# Patient Record
Sex: Female | Born: 1945 | Race: White | Hispanic: No | Marital: Married | Smoking: Never smoker
Health system: Southern US, Community
[De-identification: ages and names within clinical notes are randomized; demographics above are authoritative.]

## PROBLEM LIST (undated history)

## (undated) ENCOUNTER — Emergency Department (HOSPITAL_COMMUNITY): Admission: EM | Payer: Medicare Other | Source: Home / Self Care

## (undated) DIAGNOSIS — E785 Hyperlipidemia, unspecified: Secondary | ICD-10-CM

## (undated) DIAGNOSIS — E119 Type 2 diabetes mellitus without complications: Secondary | ICD-10-CM

## (undated) DIAGNOSIS — K219 Gastro-esophageal reflux disease without esophagitis: Secondary | ICD-10-CM

## (undated) DIAGNOSIS — E039 Hypothyroidism, unspecified: Secondary | ICD-10-CM

## (undated) DIAGNOSIS — R42 Dizziness and giddiness: Secondary | ICD-10-CM

## (undated) DIAGNOSIS — J45909 Unspecified asthma, uncomplicated: Secondary | ICD-10-CM

## (undated) DIAGNOSIS — I340 Nonrheumatic mitral (valve) insufficiency: Secondary | ICD-10-CM

## (undated) DIAGNOSIS — I10 Essential (primary) hypertension: Secondary | ICD-10-CM

## (undated) DIAGNOSIS — R519 Headache, unspecified: Secondary | ICD-10-CM

## (undated) DIAGNOSIS — T8859XA Other complications of anesthesia, initial encounter: Secondary | ICD-10-CM

## (undated) DIAGNOSIS — M1990 Unspecified osteoarthritis, unspecified site: Secondary | ICD-10-CM

## (undated) DIAGNOSIS — F32A Depression, unspecified: Secondary | ICD-10-CM

## (undated) DIAGNOSIS — I341 Nonrheumatic mitral (valve) prolapse: Secondary | ICD-10-CM

## (undated) DIAGNOSIS — J189 Pneumonia, unspecified organism: Secondary | ICD-10-CM

## (undated) DIAGNOSIS — F419 Anxiety disorder, unspecified: Secondary | ICD-10-CM

## (undated) DIAGNOSIS — Z9989 Dependence on other enabling machines and devices: Secondary | ICD-10-CM

## (undated) DIAGNOSIS — K7200 Acute and subacute hepatic failure without coma: Secondary | ICD-10-CM

## (undated) DIAGNOSIS — T4145XA Adverse effect of unspecified anesthetic, initial encounter: Secondary | ICD-10-CM

## (undated) DIAGNOSIS — F329 Major depressive disorder, single episode, unspecified: Secondary | ICD-10-CM

## (undated) DIAGNOSIS — D500 Iron deficiency anemia secondary to blood loss (chronic): Secondary | ICD-10-CM

## (undated) DIAGNOSIS — I4819 Other persistent atrial fibrillation: Secondary | ICD-10-CM

## (undated) DIAGNOSIS — K759 Inflammatory liver disease, unspecified: Secondary | ICD-10-CM

## (undated) DIAGNOSIS — G4733 Obstructive sleep apnea (adult) (pediatric): Secondary | ICD-10-CM

## (undated) DIAGNOSIS — R51 Headache: Secondary | ICD-10-CM

## (undated) DIAGNOSIS — N179 Acute kidney failure, unspecified: Secondary | ICD-10-CM

## (undated) HISTORY — DX: Nonrheumatic mitral (valve) insufficiency: I34.0

## (undated) HISTORY — DX: Iron deficiency anemia secondary to blood loss (chronic): D50.0

## (undated) HISTORY — DX: Type 2 diabetes mellitus without complications: E11.9

## (undated) HISTORY — PX: FRACTURE SURGERY: SHX138

## (undated) HISTORY — PX: JOINT REPLACEMENT: SHX530

## (undated) HISTORY — DX: Acute kidney failure, unspecified: N17.9

## (undated) HISTORY — DX: Hyperlipidemia, unspecified: E78.5

## (undated) HISTORY — PX: ABDOMINAL HYSTERECTOMY: SHX81

## (undated) HISTORY — PX: CHOLECYSTECTOMY: SHX55

## (undated) HISTORY — DX: Other persistent atrial fibrillation: I48.19

---

## 1981-11-17 HISTORY — PX: PARTIAL HYSTERECTOMY: SHX80

## 1993-11-17 HISTORY — PX: GALLBLADDER SURGERY: SHX652

## 1998-03-02 ENCOUNTER — Ambulatory Visit (HOSPITAL_COMMUNITY): Admission: RE | Admit: 1998-03-02 | Discharge: 1998-03-02 | Payer: Self-pay | Admitting: Family Medicine

## 1998-03-07 ENCOUNTER — Other Ambulatory Visit: Admission: RE | Admit: 1998-03-07 | Discharge: 1998-03-07 | Payer: Self-pay | Admitting: Family Medicine

## 1998-05-31 ENCOUNTER — Ambulatory Visit (HOSPITAL_COMMUNITY): Admission: RE | Admit: 1998-05-31 | Discharge: 1998-05-31 | Payer: Self-pay | Admitting: Family Medicine

## 1998-06-12 ENCOUNTER — Encounter: Admission: RE | Admit: 1998-06-12 | Discharge: 1998-09-10 | Payer: Self-pay | Admitting: Family Medicine

## 1999-01-09 ENCOUNTER — Encounter: Payer: Self-pay | Admitting: Internal Medicine

## 1999-01-09 ENCOUNTER — Inpatient Hospital Stay (HOSPITAL_COMMUNITY): Admission: EM | Admit: 1999-01-09 | Discharge: 1999-01-11 | Payer: Self-pay | Admitting: Internal Medicine

## 1999-01-09 ENCOUNTER — Encounter: Payer: Self-pay | Admitting: Orthopedic Surgery

## 1999-01-09 ENCOUNTER — Encounter: Payer: Self-pay | Admitting: Otolaryngology

## 1999-02-28 ENCOUNTER — Other Ambulatory Visit: Admission: RE | Admit: 1999-02-28 | Discharge: 1999-02-28 | Payer: Self-pay | Admitting: Family Medicine

## 2000-02-10 ENCOUNTER — Encounter (INDEPENDENT_AMBULATORY_CARE_PROVIDER_SITE_OTHER): Payer: Self-pay

## 2000-02-10 ENCOUNTER — Ambulatory Visit (HOSPITAL_COMMUNITY): Admission: RE | Admit: 2000-02-10 | Discharge: 2000-02-10 | Payer: Self-pay | Admitting: Gastroenterology

## 2000-04-15 ENCOUNTER — Encounter: Admission: RE | Admit: 2000-04-15 | Discharge: 2000-04-15 | Payer: Self-pay | Admitting: Family Medicine

## 2000-04-15 ENCOUNTER — Encounter: Payer: Self-pay | Admitting: Family Medicine

## 2000-05-04 ENCOUNTER — Other Ambulatory Visit: Admission: RE | Admit: 2000-05-04 | Discharge: 2000-05-04 | Payer: Self-pay | Admitting: Family Medicine

## 2001-02-19 ENCOUNTER — Encounter: Admission: RE | Admit: 2001-02-19 | Discharge: 2001-02-19 | Payer: Self-pay | Admitting: Family Medicine

## 2001-02-19 ENCOUNTER — Encounter: Payer: Self-pay | Admitting: Family Medicine

## 2001-05-11 ENCOUNTER — Other Ambulatory Visit: Admission: RE | Admit: 2001-05-11 | Discharge: 2001-05-11 | Payer: Self-pay | Admitting: Family Medicine

## 2002-03-30 ENCOUNTER — Encounter: Payer: Self-pay | Admitting: Family Medicine

## 2002-03-30 ENCOUNTER — Encounter: Admission: RE | Admit: 2002-03-30 | Discharge: 2002-03-30 | Payer: Self-pay | Admitting: Family Medicine

## 2002-04-26 ENCOUNTER — Other Ambulatory Visit: Admission: RE | Admit: 2002-04-26 | Discharge: 2002-04-26 | Payer: Self-pay | Admitting: Family Medicine

## 2002-09-16 ENCOUNTER — Encounter: Admission: RE | Admit: 2002-09-16 | Discharge: 2002-09-16 | Payer: Self-pay | Admitting: Family Medicine

## 2002-09-16 ENCOUNTER — Encounter: Payer: Self-pay | Admitting: Family Medicine

## 2003-04-21 ENCOUNTER — Encounter: Admission: RE | Admit: 2003-04-21 | Discharge: 2003-04-21 | Payer: Self-pay | Admitting: Family Medicine

## 2003-04-21 ENCOUNTER — Encounter: Payer: Self-pay | Admitting: Family Medicine

## 2003-04-28 ENCOUNTER — Other Ambulatory Visit: Admission: RE | Admit: 2003-04-28 | Discharge: 2003-04-28 | Payer: Self-pay | Admitting: Family Medicine

## 2003-11-13 ENCOUNTER — Emergency Department (HOSPITAL_COMMUNITY): Admission: EM | Admit: 2003-11-13 | Discharge: 2003-11-13 | Payer: Self-pay | Admitting: Emergency Medicine

## 2003-11-14 ENCOUNTER — Ambulatory Visit (HOSPITAL_COMMUNITY): Admission: RE | Admit: 2003-11-14 | Discharge: 2003-11-14 | Payer: Self-pay | Admitting: Orthopedic Surgery

## 2003-11-18 HISTORY — PX: NASAL SINUS SURGERY: SHX719

## 2003-11-18 HISTORY — PX: THROAT SURGERY: SHX803

## 2004-02-12 ENCOUNTER — Ambulatory Visit (HOSPITAL_BASED_OUTPATIENT_CLINIC_OR_DEPARTMENT_OTHER): Admission: RE | Admit: 2004-02-12 | Discharge: 2004-02-12 | Payer: Self-pay | Admitting: Orthopedic Surgery

## 2004-02-12 ENCOUNTER — Ambulatory Visit (HOSPITAL_COMMUNITY): Admission: RE | Admit: 2004-02-12 | Discharge: 2004-02-12 | Payer: Self-pay | Admitting: Orthopedic Surgery

## 2004-04-26 ENCOUNTER — Encounter: Admission: RE | Admit: 2004-04-26 | Discharge: 2004-04-26 | Payer: Self-pay | Admitting: Family Medicine

## 2004-05-01 ENCOUNTER — Other Ambulatory Visit: Admission: RE | Admit: 2004-05-01 | Discharge: 2004-05-01 | Payer: Self-pay | Admitting: Family Medicine

## 2004-09-11 ENCOUNTER — Inpatient Hospital Stay (HOSPITAL_COMMUNITY): Admission: RE | Admit: 2004-09-11 | Discharge: 2004-09-16 | Payer: Self-pay | Admitting: Orthopedic Surgery

## 2004-09-11 ENCOUNTER — Ambulatory Visit: Payer: Self-pay | Admitting: Physical Medicine & Rehabilitation

## 2004-10-22 ENCOUNTER — Emergency Department (HOSPITAL_COMMUNITY): Admission: EM | Admit: 2004-10-22 | Discharge: 2004-10-22 | Payer: Self-pay | Admitting: Emergency Medicine

## 2005-04-30 ENCOUNTER — Encounter: Admission: RE | Admit: 2005-04-30 | Discharge: 2005-04-30 | Payer: Self-pay | Admitting: Family Medicine

## 2005-05-06 ENCOUNTER — Other Ambulatory Visit: Admission: RE | Admit: 2005-05-06 | Discharge: 2005-05-06 | Payer: Self-pay | Admitting: Family Medicine

## 2006-03-14 ENCOUNTER — Ambulatory Visit (HOSPITAL_BASED_OUTPATIENT_CLINIC_OR_DEPARTMENT_OTHER): Admission: RE | Admit: 2006-03-14 | Discharge: 2006-03-14 | Payer: Self-pay | Admitting: Otolaryngology

## 2006-03-22 ENCOUNTER — Ambulatory Visit: Payer: Self-pay | Admitting: Internal Medicine

## 2006-04-21 ENCOUNTER — Ambulatory Visit (HOSPITAL_COMMUNITY): Admission: RE | Admit: 2006-04-21 | Discharge: 2006-04-21 | Payer: Self-pay | Admitting: *Deleted

## 2006-05-06 ENCOUNTER — Ambulatory Visit (HOSPITAL_COMMUNITY): Admission: RE | Admit: 2006-05-06 | Discharge: 2006-05-07 | Payer: Self-pay | Admitting: Otolaryngology

## 2006-05-06 ENCOUNTER — Encounter (INDEPENDENT_AMBULATORY_CARE_PROVIDER_SITE_OTHER): Payer: Self-pay | Admitting: *Deleted

## 2006-06-05 ENCOUNTER — Encounter: Admission: RE | Admit: 2006-06-05 | Discharge: 2006-06-05 | Payer: Self-pay | Admitting: Family Medicine

## 2006-10-09 ENCOUNTER — Encounter: Admission: RE | Admit: 2006-10-09 | Discharge: 2006-10-09 | Payer: Self-pay | Admitting: Family Medicine

## 2006-12-01 ENCOUNTER — Encounter: Admission: RE | Admit: 2006-12-01 | Discharge: 2006-12-01 | Payer: Self-pay | Admitting: Family Medicine

## 2007-02-10 ENCOUNTER — Ambulatory Visit (HOSPITAL_COMMUNITY): Admission: RE | Admit: 2007-02-10 | Discharge: 2007-02-10 | Payer: Self-pay | Admitting: Orthopedic Surgery

## 2007-06-08 ENCOUNTER — Encounter: Admission: RE | Admit: 2007-06-08 | Discharge: 2007-06-08 | Payer: Self-pay | Admitting: Family Medicine

## 2007-11-03 ENCOUNTER — Encounter: Admission: RE | Admit: 2007-11-03 | Discharge: 2008-02-01 | Payer: Self-pay | Admitting: Family Medicine

## 2008-04-05 ENCOUNTER — Encounter: Admission: RE | Admit: 2008-04-05 | Discharge: 2008-04-05 | Payer: Self-pay | Admitting: Family Medicine

## 2008-06-08 ENCOUNTER — Ambulatory Visit (HOSPITAL_COMMUNITY): Admission: RE | Admit: 2008-06-08 | Discharge: 2008-06-08 | Payer: Self-pay | Admitting: Family Medicine

## 2008-08-23 ENCOUNTER — Encounter: Admission: RE | Admit: 2008-08-23 | Discharge: 2008-10-16 | Payer: Self-pay | Admitting: Family Medicine

## 2008-10-25 ENCOUNTER — Encounter: Admission: RE | Admit: 2008-10-25 | Discharge: 2008-10-25 | Payer: Self-pay | Admitting: Family Medicine

## 2009-06-11 ENCOUNTER — Encounter: Admission: RE | Admit: 2009-06-11 | Discharge: 2009-06-11 | Payer: Self-pay | Admitting: Family Medicine

## 2010-07-23 ENCOUNTER — Encounter: Admission: RE | Admit: 2010-07-23 | Discharge: 2010-07-23 | Payer: Self-pay | Admitting: Family Medicine

## 2011-01-27 ENCOUNTER — Other Ambulatory Visit: Payer: Self-pay | Admitting: Orthopedic Surgery

## 2011-01-27 DIAGNOSIS — M25572 Pain in left ankle and joints of left foot: Secondary | ICD-10-CM

## 2011-01-31 ENCOUNTER — Ambulatory Visit
Admission: RE | Admit: 2011-01-31 | Discharge: 2011-01-31 | Disposition: A | Discharge status: Home / Self Care | Payer: 59 | Source: Ambulatory Visit | Attending: Orthopedic Surgery | Admitting: Orthopedic Surgery

## 2011-01-31 DIAGNOSIS — M25572 Pain in left ankle and joints of left foot: Secondary | ICD-10-CM

## 2011-04-04 NOTE — Op Note (Signed)
NAME:  Amanda [name], Amanda [name]                         ACCOUNT NO.:  [id number]   MEDICAL RECORD NO.:  [id number]                   PATIENT TYPE:  AMB   LOCATION:  DSC                                  FACILITY:  [facility]   PHYSICIAN:  Feliberto Gottron. Turner Daniels, M.D.                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   PROCEDURE:  Left knee arthroscopic partial medial and lateral  meniscectomies, removal of numerous loose bodies and debridement of  chondromalacia.   SURGEON:  Feliberto Gottron. Turner Daniels, M.D.   FIRST ASSISTANT:  Skip Mayer, PA-C.   ANESTHESIA:  Local with IV sedation.   ESTIMATED BLOOD LOSS:  Minimal.   FLUIDS REPLACED:  One liter of crystalloid.   DRAINS:  Placed.   TOURNIQUET TIME:  None.   INDICATIONS FOR PROCEDURE:  The patient is a 65 year old woman with sharp  popping, catching and extreme pain in her left knee.  Back in January she  stood up and had extreme pain in her left knee with popping and pain.  She  had a Doppler done for DVT's and had an MRI scan which showed  tricompartmental degenerative changes mostly in the patellofemoral joint  space as well as a small collection of fluid above the knee.  Because of  persistent mechanical symptoms she is taken for arthroscopic evaluation and  treatment of her right knee.  Most of her pain is along the medial  compartment.   DESCRIPTION OF PROCEDURE:  The patient was identified by her arm band and  taken to the operating room at [facility], the  appropriate  anesthetic monitors were attached and local anesthesia with IV sedation was  induced into the left knee.  A lateral post was applied to the table and the  left lower extremity prepped and draped in the usual sterile fashion from  the ankle to the mid thigh.  Using a #11 blade, standard inferomedial and  inferolateral parapatellar portals were then made allowing introduction of  the arthroscope through the inferolateral portal and the outflow in through  the inferomedial portal.  Grade 2 chondromalacia of the patella was noted,  grade 3 focal of the trochlea and this was debrided back to a stable margin  with a 3.5 Gator sucker shaver.  Multiple cartilaginous loose bodies were  noted to be floating around in the synovial fluid and these were taken  through a large outflow intermittently during the case.  Moving into the  medial compartment, minimal tearing of the medial meniscus was noted  primarily degenerative and this was debrided back to a stable margin.  Grade  3 chondromalacia of the medial femoral condyle and medial tibial condyle was  also noted and debrided.  The ACL and the PCL were intact on the lateral  side.  The lateral tibial plateau near the spine was grade 4 chondromalacia  and grade 3 global to the lateral tibial condyle. A flap tear of the  posterior horn of the lateral meniscus was noted and debrided with a 3.5  Gator sucker shaver as well as a straight biters.  Again, more cartilaginous  loose bodies were noted and removed and photographic documentation was made  of this.  The gutters were cleared.  The scope was taken medial and lateral  to the PCL clearing the posterior compartment.  The knee was irrigated out  with normal saline solution.  The arthroscopic instruments were removed and  a dressing of Xeroform, 4 x 4 dressing sponges, Webril and an ace wrap were  applied.  The patient was then awakened and taken to the recovery room  without  difficulty.                                               Feliberto Gottron. Turner Daniels, M.D.    Ovid Curd  D:  02/12/2004  T:  02/12/2004  Job:  [id number]

## 2011-04-04 NOTE — Op Note (Signed)
NAMESELDA, JALBERT               ACCOUNT NO.:  [id number]   MEDICAL RECORD NO.:  [id number]          PATIENT TYPE:  INP   LOCATION:  NA                           FACILITY:  [facility]   PHYSICIAN:  Feliberto Gottron. Turner Daniels, M.D.   DATE OF BIRTH:  12-17-1945   DATE OF PROCEDURE:  DATE OF DISCHARGE:                                 OPERATIVE REPORT   Audio too short to transcribe (less than 5 seconds)       FJR/MEDQ  D:  09/11/2004  T:  09/11/2004  Job:  [id number]

## 2011-04-04 NOTE — Op Note (Signed)
Amanda [name], Amanda [name] NO.:  [id number]   MEDICAL RECORD NO.:  [id number]          PATIENT TYPE:  OIB   LOCATION:  2550                         FACILITY:  [facility]   PHYSICIAN:  Hermelinda Medicus, M.D.   DATE OF BIRTH:  02/01/1946   DATE OF PROCEDURE:  05/06/2006  DATE OF DISCHARGE:                                 OPERATIVE REPORT   PREOPERATIVE DIAGNOSES:  1.  Sleep apnea with a septal deviation.  2.  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   POSTOPERATIVE DIAGNOSIS:  1.  Sleep apnea with a septal deviation.  2.  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   OPERATION:  A septal reconstruction, turbinate reduction and a laser  assisted uvulopalatoplasty.   ANESTHESIA:  Local anesthesia MAC.   SURGEON:  Dr. Hermelinda Medicus,   DESCRIPTION OF PROCEDURE:  Patient placed in supine position and under local  MAC anesthesia, 1% Xylocaine with epinephrine 6 mL and topical cocaine 200  mg, after prepping and draping, the nose was first approached under local  anesthesia.  Once this was completed a hemitransfixion incision was made on  the right side, carried around the columella to the left and back along the  floor of the nose where the Glorious Peach was used to remove a strip of cartilage  that was essentially off the premaxillary crest to the left side.  Once this  was removed, the septum was brought back to the midline in this region.  It  extended back vomerine septum and a 4-mm chisel was used as well as the open  and close Laren Boom to correct this.  Attention was then carried to  the posterior quadrilateral cartilage superiorly where a strip of cartilage  was taken more superiorly than the ethmoid septum which was off to the right  side was then corrected by using the open and close Laren Boom.  Once  the ethmoid septum was corrected, then the septum was established  in the  midline and closure was begun using 5-0 plain catgut and through-and-through  septal suture using four plain x2.  The Elmed set at 12 was then used to  cauterize the inferolateral portion of the inferior turbinates in an effort  to shrink the membrane which was quite edematous.  The inferior turbinates  were also outfractured aggressively to make sure we could gain all the space  we possibly could in this narrow nose.  Once this was completed, then the  oxygen was discontinued and the oral cavity was visualized and the 1%  Xylocaine with epinephrine was used to anesthetize the palate as well as  topical lidocaine.  Once this was completed, then the glasses were applied.  The patient's face and eyes were totally protected with wet sponges and  towel and the lesion was set at 10 watts for 5 minutes continuous.  Lateral  superior laser cuts were made each side of the uvula and once these  incisions were made, then the redundant  uvula was also corrected by again  using the laser.  A laser was used for 5 minutes.  The patient tolerated the  procedure very well.  Airway was markedly improved and then on the right  side we placed a Merocel pack 8 cm and on the left side we placed a 7.5  anesthesia trumpet.  The patient tolerated procedure well and was doing well  postoperatively.   FOLLOW UP:  She will be kept overnight for observation for monitoring of her  sleep status and then follow-up will be in 5 days, 2 weeks, 4 weeks and 6  weeks and then 3 months and 6 months.           ______________________________  Hermelinda Medicus, M.D.     JC/MEDQ  D:  05/06/2006  T:  05/06/2006  Job:  [id number]   cc:   Quita Skye. Artis Flock, M.D.  Fax: [phone]   Elmore Guise., M.D.  Fax: [phone]

## 2011-04-04 NOTE — H&P (Signed)
NAMEGAY, MONCIVAIS NO.:  [id number]   MEDICAL RECORD NO.:  [id number]          PATIENT TYPE:  OIB   LOCATION:  2550                         FACILITY:  [facility]   PHYSICIAN:  Hermelinda Medicus, M.D.   DATE OF BIRTH:  1946-09-10   DATE OF ADMISSION:  05/06/2006  DATE OF DISCHARGE:                                HISTORY & PHYSICAL   TWENTY-THREE-HOUR OBSERVATION NOTE:   HISTORY OF PRESENT ILLNESS:  This patient is a 65 year old female who has  had persistent sleep apnea issues.  She has got a very narrow nose,  turbinate hypertrophy with very poor nasal breathing and has a very low  uvula and palate.  She has also been seen under my care for persistent  sinusitis problems, but after treatment with full rounds of antibiotics and  decongestants including Avelox and Levaquin, she had a negative CT scan, but  did show the septal deviation blocking her nose.  Her sleep study reveals a  RDI of 55 and the lowest O2 nadir was 85%.  She will not work with a CPAP,  says she cannot work with it and now enters for a septal reconstruction and  turbinate reduction with an LAUP planned under local anesthesia.  She has  had a complete evaluation of her cardiovascular status and it is found to be  in excellent condition.  She ended up with a abnormal stress test with  reversible anterior wall defect; therefore, she had a cardiac  catheterization which essentially the left main was normal.  Their  impression was normal-appearing coronary arteries, normal left ventricular  systolic function with ejection fraction of 55% to 60% and a left-dominant  system.  Once she was cleared for surgery, she now enters for a septal  reconstruction and turbinate reduction with a laser-assisted  uvulopalatoplasty.   MEDICATIONS:  Her medications are listed and are:  1.  Toprol-XL 50 mg half a tablet daily.  2.  Altace 2.5 mg daily.  3.  Avandia 8 mg daily.  4.  Hydrochlorothiazide 25 mg daily.  5.   Crestor 10 mg.  6.  Premarin 0.625 mg.  7.  Paxil CR 25 mg.  8.  Zyrtec 10 mg.  9.  Multivitamins.  10. Glucosamine chondroitin two pills.  11. Tylenol No. 3 p.r.n.  12. Tramadol 15 mg p.r.n. daily.   PAST HISTORY:   ALLERGIES:  Essentially GI intolerance -- MACRODANTIN, E-MYCIN and ASA and  NONSTEROIDAL ANTI-INFLAMMATORIES.   PAST SURGICAL HISTORY:  Her surgery has been one of hysterectomy, left TKA  in October 2005 and a broken leg treated at [facility] in 2000.  She also  had a laparoscopic cholecystectomy.   MEDICAL ILLNESSES:  She had diabetes diagnosed in 1999, hiatal hernia with  reflux, I am sure aggravated by sleep apnea and she works with elderly  patient and there is a concern of a staphylococcal infection.   PHYSICAL EXAMINATION:  VITAL SIGNS:  Blood pressure of 133/73, pulse 68.  She weighs 271, is 5 feet 4-1/2.  HEENT:  Her ears are clear.  Tympanic membranes are clear.  Her nose has  severe septal deviation off the premaxillary crest the left and then in the  ethmoid, back to the right.  Her oral cavity is very small.  Her tonsils are  small, but her uvula is large and her palate is extremely low and her  oropharynx is shallow and narrow.  Larynx is clear, however, with true  cords, false cords, epiglottis and base of tongue clear.  True cord  mobility, gag reflex intact, but EOMs and facial nerve are all symmetrical.  NECK:  Her neck is free of any thyromegaly, cervical adenopathy or mass.  CHEST:  Clear.  No rales, rhonchi or wheezes.  CARDIOVASCULAR:  No opening snaps, murmurs or gallops.  ABDOMEN:  Unremarkable.  EXTREMITIES:  Above-mentioned surgeries.   INITIAL DIAGNOSES:  1.  Sleep apnea with septal deviation, with intolerance of continuous      positive airway pressure.  2.  History of a clear cardiac catheterization with a right-sided dominance      of 55% to 60% ejection fraction.  3.  Left total knee arthroplasty in October 2005.  4.  Fractured  leg in 2000.  5.  Laparoscopic cholecystectomy and hysterectomy.  6.  Intolerance to Macrodantin, erythromycin and aspirin and nonsteroidal      antiinflammatories.  7.  Diabetes, on diabetic diet.           ______________________________  Hermelinda Medicus, M.D.     JC/MEDQ  D:  05/06/2006  T:  05/06/2006  Job:  [id number]   cc:   Elmore Guise., M.D.  Fax: [phone]   Quita Skye. Artis Flock, M.D.  Fax: [phone]

## 2011-04-04 NOTE — Cardiovascular Report (Signed)
[name], [name] NO.:  [id number]   MEDICAL RECORD NO.:  [id number]          PATIENT TYPE:  OIB   LOCATION:  2899                         FACILITY:  [facility]   PHYSICIAN:  Elmore Guise., M.D.DATE OF BIRTH:  1946/10/21   DATE OF PROCEDURE:  04/21/2006  DATE OF DISCHARGE:  04/21/2006                              CARDIAC CATHETERIZATION   INDICATIONS FOR PROCEDURE:  Preoperative evaluation and abnormal stress test  with a reversible anterior wall defect.   DESCRIPTION OF PROCEDURE:  The patient was brought to the cardiac  catheterization lab after appropriate informed consent.  She was prepped and  draped in sterile fashion.  Approximately 25 mL of 1% lidocaine was used for  local anesthesia.  Her right femoral artery was difficult to access.  The  right femoral vein was accessed, and a 5-French sheath was placed in the  vein as a landmark.  A Smart needle was used to access the right femoral  artery which showed good pulsatile flow.  A 6-French sheath was then placed  in the right femoral artery without difficulty.  Coronary angiography, LV  angiography, limited right femoral angiography were then performed.  The  patient tolerated the procedure well.  No apparent complications.  She was  transferred from the cardiac catheterization lab in stable condition.   FINDINGS:  1.  Left Main:  Normal.  2.  LAD:  Mild luminal irregularities.  3.  Diagonal-1:  Moderate size with mild luminal irregularities.  4.  Left circumflex:  Dominant with mild luminal irregularities.  5.  OM-1/OM-2/OM-3:  Moderate to large vessels with mild luminal      irregularities.  6.  Ramus intermedius:  Moderate to large vessel with mild luminal      irregularities.  7.  RCA:  A small nondominant with mild luminal irregularities.  8.  LV:  EF of 55-60%.  No wall motion abnormalities.  LV EDP was 21 mmHg.  9.  Limited right femoral angiogram showed no significant disease. Closure  device was not deployed because of multiple attempts at access.   IMPRESSION:  1.  Normal-appearing coronary arteries.  2.  Normal left ventricular systolic function with an ejection fraction of      55-60%.  3.  Left dominant system.   PLAN:  Aggressive medical therapy.  No further cardiac workup needed prior  to her upcoming surgery.  She is moderate risk by her comorbidities alone.  However, she has normal-appearing coronaries and should tolerate the  procedure well.      Elmore Guise., M.D.  Electronically Signed     TWK/MEDQ  D:  04/21/2006  T:  04/21/2006  Job:  [id number]   cc:   Hermelinda Medicus, M.D.  Fax: [phone]

## 2011-04-04 NOTE — Procedures (Signed)
NAME:  Amanda [name], Amanda [name] NO.:  [id number]   MEDICAL RECORD NO.:  [id number]          PATIENT TYPE:  OUT   LOCATION:  SLEEP CENTER                 FACILITY[facility]  [facility]   PHYSICIAN:  Clinton D. Maple Hudson, M.D. DATE OF BIRTH:  [birth year]   DATE OF STUDY:  03/14/2006                              NOCTURNAL POLYSOMNOGRAM   REFERRING PHYSICIAN:  Dr. Hermelinda Medicus.   INDICATIONS FOR STUDY:  Insomnia with sleep apnea.   EPWORTH SLEEPINESS SCORE:  11/24, BMI 44.  Weight 260 pounds.   MEDICATIONS:  Toprol XL, Altace, Avandia, hydrochlorothiazide, Crestor,  Premarin, Paxil, Zyrtec, Tylenol #3 or tramadol.   SLEEP ARCHITECTURE:  Total sleep time 303 minutes with sleep efficiency 76%.  Stage 1 was 19%, stage 2 66%, stages 3 and 4, 4%, REM 11% of total sleep  time.  Sleep latency 43 minutes, REM latency 318 minutes, awake after sleep  onset 51 minutes, arousal index 24.5.   RESPIRATORY DATA:  NPSG protocol ordered.  Apnea/hypopnea index (AHI, RDI)  55 obstructive events per hour indicating severe obstructive sleep  apnea/hypopnea syndrome.  This included 111 obstructive apneas and 167  hypopneas.  Events were not positional.  REM AHI 57.4 per hour.   OXYGEN DATA:  Mild to moderate snoring with oxygen desaturation to a nadir  of 85%.  Mean oxygen saturation through the study was 93% on room air.   CARDIAC DATA:  Normal sinus rhythm.   MOVEMENT/PARASOMNIA:  Frequent limb jerks but few were associated with  arousal or awakening.   IMPRESSION/RECOMMENDATIONS:  1.  Severe obstructive sleep apnea/hypopnea syndrome, AHI 55 per hour with      nonpositional events, mild to moderate snoring and oxygen desaturation      to a nadir of 85%.  2.  Periodic limb movement was noted but with relatively few associated      arousals, 1.6 per hour.  3.  Consider return for CPAP titration or evaluate for alternative therapies      as appropriate.      Clinton D. Maple Hudson, M.D.  Diplomate,  Biomedical engineer of Sleep Medicine  Electronically Signed     CDY/MEDQ  D:  03/22/2006 10:52:01  T:  03/23/2006 12:36:14  Job:  [id number]

## 2011-04-04 NOTE — Op Note (Signed)
NAMEMILLEY, VINING NO.:  [id number]   MEDICAL RECORD NO.:  [id number]          PATIENT TYPE:  INP   LOCATION:  2899                         FACILITY:  [facility]   PHYSICIAN:  Feliberto Gottron. Turner Daniels, M.D.   DATE OF BIRTH:  01/07/1946   DATE OF PROCEDURE:  09/11/2004  DATE OF DISCHARGE:                                 OPERATIVE REPORT   PREOPERATIVE DIAGNOSIS:  Degenerative arthritis of the left knee, proven by  arthroscopy.   POSTOPERATIVE DIAGNOSIS:  Degenerative arthritis of the left knee, proven by  arthroscopy.   PROCEDURE:  Left total knee arthroplasty using Osteonics Scorpio components:  7 femur, 5 tibia, 12 spacer, 5 modified medial patellar button; all  components cemented, Palacos cement, double batch with 1500 mg of Zinacef.   SURGEON:  Feliberto Gottron. Turner Daniels, M.D.   FIRST ASSISTANT:  __________ Almira Bar.   ANESTHETIC:  General endotracheal.   ESTIMATED BLOOD LOSS:  Minimal.   FLUID REPLACEMENT:  A liter of crystalloids.   DRAINS:  2 Hemovacs and a Foley catheter.   URINE OUTPUT:  300 cc.   TOURNIQUET TIME:  1 hour and 20 minutes.   INDICATIONS FOR PROCEDURE:  Patient is a 65 year old woman with end-stage  arthritis of the left knee.  She has undergone conservative treatment with  antiinflammatory medicines, physical therapy, cortisone injections and most  recently arthroscopy which documented grade 4 changes to the medial and  patellofemoral compartments of her left knee.  Because of unremitting pain  she desires total knee arthroplasty.  She is well aware of the risk and  benefits of surgery.   DESCRIPTION OF PROCEDURE:  The patient identified by arm band, taken to the  operating room at [facility].  Appropriate and __________ monitors  were attached and general endotracheal anesthesia induced with the patient  in the supine position.  Tourniquet applied high to the left thigh and the  left lower extremity prepped and draped in the usual  sterile fashion from  the ankle to the tourniquet after placement of a foot positioner under the  lateral toes.  Limb wrapped with an esmarch bandage, tourniquet inflated to  400 mmHg.  Anterior midline incision then made starting 10 cm above the  patella and going to 8 cm below the patella.  Bleeders in the skin and  subcutaneous tissue identified and cauterized.  Medial parapatellar  arthrotomy accomplished after reflecting the transverse retinaculum medially  and laterally.  Because of the patient's obesity the patella was everted and  tucked underneath the adipose tissue laterally and the knee hyperflexed  exposing the prepatellar fat pad which was resected as well as the anterior  half of the medial and lateral meniscus.  The superficial medial-collateral  ligament was elevated from anterior-to-posterior going around the proximal  tibia and leaving it intact distally allowing placement of a posterior  medial Z-retractor.   The ACL and the PCL removed from the notch with the electrocautery and a  McHale retractor placed translating the tibia anteriorly and finally a  lateral Hohmann retractor was placed exposing the proximal tibia which  was  then instrumented proximally with the Osteonic step drill in the center  followed by an intramedullary rod and a 0-degree posterior slip cutting  guide.  Standard tibial cut was then accomplished after pinning the guide in  place allowing removal of about 7 mm of bone medially and 10 mm of bone and  cartilage laterally.  Satisfied with the proximal tibia resection we then  directed our attention to the distal femur and I entered it 2 mm anterior to  the PCL origin with the step drill followed by the IM rod and the 5-degree  left distal femoral cutting guide.   Having accomplished the distal femoral cut, at 10 mm after pinning the guide  into place, we then sized for a #7 femoral component and pinned the cutting  guide in place in 0-degrees of  rotation.  The anterior, posterior, and  champher cuts were then accomplished without difficulty followed by the  Osteonics, Scorpio box cut.  The everted patella was then grasped with the  patellar cutting guide; the posterior 9 mm resected.  We sized for a #5,  modified medial patellar button with a 5 lollipop and drilled the patella.  We then performed a trial with a 7 left femoral component.  A 5 tibial  baseplate with 10 and 12-mm trial spacers.  The 12 had the best fit and  ligamentous tension.  We set the rotation of the tibial baseplate so that it  was over the second metatarsal and scored the proximal tibia.  This was done  with the electrocautery. The trial patellar button was placed; the knee  taken through a range of motion and no lateral tracking noted; and the knee  was quite stable.   At this point the knee was, once again, flexed up and the trial components  removed except for the tibial baseplate which was then pinned in the correct  rotation for the best coverage over the tibia, followed by the delto fit  keel punches up to a 35 cemented delto fit keel. At this point all bony  surfaces were waterpicked clean, dried with suction and sponges and a double  bath of Palacos polymethyl methacrylate cement with 1500 mg of Zinacef was  then mixed and applied to all bony and metallic surfaces except for the  posterior condyles of the femur itself.  In order we hammered into place a 5  tibial baseplate, a 7 left femoral component, and a 5 modified medial  patellar button and excess cement was removed after each component was  placed.   The knee was then reduced to 0 degrees and held at 5 degrees flexion with  compression.  A clamp was placed on the patella and the cement allowed to  secure.  Medium Hemovacs were placed laterally in the wound.  Patellar clamp  was removed.  We once again checked stability to look for any small bits of cement that could be removed from the  periphery of the implants and the  wound once again, irrigated out with normal saline solution.  Parapatellar  arthrotomy was then closed with running #1 Vicryl suture.  The subcutaneous  tissue with #0 and 2-0 undyed Vicryl suture and the skin with skin staples.  A dressing of Xeroform, 4 x 4 dressing, sponges, Webril, and an Ace wrap  were applied.  The tourniquet was let down.  The patient awakened and taken  to the recovery room without difficulty.      Drenda Freeze  FJR/MEDQ  D:  09/11/2004  T:  09/11/2004  Job:  [id number]

## 2011-04-04 NOTE — H&P (Signed)
NAMETENELLE, ANDREASON NO.:  [id number]   MEDICAL RECORD NO.:  [id number]           PATIENT TYPE:   LOCATION:                                 FACILITY:   PHYSICIAN:  Elmore Guise., M.D.DATE OF BIRTH:  1945/12/29   DATE OF ADMISSION:  04/21/2006  DATE OF DISCHARGE:                                HISTORY & PHYSICAL   INDICATION FOR ADMISSION:  Abnormal stress test for elective cardiac  catheterization with possible intervention.   HISTORY OF PRESENT ILLNESS:  The patient is a very pleasant 65 year old  white female with a past medical history of diabetes mellitus, hypertension,  dyslipidemia, obesity, and osteoarthritis (status post left total knee  replacement) who presented for preoperative evaluation initially on Apr 06, 2006.  At that time, the patient reports normal state of health, however,  she was having increasing difficulty with chronic fatigue, sleep problems,  and dyspnea.  She was being worked up for obstructive sleep apnea and was  found to have significant nasal obstruction and was planned to have nasal  and soft palate surgery.  She reports that she had just been too short of  breath to anything.  She denied any chest pain, presyncope or syncope.  No  palpitations.  She states her weight has been stable but heavy for some  time.  In her preoperative workup, she had an echocardiogram which showed a  normal LV systolic function with an EF of 55-60%.  She had trileaflet aortic  valve, with mild aortic valve sclerosis, and trivial aortic valve  insufficiency.  She had mild diastolic dysfunction and mild mitral and  trivial tricuspid valve regurgitation.  This was followed by an adenosine  Cardiolite which showed a reversible anterior wall defect with normal LV  systolic function and EF of 60%.   Her current medications include:  1.  Toprol XL 50 mg, a 1/2 p.o. daily.  2.  Altace 2.5 mg daily.  3.  Avandia 8 mg daily.  4.  Hydrochlorothiazide  25 mg daily.  5.  Crestor 10 mg daily.  6.  Premarin 0.625 mg daily.  7.  Paxil CR 25 mg daily.  8.  Zyrtec 10 mg daily.  9.  Multivitamin once daily.  10. Glucosamine chondroitin 2 pills daily.  11. Tylenol #3 p.r.n.  12. Tramadol 50 mg p.r.n.   ALLERGIES:  1.  ERYTHROMYCIN.  2.  MACRODANTIN.  3.  NONSTEROIDAL'S.   Questionable past allergy to ASPIRIN causing problems with ulcers.Marland Kitchen   FAMILY HISTORY:  Is positive for stroke, diabetes, hypertension, mitral  valve prolapse.   SOCIAL HISTORY:  She is married.  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  Exercises by treading water approximately twice a week.  No tobacco, no  alcohol does drink four caffeinated beverages daily.   PAST SURGICAL HISTORY:  1.  Total hysterectomy.  2.  Left total knee replacement.  3.  Right leg surgery secondary to a fracture.  4.  A cholecystectomy in the past.   PHYSICAL EXAMINATION:  VITAL SIGNS:  Her weight is 271 pounds.  Her  blood  pressure is 120/70.  Her heart rate is 65 and regular.  GENERAL:  She is a very pleasant, middle-aged, white female alert and  oriented  x4 in no acute distress.  NECK:  Supple.  No lymphadenopathy.  Carotids 2+.  No JVD and no bruits.  LUNGS:  Clear.  HEART:  Regular with a normal S1-S2.  ABDOMEN:  Soft, nontender, nondistended.  No rebound or guarding.  EXTREMITIES:  Warm with trace pretibial edema and 2+ pulses noted in her  femoral and pedal arteries.   Her blood work today showed a PT of 12.2, INR of 0.9, PTT of 33.6.  Her BUN  and creatinine are 21 and 0.8 with a potassium of 4.2.  Her blood count  shows a white blood cell count of 5.6, hemoglobin of 12.2, and platelet  count of 279.  Her stress test did show a reversible anterior wall defect  with normal LV function.   IMPRESSION:  History of multiple cardiac risk factors now with abnormal  stress test for preoperative evaluation with continued dyspnea.   PLAN:  1.  I discussed risks and benefits of cardiac  catheterization with possible      intervention with her at length.  2.  She is on an aspirin 81 mg daily and seems to be tolerating that very      well.  3.  I have asked her to stop her Avandia at this time.  4.  Should we have to do stenting to any of her arteries, her surgery would      be delayed at least 1 month, preferably up to 3 months.  She understands      this and is willing to proceed.   Her procedure is scheduled for June5, 2007.  She is to notify should she  have any problems prior to that time.  I did give her samples of Actos to  take instead of Avandia because of the recent data regarding difficulty with  increased risk of heart attack and death with Avandia.      Elmore Guise., M.D.  Electronically Signed     TWK/MEDQ  D:  04/17/2006  T:  04/17/2006  Job:  [id number]   cc:   Hermelinda Medicus, M.D.  Fax: [phone]

## 2011-04-04 NOTE — Discharge Summary (Signed)
[name], BAUMBACH NO.:  [id number]   MEDICAL RECORD NO.:  [id number]          PATIENT TYPE:  INP   LOCATION:  4705                         FACILITY:  [facility]   PHYSICIAN:  Feliberto Gottron. Turner Daniels, M.D.   DATE OF BIRTH:  July 02, 1966   DATE OF ADMISSION:  09/11/2004  DATE OF DISCHARGE:  09/16/2004                                 DISCHARGE SUMMARY   PRIMARY DIAGNOSIS FOR THIS ADMISSION:  End-stage degenerative joint disease  of the left knee.   PROCURE IN-HOSPITAL:  Left total knee arthroplasty.   SECONDARY DIAGNOSES:  1.  Hypertension.  2.  Diabetes.  3.  Depression.  4.  Anemia.  5.  Peptic ulcer disease.   HISTORY OF PRESENT ILLNESS:  Patient is a 65 year old woman with end-stage  osteoarthritis of the left knee. She had undergo conservative treatment with  anti-inflammatory medications, physical therapy, cortisone injections and  most recently arthroscopy which documented  grade 4 changes in the medial  patellofemoral compartment of the left knee. Because of unremitting pain,  she desires total knee arthroplasty. She is well aware of the risks and  benefits prior to surgery.   ALLERGIES:  ASPIRIN, ERYTHROMYCIN, MACRODANTIN.   MEDICATIONS ON ADMISSION:  1.  Toprol  XL 50 mg 1/2 pill daily.  2.  Altace 2.5 mg daily.  3.  Avandia 8 mg 1 p.o. daily.  4.  Hydrochlorothiazide 25 mg 1/2 tablet p.o.       daily.  5.  Lipitor 10 mg 1 p.o. daily.  6.  Premarin 0.625 mg p.o. daily.  7.  Paxil CR 25 mg 1 p.o. daily.  8.  Multivitamins.  9.  Occasional Tylenol No. 3 or Xanax.  10. Rarely takes doxazosin 500 mg.   PAST MEDICAL HISTORY:  Usual childhood diseases.  Adult:  Hypertension,  diabetes, depression, anemia, peptic ulcer disease.   PAST SURGICAL HISTORY:  Knee scoped 2005.  ORIF of the leg 2000. Gallbladder  in '95.  Hysterectomy in 1983. __________   SOCIAL HISTORY:  No tobacco, no ethanol, no IV drug abuse.  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   FAMILY HISTORY:  Father died at age 24 secondary to CVA and diabetes.  Mother alive at age 78 with history of hypertension, DJD.   REVIEW OF SYSTEMS:  Positive for glasses, GI irregularity.  Denies any  shortness of breath, chest pain or recent illness.   PHYSICAL EXAMINATION:  VITAL SIGNS:  Temperature 95.6, pulse 70,  respirations 16, blood pressure 130/82.  Patient is 5 feet, 4-1/2, 254  pounds.  HEENT:  Head is normocephalic, atraumatic. Ears: TMs clear.  Eyes:  Pupils  are equal, round and reactive to light.  Nose and throat:  Benign.  NECK:  Supple, full range of motion.  LUNGS:  Clear to auscultation and auscultation and percussion.  CARDIOVASCULAR:  Regular rate and rhythm.  ABDOMEN:  Soft, no masses, bowel sounds 2+.  EXTREMITIES:  Within normal limits with the exception of left knee which has  a 1+ crepitus, range of motion 0-100 degrees, neurovascularly intact.  SKIN:  Well-healed normal scars following arthroscopy.   X-rays show loss of articular cartilage and grade 4 by arthroscopy pictures.   LABORATORY DATA:  Preoperative labs including CBC, CMET, chest x-ray, EKG,  PT/PTT are all within normal limits with the exception of hemoglobin 11.8,  hematocrit 34.7, glucose 108.   HOSPITAL COURSE:  On the day of admission, the patient was taken to the  Operating Room at [facility] where she underwent a left total knee  arthroplasty using Osteonics __________  #7 femur, #5 tibia, 12 mm spacer,  #5 modified medial patellar button, Palacos cement double batch with 1500 mg  Ancef embedded. Medium Hemovac was placed, double-arm into the knee.  Foley  catheter was placed perioperatively, she was placed on perioperative  antibiotics, she was placed on postoperative Coumadin prophylaxis with a  target INR of 1.52.  Physical therapy was begun the day of surgery with CPM.   Postoperative day #1, patient was awake, somewhat sleepy from medication.  No nausea or vomiting. She was  using her CPM without difficulty. She was  afebrile, vital signs stable.  PT 12.8. Urine output 400 cc, drain output 50  cc, then discontinued. She was neurovascularly intact distally.  She began  ambulating with physical therapy.  Postoperative day #2, patient was  complaining of severe pain for progress for PT secondary to pain, positive  nausea and vomiting. She was afebrile, vital signs were stable, hemoglobin  9.9 and WBC 10.8, INR 1.5, urine output 100 cc q.shift. CBGs ranged from 155-  220. Dressing was dry, calf was moderately edematous, she was otherwise  stable.  She continued physical therapy. Rehab consult was obtained and  physical therapy was continued.   The patient was seen in consultation by rehab medicine and they felt that  she might be possible rehab admit. On postoperative day #3, the patient was  complaining of moderate pain but improving.  She was afebrile, hemoglobin  9.9, INR 2.5.  Calf with moderate edema, nontender, dressing was dry.  PCO  was discontinued.  Physical therapy continued, slow to make progress.  Postoperative day #4, the patient was without complaints, she was out of bed  to the room, not yet out the hall.  Hemoglobin 9.4, INR 2.1. Dressing was  dry, staples intact.  She continued physical therapy.  Postoperative day #5,  patient was progressing well, and was discharged home to the care of her  family, after passing physical therapy goals of independent ambulation.   She will be on Coumadin x2 weeks postoperatively per home health agency  through [facility].   ACTIVITY:  Weightbearing as tolerated.   DIET:  Regular.   WOUND CARE:  Dressing change daily.   FINAL DIAGNOSIS:  End-stage osteoarthritis of the left knee.   CONDITION ON DISCHARGE:  Medically stable and improved.   DISCHARGE MEDICATIONS:  1.  Tylox.  2.  Coumadin.  Resume home medications.      Amanda [name]  D:  11/27/2004  T:  11/27/2004  Job:  [id number]

## 2011-04-12 ENCOUNTER — Inpatient Hospital Stay (INDEPENDENT_AMBULATORY_CARE_PROVIDER_SITE_OTHER)
Admission: RE | Admit: 2011-04-12 | Discharge: 2011-04-12 | Disposition: A | Discharge status: Home / Self Care | Payer: Medicare PPO | Source: Ambulatory Visit | Attending: Emergency Medicine | Admitting: Emergency Medicine

## 2011-04-12 DIAGNOSIS — B029 Zoster without complications: Secondary | ICD-10-CM

## 2011-04-17 ENCOUNTER — Emergency Department (HOSPITAL_COMMUNITY)
Admission: EM | Admit: 2011-04-17 | Discharge: 2011-04-17 | Disposition: A | Discharge status: Home / Self Care | Payer: Medicare PPO | Attending: Emergency Medicine | Admitting: Emergency Medicine

## 2011-04-17 DIAGNOSIS — F341 Dysthymic disorder: Secondary | ICD-10-CM | POA: Insufficient documentation

## 2011-04-17 DIAGNOSIS — E785 Hyperlipidemia, unspecified: Secondary | ICD-10-CM | POA: Insufficient documentation

## 2011-04-17 DIAGNOSIS — E119 Type 2 diabetes mellitus without complications: Secondary | ICD-10-CM | POA: Insufficient documentation

## 2011-04-17 DIAGNOSIS — I10 Essential (primary) hypertension: Secondary | ICD-10-CM | POA: Insufficient documentation

## 2011-04-17 DIAGNOSIS — B029 Zoster without complications: Secondary | ICD-10-CM | POA: Insufficient documentation

## 2011-04-17 DIAGNOSIS — K219 Gastro-esophageal reflux disease without esophagitis: Secondary | ICD-10-CM | POA: Insufficient documentation

## 2011-04-17 DIAGNOSIS — Z79899 Other long term (current) drug therapy: Secondary | ICD-10-CM | POA: Insufficient documentation

## 2011-04-17 DIAGNOSIS — R51 Headache: Secondary | ICD-10-CM | POA: Insufficient documentation

## 2011-07-02 ENCOUNTER — Other Ambulatory Visit: Payer: Self-pay | Admitting: Family Medicine

## 2011-07-02 DIAGNOSIS — Z1231 Encounter for screening mammogram for malignant neoplasm of breast: Secondary | ICD-10-CM

## 2011-07-31 ENCOUNTER — Ambulatory Visit: Payer: 59

## 2011-08-14 ENCOUNTER — Ambulatory Visit
Admission: RE | Admit: 2011-08-14 | Discharge: 2011-08-14 | Disposition: A | Discharge status: Home / Self Care | Payer: Medicare Other | Source: Ambulatory Visit | Attending: Family Medicine | Admitting: Family Medicine

## 2011-08-14 DIAGNOSIS — Z1231 Encounter for screening mammogram for malignant neoplasm of breast: Secondary | ICD-10-CM

## 2011-09-23 ENCOUNTER — Other Ambulatory Visit: Payer: Self-pay

## 2011-09-23 ENCOUNTER — Encounter (HOSPITAL_COMMUNITY): Payer: Self-pay | Admitting: *Deleted

## 2011-09-23 ENCOUNTER — Emergency Department (HOSPITAL_COMMUNITY)
Admission: EM | Admit: 2011-09-23 | Discharge: 2011-09-24 | Disposition: A | Discharge status: Home / Self Care | Payer: Medicare PPO | Attending: Emergency Medicine | Admitting: Emergency Medicine

## 2011-09-23 DIAGNOSIS — R269 Unspecified abnormalities of gait and mobility: Secondary | ICD-10-CM | POA: Insufficient documentation

## 2011-09-23 DIAGNOSIS — H5500 Unspecified nystagmus: Secondary | ICD-10-CM | POA: Insufficient documentation

## 2011-09-23 DIAGNOSIS — R5381 Other malaise: Secondary | ICD-10-CM | POA: Insufficient documentation

## 2011-09-23 DIAGNOSIS — I10 Essential (primary) hypertension: Secondary | ICD-10-CM

## 2011-09-23 DIAGNOSIS — R42 Dizziness and giddiness: Secondary | ICD-10-CM | POA: Insufficient documentation

## 2011-09-23 DIAGNOSIS — R51 Headache: Secondary | ICD-10-CM

## 2011-09-23 DIAGNOSIS — Z79899 Other long term (current) drug therapy: Secondary | ICD-10-CM | POA: Insufficient documentation

## 2011-09-23 DIAGNOSIS — I498 Other specified cardiac arrhythmias: Secondary | ICD-10-CM | POA: Insufficient documentation

## 2011-09-23 HISTORY — DX: Essential (primary) hypertension: I10

## 2011-09-23 NOTE — ED Provider Notes (Signed)
History     CSN: [id number] Arrival date & time: 09/23/2011  6:55 PM   First MD Initiated Contact with Patient 09/23/11 2302      Chief Complaint  Patient presents with  . Hypertension    (Consider location/radiation/quality/duration/timing/severity/associated sxs/prior treatment) Patient is a 65 y.o. female presenting with hypertension. The history is provided by the patient.  Hypertension This is a recurrent problem. Episode onset: 2 weeks ago. Associated symptoms include headaches. Pertinent negatives include no chest pain, no abdominal pain and no shortness of breath.   patient states that the last 2 weeks she's been having high blood pressure and dizziness. She states that she's has trouble walking and has put her hand, also shows a fall. She states her blood pressure has been up to 180 systolic. She states she saw her primary care provider today who told her she needed tocome to the hospital by ambulance. She states that she is under a lot of stress because she is taking care of her own husband. She was not able to come right away. She states that where blood pressure is high she has a headache and feels a rushing in her head. She states she gets left-sided headache. The headache is not necessarily associated with the dizziness. She states that besides the dizziness and continued to work properly. She states that she was told to have had a TIA.  Past Medical History  Diagnosis Date  . Hypertension     History reviewed. No pertinent past surgical history.  History reviewed. No pertinent family history.  History  Substance Use Topics  . Smoking status: Never Smoker   . Smokeless tobacco: Not on file  . Alcohol Use: No    OB History    Grav Para Term Preterm Abortions TAB SAB Ect Mult Living                  Review of Systems  Constitutional: Positive for fatigue. Negative for activity change and appetite change.  HENT: Negative for neck stiffness.   Eyes: Negative for  pain.  Respiratory: Negative for chest tightness and shortness of breath.   Cardiovascular: Negative for chest pain and leg swelling.  Gastrointestinal: Negative for nausea, vomiting, abdominal pain and diarrhea.  Genitourinary: Negative for flank pain.  Musculoskeletal: Positive for gait problem. Negative for back pain.  Skin: Negative for rash.  Neurological: Positive for dizziness and headaches. Negative for tremors, syncope, weakness and numbness.  Psychiatric/Behavioral: Negative for behavioral problems.    Allergies  Aciphex; Erythromycin; Macrodantin; Omeprazole-sodium bicarbonate; and Sulfa antibiotics  Home Medications   Current Outpatient Rx  Name Route Sig Dispense Refill  . BUPROPION HCL ER (XL) 300 MG PO TB24 Oral Take 300 mg by mouth daily.      Marland Kitchen VITAMIN D3 1000 UNITS PO CAPS Oral Take 1 capsule by mouth daily.      Marland Kitchen CLONAZEPAM 0.5 MG PO TABS Oral Take 0.5 mg by mouth 3 (three) times daily as needed. For anxiety     . LEVOTHYROXINE SODIUM 25 MCG PO TABS Oral Take 25 mcg by mouth daily.      Marland Kitchen METFORMIN HCL 500 MG PO TABS Oral Take 500 mg by mouth daily.      Marland Kitchen METOPROLOL TARTRATE 50 MG PO TABS Oral Take 25 mg by mouth 2 (two) times daily.      Marland Kitchen OMEPRAZOLE 40 MG PO CPDR Oral Take 40 mg by mouth daily.      Marland Kitchen RAMIPRIL 2.5 MG  PO CAPS Oral Take 2.5 mg by mouth daily.      Marland Kitchen SIMVASTATIN 10 MG PO TABS Oral Take 10 mg by mouth at bedtime.      . TRAZODONE HCL 50 MG PO TABS Oral Take 50-100 mg by mouth at bedtime.      Marland Kitchen HYDROCHLOROTHIAZIDE 12.5 MG PO CAPS Oral Take 12.5 mg by mouth daily.      Marland Kitchen HYDROCODONE-ACETAMINOPHEN 5-500 MG PO TABS Oral Take 1 tablet by mouth 2 (two) times daily as needed. For pain       BP 131/71  Pulse 58  Temp(Src) 97.9 F (36.6 C) (Oral)  Resp 12  SpO2 100%  Physical Exam  Nursing note and vitals reviewed. Constitutional: She is oriented to person, place, and time. She appears well-developed and well-nourished.  HENT:  Head:  Normocephalic and atraumatic.  Eyes: EOM are normal. Pupils are equal, round, and reactive to light.  Neck: Normal range of motion. Neck supple.  Cardiovascular: Normal rate, regular rhythm and normal heart sounds.   No murmur heard. Pulmonary/Chest: Effort normal and breath sounds normal. No respiratory distress. She has no wheezes. She has no rales.  Abdominal: Soft. Bowel sounds are normal. She exhibits no distension. There is no tenderness. There is no rebound and no guarding.  Musculoskeletal: Normal range of motion.  Neurological: She is alert and oriented to person, place, and time. No cranial nerve deficit. Coordination normal.       Finger-nose intact bilateral and no Romberg.  Some mild nystagmus at end gaze laterally. Extraocular movements intact. Normal ambulation -6 slightly unsteady gait from that she says is from her left knee.  Skin: Skin is warm and dry.  Psychiatric: She has a normal mood and affect. Her speech is normal.    ED Course  Procedures (including critical care time)  Labs Reviewed  CBC - Abnormal; Notable for the following:    Hemoglobin 11.4 (*)    HCT 34.7 (*)    All other components within normal limits  COMPREHENSIVE METABOLIC PANEL - Abnormal; Notable for the following:    Glucose, Bld 118 (*)    Albumin 3.4 (*)    GFR calc non Af Amer 52 (*)    GFR calc Af Amer 60 (*)    All other components within normal limits  URINALYSIS, ROUTINE W REFLEX MICROSCOPIC - Abnormal; Notable for the following:    Leukocytes, UA SMALL (*)    All other components within normal limits  URINE MICROSCOPIC-ADD ON - Abnormal; Notable for the following:    Squamous Epithelial / LPF MANY (*)    All other components within normal limits  DIFFERENTIAL   Ct Head Wo Contrast  09/24/2011  *RADIOLOGY REPORT*  Clinical Data: Hypertension, headache, dizziness.  CT HEAD WITHOUT CONTRAST  Technique:  Contiguous axial images were obtained from the base of the skull through the vertex  without contrast.  Comparison: 10/09/2006  Findings: There is no evidence for acute hemorrhage, hydrocephalus, mass lesion, or abnormal extra-axial fluid collection.  No definite CT evidence for acute infarction.  The visualized paranasal sinuses and mastoid air cells are predominately clear.  IMPRESSION: No acute intracranial abnormality.  Original Report Authenticated By: Waneta Martins, M.D.     1. Headache   2. Hypertension      Date: 09/24/2011  Rate: 56  Rhythm: sinus bradycardia  QRS Axis: right  Intervals: normal  ST/T Wave abnormalities: normal  Conduction Disutrbances:none  Narrative Interpretation:   Old EKG Reviewed:  unchanged    MDM  Patient has had high blood pressure and headache for the last couple weeks. States that she feels a whooshing in her head with it. She says that she has also had episodes of unsteadiness. She's not unsteady now. Her head CT shows no infarct at this time. Her blood pressure is mildly elevated here. She will need a TIA workup, would rather have it done as an outpatient. She has a primary care doctor to followup with [place]. She'll be discharged home        Juliet Rude. Rubin Payor, MD 09/24/11 [phone]

## 2011-09-23 NOTE — ED Notes (Signed)
The pt has had high bp for the past week and she has been under a lot  Of stress caring for a chronically ill husband

## 2011-09-23 NOTE — ED Notes (Signed)
Pt amiable, pleasant, alert, interactive,calm,skin W&D, resps e/u, NAD, speech clear, updated, family at [facility], CBIR, bed in low position, reports "wanting something for HA".

## 2011-09-24 ENCOUNTER — Emergency Department (HOSPITAL_COMMUNITY): Payer: Medicare PPO

## 2011-09-24 LAB — COMPREHENSIVE METABOLIC PANEL
ALT: 10 U/L (ref 0–35)
Alkaline Phosphatase: 67 U/L (ref 39–117)
BUN: 12 mg/dL (ref 6–23)
Chloride: 100 mEq/L (ref 96–112)
GFR calc Af Amer: 60 mL/min — ABNORMAL LOW (ref >90)
GFR calc non Af Amer: 52 mL/min — ABNORMAL LOW (ref >90)
Glucose, Bld: 118 mg/dL — ABNORMAL HIGH (ref 70–99)
Total Bilirubin: 0.3 mg/dL (ref 0.3–1.2)

## 2011-09-24 LAB — URINALYSIS, ROUTINE W REFLEX MICROSCOPIC
Bilirubin Urine: NEGATIVE
Ketones, ur: NEGATIVE mg/dL
Nitrite: NEGATIVE
Protein, ur: NEGATIVE mg/dL
Urobilinogen, UA: 0.2 mg/dL (ref 0.0–1.0)

## 2011-09-24 LAB — CBC
HCT: 34.7 % — ABNORMAL LOW (ref 36.0–46.0)
Hemoglobin: 11.4 g/dL — ABNORMAL LOW (ref 12.0–15.0)
MCH: 28.8 pg (ref 26.0–34.0)
MCHC: 32.9 g/dL (ref 30.0–36.0)
MCV: 87.6 fL (ref 78.0–100.0)
Platelets: 298 10*3/uL (ref 150–400)
RBC: 3.96 MIL/uL (ref 3.87–5.11)
RDW: 12.8 % (ref 11.5–15.5)
WBC: 8.7 10*3/uL (ref 4.0–10.5)

## 2011-09-24 LAB — URINE MICROSCOPIC-ADD ON

## 2011-09-24 LAB — DIFFERENTIAL
Basophils Absolute: 0.1 10*3/uL (ref 0.0–0.1)
Basophils Relative: 1 % (ref 0–1)
Eosinophils Absolute: 0.3 10*3/uL (ref 0.0–0.7)
Eosinophils Relative: 3 % (ref 0–5)
Monocytes Absolute: 0.8 10*3/uL (ref 0.1–1.0)
Neutro Abs: 5.4 10*3/uL (ref 1.7–7.7)

## 2011-09-24 MED ORDER — PROMETHAZINE HCL 25 MG/ML IJ SOLN
12.5000 mg | Freq: Once | INTRAMUSCULAR | Status: AC
  Administered 2011-09-24: 12.5 mg via INTRAVENOUS
  Filled 2011-09-24: qty 1

## 2011-09-24 NOTE — ED Notes (Signed)
Attempted IV x2 (unsuccessful), EDP aware, EDP will go and see pt, pending new orders & re-eval. Pt states, HA unchanged, hungry and thirsty, calm, NAD, alert, friend at [facility].

## 2011-09-24 NOTE — ED Notes (Signed)
Alert, amiable, inteactive, NAD, calm, "ready to go", getting dressed. Friend at [facility].

## 2011-09-24 NOTE — ED Notes (Signed)
No changes, here tonight for HA, reports HA, dizziness &  "whooshing in head", intermittantly x1-2 weeks, comes and goes, (denies: nvd, fever, cough, congestion, cold sx), LS CTA, MAEx4, radial pulses equal and strong, PERRL 3mm brisk. Pending lab results, waiting for CT.

## 2011-09-24 NOTE — ED Notes (Signed)
No changes, out with family to d/c desk, out in w/c by EMT, alert, NAD, calm, "feels better", denies sx questions concerns or needs unmet.

## 2012-05-13 ENCOUNTER — Other Ambulatory Visit: Payer: Self-pay | Admitting: Orthopedic Surgery

## 2012-05-13 DIAGNOSIS — M25561 Pain in right knee: Secondary | ICD-10-CM

## 2012-05-13 DIAGNOSIS — R531 Weakness: Secondary | ICD-10-CM

## 2012-05-13 DIAGNOSIS — M2390 Unspecified internal derangement of unspecified knee: Secondary | ICD-10-CM

## 2012-05-17 ENCOUNTER — Ambulatory Visit
Admission: RE | Admit: 2012-05-17 | Discharge: 2012-05-17 | Disposition: A | Discharge status: Home / Self Care | Payer: Medicare PPO | Source: Ambulatory Visit | Attending: Orthopedic Surgery | Admitting: Orthopedic Surgery

## 2012-05-17 DIAGNOSIS — M25561 Pain in right knee: Secondary | ICD-10-CM

## 2012-05-17 DIAGNOSIS — M2390 Unspecified internal derangement of unspecified knee: Secondary | ICD-10-CM

## 2012-05-17 DIAGNOSIS — R531 Weakness: Secondary | ICD-10-CM

## 2012-08-17 ENCOUNTER — Other Ambulatory Visit: Payer: Self-pay | Admitting: Family Medicine

## 2012-08-17 DIAGNOSIS — Z1231 Encounter for screening mammogram for malignant neoplasm of breast: Secondary | ICD-10-CM

## 2012-08-18 ENCOUNTER — Ambulatory Visit
Admission: RE | Admit: 2012-08-18 | Discharge: 2012-08-18 | Disposition: A | Discharge status: Home / Self Care | Payer: Medicare PPO | Source: Ambulatory Visit | Attending: Family Medicine | Admitting: Family Medicine

## 2012-08-18 DIAGNOSIS — Z1231 Encounter for screening mammogram for malignant neoplasm of breast: Secondary | ICD-10-CM

## 2013-07-25 ENCOUNTER — Other Ambulatory Visit: Payer: Self-pay

## 2013-07-25 DIAGNOSIS — Z1231 Encounter for screening mammogram for malignant neoplasm of breast: Secondary | ICD-10-CM

## 2013-08-19 ENCOUNTER — Ambulatory Visit
Admission: RE | Admit: 2013-08-19 | Discharge: 2013-08-19 | Disposition: A | Discharge status: Home / Self Care | Payer: Medicare Other | Source: Ambulatory Visit

## 2013-08-19 DIAGNOSIS — Z1231 Encounter for screening mammogram for malignant neoplasm of breast: Secondary | ICD-10-CM

## 2013-08-30 ENCOUNTER — Ambulatory Visit (INDEPENDENT_AMBULATORY_CARE_PROVIDER_SITE_OTHER): Payer: Medicare Other | Admitting: Internal Medicine

## 2013-08-30 ENCOUNTER — Encounter: Payer: Self-pay | Admitting: Internal Medicine

## 2013-08-30 VITALS — BP 112/68 | HR 57 | Temp 97.6°F | Ht 64.5 in | Wt 216.4 lb

## 2013-08-30 DIAGNOSIS — R05 Cough: Secondary | ICD-10-CM

## 2013-08-30 DIAGNOSIS — R059 Cough, unspecified: Secondary | ICD-10-CM

## 2013-08-30 MED ORDER — PREDNISONE (PAK) 10 MG PO TABS: ORAL_TABLET | ORAL | Status: DC

## 2013-08-30 NOTE — Progress Notes (Signed)
  Subjective:    Patient ID: Amanda [name], female    DOB: [birth year]   MRN: [id number]  HPI  89 yowf never smoker with lots of bronchitis as child but not on any maint inhalers but coughed most days but had good ex tol = swimming and biking never outgrew tendency to cough eval by allergist and ENT doctors = [place] on shots did great x some sinus infections and no need for maint meds but stopped in the 1990's   08/30/2013 1st [facility] office visit/ Vincy Feliz cc cough worse x 4 years since husband dementia x 3 years daily cough typically after stirring in am but also some while sleeping > min white mucus.  As long as not coughing breathing ok. Cough meds help some.  Prednisone helped some while on it.  Albuterol makes it worse.  Neb helped a little.  Was on ACEi stopped ? when  Lost 50 lb since lost husband  No obvious day to day or daytime variabilty or   cp or chest tightness, subjective wheeze overt sinus or hb symptoms. No unusual exp hx or h/o childhood pna/ asthma or knowledge of premature birth.  Sleeping ok without nocturnal  or early am exacerbation  of respiratory  c/o's or need for noct saba. Also denies any obvious fluctuation of symptoms with weather or environmental changes or other aggravating or alleviating factors except as outlined above   Current Medications, Allergies, Complete Past Medical History, Past Surgical History, Family History, and Social History were reviewed in [employer] record.           Review of Systems  Constitutional: Negative for fever and unexpected weight change.  HENT: Negative for congestion, dental problem, ear pain, nosebleeds, postnasal drip, rhinorrhea, sinus pressure, sneezing, sore throat and trouble swallowing.   Eyes: Negative for redness and itching.  Respiratory: Positive for cough and shortness of breath. Negative for chest tightness and wheezing.   Cardiovascular: Negative for palpitations and leg swelling.   Gastrointestinal: Negative for nausea and vomiting.  Genitourinary: Negative for dysuria.  Musculoskeletal: Negative for joint swelling.  Skin: Negative for rash.  Neurological: Negative for headaches.  Hematological: Does not bruise/bleed easily.  Psychiatric/Behavioral: Positive for dysphoric mood. The patient is nervous/anxious.        Objective:   Physical Exam  Somber wf nad Wt Readings from Last 3 Encounters:  08/30/13 216 lb 6.4 oz (98.158 kg)      HEENT: nl dentition, turbinates, and orophanx. Nl external ear canals without cough reflex   NECK :  without JVD/Nodes/TM/ nl carotid upstrokes bilaterally   LUNGS: no acc muscle use, clear to A and P bilaterally without cough on insp or exp maneuvers   CV:  RRR  no s3 or murmur or increase in P2, no edema   ABD:  soft and nontender with nl excursion in the supine position. No bruits or organomegaly, bowel sounds nl  MS:  warm without deformities, calf tenderness, cyanosis or clubbing  SKIN: warm and dry without lesions    NEURO:  alert, approp, no deficits    cxr in [facility] first oct 2014 reported to pt as nl       Assessment & Plan:

## 2013-08-30 NOTE — Patient Instructions (Addendum)
Prilosec 40 mg Take 30-60 min before first meal of the day and pepcid 20 mg at bedtime along with chlortrimeton 4 mg at bedtime  Stop zyrtec  Prednisone 10 mg take  4 each am x 2 days,   2 each am x 2 days,  1 each am x 2 days and stop   Take delsym two tsp every 12 hours and supplement if needed with vicodin  up to 2 every 4 hours to suppress the urge to cough. Swallowing water or using ice chips/non mint and menthol containing candies (such as lifesavers or sugarless jolly ranchers) are also effective.  You should rest your voice and avoid activities that you know make you cough.  Once you have eliminated the cough for 3 straight days try reducing the vicodin first,  then the delsym as tolerated.    GERD (REFLUX)  is an extremely common cause of respiratory symptoms, many times with no significant heartburn at all.    It can be treated with medication, but also with lifestyle changes including avoidance of late meals, excessive alcohol, smoking cessation, and avoid fatty foods, chocolate, peppermint, colas, red wine, and acidic juices such as orange juice.  NO MINT OR MENTHOL PRODUCTS SO NO COUGH DROPS  USE SUGARLESS CANDY INSTEAD (jolley ranchers or Stover's)  NO OIL BASED VITAMINS - use powdered substitutes.  Please schedule a follow up office visit in 4 weeks, sooner if needed with all medications in hand

## 2013-08-31 ENCOUNTER — Encounter: Payer: Self-pay | Admitting: Internal Medicine

## 2013-08-31 DIAGNOSIS — R05 Cough: Secondary | ICD-10-CM | POA: Insufficient documentation

## 2013-08-31 NOTE — Assessment & Plan Note (Signed)
The most common causes of chronic cough in immunocompetent adults include the following: upper airway cough syndrome (UACS), previously referred to as postnasal drip syndrome (PNDS), which is caused by variety of rhinosinus conditions; (2) asthma; (3) GERD; (4) chronic bronchitis from cigarette smoking or other inhaled environmental irritants; (5) nonasthmatic eosinophilic bronchitis; and (6) bronchiectasis.   These conditions, singly or in combination, have accounted for up to 94% of the causes of chronic cough in prospective studies.   Other conditions have constituted no >6% of the causes in prospective studies These have included bronchogenic carcinoma, chronic interstitial pneumonia, sarcoidosis, left ventricular failure, ACEI-induced cough, and aspiration from a condition associated with pharyngeal dysfunction.    Chronic cough is often simultaneously caused by more than one condition. A single cause has been found from 38 to 82% of the time, multiple causes from 18 to 62%. Multiply caused cough has been the result of three diseases up to 42% of the time.      This is almost certainly  Classic Upper airway cough syndrome, so named because it's frequently impossible to sort out how much is  CR/sinusitis with freq throat clearing (which can be related to primary GERD)   vs  causing  secondary (" extra esophageal")  GERD from wide swings in gastric pressure that occur with throat clearing, often  promoting self use of mint and menthol lozenges that reduce the lower esophageal sphincter tone and exacerbate the problem further in a cyclical fashion.   These are the same pts (now being labeled as having "irritable larynx syndrome" by some cough centers) who not infrequently have a history of having failed to tolerate ace inhibitors,  dry powder inhalers or biphosphonates or report having atypical reflux symptoms that don't respond to standard doses of PPI , and are easily confused as having aecopd or  asthma flares by even experienced allergists/ pulmonologists.  For now rx with max gerd rx / cyclical cough regimen  then regroup

## 2013-09-27 ENCOUNTER — Encounter: Payer: Self-pay | Admitting: Internal Medicine

## 2013-09-27 ENCOUNTER — Ambulatory Visit (INDEPENDENT_AMBULATORY_CARE_PROVIDER_SITE_OTHER): Payer: Medicare Other | Admitting: Internal Medicine

## 2013-09-27 VITALS — BP 120/70 | HR 70 | Temp 97.7°F | Ht 64.0 in | Wt 231.0 lb

## 2013-09-27 DIAGNOSIS — R059 Cough, unspecified: Secondary | ICD-10-CM

## 2013-09-27 DIAGNOSIS — R05 Cough: Secondary | ICD-10-CM

## 2013-09-27 NOTE — Patient Instructions (Signed)
No change in regimen through the holidays but after the first of the year you may be able to modify the acid medications > defer to Dr Hal Hope   If you are satisfied with your treatment plan let your doctor know and he/she can either refill your medications or you can return here when your prescription runs out.     If in any way you are not 100% satisfied,  please tell us.  If 100% better, tell your friends!

## 2013-09-27 NOTE — Progress Notes (Signed)
Subjective:    Patient ID: Amanda [name], female    DOB: 1945/12/20   MRN: [id number]   Brief patient profile:  34 yowf never smoker with lots of bronchitis as child but not on any maint inhalers but coughed most days but had good ex tol = swimming and biking never outgrew tendency to cough eval by allergist and ENT doctors = [place] on shots did great x some sinus infections and no need for maint meds but stopped in the 1990's    History of Present Illness  08/30/2013 1st [facility] office visit/ Amanda [name] cc cough worse x 4 years since husband dementia x 3 years daily cough typically after stirring in am but also some while sleeping > min white mucus.  As long as not coughing breathing ok. Cough meds help some.  Prednisone helped some while on it.  Albuterol makes it worse.  Neb helped a little.  Was on ACEi stopped ? when Lost 50 lb since lost mother rec Prilosec 40 mg Take 30-60 min before first meal of the day and pepcid 20 mg at bedtime along with chlortrimeton 4 mg at bedtime Stop zyrtec Prednisone 10 mg take  4 each am x 2 days,   2 each am x 2 days,  1 each am x 2 days and stop  Take delsym two tsp every 12 hours and supplement if needed with vicodin  up to 2 every 4 hours    09/27/2013 f/u ov/Amanda [name] re: cough x 3 years Chief Complaint  Patient presents with  . Follow-up    Pt states that her cough is much improved, no new co's today.   no longer needing any cough meds,  No obvious day to day or daytime variabilty or sob or  cp or chest tightness, subjective wheeze overt sinus or hb symptoms. No unusual exp hx or h/o childhood pna/ asthma or knowledge of premature birth.   Sleeping ok without nocturnal  or early am exacerbation  of respiratory  c/o's or need for noct saba. Also denies any obvious fluctuation of symptoms with weather or environmental changes or other aggravating or alleviating factors except as outlined above   Current Medications, Allergies, Complete Past  Medical History, Past Surgical History, Family History, and Social History were reviewed in [employer] record.  ROS  The following are not active complaints unless bolded sore throat, dysphagia, dental problems, itching, sneezing,  nasal congestion or excess/ purulent secretions, ear ache,   fever, chills, sweats, unintended wt loss, pleuritic or exertional cp, hemoptysis,  orthopnea pnd or leg swelling, presyncope, palpitations, heartburn, abdominal pain, anorexia, nausea, vomiting, diarrhea  or change in bowel or urinary habits, change in stools or urine, dysuria,hematuria,  rash, arthralgias, visual complaints, headache, numbness weakness or ataxia or problems with walking or coordination,  change in mood/affect or memory.                     Objective:   Physical Exam  Somber wf nad, a bit more hopeful  Wt Readings from Last 3 Encounters:  09/27/13 231 lb (104.781 kg)  08/30/13 216 lb 6.4 oz (98.158 kg)         HEENT: nl dentition, turbinates, and orophanx. Nl external ear canals without cough reflex   NECK :  without JVD/Nodes/TM/ nl carotid upstrokes bilaterally   LUNGS: no acc muscle use, clear to A and P bilaterally without cough on insp or exp maneuvers   CV:  RRR  no s3 or murmur or increase in P2, no edema   ABD:  soft and nontender with nl excursion in the supine position. No bruits or organomegaly, bowel sounds nl  MS:  warm without deformities, calf tenderness, cyanosis or clubbing      cxr in [facility] first oct 2014 reported to pt as nl       Assessment & Plan:

## 2013-09-27 NOTE — Assessment & Plan Note (Signed)
-   acei (ramapril)  stopped around 08/01/13    Classic Upper airway cough syndrome, so named because it's frequently impossible to sort out how much is  CR/sinusitis with freq throat clearing (which can be related to primary GERD)   vs  causing  secondary (" extra esophageal")  GERD from wide swings in gastric pressure that occur with throat clearing, often  promoting self use of mint and menthol lozenges that reduce the lower esophageal sphincter tone and exacerbate the problem further in a cyclical fashion.   These are the same pts (now being labeled as having "irritable larynx syndrome" by some cough centers) who not infrequently have a history of having failed to tolerate ace inhibitors,  dry powder inhalers or biphosphonates or report having atypical reflux symptoms that don't respond to standard doses of PPI , and are easily confused as having aecopd or asthma flares by even experienced allergists/ pulmonologists.   Much better but not clear whether the acei caused the cough or the acid reflux caused the acei intolerance at this point so rec Leave off acei indefinitely and then taper off the acid rx per Dr Wendee Copp discretion p  the holiday season.  Pulmonary f/u is prn

## 2014-07-19 ENCOUNTER — Other Ambulatory Visit: Payer: Self-pay

## 2014-07-19 DIAGNOSIS — Z1231 Encounter for screening mammogram for malignant neoplasm of breast: Secondary | ICD-10-CM

## 2014-08-09 ENCOUNTER — Other Ambulatory Visit (HOSPITAL_COMMUNITY): Payer: Self-pay | Admitting: Orthopedic Surgery

## 2014-08-22 ENCOUNTER — Encounter (INDEPENDENT_AMBULATORY_CARE_PROVIDER_SITE_OTHER): Payer: Self-pay

## 2014-08-22 ENCOUNTER — Ambulatory Visit
Admission: RE | Admit: 2014-08-22 | Discharge: 2014-08-22 | Disposition: A | Discharge status: Home / Self Care | Payer: Medicare Other | Source: Ambulatory Visit

## 2014-08-22 DIAGNOSIS — Z1231 Encounter for screening mammogram for malignant neoplasm of breast: Secondary | ICD-10-CM

## 2014-08-30 ENCOUNTER — Encounter (HOSPITAL_COMMUNITY): Payer: Self-pay

## 2014-08-31 ENCOUNTER — Encounter (HOSPITAL_COMMUNITY): Payer: Self-pay

## 2014-08-31 ENCOUNTER — Encounter (HOSPITAL_COMMUNITY)
Admission: RE | Admit: 2014-08-31 | Discharge: 2014-08-31 | Disposition: A | Discharge status: Home / Self Care | Payer: Medicare Other | Source: Ambulatory Visit | Attending: Orthopedic Surgery | Admitting: Orthopedic Surgery

## 2014-08-31 DIAGNOSIS — K219 Gastro-esophageal reflux disease without esophagitis: Secondary | ICD-10-CM | POA: Diagnosis not present

## 2014-08-31 DIAGNOSIS — I341 Nonrheumatic mitral (valve) prolapse: Secondary | ICD-10-CM | POA: Diagnosis not present

## 2014-08-31 DIAGNOSIS — M129 Arthropathy, unspecified: Secondary | ICD-10-CM | POA: Diagnosis not present

## 2014-08-31 DIAGNOSIS — I10 Essential (primary) hypertension: Secondary | ICD-10-CM | POA: Insufficient documentation

## 2014-08-31 DIAGNOSIS — G4733 Obstructive sleep apnea (adult) (pediatric): Secondary | ICD-10-CM | POA: Insufficient documentation

## 2014-08-31 DIAGNOSIS — E119 Type 2 diabetes mellitus without complications: Secondary | ICD-10-CM | POA: Insufficient documentation

## 2014-08-31 DIAGNOSIS — Z01818 Encounter for other preprocedural examination: Secondary | ICD-10-CM | POA: Diagnosis present

## 2014-08-31 DIAGNOSIS — E039 Hypothyroidism, unspecified: Secondary | ICD-10-CM | POA: Diagnosis not present

## 2014-08-31 HISTORY — DX: Depression, unspecified: F32.A

## 2014-08-31 HISTORY — DX: Nonrheumatic mitral (valve) prolapse: I34.1

## 2014-08-31 HISTORY — DX: Major depressive disorder, single episode, unspecified: F32.9

## 2014-08-31 HISTORY — DX: Dizziness and giddiness: R42

## 2014-08-31 HISTORY — DX: Headache, unspecified: R51.9

## 2014-08-31 HISTORY — DX: Unspecified osteoarthritis, unspecified site: M19.90

## 2014-08-31 HISTORY — DX: Pneumonia, unspecified organism: J18.9

## 2014-08-31 HISTORY — DX: Hypothyroidism, unspecified: E03.9

## 2014-08-31 HISTORY — DX: Inflammatory liver disease, unspecified: K75.9

## 2014-08-31 HISTORY — DX: Adverse effect of unspecified anesthetic, initial encounter: T41.45XA

## 2014-08-31 HISTORY — DX: Anxiety disorder, unspecified: F41.9

## 2014-08-31 HISTORY — DX: Other complications of anesthesia, initial encounter: T88.59XA

## 2014-08-31 HISTORY — DX: Gastro-esophageal reflux disease without esophagitis: K21.9

## 2014-08-31 HISTORY — DX: Headache: R51

## 2014-08-31 HISTORY — DX: Unspecified asthma, uncomplicated: J45.909

## 2014-08-31 LAB — BASIC METABOLIC PANEL
ANION GAP: 12 (ref 5–15)
BUN: 13 mg/dL (ref 6–23)
CHLORIDE: 96 meq/L (ref 96–112)
CO2: 28 mEq/L (ref 19–32)
CREATININE: 1.09 mg/dL (ref 0.50–1.10)
Calcium: 9.8 mg/dL (ref 8.4–10.5)
GFR calc non Af Amer: 51 mL/min — ABNORMAL LOW (ref >90)
GFR, EST AFRICAN AMERICAN: 59 mL/min — AB (ref >90)
Glucose, Bld: 117 mg/dL — ABNORMAL HIGH (ref 70–99)
Potassium: 4.7 mEq/L (ref 3.7–5.3)
SODIUM: 136 meq/L — AB (ref 137–147)

## 2014-08-31 LAB — URINALYSIS, ROUTINE W REFLEX MICROSCOPIC
BILIRUBIN URINE: NEGATIVE
Glucose, UA: NEGATIVE mg/dL
HGB URINE DIPSTICK: NEGATIVE
Ketones, ur: NEGATIVE mg/dL
Nitrite: NEGATIVE
PROTEIN: NEGATIVE mg/dL
Specific Gravity, Urine: 1.02 (ref 1.005–1.030)
UROBILINOGEN UA: 0.2 mg/dL (ref 0.0–1.0)
pH: 6 (ref 5.0–8.0)

## 2014-08-31 LAB — CBC
HCT: 37.3 % (ref 36.0–46.0)
HEMOGLOBIN: 12.4 g/dL (ref 12.0–15.0)
MCH: 28.4 pg (ref 26.0–34.0)
MCHC: 33.2 g/dL (ref 30.0–36.0)
MCV: 85.4 fL (ref 78.0–100.0)
Platelets: 361 10*3/uL (ref 150–400)
RBC: 4.37 MIL/uL (ref 3.87–5.11)
RDW: 12.7 % (ref 11.5–15.5)
WBC: 8.2 10*3/uL (ref 4.0–10.5)

## 2014-08-31 LAB — URINE MICROSCOPIC-ADD ON

## 2014-08-31 LAB — APTT: APTT: 30 s (ref 24–37)

## 2014-08-31 LAB — PROTIME-INR
INR: 0.99 (ref 0.00–1.49)
Prothrombin Time: 13.2 seconds (ref 11.6–15.2)

## 2014-08-31 LAB — TYPE AND SCREEN
ABO/RH(D): A POS
ANTIBODY SCREEN: NEGATIVE

## 2014-08-31 LAB — ABO/RH: ABO/RH(D): A POS

## 2014-08-31 LAB — SURGICAL PCR SCREEN
MRSA, PCR: NEGATIVE
STAPHYLOCOCCUS AUREUS: NEGATIVE

## 2014-08-31 NOTE — Pre-Procedure Instructions (Signed)
Amanda [name]  08/31/2014   Your procedure is scheduled on:  Tuesday, October 27th  Report to [facility] Admitting at 530 AM.  Call this number if you have problems the morning of surgery: [phone]   Remember:   Do not eat food or drink liquids after midnight.   Take these medicines the morning of surgery with A SIP OF WATER: wellbutrin, klonopin, synthroid, lopressor, prilosec, effexor, hydrocodone if needed, albuterol if needed   Do not wear jewelry, make-up or nail polish.  Do not wear lotions, powders, or perfumes. You may wear deodorant.  Do not shave 48 hours prior to surgery. Men may shave face and neck.  Do not bring valuables to the hospital.  [facility] is not responsible  for any belongings or valuables.               Contacts, dentures or bridgework may not be worn into surgery.  Leave suitcase in the car. After surgery it may be brought to your room.  For patients admitted to the hospital, discharge time is determined by your  treatment team.            Please read over the following fact sheets that you were given: Pain Booklet, Coughing and Deep Breathing, Blood Transfusion Information, MRSA Information and Surgical Site Infection Prevention [place] - Preparing for Surgery  Before surgery, you can play an important role.  Because skin is not sterile, your skin needs to be as free of germs as possible.  You can reduce the number of germs on you skin by washing with CHG (chlorahexidine gluconate) soap before surgery.  CHG is an antiseptic cleaner which kills germs and bonds with the skin to continue killing germs even after washing.  Please DO NOT use if you have an allergy to CHG or antibacterial soaps.  If your skin becomes reddened/irritated stop using the CHG and inform your nurse when you arrive at Short Stay.  Do not shave (including legs and underarms) for at least 48 hours prior to the first CHG shower.  You may shave your face.  Please  follow these instructions carefully:   1.  Shower with CHG Soap the night before surgery and the morning of Surgery.  2.  If you choose to wash your hair, wash your hair first as usual with your normal shampoo.  3.  After you shampoo, rinse your hair and body thoroughly to remove the shampoo.  4.  Use CHG as you would any other liquid soap.  You can apply CHG directly to the skin and wash gently with scrungie or a clean washcloth.  5.  Apply the CHG Soap to your body ONLY FROM THE NECK DOWN.  Do not use on open wounds or open sores.  Avoid contact with your eyes, ears, mouth and genitals (private parts).  Wash genitals (private parts) with your normal soap.  6.  Wash thoroughly, paying special attention to the area where your surgery will be performed.  7.  Thoroughly rinse your body with warm water from the neck down.  8.  DO NOT shower/wash with your normal soap after using and rinsing off the CHG Soap.  9.  Pat yourself dry with a clean towel.            10.  Wear clean pajamas.            11.  Place clean sheets on your bed the night of your first shower and  do not sleep with pets.  Day of Surgery  Do not apply any lotions/deoderants the morning of surgery.  Please wear clean clothes to the hospital/surgery center.

## 2014-08-31 NOTE — Progress Notes (Signed)
Primary - [facility] in [place] No cardiologist - saw dr. Verlon Setting a long time ago Had stress >7 years ago

## 2014-09-01 LAB — URINE CULTURE: Colony Count: 30000

## 2014-09-01 NOTE — Progress Notes (Signed)
Anesthesia Chart Review:  Pt is 68 year old female scheduled for R total knee arthroplasty on 09/12/14 with Dr. Marlou Sa.   PMH: HTN, DM, OSA, mitral valve prolapse, hypothyroidism, GERD. BMI 38  Medications include: metoprolol, HCTZ, losartan, albuterol, metformin, simvastatin, levothyroxine, trazodone, effexor, klonopin  Preoperative labs reviewed.    Chest x-ray reviewed. No active cardiopulmonary disease.  EKG: Sinus bradycardia. Possible anterior infarct, age undetermined. There is a Q wave in lead III that appears insignificant in AVF; this was noted in prior EKGs 2000 and 2005.   Cardiac cath in 2007. Normal appearing coronary arteries. Normal LV systolic function with EF 55-60%. Left dominant system. Cardiologist was Verlon Setting but pt has not seen cardiologist in many years.   Called and spoke with pt. She does water aerobics twice weekly and does all of her own housework without any sx of chest pain or SOB.  Did have echo of heart about 20 years ago which is when MVP was discovered; nothing more recent.   If no changes, I anticipate pt can proceed with surgery as scheduled.   Willeen Cass, FNP-BC [facility]/Anesthesiology Phone: [phone] 09/01/2014 2:06 PM

## 2014-09-01 NOTE — Progress Notes (Signed)
error 

## 2014-09-11 MED ORDER — CEFAZOLIN SODIUM-DEXTROSE 2-3 GM-% IV SOLR
2.0000 g | INTRAVENOUS | Status: AC
  Administered 2014-09-12: 2 g via INTRAVENOUS
  Filled 2014-09-11: qty 50

## 2014-09-12 ENCOUNTER — Encounter (HOSPITAL_COMMUNITY)
Admission: RE | Disposition: A | Discharge status: Skilled Nursing Facility | Payer: Self-pay | Source: Ambulatory Visit | Attending: Orthopedic Surgery

## 2014-09-12 ENCOUNTER — Inpatient Hospital Stay (HOSPITAL_COMMUNITY)
Admission: RE | Admit: 2014-09-12 | Discharge: 2014-09-15 | DRG: 470 | Disposition: A | Discharge status: Skilled Nursing Facility | Payer: Medicare Other | Source: Ambulatory Visit | Attending: Orthopedic Surgery | Admitting: Orthopedic Surgery

## 2014-09-12 ENCOUNTER — Encounter (HOSPITAL_COMMUNITY): Payer: Self-pay | Admitting: *Deleted

## 2014-09-12 ENCOUNTER — Inpatient Hospital Stay (HOSPITAL_COMMUNITY): Payer: Medicare Other | Admitting: Anesthesiology

## 2014-09-12 ENCOUNTER — Encounter (HOSPITAL_COMMUNITY): Payer: Medicare Other | Admitting: Vascular Surgery

## 2014-09-12 DIAGNOSIS — M150 Primary generalized (osteo)arthritis: Principal | ICD-10-CM | POA: Diagnosis present

## 2014-09-12 DIAGNOSIS — Z881 Allergy status to other antibiotic agents status: Secondary | ICD-10-CM | POA: Diagnosis not present

## 2014-09-12 DIAGNOSIS — G4730 Sleep apnea, unspecified: Secondary | ICD-10-CM | POA: Diagnosis present

## 2014-09-12 DIAGNOSIS — F329 Major depressive disorder, single episode, unspecified: Secondary | ICD-10-CM | POA: Diagnosis present

## 2014-09-12 DIAGNOSIS — Z6838 Body mass index (BMI) 38.0-38.9, adult: Secondary | ICD-10-CM | POA: Diagnosis not present

## 2014-09-12 DIAGNOSIS — F419 Anxiety disorder, unspecified: Secondary | ICD-10-CM | POA: Diagnosis present

## 2014-09-12 DIAGNOSIS — E039 Hypothyroidism, unspecified: Secondary | ICD-10-CM | POA: Diagnosis present

## 2014-09-12 DIAGNOSIS — K219 Gastro-esophageal reflux disease without esophagitis: Secondary | ICD-10-CM | POA: Diagnosis present

## 2014-09-12 DIAGNOSIS — Z888 Allergy status to other drugs, medicaments and biological substances status: Secondary | ICD-10-CM | POA: Diagnosis not present

## 2014-09-12 DIAGNOSIS — Z7901 Long term (current) use of anticoagulants: Secondary | ICD-10-CM | POA: Diagnosis not present

## 2014-09-12 DIAGNOSIS — J45909 Unspecified asthma, uncomplicated: Secondary | ICD-10-CM | POA: Diagnosis present

## 2014-09-12 DIAGNOSIS — E119 Type 2 diabetes mellitus without complications: Secondary | ICD-10-CM | POA: Diagnosis present

## 2014-09-12 DIAGNOSIS — M1710 Unilateral primary osteoarthritis, unspecified knee: Secondary | ICD-10-CM | POA: Diagnosis present

## 2014-09-12 DIAGNOSIS — Z886 Allergy status to analgesic agent status: Secondary | ICD-10-CM | POA: Diagnosis not present

## 2014-09-12 DIAGNOSIS — I10 Essential (primary) hypertension: Secondary | ICD-10-CM | POA: Diagnosis present

## 2014-09-12 DIAGNOSIS — Z79899 Other long term (current) drug therapy: Secondary | ICD-10-CM

## 2014-09-12 DIAGNOSIS — Z882 Allergy status to sulfonamides status: Secondary | ICD-10-CM

## 2014-09-12 DIAGNOSIS — M25561 Pain in right knee: Secondary | ICD-10-CM | POA: Diagnosis present

## 2014-09-12 HISTORY — PX: TOTAL KNEE ARTHROPLASTY: SHX125

## 2014-09-12 LAB — GLUCOSE, CAPILLARY
GLUCOSE-CAPILLARY: 133 mg/dL — AB (ref 70–99)
Glucose-Capillary: 157 mg/dL — ABNORMAL HIGH (ref 70–99)
Glucose-Capillary: 167 mg/dL — ABNORMAL HIGH (ref 70–99)

## 2014-09-12 SURGERY — ARTHROPLASTY, KNEE, TOTAL
Anesthesia: General | Site: Knee | Laterality: Right

## 2014-09-12 MED ORDER — CHLORHEXIDINE GLUCONATE 4 % EX LIQD
60.0000 mL | Freq: Once | CUTANEOUS | Status: DC
  Filled 2014-09-12: qty 60

## 2014-09-12 MED ORDER — WARFARIN SODIUM 7.5 MG PO TABS
7.5000 mg | ORAL_TABLET | Freq: Once | ORAL | Status: AC
  Administered 2014-09-12: 7.5 mg via ORAL
  Filled 2014-09-12 (×2): qty 1

## 2014-09-12 MED ORDER — LIDOCAINE HCL (CARDIAC) 20 MG/ML IV SOLN
INTRAVENOUS | Status: DC | PRN
  Administered 2014-09-12: 20 mg via INTRAVENOUS

## 2014-09-12 MED ORDER — LOSARTAN POTASSIUM 25 MG PO TABS
12.5000 mg | ORAL_TABLET | Freq: Every day | ORAL | Status: DC
  Administered 2014-09-13: 12.5 mg via ORAL
  Administered 2014-09-14: 12:00:00 via ORAL
  Administered 2014-09-15: 12.5 mg via ORAL
  Filled 2014-09-12 (×4): qty 0.5

## 2014-09-12 MED ORDER — OXYCODONE HCL 5 MG PO TABS
5.0000 mg | ORAL_TABLET | Freq: Once | ORAL | Status: AC | PRN
  Administered 2014-09-12: 5 mg via ORAL

## 2014-09-12 MED ORDER — PROPOFOL 10 MG/ML IV BOLUS
INTRAVENOUS | Status: DC | PRN
  Administered 2014-09-12: 120 mg via INTRAVENOUS

## 2014-09-12 MED ORDER — VENLAFAXINE HCL 50 MG PO TABS
100.0000 mg | ORAL_TABLET | Freq: Every day | ORAL | Status: DC
  Administered 2014-09-13 – 2014-09-15 (×3): 100 mg via ORAL
  Filled 2014-09-12 (×4): qty 2

## 2014-09-12 MED ORDER — POTASSIUM CHLORIDE IN NACL 20-0.9 MEQ/L-% IV SOLN
INTRAVENOUS | Status: AC
  Administered 2014-09-13: 05:00:00 via INTRAVENOUS
  Filled 2014-09-12 (×2): qty 1000

## 2014-09-12 MED ORDER — MORPHINE SULFATE 4 MG/ML IJ SOLN
INTRAMUSCULAR | Status: DC | PRN
  Administered 2014-09-12: 8 mg via INTRAVENOUS

## 2014-09-12 MED ORDER — WARFARIN - PHARMACIST DOSING INPATIENT
Freq: Every day | Status: DC
  Administered 2014-09-13: 18:00:00

## 2014-09-12 MED ORDER — SODIUM CHLORIDE 0.9 % IJ SOLN
INTRAMUSCULAR | Status: DC | PRN
  Administered 2014-09-12: 60 mL via INTRAVENOUS

## 2014-09-12 MED ORDER — HYDROMORPHONE HCL 1 MG/ML IJ SOLN
INTRAMUSCULAR | Status: AC
  Filled 2014-09-12: qty 1

## 2014-09-12 MED ORDER — MORPHINE SULFATE 4 MG/ML IJ SOLN
INTRAMUSCULAR | Status: AC
  Filled 2014-09-12: qty 2

## 2014-09-12 MED ORDER — METHOCARBAMOL 500 MG PO TABS
ORAL_TABLET | ORAL | Status: AC
  Filled 2014-09-12: qty 1

## 2014-09-12 MED ORDER — 0.9 % SODIUM CHLORIDE (POUR BTL) OPTIME
TOPICAL | Status: DC | PRN
  Administered 2014-09-12 (×3): 1000 mL

## 2014-09-12 MED ORDER — ACETAMINOPHEN 650 MG RE SUPP: 650.0000 mg | Freq: Four times a day (QID) | RECTAL | Status: DC | PRN

## 2014-09-12 MED ORDER — TRANEXAMIC ACID 100 MG/ML IV SOLN
1000.0000 mg | INTRAVENOUS | Status: DC
  Filled 2014-09-12: qty 10

## 2014-09-12 MED ORDER — METOPROLOL TARTRATE 25 MG PO TABS
25.0000 mg | ORAL_TABLET | Freq: Two times a day (BID) | ORAL | Status: DC
  Administered 2014-09-12 – 2014-09-15 (×6): 25 mg via ORAL
  Filled 2014-09-12 (×7): qty 1

## 2014-09-12 MED ORDER — METHOCARBAMOL 500 MG PO TABS
500.0000 mg | ORAL_TABLET | Freq: Four times a day (QID) | ORAL | Status: DC | PRN
  Administered 2014-09-12 – 2014-09-13 (×4): 500 mg via ORAL
  Filled 2014-09-12 (×4): qty 1

## 2014-09-12 MED ORDER — DEXTROSE 5 % IV SOLN
INTRAVENOUS | Status: DC | PRN
  Administered 2014-09-12: 08:00:00 via INTRAVENOUS

## 2014-09-12 MED ORDER — LACTATED RINGERS IV SOLN
INTRAVENOUS | Status: DC | PRN
  Administered 2014-09-12 (×2): via INTRAVENOUS

## 2014-09-12 MED ORDER — BUPIVACAINE LIPOSOME 1.3 % IJ SUSP
20.0000 mL | INTRAMUSCULAR | Status: AC
  Administered 2014-09-12: 20 mL
  Filled 2014-09-12: qty 20

## 2014-09-12 MED ORDER — DIPHENHYDRAMINE HCL 25 MG PO TABS
25.0000 mg | ORAL_TABLET | Freq: Every evening | ORAL | Status: DC | PRN
Start: 2014-09-12 — End: 2014-09-15
  Filled 2014-09-12: qty 1

## 2014-09-12 MED ORDER — PROPOFOL 10 MG/ML IV BOLUS
INTRAVENOUS | Status: AC
Start: 2014-09-12 — End: 2014-09-12
  Filled 2014-09-12: qty 20

## 2014-09-12 MED ORDER — OXYCODONE HCL 5 MG PO TABS
5.0000 mg | ORAL_TABLET | ORAL | Status: DC | PRN
  Administered 2014-09-12 (×2): 10 mg via ORAL
  Administered 2014-09-13: 5 mg via ORAL
  Administered 2014-09-13 (×3): 10 mg via ORAL
  Administered 2014-09-14 – 2014-09-15 (×3): 5 mg via ORAL
  Filled 2014-09-12 (×2): qty 2
  Filled 2014-09-12: qty 1
  Filled 2014-09-12 (×3): qty 2
  Filled 2014-09-12 (×3): qty 1

## 2014-09-12 MED ORDER — HYDROMORPHONE HCL 1 MG/ML IJ SOLN
0.2500 mg | INTRAMUSCULAR | Status: DC | PRN
  Administered 2014-09-12: 0.5 mg via INTRAVENOUS
  Administered 2014-09-12 (×2): 0.25 mg via INTRAVENOUS
  Administered 2014-09-12: 0.5 mg via INTRAVENOUS

## 2014-09-12 MED ORDER — METOCLOPRAMIDE HCL 5 MG/ML IJ SOLN: 5.0000 mg | Freq: Three times a day (TID) | INTRAMUSCULAR | Status: DC | PRN

## 2014-09-12 MED ORDER — HYDROCHLOROTHIAZIDE 12.5 MG PO CAPS
12.5000 mg | ORAL_CAPSULE | Freq: Every day | ORAL | Status: DC
  Administered 2014-09-13 – 2014-09-15 (×3): 12.5 mg via ORAL
  Filled 2014-09-12 (×3): qty 1

## 2014-09-12 MED ORDER — OXYCODONE HCL 5 MG PO TABS
ORAL_TABLET | ORAL | Status: AC
  Filled 2014-09-12: qty 1

## 2014-09-12 MED ORDER — SIMVASTATIN 10 MG PO TABS
10.0000 mg | ORAL_TABLET | Freq: Every day | ORAL | Status: DC
  Administered 2014-09-12 – 2014-09-14 (×3): 10 mg via ORAL
  Filled 2014-09-12 (×4): qty 1

## 2014-09-12 MED ORDER — WARFARIN VIDEO: Freq: Once | Status: DC

## 2014-09-12 MED ORDER — VITAMIN D 1000 UNITS PO TABS
1000.0000 [IU] | ORAL_TABLET | Freq: Every day | ORAL | Status: DC
  Administered 2014-09-13 – 2014-09-15 (×3): 1000 [IU] via ORAL
  Filled 2014-09-12 (×3): qty 1

## 2014-09-12 MED ORDER — BUPIVACAINE-EPINEPHRINE (PF) 0.5% -1:200000 IJ SOLN
INTRAMUSCULAR | Status: DC | PRN
  Administered 2014-09-12: 25 mL via PERINEURAL

## 2014-09-12 MED ORDER — MENTHOL 3 MG MT LOZG: 1.0000 | LOZENGE | OROMUCOSAL | Status: DC | PRN

## 2014-09-12 MED ORDER — ARTIFICIAL TEARS OP OINT
TOPICAL_OINTMENT | Freq: Every day | OPHTHALMIC | Status: DC
  Administered 2014-09-12 – 2014-09-13 (×2): via OPHTHALMIC
  Filled 2014-09-12: qty 3.5

## 2014-09-12 MED ORDER — INSULIN ASPART 100 UNIT/ML ~~LOC~~ SOLN
0.0000 [IU] | Freq: Three times a day (TID) | SUBCUTANEOUS | Status: DC
  Administered 2014-09-13: 3 [IU] via SUBCUTANEOUS
  Administered 2014-09-13 – 2014-09-14 (×2): 2 [IU] via SUBCUTANEOUS
  Administered 2014-09-15 (×2): 3 [IU] via SUBCUTANEOUS

## 2014-09-12 MED ORDER — LEVOTHYROXINE SODIUM 25 MCG PO TABS
25.0000 ug | ORAL_TABLET | Freq: Every day | ORAL | Status: DC
  Administered 2014-09-13 – 2014-09-15 (×3): 25 ug via ORAL
  Filled 2014-09-12 (×4): qty 1

## 2014-09-12 MED ORDER — PHENOL 1.4 % MT LIQD: 1.0000 | OROMUCOSAL | Status: DC | PRN

## 2014-09-12 MED ORDER — DEXTROSE 5 % IV SOLN
500.0000 mg | Freq: Four times a day (QID) | INTRAVENOUS | Status: DC | PRN
  Filled 2014-09-12: qty 5

## 2014-09-12 MED ORDER — ONDANSETRON HCL 4 MG PO TABS: 4.0000 mg | ORAL_TABLET | Freq: Four times a day (QID) | ORAL | Status: DC | PRN

## 2014-09-12 MED ORDER — PATIENT'S GUIDE TO USING COUMADIN BOOK
Freq: Once | Status: DC
  Filled 2014-09-12: qty 1

## 2014-09-12 MED ORDER — METOCLOPRAMIDE HCL 10 MG PO TABS
5.0000 mg | ORAL_TABLET | Freq: Three times a day (TID) | ORAL | Status: DC | PRN
Start: 2014-09-12 — End: 2014-09-15

## 2014-09-12 MED ORDER — TRANEXAMIC ACID 100 MG/ML IV SOLN
1000.0000 mg | INTRAVENOUS | Status: DC | PRN
  Administered 2014-09-12: 1000 mg via INTRAVENOUS

## 2014-09-12 MED ORDER — FENTANYL CITRATE 0.05 MG/ML IJ SOLN
INTRAMUSCULAR | Status: DC | PRN
Start: 2014-09-12 — End: 2014-09-12
  Administered 2014-09-12 (×4): 50 ug via INTRAVENOUS

## 2014-09-12 MED ORDER — SODIUM CHLORIDE 0.9 % IR SOLN
Status: DC | PRN
  Administered 2014-09-12: 1000 mL

## 2014-09-12 MED ORDER — ALBUTEROL SULFATE (2.5 MG/3ML) 0.083% IN NEBU: 3.0000 mL | INHALATION_SOLUTION | Freq: Four times a day (QID) | RESPIRATORY_TRACT | Status: DC | PRN

## 2014-09-12 MED ORDER — SODIUM CHLORIDE 0.9 % IR SOLN
Status: DC | PRN
  Administered 2014-09-12: 3000 mL

## 2014-09-12 MED ORDER — TRAZODONE HCL 100 MG PO TABS: 100.0000 mg | ORAL_TABLET | Freq: Every day | ORAL | Status: DC

## 2014-09-12 MED ORDER — MIDAZOLAM HCL 5 MG/5ML IJ SOLN
INTRAMUSCULAR | Status: DC | PRN
  Administered 2014-09-12: 1 mg via INTRAVENOUS

## 2014-09-12 MED ORDER — ONDANSETRON HCL 4 MG/2ML IJ SOLN
INTRAMUSCULAR | Status: DC | PRN
  Administered 2014-09-12: 4 mg via INTRAVENOUS

## 2014-09-12 MED ORDER — MORPHINE SULFATE 4 MG/ML IJ SOLN
4.0000 mg | INTRAMUSCULAR | Status: DC | PRN
  Administered 2014-09-13 (×2): 4 mg via INTRAVENOUS
  Filled 2014-09-12 (×2): qty 1

## 2014-09-12 MED ORDER — FENTANYL CITRATE 0.05 MG/ML IJ SOLN
INTRAMUSCULAR | Status: AC
  Filled 2014-09-12: qty 5

## 2014-09-12 MED ORDER — ONDANSETRON HCL 4 MG/2ML IJ SOLN: 4.0000 mg | Freq: Once | INTRAMUSCULAR | Status: DC | PRN

## 2014-09-12 MED ORDER — BUPIVACAINE-EPINEPHRINE 0.5% -1:200000 IJ SOLN
INTRAMUSCULAR | Status: DC | PRN
  Administered 2014-09-12: 20 mL

## 2014-09-12 MED ORDER — SUCCINYLCHOLINE CHLORIDE 20 MG/ML IJ SOLN
INTRAMUSCULAR | Status: DC | PRN
  Administered 2014-09-12: 100 mg via INTRAVENOUS

## 2014-09-12 MED ORDER — CLONIDINE HCL (ANALGESIA) 100 MCG/ML EP SOLN
EPIDURAL | Status: DC | PRN
  Administered 2014-09-12: 1 mL via INTRA_ARTICULAR

## 2014-09-12 MED ORDER — BUPIVACAINE HCL (PF) 0.25 % IJ SOLN
INTRAMUSCULAR | Status: AC
  Filled 2014-09-12: qty 30

## 2014-09-12 MED ORDER — PURALUBE 85-15 % OP OINT: 1.0000 "application " | TOPICAL_OINTMENT | Freq: Every day | OPHTHALMIC | Status: DC

## 2014-09-12 MED ORDER — OXYCODONE HCL 5 MG/5ML PO SOLN
5.0000 mg | Freq: Once | ORAL | Status: AC | PRN
Start: 2014-09-12 — End: 2014-09-12

## 2014-09-12 MED ORDER — BUPIVACAINE-EPINEPHRINE (PF) 0.5% -1:200000 IJ SOLN
INTRAMUSCULAR | Status: AC
  Filled 2014-09-12: qty 30

## 2014-09-12 MED ORDER — BUPROPION HCL ER (XL) 300 MG PO TB24
300.0000 mg | ORAL_TABLET | Freq: Every day | ORAL | Status: DC
  Administered 2014-09-13 – 2014-09-15 (×3): 300 mg via ORAL
  Filled 2014-09-12 (×3): qty 1

## 2014-09-12 MED ORDER — METFORMIN HCL 500 MG PO TABS
500.0000 mg | ORAL_TABLET | Freq: Two times a day (BID) | ORAL | Status: DC
  Administered 2014-09-12 – 2014-09-15 (×6): 500 mg via ORAL
  Filled 2014-09-12 (×8): qty 1

## 2014-09-12 MED ORDER — ACETAMINOPHEN 325 MG PO TABS: 650.0000 mg | ORAL_TABLET | Freq: Four times a day (QID) | ORAL | Status: DC | PRN

## 2014-09-12 MED ORDER — CLONAZEPAM 0.5 MG PO TABS
0.5000 mg | ORAL_TABLET | Freq: Three times a day (TID) | ORAL | Status: DC
  Administered 2014-09-12 – 2014-09-14 (×6): 0.5 mg via ORAL
  Filled 2014-09-12 (×6): qty 1

## 2014-09-12 MED ORDER — MIDAZOLAM HCL 2 MG/2ML IJ SOLN
INTRAMUSCULAR | Status: AC
  Filled 2014-09-12: qty 2

## 2014-09-12 MED ORDER — TRAZODONE HCL 100 MG PO TABS
100.0000 mg | ORAL_TABLET | Freq: Every day | ORAL | Status: DC
  Administered 2014-09-12 – 2014-09-14 (×3): 100 mg via ORAL
  Filled 2014-09-12 (×4): qty 1

## 2014-09-12 MED ORDER — CEFAZOLIN SODIUM-DEXTROSE 2-3 GM-% IV SOLR
2.0000 g | Freq: Three times a day (TID) | INTRAVENOUS | Status: AC
  Administered 2014-09-12 (×2): 2 g via INTRAVENOUS
  Filled 2014-09-12 (×2): qty 50

## 2014-09-12 MED ORDER — DIPHENHYDRAMINE HCL 25 MG PO TABS: 25.0000 mg | ORAL_TABLET | Freq: Four times a day (QID) | ORAL | Status: DC

## 2014-09-12 MED ORDER — ONDANSETRON HCL 4 MG/2ML IJ SOLN: 4.0000 mg | Freq: Four times a day (QID) | INTRAMUSCULAR | Status: DC | PRN

## 2014-09-12 MED ORDER — CLONIDINE HCL (ANALGESIA) 100 MCG/ML EP SOLN
150.0000 ug | EPIDURAL | Status: DC
  Filled 2014-09-12: qty 1.5

## 2014-09-12 MED ORDER — CHLORHEXIDINE GLUCONATE 4 % EX LIQD
60.0000 mL | Freq: Once | CUTANEOUS | Status: DC
Start: 2014-09-12 — End: 2014-09-12
  Filled 2014-09-12: qty 60

## 2014-09-12 SURGICAL SUPPLY — 81 items
BANDAGE ELASTIC 4 VELCRO ST LF (GAUZE/BANDAGES/DRESSINGS) ×3 IMPLANT
BANDAGE ELASTIC 6 VELCRO ST LF (GAUZE/BANDAGES/DRESSINGS) ×3 IMPLANT
BANDAGE ESMARK 6X9 LF (GAUZE/BANDAGES/DRESSINGS) ×1 IMPLANT
BLADE SAG 18X100X1.27 (BLADE) ×3 IMPLANT
BLADE SAW SGTL 13.0X1.19X90.0M (BLADE) ×3 IMPLANT
BNDG COHESIVE 6X5 TAN STRL LF (GAUZE/BANDAGES/DRESSINGS) ×3 IMPLANT
BNDG ELASTIC 6X10 VLCR STRL LF (GAUZE/BANDAGES/DRESSINGS) ×9 IMPLANT
BNDG ESMARK 6X9 LF (GAUZE/BANDAGES/DRESSINGS) ×3
BOWL SMART MIX CTS (DISPOSABLE) ×3 IMPLANT
CEMENT BONE SIMPLEX SPEEDSET (Cement) ×6 IMPLANT
COVER SURGICAL LIGHT HANDLE (MISCELLANEOUS) ×3 IMPLANT
CUFF TOURNIQUET SINGLE 34IN LL (TOURNIQUET CUFF) IMPLANT
CUFF TOURNIQUET SINGLE 44IN (TOURNIQUET CUFF) IMPLANT
DRAPE INCISE IOBAN 66X45 STRL (DRAPES) IMPLANT
DRAPE ORTHO SPLIT 77X108 STRL (DRAPES) ×8
DRAPE SURG ORHT 6 SPLT 77X108 (DRAPES) ×4 IMPLANT
DRAPE U-SHAPE 47X51 STRL (DRAPES) ×3 IMPLANT
DRSG PAD ABDOMINAL 8X10 ST (GAUZE/BANDAGES/DRESSINGS) ×6 IMPLANT
DURAPREP 26ML APPLICATOR (WOUND CARE) ×3 IMPLANT
ELECT REM PT RETURN 9FT ADLT (ELECTROSURGICAL) ×3
ELECTRODE REM PT RTRN 9FT ADLT (ELECTROSURGICAL) ×1 IMPLANT
FACESHIELD WRAPAROUND (MASK) ×3 IMPLANT
GAUZE SPONGE 4X4 12PLY STRL (GAUZE/BANDAGES/DRESSINGS) ×3 IMPLANT
GAUZE XEROFORM 5X9 LF (GAUZE/BANDAGES/DRESSINGS) ×3 IMPLANT
GLOVE BIO SURGEON STRL SZ7.5 (GLOVE) ×6 IMPLANT
GLOVE BIOGEL PI IND STRL 7.5 (GLOVE) ×2 IMPLANT
GLOVE BIOGEL PI IND STRL 8 (GLOVE) ×1 IMPLANT
GLOVE BIOGEL PI INDICATOR 7.5 (GLOVE) ×4
GLOVE BIOGEL PI INDICATOR 8 (GLOVE) ×2
GLOVE ECLIPSE 7.0 STRL STRAW (GLOVE) ×6 IMPLANT
GLOVE SURG ORTHO 8.0 STRL STRW (GLOVE) ×3 IMPLANT
GOWN STRL REUS W/ TWL LRG LVL3 (GOWN DISPOSABLE) ×3 IMPLANT
GOWN STRL REUS W/ TWL XL LVL3 (GOWN DISPOSABLE) ×1 IMPLANT
GOWN STRL REUS W/TWL LRG LVL3 (GOWN DISPOSABLE) ×6
GOWN STRL REUS W/TWL XL LVL3 (GOWN DISPOSABLE) ×2
HANDPIECE INTERPULSE COAX TIP (DISPOSABLE) ×2
HOOD PEEL AWAY FACE SHEILD DIS (HOOD) ×12 IMPLANT
IMMOBILIZER KNEE 20 (SOFTGOODS) IMPLANT
IMMOBILIZER KNEE 22 UNIV (SOFTGOODS) IMPLANT
IMMOBILIZER KNEE 24 THIGH 36 (MISCELLANEOUS) ×1 IMPLANT
IMMOBILIZER KNEE 24 UNIV (MISCELLANEOUS) ×3
KIT BASIN OR (CUSTOM PROCEDURE TRAY) ×3 IMPLANT
KIT ROOM TURNOVER OR (KITS) ×3 IMPLANT
KNEE/VIT E POLY LINER LEVEL 1B ×3 IMPLANT
MANIFOLD NEPTUNE II (INSTRUMENTS) ×3 IMPLANT
NEEDLE 18GX1X1/2 (RX/OR ONLY) (NEEDLE) ×3 IMPLANT
NEEDLE 21X1 OR PACK (NEEDLE) ×3 IMPLANT
NEEDLE HYPO 22GX1.5 SAFETY (NEEDLE) ×3 IMPLANT
NEEDLE SPNL 18GX3.5 QUINCKE PK (NEEDLE) ×3 IMPLANT
NS IRRIG 1000ML POUR BTL (IV SOLUTION) ×6 IMPLANT
PACK TOTAL JOINT (CUSTOM PROCEDURE TRAY) ×3 IMPLANT
PAD ARMBOARD 7.5X6 YLW CONV (MISCELLANEOUS) ×6 IMPLANT
PAD CAST 4YDX4 CTTN HI CHSV (CAST SUPPLIES) ×1 IMPLANT
PADDING CAST ABS 6INX4YD NS (CAST SUPPLIES) ×2
PADDING CAST ABS COTTON 6X4 NS (CAST SUPPLIES) ×1 IMPLANT
PADDING CAST COTTON 4X4 STRL (CAST SUPPLIES) ×2
PADDING CAST COTTON 6X4 STRL (CAST SUPPLIES) ×9 IMPLANT
RUBBERBAND STERILE (MISCELLANEOUS) IMPLANT
SET HNDPC FAN SPRY TIP SCT (DISPOSABLE) ×1 IMPLANT
SPONGE GAUZE 4X4 12PLY STER LF (GAUZE/BANDAGES/DRESSINGS) ×3 IMPLANT
SPONGE LAP 18X18 X RAY DECT (DISPOSABLE) IMPLANT
STAPLER VISISTAT 35W (STAPLE) ×3 IMPLANT
SUCTION FRAZIER TIP 10 FR DISP (SUCTIONS) ×3 IMPLANT
SUT VIC AB 0 CTB1 27 (SUTURE) ×9 IMPLANT
SUT VIC AB 1 CT1 27 (SUTURE) ×10
SUT VIC AB 1 CT1 27XBRD ANBCTR (SUTURE) ×5 IMPLANT
SUT VIC AB 2-0 CT1 27 (SUTURE) ×10
SUT VIC AB 2-0 CT1 TAPERPNT 27 (SUTURE) ×5 IMPLANT
SUT VIC AB 3-0 SH 27 (SUTURE)
SUT VIC AB 3-0 SH 27X BRD (SUTURE) IMPLANT
SYR 30ML LL (SYRINGE) ×3 IMPLANT
SYR 30ML SLIP (SYRINGE) ×3 IMPLANT
SYR 50ML LL SCALE MARK (SYRINGE) ×3 IMPLANT
SYR TB 1ML LUER SLIP (SYRINGE) ×2 IMPLANT
SYRINGE 20CC LL (MISCELLANEOUS) ×6 IMPLANT
TOWEL OR 17X24 6PK STRL BLUE (TOWEL DISPOSABLE) ×6 IMPLANT
TOWEL OR 17X26 10 PK STRL BLUE (TOWEL DISPOSABLE) ×6 IMPLANT
TRAY FOLEY CATH 16FRSI W/METER (SET/KITS/TRAYS/PACK) ×3 IMPLANT
TUBE CONNECTING 12'X1/4 (SUCTIONS) ×1
TUBE CONNECTING 12X1/4 (SUCTIONS) ×2 IMPLANT
WATER STERILE IRR 1000ML POUR (IV SOLUTION) ×6 IMPLANT

## 2014-09-12 NOTE — Progress Notes (Signed)
Orthopedic Tech Progress Note Patient Details:  Amanda [name] 05/24/1946 [id number]  CPM Right Knee CPM Right Knee: On Right Knee Flexion (Degrees): 40 Right Knee Extension (Degrees): 0   Braulio Bosch 09/12/2014, 8:24 PM

## 2014-09-12 NOTE — Brief Op Note (Signed)
09/12/2014  10:31 AM  PATIENT:  Amanda [name]  68 y.o. female  PRE-OPERATIVE DIAGNOSIS:  RIGHT KNEE OSTEOARTHRITIS  POST-OPERATIVE DIAGNOSIS:  RIGHT KNEE OSTEOARTHRITIS  PROCEDURE:  Procedure(s): TOTAL KNEE ARTHROPLASTY  SURGEON:  Surgeon(s): Meredith Pel, MD  ASSISTANT: s vernon pa  ANESTHESIA:   general  EBL: 50 ml    Total I/O In: 1050 [I.V.:1050] Out: 840 [Urine:740; Blood:100]  BLOOD ADMINISTERED: none  DRAINS: none   LOCAL MEDICATIONS USED:  none  SPECIMEN:  No Specimen  COUNTS:  YES  TOURNIQUET:   Total Tourniquet Time Documented: Thigh (Right) - 99 minutes Total: Thigh (Right) - 99 minutes   DICTATION: .Other Dictation: Dictation Number [id number]  PLAN OF CARE: Admit to inpatient   PATIENT DISPOSITION:  PACU - hemodynamically stable

## 2014-09-12 NOTE — H&P (Signed)
TOTAL KNEE ADMISSION H&P  Patient is being admitted for right total knee arthroplasty.  Subjective:  Chief Complaint:right knee pain.  HPI: Amanda [name], 68 y.o. female, has a history of pain and functional disability in the right knee due to arthritis and has failed non-surgical conservative treatments for greater than 12 weeks to includeNSAID's and/or analgesics, corticosteriod injections, flexibility and strengthening excercises, use of assistive devices, weight reduction as appropriate and activity modification.  Onset of symptoms was gradual, starting >10 years ago with gradually worsening course since that time. The patient noted no past surgery on the right knee(s).  Patient currently rates pain in the right knee(s) at 9 out of 10 with activity. Patient has night pain, worsening of pain with activity and weight bearing, pain that interferes with activities of daily living, pain with passive range of motion, crepitus and joint swelling.  Patient has evidence of subchondral cysts, subchondral sclerosis, periarticular osteophytes and joint space narrowing by imaging studies. This patient has had a good result with right TKA. There is no active infection.  Patient Active Problem List   Diagnosis Date Noted  . Cough 08/31/2013   Past Medical History  Diagnosis Date  . Hypertension   . Sleep apnea     cpap  . Complication of anesthesia     "forgets to breathe" when she wakes up  . Vertigo   . Heart murmur   . Mitral valve prolapse   . Asthma     seasonal or with bronchitis  . Pneumonia     hx of  . Diabetes type 2, controlled     fasting 100-120  . Hypothyroidism   . Anxiety   . Depression   . GERD (gastroesophageal reflux disease)   . Headache   . Arthritis   . Hepatitis     A -- as teenager    Past Surgical History  Procedure Laterality Date  . Gallbladder surgery  1995  . Nasal sinus surgery  2005  . Throat surgery  2005  . Partial hysterectomy  1983  .  Cholecystectomy    . Abdominal hysterectomy    . Joint replacement Left     knee  . Fracture surgery Right     leg    Prescriptions prior to admission  Medication Sig Dispense Refill  . buPROPion (WELLBUTRIN XL) 300 MG 24 hr tablet Take 300 mg by mouth daily.        . Cholecalciferol (VITAMIN D3) 1000 UNITS CAPS Take 1,000 Units by mouth daily.       . clonazePAM (KLONOPIN) 0.5 MG tablet Take 0.5 mg by mouth 3 (three) times daily.       . hydrochlorothiazide (MICROZIDE) 12.5 MG capsule Take 12.5 mg by mouth daily.        Marland Kitchen HYDROcodone-acetaminophen (NORCO/VICODIN) 5-325 MG per tablet Take 0.5-1 tablets by mouth daily as needed for moderate pain.      Marland Kitchen levothyroxine (SYNTHROID, LEVOTHROID) 25 MCG tablet Take 25 mcg by mouth daily.        Marland Kitchen losartan (COZAAR) 25 MG tablet Take 12.5 mg by mouth daily.       . metFORMIN (GLUCOPHAGE) 500 MG tablet Take 500 mg by mouth 2 (two) times daily with a meal.       . metoprolol (LOPRESSOR) 50 MG tablet Take 25 mg by mouth 2 (two) times daily.        Marland Kitchen omeprazole (PRILOSEC) 40 MG capsule Take 40 mg by mouth daily.        Marland Kitchen  OVER THE COUNTER MEDICATION Place 1 drop into both eyes 4 (four) times daily. FreshKote Lubricant Eye Drop      . simvastatin (ZOCOR) 10 MG tablet Take 10 mg by mouth at bedtime.        . traZODone (DESYREL) 100 MG tablet Take 100-200 mg by mouth at bedtime.      Marland Kitchen venlafaxine (EFFEXOR) 50 MG tablet Take 100 mg by mouth daily.      Dema Severin Petrolatum-Mineral Oil (PURALUBE) 85-15 % OINT Apply 1 application to eye daily.      Marland Kitchen albuterol (PROVENTIL HFA;VENTOLIN HFA) 108 (90 BASE) MCG/ACT inhaler Inhale 1 puff into the lungs every 6 (six) hours as needed for wheezing or shortness of breath.      . chlorpheniramine (CHLOR-TRIMETON) 4 MG tablet Take 4 mg by mouth at bedtime.       Allergies  Allergen Reactions  . Aciphex [Rabeprazole Sodium] Hives  . Erythromycin Nausea And Vomiting  . Macrodantin Nausea And Vomiting  . Nsaids      Bleeding in bladder  . Omeprazole-Sodium Bicarbonate [Omeprazole-Sodium Bicarbonate] Hives  . Sulfa Antibiotics Nausea Only and Other (See Comments)    Feel terrible  . Tape Rash    History  Substance Use Topics  . Smoking status: Never Smoker   . Smokeless tobacco: Never Used  . Alcohol Use: No     Comment: socially    Family History  Problem Relation Age of Onset  . Cancer Mother     panceratic  . Cancer - Colon Mother      Review of Systems  Constitutional: Negative.   HENT: Negative.   Eyes: Negative.   Respiratory: Negative.   Cardiovascular: Negative.   Gastrointestinal: Negative.   Genitourinary: Negative.   Musculoskeletal: Positive for joint pain.  Skin: Negative.   Neurological: Negative.   Endo/Heme/Allergies: Negative.   Psychiatric/Behavioral: Negative.     Objective:  Physical Exam  Constitutional: She appears well-developed.  HENT:  Head: Normocephalic.  Eyes: Pupils are equal, round, and reactive to light.  Neck: Normal range of motion.  Cardiovascular: Normal rate.   Respiratory: Effort normal.  Neurological: She is alert.  Skin: Skin is warm.  Psychiatric: She has a normal mood and affect.  right knee has intact skin - dp 2/4 - df pf ok - collaterals stable - rom 5 - 120 - extensor mechanism ok  Vital signs in last 24 hours: Temp:  [98 F (36.7 C)] 98 F (36.7 C) (10/27 0558) Pulse Rate:  [57] 57 (10/27 0558) Resp:  [20] 20 (10/27 0558) BP: (120)/(44) 120/44 mmHg (10/27 0558) SpO2:  [99 %] 99 % (10/27 0558) Weight:  [102.513 kg (226 lb)] 102.513 kg (226 lb) (10/27 0558)  Labs:   Estimated body mass index is 38.21 kg/(m^2) as calculated from the following:   Height as of this encounter: 5' 4.5" (1.638 m).   Weight as of this encounter: 102.513 kg (226 lb).   Imaging Review Plain radiographs demonstrate moderate degenerative joint disease of the right knee(s). The overall alignment ismild varus. The bone quality appears to be good  for age and reported activity level.  Assessment/Plan:  End stage arthritis, right knee   The patient history, physical examination, clinical judgment of the provider and imaging studies are consistent with end stage degenerative joint disease of the right knee(s) and total knee arthroplasty is deemed medically necessary. The treatment options including medical management, injection therapy arthroscopy and arthroplasty were discussed at length. The risks  and benefits of total knee arthroplasty were presented and reviewed. The risks due to aseptic loosening, infection, stiffness, patella tracking problems, thromboembolic complications and other imponderables were discussed. The patient acknowledged the explanation, agreed to proceed with the plan and consent was signed. Patient is being admitted for inpatient treatment for surgery, pain control, PT, OT, prophylactic antibiotics, VTE prophylaxis, progressive ambulation and ADL's and discharge planning. The patient is planning to be discharged to skilled nursing facility

## 2014-09-12 NOTE — Anesthesia Preprocedure Evaluation (Addendum)
Anesthesia Evaluation  Patient identified by MRN, date of birth, ID band Patient awake    Reviewed: Allergy & Precautions, H&P , NPO status , Patient's Chart, lab work & pertinent test results, reviewed documented beta blocker date and time   Airway Mallampati: III  TM Distance: >3 FB Neck ROM: Full    Dental  (+) Teeth Intact, Dental Advisory Given   Pulmonary asthma , sleep apnea and Continuous Positive Airway Pressure Ventilation ,  breath sounds clear to auscultation        Cardiovascular hypertension, Rhythm:Regular Rate:Normal     Neuro/Psych Anxiety Depression    GI/Hepatic GERD-  ,(+) Hepatitis -  Endo/Other  diabetes, Well Controlled, Type 2, Oral Hypoglycemic AgentsHypothyroidism Morbid obesity  Renal/GU      Musculoskeletal  (+) Arthritis -,   Abdominal   Peds  Hematology negative hematology ROS (+)   Anesthesia Other Findings   Reproductive/Obstetrics                         Anesthesia Physical Anesthesia Plan  ASA: III  Anesthesia Plan: General   Post-op Pain Management:    Induction: Intravenous  Airway Management Planned: Oral ETT  Additional Equipment:   Intra-op Plan:   Post-operative Plan: Extubation in OR  Informed Consent: I have reviewed the patients History and Physical, chart, labs and discussed the procedure including the risks, benefits and alternatives for the proposed anesthesia with the patient or authorized representative who has indicated his/her understanding and acceptance.     Plan Discussed with: CRNA and Surgeon  Anesthesia Plan Comments:       Anesthesia Quick Evaluation

## 2014-09-12 NOTE — Anesthesia Postprocedure Evaluation (Signed)
  Anesthesia Post-op Note  Patient: Amanda [name]  Procedure(s) Performed: Procedure(s): TOTAL KNEE ARTHROPLASTY (Right)  Patient Location: PACU  Anesthesia Type:GA combined with regional for post-op pain  Level of Consciousness: awake, alert  and oriented  Airway and Oxygen Therapy: Patient Spontanous Breathing  Post-op Pain: mild  Post-op Assessment: Post-op Vital signs reviewed  Post-op Vital Signs: Reviewed  Last Vitals:  Filed Vitals:   09/12/14 1130  BP: 154/63  Pulse: 63  Temp:   Resp: 21    Complications: No apparent anesthesia complications

## 2014-09-12 NOTE — Progress Notes (Signed)
ANTICOAGULATION CONSULT NOTE - Initial Consult  Pharmacy Consult for Coumadin Indication: VTE prophylaxis  Allergies  Allergen Reactions  . Aciphex [Rabeprazole Sodium] Hives  . Erythromycin Nausea And Vomiting  . Macrodantin Nausea And Vomiting  . Nsaids     Bleeding in bladder  . Omeprazole-Sodium Bicarbonate [Omeprazole-Sodium Bicarbonate] Hives  . Sulfa Antibiotics Nausea Only and Other (See Comments)    Feel terrible  . Tape Rash    Patient Measurements: Height: 5' 4.5" (163.8 cm) Weight: 226 lb (102.513 kg) IBW/kg (Calculated) : 55.85  Vital Signs: Temp: 97.6 F (36.4 C) (10/27 1621) Temp Source: Oral (10/27 1621) BP: 143/55 mmHg (10/27 1621) Pulse Rate: 72 (10/27 1621)   Medical History: Past Medical History  Diagnosis Date  . Hypertension   . Sleep apnea     cpap  . Complication of anesthesia     "forgets to breathe" when she wakes up  . Vertigo   . Heart murmur   . Mitral valve prolapse   . Asthma     seasonal or with bronchitis  . Pneumonia     hx of  . Diabetes type 2, controlled     fasting 100-120  . Hypothyroidism   . Anxiety   . Depression   . GERD (gastroesophageal reflux disease)   . Headache   . Arthritis   . Hepatitis     A -- as teenager    Assessment: 68 year old female to begin Coumadin for VTE prophylaxis s/p TKA Baseline INR = 0.99 Weight = 102 kg  Goal of Therapy:  INR 2-3 Monitor platelets by anticoagulation protocol: Yes   Plan:  Coumadin 7.5 mg po x 1 dose tonight Daily INR Coumadin education  Thank you. Anette Guarneri, PharmD [phone]  09/12/2014,5:24 PM

## 2014-09-12 NOTE — Anesthesia Procedure Notes (Addendum)
Anesthesia Regional Block:  Adductor canal block  Pre-Anesthetic Checklist: ,, timeout performed, Correct Patient, Correct Site, Correct Laterality, Correct Procedure, Correct Position, site marked, Risks and benefits discussed,  Surgical consent,  Pre-op evaluation,  At surgeon's request and post-op pain management  Laterality: Right  Prep: chloraprep       Needles:  Injection technique: Single-shot  Needle Type: Echogenic Needle     Needle Length: 9cm 9 cm Needle Gauge: 21 and 21 G    Additional Needles:  Procedures: ultrasound guided (picture in chart) Adductor canal block Narrative:  Start time: 09/12/2014 7:10 AM End time: 09/12/2014 7:20 AM Injection made incrementally with aspirations every 5 mL.  Performed by: Personally  Anesthesiologist: Suzette Battiest, MD  Additional Notes: Risks and benefits explained. Procedure performed under sterile conditions. Pt tolerated the procedure well with no immediate complications.   Procedure Name: Intubation Date/Time: 09/12/2014 7:42 AM Performed by: Terrill Mohr Pre-anesthesia Checklist: Patient identified, Emergency Drugs available, Suction available and Patient being monitored Patient Re-evaluated:Patient Re-evaluated prior to inductionOxygen Delivery Method: Circle system utilized Preoxygenation: Pre-oxygenation with 100% oxygen Intubation Type: IV induction Ventilation: Mask ventilation without difficulty Laryngoscope Size: Mac and 3 Grade View: Grade II Tube type: Oral Tube size: 7.5 mm Number of attempts: 1 Airway Equipment and Method: Stylet Placement Confirmation: ETT inserted through vocal cords under direct vision,  breath sounds checked- equal and bilateral and positive ETCO2 Secured at: 21 (cm at teeth) cm Tube secured with: Tape Dental Injury: Teeth and Oropharynx as per pre-operative assessment

## 2014-09-12 NOTE — Op Note (Signed)
NAME:  Amanda [name], Amanda [name] NO.:  [id number]  MEDICAL RECORD NO.:  [id number]  LOCATION:  [facility]                         FACILITY:  [facility]  PHYSICIAN:  Anderson Malta, M.D.    DATE OF BIRTH:  1946/06/04  DATE OF PROCEDURE: DATE OF DISCHARGE:                              OPERATIVE REPORT   PREOPERATIVE DIAGNOSIS:  Right knee arthritis.  POSTOPERATIVE DIAGNOSIS:  Right knee arthritis.  PROCEDURE:  Right total knee replacement, Stryker Triathlon posterior cruciate retaining 2 femur, 2 tibia, 11 poly insert deepest with 29-mm offset, 3 pegged cemented patella.  SURGEON:  Anderson Malta, M.D.  ASSISTANT:  Epimenio Foot, PA-C  ANESTHESIA:  General.  INDICATIONS:  Amanda [name] is a 69 year old patient with end-stage right knee arthritis, presents for operative management after explanation of risks and benefits.  Knee arthritis was present in all 3 compartments.  PROCEDURE IN DETAIL:  The patient was brought to the operating room where general anesthesia was induced.  Preoperative antibiotics were administered.  Time-out was called.  Right leg was prescrubbed with alcohol and Betadine, and allowed to air dry, prepped with DuraPrep solution and draped in a sterile manner.  Amanda [name] was used to cover the operative field.  Leg was elevated and exsanguinated with an Esmarch wrap.  Tourniquet was inflated.  Anterior approach to knee was made. Skin and subcutaneous tissue were sharply divided.  Median parapatellar approach was made with the knee in flexion, marked with a #1 Vicryl suture.  Lateral patellofemoral ligament was released.  Fat pad partially excised.  Soft tissue removed the anterior distal femur.  ACL was released.  Intramedullary alignment was then used to cut a 4-mm resection off of at least affected tibial side that was where the tibial cut was made 4 mm off the most affected medial tibial plateau.  Collaterals and posterior neurovascular structures were  protected. Distal femur was then cut in 5 degrees valgus, 8 mm, 9 spacer fit within the extension gap at that time.  Chamfer cutting block was then performed after sizing the size 2.  Following chamfer cuts and the anterior posterior cuts with the collaterals protected, the tibia was keel punched.  Trial reduction was performed.  The patient achieved full extension and full flexion with excellent patellar tracking with the trial components in position and 9 spacer in position.  Patella was then cut from 20-12 and then a 9 mm off the patella was placed.  With trial components in position, the patient had good stability to varus and valgus stress at 0, 30 and 90 degrees.  Patella had excellent tracking using no thumbs technique.  At this time, thorough irrigation was then performed after trial components were removed.  Exparel placed into the knee capsule.  Irrigation with 3 liters of irrigating solution was performed.  Cement was placed and the components were cemented into position.  Excess cement was removed.  Same stability parameters were maintained with an 11-mm spacer, which gave full extension, full flexion, and improved stability after the implants were placed. Tourniquet was released.  Bleeding points were encountered and controlled with electrocautery.  Thorough irrigation was again performed.  The knee incision was closed  in flexion using #1 Vicryl suture, 0 Vicryl suture, 2-0 Vicryl suture and skin staples.  The patient tolerated the procedure well without immediate complications. Skin edges were anesthetized using plain Marcaine.  Solution of Marcaine, morphine, clonidine injected into the knee joint itself for additional postop pain relief, bulky dressing, knee immobilizer placed. Amanda [name]'s assistance was required at all times during the case for retraction, opening, closing and protection of neurovascular structures. Her assistance was a medical necessity.     Anderson Malta, M.D.     GSD/MEDQ  D:  09/12/2014  T:  09/12/2014  Job:  [id number]

## 2014-09-12 NOTE — Transfer of Care (Signed)
Immediate Anesthesia Transfer of Care Note  Patient: Amanda [name]  Procedure(s) Performed: Procedure(s): TOTAL KNEE ARTHROPLASTY (Right)  Patient Location: PACU  Anesthesia Type:General  Level of Consciousness: awake, alert , oriented and patient cooperative  Airway & Oxygen Therapy: Patient Spontanous Breathing and Patient connected to face mask oxygen  Post-op Assessment: Report given to PACU RN and Post -op Vital signs reviewed and stable  Post vital signs: Reviewed and stable  Complications: No apparent anesthesia complications

## 2014-09-13 LAB — BASIC METABOLIC PANEL
ANION GAP: 10 (ref 5–15)
BUN: 12 mg/dL (ref 6–23)
CO2: 28 mEq/L (ref 19–32)
CREATININE: 1 mg/dL (ref 0.50–1.10)
Calcium: 8.5 mg/dL (ref 8.4–10.5)
Chloride: 96 mEq/L (ref 96–112)
GFR calc non Af Amer: 56 mL/min — ABNORMAL LOW (ref >90)
GFR, EST AFRICAN AMERICAN: 65 mL/min — AB (ref >90)
Glucose, Bld: 155 mg/dL — ABNORMAL HIGH (ref 70–99)
POTASSIUM: 4.2 meq/L (ref 3.7–5.3)
Sodium: 134 mEq/L — ABNORMAL LOW (ref 137–147)

## 2014-09-13 LAB — CBC
HCT: 32.5 % — ABNORMAL LOW (ref 36.0–46.0)
Hemoglobin: 10.6 g/dL — ABNORMAL LOW (ref 12.0–15.0)
MCH: 28.5 pg (ref 26.0–34.0)
MCHC: 32.6 g/dL (ref 30.0–36.0)
MCV: 87.4 fL (ref 78.0–100.0)
PLATELETS: 273 10*3/uL (ref 150–400)
RBC: 3.72 MIL/uL — ABNORMAL LOW (ref 3.87–5.11)
RDW: 12.8 % (ref 11.5–15.5)
WBC: 11.5 10*3/uL — AB (ref 4.0–10.5)

## 2014-09-13 LAB — GLUCOSE, CAPILLARY
GLUCOSE-CAPILLARY: 154 mg/dL — AB (ref 70–99)
GLUCOSE-CAPILLARY: 149 mg/dL — AB (ref 70–99)
Glucose-Capillary: 105 mg/dL — ABNORMAL HIGH (ref 70–99)
Glucose-Capillary: 128 mg/dL — ABNORMAL HIGH (ref 70–99)

## 2014-09-13 LAB — PROTIME-INR
INR: 1.19 (ref 0.00–1.49)
Prothrombin Time: 15.2 seconds (ref 11.6–15.2)

## 2014-09-13 MED ORDER — WARFARIN SODIUM 7.5 MG PO TABS
7.5000 mg | ORAL_TABLET | Freq: Once | ORAL | Status: AC
  Administered 2014-09-13: 7.5 mg via ORAL
  Filled 2014-09-13: qty 1

## 2014-09-13 NOTE — Clinical Social Work Psychosocial (Signed)
Clinical Social Work Department BRIEF PSYCHOSOCIAL ASSESSMENT 09/13/2014  Patient:  Amanda [name], Amanda [name]     Account Number:  [id number]     Admit date:  09/12/2014  Clinical Social Worker:  Delrae Sawyers  Date/Time:  09/13/2014 12:17 PM  Referred by:  Physician  Date Referred:  09/13/2014 Referred for  SNF Placement   Other Referral:   none.   Interview type:  Patient Other interview type:   none.    PSYCHOSOCIAL DATA Living Status:  HUSBAND Admitted from facility:   Level of care:   Primary support name:  husband, no name provided. Primary support relationship to patient:  SPOUSE Degree of support available:   Adequate support system. Pt reports having children, but states [place].    CURRENT CONCERNS Current Concerns  Post-Acute Placement   Other Concerns:   none.    SOCIAL WORK ASSESSMENT / PLAN CSW received referral for possible SNF placement at time of discharge. CSW spoke with pt regarding discharge disposition. Pt stated she would like to be discharged to [facility] once medically stable. Pt informed CSW that pt's husband is currently a resident at [facility]. CSW to continue to follow and assist with discharge planning needs.   Assessment/plan status:  Psychosocial Support/Ongoing Assessment of Needs Other assessment/ plan:   none.   Information/referral to community resources:   [facility] bed offers.    PATIENT'S/FAMILY'S RESPONSE TO PLAN OF CARE: Pt understanding and agreeable to CSW plan of care. Pt expressed no further questions or concerns at this time.       Lubertha Sayres, [place] ([phone]) Licensed Clinical Social Worker Orthopedics [phone]) and Surgical [phone])

## 2014-09-13 NOTE — Plan of Care (Signed)
Problem: Consults Goal: Diagnosis- Total Joint Replacement Outcome: Completed/Met Date Met:  09/13/14 Primary Total Knee Right

## 2014-09-13 NOTE — Clinical Social Work Placement (Addendum)
Clinical Social Work Department CLINICAL SOCIAL WORK PLACEMENT NOTE 09/13/2014  Patient:  Amanda [name], Amanda [name]  Account Number:  [id number] Admit date:  09/12/2014  Clinical Social Worker:  Delrae Sawyers  Date/time:  09/13/2014 12:25 PM  Clinical Social Work is seeking post-discharge placement for this patient at the following level of care:   SKILLED NURSING   (*CSW will update this form in Epic as items are completed)   09/13/2014  Patient/family provided with [place] Department of Clinical Social Work's list of facilities offering this level of care within the geographic area requested by the patient (or if unable, by the patient's family).  09/13/2014  Patient/family informed of their freedom to choose among providers that offer the needed level of care, that participate in Medicare, Medicaid or managed care program needed by the patient, have an available bed and are willing to accept the patient.  09/13/2014  Patient/family informed of [facility]' ownership interest in [facility], as well as of the fact that they are under no obligation to receive care at this facility.  PASARR submitted to EDS on 09/13/2014 PASARR number received on 09/13/2014  FL2 transmitted to all facilities in geographic area requested by pt/family on  09/13/2014 FL2 transmitted to all facilities within larger geographic area on   Patient informed that his/her managed care company has contracts with or will negotiate with  certain facilities, including the following:     Patient/family informed of bed offers received:  09/13/2014 Patient chooses bed at  Physician recommends and patient chooses bed at  [facility], [place]  Patient to be transferred to [facility], [place] on  09/15/2014 Patient to be transferred to facility by [facility] Patient and family notified of transfer on 09/15/2014 Name of family member notified:  Pt notified.  The following physician  request were entered in Epic:   Additional Comments:  [place] [phone]) Licensed Clinical Social Worker Orthopedics [phone]) and Surgical [phone])

## 2014-09-13 NOTE — Progress Notes (Signed)
Physical Therapy Treatment Patient Details Name: Amanda [name] MRN: [id number] DOB: 1946/06/04 Today's Date: 09/13/2014    History of Present Illness 68 yo female with new TKA RLE after OA failed conservative management with pmhx of fracture RLE    PT Comments    Pt was seen for afternoon visit with continued lethargy, although is much more capable than this AM.  Her plan is to go to SNF and hopefully will be standing and stepping in the AM to increase competence for therapy.  Continue BID as able.  Follow Up Recommendations  SNF;Supervision/Assistance - 24 hour     Equipment Recommendations  Rolling walker with 5" wheels    Recommendations for Other Services       Precautions / Restrictions Precautions Precautions: Knee Precaution Booklet Issued: No Precaution Comments: reviewed use of brace OOB; pt very lethargic  Required Braces or Orthoses: Knee Immobilizer - Right Knee Immobilizer - Right: Discontinue once straight leg raise with < 10 degree lag Restrictions Weight Bearing Restrictions: Yes RLE Weight Bearing: Weight bearing as tolerated    Mobility  Bed Mobility Overal bed mobility: Needs Assistance;+2 for physical assistance;+ 2 for safety/equipment (getting INTO bed) Bed Mobility: Supine to Sit;Sit to Supine     Supine to sit: Max assist Sit to supine: Max assist;+2 for physical assistance;+2 for safety/equipment   General bed mobility comments: Pain complaints about her R knee, difficulty sequencing and managing her RLE.  Continual cues for keeping on task  Transfers Overall transfer level: Needs assistance Equipment used: 2 person hand held assist;Rolling walker (2 wheeled) Transfers: Sit to/from Stand Sit to Stand: Max assist;+2 physical assistance;+2 safety/equipment         General transfer comment: verbal and tactile cueing needed for positioning of hands with rw  Ambulation/Gait                 Stairs            Wheelchair  Mobility    Modified Rankin (Stroke Patients Only)       Balance Overall balance assessment: Needs assistance Sitting-balance support: Bilateral upper extremity supported;Single extremity supported Sitting balance-Leahy Scale: Poor Sitting balance - Comments: physical A to maintain balance EOB with pt lateral leaning due to lethergy, verbal and tactile cues needed to prompt correction of lean   Standing balance support: Bilateral upper extremity supported;During functional activity Standing balance-Leahy Scale: Poor Standing balance comment: pt unable to come to complete standing, max cues for technique, physical A to maintain balance with rw                    Cognition Arousal/Alertness: Lethargic Behavior During Therapy: Flat affect Overall Cognitive Status: Impaired/Different from baseline Area of Impairment: Attention;Following commands;Safety/judgement;Awareness;Problem solving   Current Attention Level: Divided Memory: Decreased recall of precautions Following Commands: Follows one step commands inconsistently;Follows one step commands with increased time Safety/Judgement: Decreased awareness of safety Awareness: Anticipatory Problem Solving: Slow processing;Decreased initiation;Difficulty sequencing;Requires verbal cues;Requires tactile cues General Comments: Still keeping eyes shut for majority of session and needed continual prompting to stay awake and keep sitting balance control    Exercises General Exercises - Lower Extremity Ankle Circles/Pumps: AROM;Both;10 reps Quad Sets: AROM;Left;10 reps Heel Slides: AROM;Both;10 reps Hip ABduction/ADduction: AROM;AAROM;Both;10 reps Straight Leg Raises: Right;AAROM;10 reps    General Comments General comments (skin integrity, edema, etc.): Pt was less medicated but continues to be sleepy and says she cannot understand why.  Pt has no recall of the morning session.  Pertinent Vitals/Pain Pain Assessment:  Faces Faces Pain Scale: Hurts whole lot Pain Location: R knee Pain Intervention(s): Limited activity within patient's tolerance;Monitored during session;Premedicated before session;Repositioned    Home Living Family/patient expects to be discharged to:: Skilled nursing facility Living Arrangements: Alone             Additional Comments: 2 level home; pt able to live on main level; 14 steps to 2nd level; 2 steps with no rails to enter home    Prior Function Level of Independence: Independent with assistive device(s)      Comments: used AD at times to ambulate; no additional assistance with ADLS   PT Goals (current goals can now be found in the care plan section) Acute Rehab PT Goals Patient Stated Goal: not able to state Progress towards PT goals: Progressing toward goals    Frequency  7X/week    PT Plan Current plan remains appropriate    Co-evaluation   Reason for Co-Treatment: For patient/therapist safety;Other (comment) (Necessary to address cognition/behavior during functional ac)   OT goals addressed during session: ADL's and self-care     End of Session Equipment Utilized During Treatment: Gait belt;Oxygen Activity Tolerance: Patient limited by lethargy;Patient limited by pain Patient left: in bed;with call bell/phone within reach;with nursing/sitter in room     Time: 1600-1631 PT Time Calculation (min): 31 min  Charges:  $Therapeutic Exercise: 8-22 mins $Therapeutic Activity: 8-22 mins                    G Codes:      Ramond Dial 2014-09-25, 4:44 PM  Mee Hives, PT MS Acute Rehab Dept. Number: [phone]

## 2014-09-13 NOTE — Progress Notes (Signed)
OT Cancellation Note  Patient Details Name: LASHELL MOFFITT MRN: [id number] DOB: 24-Oct-1946   Cancelled Treatment:    Reason Eval/Treat Not Completed: Pain limiting ability to participate. Nursing administering pain med. OT to reattempt.  Hortencia Pilar 09/13/2014, 10:04 AM

## 2014-09-13 NOTE — Progress Notes (Signed)
Orthopedic Tech Progress Note Patient Details:  GLENDINE SWETZ [birth year] [id number]  Patient ID: Judie Bonus, female   DOB: Dec 20, 1945, 68 y.o.   MRN: [id number] Placed pt's rle in cpm @ 0-45 degrees@1450   Hildred Priest 09/13/2014, 2:52 PM

## 2014-09-13 NOTE — Evaluation (Signed)
Occupational Therapy Evaluation Patient Details Name: Amanda [name] MRN: [id number] DOB: [birth year] Today's Date: 09/13/2014    History of Present Illness 68 yo female with new TKA RLE after OA failed conservative management with pmhx of fracture RLE   Clinical Impression   Pt admitted with the above diagnoses and presents with below problem list. Pt will benefit from continued acute  OT to address the below listed deficits and maximize independence with basic ADLs prior to d/c to next venue. PTA pt was mod I with ADLs. This session pt is at +2 max A for LB ADLs. Pt limited by lethargy and pain. Recommending SNF prior to returning home.    Follow Up Recommendations  SNF    Equipment Recommendations  Other (comment) (defer to next venue)    Recommendations for Other Services       Precautions / Restrictions Precautions Precautions: Knee Precaution Booklet Issued: No Precaution Comments: reviewed use of brace OOB; pt very lethargic  Required Braces or Orthoses: Knee Immobilizer - Right Knee Immobilizer - Right: Discontinue once straight leg raise with < 10 degree lag Restrictions Weight Bearing Restrictions: Yes RLE Weight Bearing: Weight bearing as tolerated      Mobility Bed Mobility Overal bed mobility: Needs Assistance;+2 for physical assistance;+ 2 for safety/equipment Bed Mobility: Supine to Sit;Sit to Supine     Supine to sit: +2 for physical assistance;+2 for safety/equipment Sit to supine: +2 for physical assistance;+2 for safety/equipment   General bed mobility comments: Pt limited by lethargy and pain during sit<>supine  Transfers Overall transfer level: Needs assistance Equipment used: 2 person hand held assist;Rolling walker (2 wheeled) Transfers: Sit to/from Stand Sit to Stand: Max assist;+2 physical assistance;+2 safety/equipment         General transfer comment: verbal and tactile cueing needed for positioning of hands with rw    Balance  Overall balance assessment: Needs assistance Sitting-balance support: Bilateral upper extremity supported;Feet supported Sitting balance-Leahy Scale: Poor Sitting balance - Comments: physical A to maintain balance EOB with pt lateral leaning due to lethergy, verbal and tactile cues needed to prompt correction of lean Postural control: Posterior lean Standing balance support: Bilateral upper extremity supported;During functional activity Standing balance-Leahy Scale: Poor Standing balance comment: pt unable to come to complete standing, max cues for technique, physical A to maintain balance with rw                            ADL Overall ADL's : Needs assistance/impaired Eating/Feeding: Set up;Bed level   Grooming: Set up;Bed level   Upper Body Bathing: Moderate assistance;Bed level   Lower Body Bathing: Maximal assistance;Bed level;+2 for physical assistance   Upper Body Dressing : Moderate assistance;Bed level   Lower Body Dressing: Maximal assistance;+2 for physical assistance;Bed level   Toilet Transfer:  (bed level)   Toileting- Clothing Manipulation and Hygiene:  (bed level)   Tub/ Shower Transfer:  (bed level)     General ADL Comments: Pt limitied by lethargy and pain this session. ADL education attempted with pt but limited by lethargy.       Vision                     Perception     Praxis      Pertinent Vitals/Pain Pain Assessment: Faces Faces Pain Scale: Hurts whole lot Pain Location: Rt knee Pain Intervention(s): Limited activity within patient's tolerance;Monitored during session;Premedicated before session;Repositioned;Ice applied;Other (comment) (Nursing in  to observe and check on meds))     Hand Dominance     Extremity/Trunk Assessment Upper Extremity Assessment Upper Extremity Assessment: Difficult to assess due to impaired cognition   Lower Extremity Assessment Lower Extremity Assessment: Defer to PT evaluation   Cervical /  Trunk Assessment Cervical / Trunk Assessment: Normal   Communication Communication Communication: No difficulties   Cognition Arousal/Alertness: Lethargic Behavior During Therapy: Flat affect Overall Cognitive Status: Impaired/Different from baseline Area of Impairment: Attention;Following commands;Safety/judgement;Awareness;Problem solving;Orientation   Current Attention Level: Divided Memory: Decreased recall of precautions Following Commands: Follows one step commands inconsistently;Follows one step commands with increased time Safety/Judgement: Decreased awareness of safety Awareness: Anticipatory Problem Solving: Slow processing;Decreased initiation;Difficulty sequencing;Requires verbal cues;Requires tactile cues General Comments: Pt unable to keep eyes open for functional length of time. Pt stating " I can not keep my eyes open." Pt at times giving tangential response due to lethargy. Unable to maintain standing at bedside for more than a brief interval with tactile and verbal alerting input.    General Comments       Exercises       Shoulder Instructions      Home Living Family/patient expects to be discharged to:: Skilled nursing facility Living Arrangements: Alone                               Additional Comments: 2 level home; pt able to live on main level; 14 steps to 2nd level; 2 steps with no rails to enter home      Prior Functioning/Environment Level of Independence: Independent with assistive device(s)        Comments: used AD at times to ambulate; no additional assistance with ADLS    OT Diagnosis: Generalized weakness;Acute pain   OT Problem List: Decreased activity tolerance;Impaired balance (sitting and/or standing);Decreased knowledge of use of DME or AE;Decreased knowledge of precautions;Obesity;Pain   OT Treatment/Interventions: Self-care/ADL training;Energy conservation;DME and/or AE instruction;Therapeutic activities;Patient/family  education;Balance training    OT Goals(Current goals can be found in the care plan section) Acute Rehab OT Goals Patient Stated Goal: not able to state OT Goal Formulation: With patient Time For Goal Achievement: 09/20/14 Potential to Achieve Goals: Fair ADL Goals Pt Will Perform Lower Body Dressing: with adaptive equipment;with min assist;sit to/from stand Pt Will Transfer to Toilet: with min assist;ambulating;stand pivot transfer;bedside commode Additional ADL Goal #1: Pt will complete supine<>EOB with min A to prepare for OOB ADLs.  OT Frequency: Min 2X/week   Barriers to D/C:            Co-evaluation PT/OT/SLP Co-Evaluation/Treatment: Yes Reason for Co-Treatment: For patient/therapist safety;Other (comment) (Necessary to address cognition/behavior during functional ac) PT goals addressed during session: Mobility/safety with mobility;Proper use of DME;Other (comment) (gait sequencing) OT goals addressed during session: ADL's and self-care      End of Session Equipment Utilized During Treatment: Gait belt;Rolling walker;Oxygen;Right knee immobilizer Nurse Communication: Other (comment);Mobility status (nrsg observed lethargy and pt c/o pain)  Activity Tolerance: Patient limited by lethargy;Patient limited by pain Patient left: in bed;with call bell/phone within reach   Time: 1200-1245 OT Time Calculation (min): 45 min Charges:  OT General Charges $OT Visit: 1 Procedure OT Evaluation $Initial OT Evaluation Tier I: 1 Procedure OT Treatments $Self Care/Home Management : 8-22 mins G-Codes:    Hortencia Pilar 2014/10/07, 2:01 PM

## 2014-09-13 NOTE — Progress Notes (Signed)
Orthopedic Tech Progress Note Patient Details:  Amanda [name] June 18, 1946 [id number]  Patient ID: Amanda [name], female   DOB: 1946/09/27, 68 y.o.   MRN: [id number] Placed pt's rle in cpm @ 0- 45 degrees@1740   Hildred Priest 09/13/2014, 7:36 PM

## 2014-09-13 NOTE — Evaluation (Signed)
Physical Therapy Evaluation Patient Details Name: Amanda [name] MRN: [id number] DOB: 05-08-1946 Today's Date: 09/13/2014   History of Present Illness  68 yo female with new TKA RLE after OA failed conservative management with pmhx of fracture RLE  Clinical Impression  Pt was assessed with OT as she was extremely lethargic and could not safely manage for PT/OT or pt. Without the help.  Pt is getting up to bedside with max assist and 2 to help but 3 required to just stand.  Will expect SNF placement due to her slow progression.    Follow Up Recommendations SNF;Supervision/Assistance - 24 hour    Equipment Recommendations  Rolling walker with 5" wheels    Recommendations for Other Services       Precautions / Restrictions Precautions Precautions: Knee Precaution Booklet Issued: No Precaution Comments: Pt is extremely lethargic Required Braces or Orthoses: Knee Immobilizer - Right (when up to stand or walk ) Knee Immobilizer - Right: Discontinue once straight leg raise with < 10 degree lag Restrictions Weight Bearing Restrictions: Yes RLE Weight Bearing: Weight bearing as tolerated      Mobility  Bed Mobility Overal bed mobility: Needs Assistance;+2 for physical assistance;+ 2 for safety/equipment Bed Mobility: Supine to Sit;Sit to Supine     Supine to sit: +2 for physical assistance;+2 for safety/equipment Sit to supine: +2 for physical assistance;+2 for safety/equipment   General bed mobility comments: Pt cannot help with any mobility due to lethargy and still has dramatic pain complaints with mobility  Transfers Overall transfer level: Needs assistance Equipment used: 2 person hand held assist;Rolling walker (2 wheeled) (one person to assist with RLE) Transfers: Sit to/from Stand Sit to Stand: Max assist;+2 physical assistance;+2 safety/equipment         General transfer comment: cannot take a step on either leg to stabilize and cannot steady herself on walker,  but slowly begins to sit  Ambulation/Gait                Stairs            Wheelchair Mobility    Modified Rankin (Stroke Patients Only)       Balance Overall balance assessment: Needs assistance Sitting-balance support: Bilateral upper extremity supported;Single extremity supported Sitting balance-Leahy Scale: Poor Sitting balance - Comments: lists to L then to R after a time of correcting L side lean Postural control: Posterior lean Standing balance support: Bilateral upper extremity supported Standing balance-Leahy Scale: Poor Standing balance comment: Needs complete cueing for hip control, UE placement to stand and positioning of LE's to control the standing                             Pertinent Vitals/Pain Pain Assessment: Faces Faces Pain Scale: Hurts whole lot Pain Location: R knee Pain Intervention(s): Limited activity within patient's tolerance;Monitored during session;Premedicated before session;Repositioned;Ice applied;Other (comment) (Nursing in to observe and check on meds)    [facility] expects to be discharged to:: Skilled nursing facility Living Arrangements: Alone               Additional Comments: home is 2 levels but lives on one, has 2 steps no rails to enter    Prior Function Level of Independence: Independent with assistive device(s)         Comments: used rollator and rarely SPC, has elevated commode seat but no shower bench     Hand Dominance  Extremity/Trunk Assessment   Upper Extremity Assessment: Difficult to assess due to impaired cognition           Lower Extremity Assessment: Difficult to assess due to impaired cognition      Cervical / Trunk Assessment: Normal  Communication   Communication: No difficulties  Cognition Arousal/Alertness: Lethargic Behavior During Therapy: Flat affect Overall Cognitive Status: Impaired/Different from baseline Area of Impairment:  Attention;Following commands;Safety/judgement;Awareness;Problem solving;Orientation   Current Attention Level: Divided Memory: Decreased recall of precautions Following Commands: Follows one step commands inconsistently;Follows one step commands with increased time Safety/Judgement: Decreased awareness of safety Awareness: Anticipatory Problem Solving: Slow processing;Decreased initiation;Difficulty sequencing;Requires verbal cues;Requires tactile cues General Comments: So sleepy she cannot sequence and needs total cueing to sustain any activity.  Even so, she is unable to stand at bedside for more than a brief time    General Comments General comments (skin integrity, edema, etc.): Pt was observed by nursing that she cannot keep her eyes open for more than brief times and results in inconsistent control of any mobility     Exercises        Assessment/Plan    PT Assessment Patient needs continued PT services  PT Diagnosis Difficulty walking   PT Problem List Decreased strength;Decreased range of motion;Decreased activity tolerance;Decreased balance;Decreased mobility;Decreased coordination;Decreased cognition;Decreased knowledge of use of DME;Decreased safety awareness;Decreased knowledge of precautions;Decreased skin integrity;Pain  PT Treatment Interventions DME instruction;Gait training;Stair training;Functional mobility training;Therapeutic activities;Therapeutic exercise;Balance training;Neuromuscular re-education;Cognitive remediation;Patient/family education   PT Goals (Current goals can be found in the Care Plan section) Acute Rehab PT Goals Patient Stated Goal: not able to state PT Goal Formulation: With patient Time For Goal Achievement: 09/23/14 Potential to Achieve Goals: Good    Frequency 7X/week   Barriers to discharge Inaccessible home environment stairs with no rails and pt cannot fully stand yet    Co-evaluation PT/OT/SLP Co-Evaluation/Treatment: Yes Reason for  Co-Treatment: For patient/therapist safety;Necessary to address cognition/behavior during functional activity PT goals addressed during session: Mobility/safety with mobility;Proper use of DME;Other (comment) (gait sequencing)         End of Session Equipment Utilized During Treatment: Gait belt;Oxygen Activity Tolerance: Patient limited by lethargy;Patient limited by pain Patient left: in bed;with call bell/phone within reach;with nursing/sitter in room Nurse Communication: Patient requests pain meds;Other (comment) (source of lethargy discussed )         Time: 1210-1245 PT Time Calculation (min): 35 min   Charges:   PT Evaluation $Initial PT Evaluation Tier I: 1 Procedure PT Treatments $Therapeutic Activity: 8-22 mins   PT G Codes:          Ramond Dial 06-Oct-2014, 1:11 PM Mee Hives, PT MS Acute Rehab Dept. Number: [phone]

## 2014-09-13 NOTE — Progress Notes (Signed)
Subjective: Pt stable - pain ok   Objective: Vital signs in last 24 hours: Temp:  [96.8 F (36 C)-100.3 F (37.9 C)] 100.3 F (37.9 C) (10/28 0537) Pulse Rate:  [61-77] 70 (10/28 0537) Resp:  [12-28] 18 (10/28 0537) BP: (118-163)/(47-83) 143/47 mmHg (10/28 0537) SpO2:  [91 %-100 %] 95 % (10/28 0537)  Intake/Output from previous day: 10/27 0701 - 10/28 0700 In: 1606.3 [I.V.:1506.3; IV Piggyback:100] Out: 2440 [Urine:2340; Blood:100] Intake/Output this shift: Total I/O In: 156.3 [I.V.:56.3; IV Piggyback:100] Out: 1100 [Urine:1100]  Exam:  Intact pulses distally Dorsiflexion/Plantar flexion intact  Labs:  Recent Labs  09/13/14 0555  HGB 10.6*    Recent Labs  09/13/14 0555  WBC 11.5*  RBC 3.72*  HCT 32.5*  PLT 273   No results found for this basename: NA, K, CL, CO2, BUN, CREATININE, GLUCOSE, CALCIUM,  in the last 72 hours  Recent Labs  09/13/14 0555  INR 1.19    Assessment/Plan: Plan cpm and PT today   Amanda [name] SCOTT 09/13/2014, 6:51 AM

## 2014-09-13 NOTE — Progress Notes (Signed)
ANTICOAGULATION CONSULT NOTE - Follow Up Consult  Pharmacy Consult for Coumadin Indication: VTE prophylaxis  Allergies  Allergen Reactions  . Aciphex [Rabeprazole Sodium] Hives  . Erythromycin Nausea And Vomiting  . Macrodantin Nausea And Vomiting  . Nsaids     Bleeding in bladder  . Omeprazole-Sodium Bicarbonate [Omeprazole-Sodium Bicarbonate] Hives  . Sulfa Antibiotics Nausea Only and Other (See Comments)    Feel terrible  . Tape Rash   Labs:  Recent Labs  09/13/14 0555  HGB 10.6*  HCT 32.5*  PLT 273  LABPROT 15.2  INR 1.19  CREATININE 1.00    Estimated Creatinine Clearance: 62.4 ml/min (by C-G formula based on Cr of 1).  Assessment: 68 year old female continues on Coumadin for VTE prophylaxis INR trending up, CBC stable  Goal of Therapy:  INR 2-3 Monitor platelets by anticoagulation protocol: Yes   Plan:  Repeat Coumadin 7.5 mg po x 1 today Continue daily INR  Thank you. Anette Guarneri, PharmD [phone]  09/13/2014,2:04 PM

## 2014-09-14 ENCOUNTER — Encounter (HOSPITAL_COMMUNITY): Payer: Self-pay | Admitting: Orthopedic Surgery

## 2014-09-14 LAB — CBC
HCT: 30.4 % — ABNORMAL LOW (ref 36.0–46.0)
Hemoglobin: 9.9 g/dL — ABNORMAL LOW (ref 12.0–15.0)
MCH: 28.4 pg (ref 26.0–34.0)
MCHC: 32.6 g/dL (ref 30.0–36.0)
MCV: 87.4 fL (ref 78.0–100.0)
Platelets: 287 10*3/uL (ref 150–400)
RBC: 3.48 MIL/uL — ABNORMAL LOW (ref 3.87–5.11)
RDW: 13.3 % (ref 11.5–15.5)
WBC: 11.7 10*3/uL — ABNORMAL HIGH (ref 4.0–10.5)

## 2014-09-14 LAB — PROTIME-INR
INR: 2.46 — ABNORMAL HIGH (ref 0.00–1.49)
PROTHROMBIN TIME: 26.9 s — AB (ref 11.6–15.2)

## 2014-09-14 LAB — GLUCOSE, CAPILLARY
GLUCOSE-CAPILLARY: 136 mg/dL — AB (ref 70–99)
GLUCOSE-CAPILLARY: 131 mg/dL — AB (ref 70–99)
Glucose-Capillary: 143 mg/dL — ABNORMAL HIGH (ref 70–99)
Glucose-Capillary: 163 mg/dL — ABNORMAL HIGH (ref 70–99)

## 2014-09-14 MED ORDER — METHOCARBAMOL 500 MG PO TABS: 500.0000 mg | ORAL_TABLET | Freq: Four times a day (QID) | ORAL | Status: DC | PRN

## 2014-09-14 MED ORDER — ARTIFICIAL TEARS OP OINT
TOPICAL_OINTMENT | Freq: Every day | OPHTHALMIC | Status: DC
  Administered 2014-09-14: 21:00:00 via OPHTHALMIC
  Filled 2014-09-14: qty 3.5

## 2014-09-14 MED ORDER — OXYCODONE HCL 5 MG PO TABS: 5.0000 mg | ORAL_TABLET | ORAL | Status: DC | PRN

## 2014-09-14 MED ORDER — CLONAZEPAM 0.5 MG PO TABS: 0.5000 mg | ORAL_TABLET | Freq: Every day | ORAL | Status: DC

## 2014-09-14 MED ORDER — WARFARIN SODIUM 5 MG PO TABS: 5.0000 mg | ORAL_TABLET | Freq: Every day | ORAL | Status: DC

## 2014-09-14 NOTE — Progress Notes (Signed)
ANTICOAGULATION CONSULT NOTE - Follow Up Consult  Pharmacy Consult for Coumadin Indication: VTE prophylaxis  Allergies  Allergen Reactions  . Aciphex [Rabeprazole Sodium] Hives  . Erythromycin Nausea And Vomiting  . Macrodantin Nausea And Vomiting  . Nsaids     Bleeding in bladder  . Omeprazole-Sodium Bicarbonate [Omeprazole-Sodium Bicarbonate] Hives  . Sulfa Antibiotics Nausea Only and Other (See Comments)    Feel terrible  . Tape Rash   Labs:  Recent Labs  09/13/14 0555 09/14/14 0501  HGB 10.6* 9.9*  HCT 32.5* 30.4*  PLT 273 287  LABPROT 15.2 26.9*  INR 1.19 2.46*  CREATININE 1.00  --     Estimated Creatinine Clearance: 62.4 ml/min (by C-G formula based on Cr of 1).  Assessment: 68 year old female continues on Coumadin for VTE prophylaxis INR with large increase  CBC stable  Goal of Therapy:  INR 2-3 Monitor platelets by anticoagulation protocol: Yes   Plan:  Hold coumadin today Continue daily INR  Thanks for allowing pharmacy to be a part of this patient's care.  Excell Seltzer, PharmD Clinical Pharmacist, [phone] 09/14/2014,2:01 PM

## 2014-09-14 NOTE — Discharge Summary (Signed)
Physician Discharge Summary  Patient ID: Amanda [name] MRN: [id number] DOB/AGE: 21-Nov-1945 68 y.o.  Admit date: 09/12/2014 Discharge date: 09/15/2014 Admission Diagnoses:  Active Problems:   Arthritis of knee   Discharge Diagnoses:  Same  Surgeries: Procedure(s): TOTAL KNEE ARTHROPLASTY on 09/12/2014   Consultants:    Discharged Condition: Stable  Hospital Course: Amanda [name] is an 68 y.o. female who was admitted 09/12/2014 with a chief complaint of knee pain, and found to have a diagnosis of knee arthritis.  They were brought to the operating room on 09/12/2014 and underwent the above named procedures.  She was slow to mobilize and was transferred to snf post op for intensive rehab. Incision ok at time of dc  Antibiotics given:  Anti-infectives   Start     Dose/Rate Route Frequency Ordered Stop   09/12/14 1630  ceFAZolin (ANCEF) IVPB 2 g/50 mL premix     2 g 100 mL/hr over 30 Minutes Intravenous 3 times per day 09/12/14 1208 09/12/14 2224   09/12/14 0600  ceFAZolin (ANCEF) IVPB 2 g/50 mL premix     2 g 100 mL/hr over 30 Minutes Intravenous On call to O.R. 09/11/14 1413 09/12/14 0747    .  Recent vital signs:  Filed Vitals:   09/14/14 1600  BP:   Pulse:   Temp:   Resp: 16    Recent laboratory studies:  Results for orders placed during the hospital encounter of 09/12/14  GLUCOSE, CAPILLARY      Result Value Ref Range   Glucose-Capillary 133 (*) 70 - 99 mg/dL   Comment 1 Notify RN    GLUCOSE, CAPILLARY      Result Value Ref Range   Glucose-Capillary 157 (*) 70 - 99 mg/dL   Comment 1 Notify RN    PROTIME-INR      Result Value Ref Range   Prothrombin Time 15.2  11.6 - 15.2 seconds   INR 1.19  0.00 - 1.49  CBC      Result Value Ref Range   WBC 11.5 (*) 4.0 - 10.5 K/uL   RBC 3.72 (*) 3.87 - 5.11 MIL/uL   Hemoglobin 10.6 (*) 12.0 - 15.0 g/dL   HCT 32.5 (*) 36.0 - 46.0 %   MCV 87.4  78.0 - 100.0 fL   MCH 28.5  26.0 - 34.0 pg   MCHC 32.6  30.0 - 36.0  g/dL   RDW 12.8  11.5 - 15.5 %   Platelets 273  150 - 400 K/uL  BASIC METABOLIC PANEL      Result Value Ref Range   Sodium 134 (*) 137 - 147 mEq/L   Potassium 4.2  3.7 - 5.3 mEq/L   Chloride 96  96 - 112 mEq/L   CO2 28  19 - 32 mEq/L   Glucose, Bld 155 (*) 70 - 99 mg/dL   BUN 12  6 - 23 mg/dL   Creatinine, Ser 1.00  0.50 - 1.10 mg/dL   Calcium 8.5  8.4 - 10.5 mg/dL   GFR calc non Af Amer 56 (*) >90 mL/min   GFR calc Af Amer 65 (*) >90 mL/min   Anion gap 10  5 - 15  GLUCOSE, CAPILLARY      Result Value Ref Range   Glucose-Capillary 167 (*) 70 - 99 mg/dL  GLUCOSE, CAPILLARY      Result Value Ref Range   Glucose-Capillary 154 (*) 70 - 99 mg/dL  GLUCOSE, CAPILLARY      Result Value Ref Range  Glucose-Capillary 105 (*) 70 - 99 mg/dL   Comment 1 Documented in Chart     Comment 2 Notify RN    GLUCOSE, CAPILLARY      Result Value Ref Range   Glucose-Capillary 149 (*) 70 - 99 mg/dL   Comment 1 Documented in Chart     Comment 2 Notify RN    PROTIME-INR      Result Value Ref Range   Prothrombin Time 26.9 (*) 11.6 - 15.2 seconds   INR 2.46 (*) 0.00 - 1.49  CBC      Result Value Ref Range   WBC 11.7 (*) 4.0 - 10.5 K/uL   RBC 3.48 (*) 3.87 - 5.11 MIL/uL   Hemoglobin 9.9 (*) 12.0 - 15.0 g/dL   HCT 30.4 (*) 36.0 - 46.0 %   MCV 87.4  78.0 - 100.0 fL   MCH 28.4  26.0 - 34.0 pg   MCHC 32.6  30.0 - 36.0 g/dL   RDW 13.3  11.5 - 15.5 %   Platelets 287  150 - 400 K/uL  GLUCOSE, CAPILLARY      Result Value Ref Range   Glucose-Capillary 128 (*) 70 - 99 mg/dL  GLUCOSE, CAPILLARY      Result Value Ref Range   Glucose-Capillary 143 (*) 70 - 99 mg/dL   Comment 1 Notify RN    GLUCOSE, CAPILLARY      Result Value Ref Range   Glucose-Capillary 136 (*) 70 - 99 mg/dL  GLUCOSE, CAPILLARY      Result Value Ref Range   Glucose-Capillary 163 (*) 70 - 99 mg/dL    Discharge Medications:     Medication List    STOP taking these medications       HYDROcodone-acetaminophen 5-325 MG per  tablet  Commonly known as:  NORCO/VICODIN      TAKE these medications       albuterol 108 (90 BASE) MCG/ACT inhaler  Commonly known as:  PROVENTIL HFA;VENTOLIN HFA  Inhale 1 puff into the lungs every 6 (six) hours as needed for wheezing or shortness of breath.     buPROPion 300 MG 24 hr tablet  Commonly known as:  WELLBUTRIN XL  Take 300 mg by mouth daily.     chlorpheniramine 4 MG tablet  Commonly known as:  CHLOR-TRIMETON  Take 4 mg by mouth at bedtime as needed for allergies.     clonazePAM 0.5 MG tablet  Commonly known as:  KLONOPIN  Take 1 tablet (0.5 mg total) by mouth daily.     hydrochlorothiazide 12.5 MG capsule  Commonly known as:  MICROZIDE  Take 12.5 mg by mouth daily.     levothyroxine 25 MCG tablet  Commonly known as:  SYNTHROID, LEVOTHROID  Take 25 mcg by mouth daily.     losartan 25 MG tablet  Commonly known as:  COZAAR  Take 12.5 mg by mouth daily.     metFORMIN 500 MG tablet  Commonly known as:  GLUCOPHAGE  Take 500 mg by mouth 2 (two) times daily with a meal.     methocarbamol 500 MG tablet  Commonly known as:  ROBAXIN  Take 1 tablet (500 mg total) by mouth every 6 (six) hours as needed for muscle spasms.     metoprolol 50 MG tablet  Commonly known as:  LOPRESSOR  Take 25 mg by mouth 2 (two) times daily.     omeprazole 40 MG capsule  Commonly known as:  PRILOSEC  Take 40 mg by mouth daily.  OVER THE COUNTER MEDICATION  Place 1 drop into both eyes 4 (four) times daily. FreshKote Lubricant Eye Drop     oxyCODONE 5 MG immediate release tablet  Commonly known as:  Oxy IR/ROXICODONE  Take 1 tablet (5 mg total) by mouth every 4 (four) hours as needed for breakthrough pain.     PURALUBE 85-15 % Oint  Apply 1 application to eye daily.     simvastatin 10 MG tablet  Commonly known as:  ZOCOR  Take 10 mg by mouth at bedtime.     traZODone 100 MG tablet  Commonly known as:  DESYREL  Take 100-200 mg by mouth at bedtime. Most of the time takes  100 mg     venlafaxine 50 MG tablet  Commonly known as:  EFFEXOR  Take 100 mg by mouth daily.     Vitamin D3 1000 UNITS Caps  Take 1,000 Units by mouth daily.     warfarin 5 MG tablet  Commonly known as:  COUMADIN  Take 1 tablet (5 mg total) by mouth daily.        Diagnostic Studies: Dg Chest 2 View  08/31/2014   CLINICAL DATA:  Preop right knee replacement 09/12/2014.  EXAM: CHEST  2 VIEW  COMPARISON:  09/06/2004  FINDINGS: Lungs are adequately inflated without consolidation or effusion. There is borderline cardiomegaly unchanged. Degenerative changes of the spine.  IMPRESSION: No active cardiopulmonary disease.   Electronically Signed   By: Marin Olp M.D.   On: 08/31/2014 16:38   Mm Digital Screening Bilateral  08/23/2014   CLINICAL DATA:  Screening.  EXAM: DIGITAL SCREENING BILATERAL MAMMOGRAM WITH CAD  COMPARISON:  Previous exam(s).  ACR Breast Density Category b: There are scattered areas of fibroglandular density.  FINDINGS: There are no findings suspicious for malignancy. Images were processed with CAD.  IMPRESSION: No mammographic evidence of malignancy. A result letter of this screening mammogram will be mailed directly to the patient.  RECOMMENDATION: Screening mammogram in one year. (Code:[id number]-B-01Y)  BI-RADS CATEGORY  1: Negative.   Electronically Signed   By: Luberta Robertson M.D.   On: 08/23/2014 09:35    Disposition: 01-Home or Self Care      Discharge Instructions   Call MD / Call 911    Complete by:  As directed   If you experience chest pain or shortness of breath, CALL 911 and be transported to the hospital emergency room.  If you develope a fever above 101 F, pus (white drainage) or increased drainage or redness at the wound, or calf pain, call your surgeon's office.     Constipation Prevention    Complete by:  As directed   Drink plenty of fluids.  Prune juice may be helpful.  You may use a stool softener, such as Colace (over the counter) 100 mg twice a day.   Use MiraLax (over the counter) for constipation as needed.     Diet - low sodium heart healthy    Complete by:  As directed      Discharge instructions    Complete by:  As directed   Weight bearing as tolerated with walker CPM 6 hours per day Keep incision dry     Full weight bearing    Complete by:  As directed      Increase activity slowly as tolerated    Complete by:  As directed               Signed: DEAN,GREGORY SCOTT 09/14/2014, 8:37 PM

## 2014-09-14 NOTE — Progress Notes (Signed)
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  She was able to stand with the support of a RW and two people and stand to get to the recliner chair.  She is limited in her ability to walk due to bil leg weakness and bil upper extremity weakness and likely her body weight.  She continues to be appropriate for SNF level rehab at discharge.  PT will continue to follow acutely.    Follow Up Recommendations  SNF     Equipment Recommendations  Rolling walker with 5" wheels    Recommendations for Other Services   NA     Precautions / Restrictions Precautions Precautions: Knee Precaution Booklet Issued: No Precaution Comments: reviewed brace [place], no pillow under knee and WBAT status Required Braces or Orthoses: Knee Immobilizer - Right Knee Immobilizer - Right: Discontinue once straight leg raise with < 10 degree lag Restrictions RLE Weight Bearing: Weight bearing as tolerated    Mobility  Bed Mobility Overal bed mobility: Needs Assistance Bed Mobility: Supine to Sit     Supine to sit: Mod assist     General bed mobility comments: Mod assist to help pt move her right leg and support her trunk during transitions as her tendancy is to lose her balance posteriorly.   Transfers Overall transfer level: Needs assistance Equipment used: Rolling walker (2 wheeled) Transfers: Sit to/from [facility] Sit to Stand: +2 physical assistance;Mod assist Stand pivot transfers: +2 physical assistance;Mod assist       General transfer comment: Two person mod assist to stand with RW and pivot to the pt's left (stronger side).  Pt has a difficult time weight shifting to be able to move or step on her right leg.     Ambulation/Gait             General Gait Details: unable to due to weakness          Balance Overall balance assessment: Needs assistance Sitting-balance support: Feet supported Sitting balance-Leahy Scale: Poor Sitting balance - Comments: Pt requires min to mod assist to maintain sitting balance EOB.  She will lose her balance most frequently posteriorly and even if she has one or both upper extremities supproted she will lose her balance.    Standing balance support: Bilateral upper extremity supported Standing balance-Leahy Scale: Poor Standing balance comment: Pt needs two person external support to stand and pivot.                     Cognition Arousal/Alertness: Lethargic Behavior During Therapy: WFL for tasks assessed/performed Overall Cognitive Status: No family/caregiver present to determine baseline cognitive functioning                 General Comments: Compared to notes from yesterday, pt is significantly improved.  Limited by lethargy and RN has been holding pain meds to see if this helps with her arousal.     Exercises Total Joint Exercises Ankle Circles/Pumps: AAROM;AROM;Right;Left;10 reps;Supine Quad Sets: AROM;Right;10 reps;Supine Towel Squeeze: AROM;Both;10 reps;Supine Short Arc Quad: AAROM;Right;10 reps;Supine Hip ABduction/ADduction: AROM;Right;10 reps;Supine Straight Leg Raises: AAROM;Right;10 reps;Supine Knee Flexion: AAROM;Right;10 reps;Supine        Pertinent Vitals/Pain Pain Assessment: Faces Faces Pain Scale: Hurts  whole lot (with exercises) Pain Location: right knee Pain Descriptors / Indicators: Aching;Burning Pain Intervention(s): Limited activity within patient's tolerance;Monitored during session;Repositioned           PT Goals (current goals can now be found in the care plan section) Acute Rehab PT Goals Patient Stated Goal: to get better, need less help Progress towards PT goals: Progressing toward goals     Frequency  7X/week    PT Plan Current plan remains appropriate       End of Session Equipment Utilized During Treatment: Gait belt Activity Tolerance: Patient limited by fatigue;Patient limited by pain Patient left: in chair;with call bell/phone within reach     Time: 1414-1436 PT Time Calculation (min): 22 min  Charges:  $Therapeutic Activity: 8-22 mins                      Keirstan Iannello B. Issac Moure, [place], DPT [phone]   09/14/2014, 6:30 PM

## 2014-09-14 NOTE — Progress Notes (Signed)
Subjective: Pt stable - more alert today   Objective: Vital signs in last 24 hours: Temp:  [97.7 F (36.5 C)-100.2 F (37.9 C)] 97.7 F (36.5 C) (10/29 1421) Pulse Rate:  [75-94] 84 (10/29 1323) Resp:  [14-22] 16 (10/29 1600) BP: (110-128)/(52-64) 128/57 mmHg (10/29 1421) SpO2:  [89 %-98 %] 98 % (10/29 1421)  Intake/Output from previous day: 10/28 0701 - 10/29 0700 In: 1220 [P.O.:320; I.V.:900] Out: -  Intake/Output this shift: Total I/O In: 240 [P.O.:240] Out: -   Exam:  Neurovascular intact Sensation intact distally Intact pulses distally  Labs:  Recent Labs  09/13/14 0555 09/14/14 0501  HGB 10.6* 9.9*    Recent Labs  09/13/14 0555 09/14/14 0501  WBC 11.5* 11.7*  RBC 3.72* 3.48*  HCT 32.5* 30.4*  PLT 273 287    Recent Labs  09/13/14 0555  NA 134*  K 4.2  CL 96  CO2 28  BUN 12  CREATININE 1.00  GLUCOSE 155*  CALCIUM 8.5    Recent Labs  09/13/14 0555 09/14/14 0501  INR 1.19 2.46*    Assessment/Plan: Plan dc to snf tomorrow - inr ok   Nahjae Hoeg SCOTT 09/14/2014, 5:13 PM

## 2014-09-14 NOTE — Progress Notes (Signed)
Physical Therapy Treatment Patient Details Name: Amanda [name] MRN: [id number] DOB: 02/26/1946 Today's Date: 09/14/2014    History of Present Illness 68 yo female with new TKA RLE after OA failed conservative management with pmhx of fracture RLE    PT Comments    Pt is more fatigued during this afternoon session.  She required two person max assist to get from the recliner to the bed with the RW.  She continues to be appropriate for SNF level rehab at discharge.  PT will continue to follow acutely.    Follow Up Recommendations  SNF     Equipment Recommendations  Rolling walker with 5" wheels    Recommendations for Other Services   NA     Precautions / Restrictions Precautions Precautions: Knee Precaution Booklet Issued: No Precaution Comments: reviewed brace [place], no pillow under knee and WBAT status Required Braces or Orthoses: Knee Immobilizer - Right Knee Immobilizer - Right: Discontinue once straight leg raise with < 10 degree lag Restrictions RLE Weight Bearing: Weight bearing as tolerated    Mobility  Bed Mobility Overal bed mobility: Needs Assistance       Sit to supine: +2 for physical assistance;Max assist   General bed mobility comments: Two person max assist to support trunk and lift both legs into bed from sitting.   Transfers Overall transfer level: Needs assistance Equipment used: Rolling walker (2 wheeled) Transfers: Sit to/from [facility] Sit to Stand: +2 physical assistance;Max assist Stand pivot transfers: +2 safety/equipment;Max assist       General transfer comment: Two person max assist to get pt back to bed from recliner as pt has fatigued from the afternoon's activity and from being up.  Pt unable to use her arms to unweight her legs and her left knee is consistantly flexed when trying to move her right leg to take a step.  Pt needs significantly more help to support her trunk, shift her weight with max verbal cues to stand  tall and straighten her left knee.    Ambulation/Gait             General Gait Details: unable to due to weakness and pain                   Balance Overall balance assessment: Needs assistance    Standing balance support: Bilateral upper extremity supported Standing balance-Leahy Scale: Poor Standing balance comment: pt needs two person max assist during second session to stand and pivot with RW.                     Cognition Arousal/Alertness: Lethargic Behavior During Therapy: WFL for tasks assessed/performed Overall Cognitive Status: No family/caregiver present to determine baseline cognitive functioning                 General Comments: Compared to notes from yesterday, pt is significantly improved.  Limited by lethargy and RN has been holding pain meds to see if this helps with her arousal.     Exercises Total Joint Exercises Ankle Circles/Pumps: AAROM;AROM;Right;Left;10 reps;Supine Quad Sets: AROM;Right;10 reps;Supine Towel Squeeze: AROM;Both;10 reps;Supine Short Arc Quad: AAROM;Right;10 reps;Supine Hip ABduction/ADduction: AROM;Right;10 reps;Supine Straight Leg Raises: AAROM;Right;10 reps;Supine Knee Flexion: AAROM;Right;10 reps;Supine        Pertinent Vitals/Pain Pain Assessment: Faces Faces Pain Scale: Hurts whole lot (while preforming leg exercises) Pain Location: right knee Pain Descriptors / Indicators: Aching;Burning Pain Intervention(s): Limited activity within patient's tolerance;Monitored during session;Repositioned  PT Goals (current goals can now be found in the care plan section) Acute Rehab PT Goals Patient Stated Goal: to get better, need less help Progress towards PT goals: Progressing toward goals    Frequency  7X/week    PT Plan Current plan remains appropriate       End of Session Equipment Utilized During Treatment: Gait belt;Right knee immobilizer Activity Tolerance: Patient limited by  fatigue;Patient limited by pain Patient left: in bed;with call bell/phone within reach     Time: 6770-3403 PT Time Calculation (min): 26 min  Charges:  $Therapeutic Exercise: 8-22 mins $Therapeutic Activity: 8-22 mins                      Honey Zakarian B. Leola, [place], DPT [phone]   09/14/2014, 6:37 PM

## 2014-09-15 LAB — CBC
HEMATOCRIT: 28.4 % — AB (ref 36.0–46.0)
HEMOGLOBIN: 9.5 g/dL — AB (ref 12.0–15.0)
MCH: 28.9 pg (ref 26.0–34.0)
MCHC: 33.5 g/dL (ref 30.0–36.0)
MCV: 86.3 fL (ref 78.0–100.0)
Platelets: 257 10*3/uL (ref 150–400)
RBC: 3.29 MIL/uL — AB (ref 3.87–5.11)
RDW: 13.1 % (ref 11.5–15.5)
WBC: 10.9 10*3/uL — ABNORMAL HIGH (ref 4.0–10.5)

## 2014-09-15 LAB — PROTIME-INR
INR: 2.39 — ABNORMAL HIGH (ref 0.00–1.49)
PROTHROMBIN TIME: 26.3 s — AB (ref 11.6–15.2)

## 2014-09-15 LAB — GLUCOSE, CAPILLARY
GLUCOSE-CAPILLARY: 165 mg/dL — AB (ref 70–99)
Glucose-Capillary: 157 mg/dL — ABNORMAL HIGH (ref 70–99)

## 2014-09-15 MED ORDER — WARFARIN SODIUM 2.5 MG PO TABS
2.5000 mg | ORAL_TABLET | Freq: Once | ORAL | Status: DC
  Filled 2014-09-15: qty 1

## 2014-09-15 NOTE — Progress Notes (Signed)
RT Note: Pt wears CPAP of 9 cmH2O @ [facility].

## 2014-09-15 NOTE — Clinical Social Work Note (Signed)
Pt to be discharged to [facility] SNF. Pt notified regarding discharge.  [facility] Medicare SNF authorization: [id number] [place] SNF: [phone] Transportation: EMS ([address])  Lubertha Sayres, [place] ([phone]) Licensed Clinical Social Worker Orthopedics [phone]) and Surgical [phone])

## 2014-09-15 NOTE — Discharge Summary (Signed)
Pt IV removed and belongings gathered.Patient DC'd to SNF via ambulance. Report called to [facility] at [facility]. Patient left floor at 1330.

## 2014-09-15 NOTE — Progress Notes (Signed)
Physical Therapy Treatment Patient Details Name: Amanda [name] MRN: [id number] DOB: [birth year] Today's Date: 09/15/2014    History of Present Illness 68 yo female with new TKA RLE after OA failed conservative management with pmhx of fracture RLE    PT Comments    Pt. With improved mentation today during session.  Balance better and tolerated short distance ambulation.  Agree with need for SNF rehab prior to Dc home.    Follow Up Recommendations  SNF     Equipment Recommendations  Rolling walker with 5" wheels    Recommendations for Other Services       Precautions / Restrictions Precautions Precautions: Knee Precaution Comments: reinforced KI use and no pillow under knee  Required Braces or Orthoses: Knee Immobilizer - Right Knee Immobilizer - Right: Discontinue once straight leg raise with < 10 degree lag Restrictions Weight Bearing Restrictions: Yes RLE Weight Bearing: Weight bearing as tolerated    Mobility  Bed Mobility Overal bed mobility: Needs Assistance Bed Mobility: Supine to Sit     Supine to sit: Min guard     General bed mobility comments: min guaard for R LE to EOB, pt. managing upper body well  Transfers Overall transfer level: Needs assistance Equipment used: Rolling walker (2 wheeled) Transfers: Sit to/from Stand Sit to Stand: +2 safety/equipment;Min assist         General transfer comment: vc's for technique and safety; min assist to rise to stand and to control descent  Ambulation/Gait Ambulation/Gait assistance: Min assist;+2 safety/equipment Ambulation Distance (Feet): 40 Feet Assistive device: Rolling walker (2 wheeled) Gait Pattern/deviations: Step-to pattern;Decreased step length - right;Decreased stance time - right Gait velocity: decreased       Stairs            Wheelchair Mobility    Modified Rankin (Stroke Patients Only)       Balance Overall balance assessment: Needs assistance Sitting-balance support: No  upper extremity supported;Feet supported Sitting balance-Leahy Scale: Good Sitting balance - Comments: no difficulties with sitting balance at EOB this am   Standing balance support: Bilateral upper extremity supported;During functional activity Standing balance-Leahy Scale: Poor Standing balance comment: needed support from RW for stable standing                    Cognition Arousal/Alertness: Awake/alert Behavior During Therapy: WFL for tasks assessed/performed Overall Cognitive Status: Within Functional Limits for tasks assessed                      Exercises Total Joint Exercises Ankle Circles/Pumps: AROM;Both;10 reps Quad Sets: AROM;Right;10 reps;Seated Short Arc Quad: AAROM;Right;10 reps;Seated Hip ABduction/ADduction: AROM;Right;10 reps;Seated Straight Leg Raises: AAROM;Right;10 reps;Seated Knee Flexion: AAROM;Right;5 reps;Seated Goniometric ROM: near 0 to 85    General Comments        Pertinent Vitals/Pain Pain Assessment: 0-10 Pain Score: 2  Pain Location: right knee Pain Descriptors / Indicators:  (an "inkling" of pain) Pain Intervention(s): Premedicated before session;Patient requesting pain meds-RN notified;Repositioned;Monitored during session    Home Living                      Prior Function            PT Goals (current goals can now be found in the care plan section) Progress towards PT goals: Progressing toward goals    Frequency  7X/week    PT Plan Current plan remains appropriate    Co-evaluation  End of Session Equipment Utilized During Treatment: Gait belt;Right knee immobilizer Activity Tolerance: Patient tolerated treatment well Patient left: in chair;with call bell/phone within reach     Time: 1028-1056 PT Time Calculation (min): 28 min  Charges:  $Gait Training: 8-22 mins $Therapeutic Exercise: 8-22 mins                    G Codes:      Ladona Ridgel 09/15/2014, 11:21 AM   Gerlean Ren PT Acute Rehab Services [phone] [place] [phone]

## 2014-09-15 NOTE — Progress Notes (Signed)
Pt doing well Dressing changed Dc to snf today

## 2014-09-19 ENCOUNTER — Encounter: Payer: Self-pay | Admitting: Internal Medicine

## 2014-09-19 ENCOUNTER — Non-Acute Institutional Stay (SKILLED_NURSING_FACILITY): Payer: Medicare Other | Admitting: Internal Medicine

## 2014-09-19 DIAGNOSIS — F32A Depression, unspecified: Secondary | ICD-10-CM

## 2014-09-19 DIAGNOSIS — F329 Major depressive disorder, single episode, unspecified: Secondary | ICD-10-CM

## 2014-09-19 DIAGNOSIS — E785 Hyperlipidemia, unspecified: Secondary | ICD-10-CM

## 2014-09-19 DIAGNOSIS — I10 Essential (primary) hypertension: Secondary | ICD-10-CM

## 2014-09-19 DIAGNOSIS — Z96651 Presence of right artificial knee joint: Secondary | ICD-10-CM

## 2014-09-19 DIAGNOSIS — K219 Gastro-esophageal reflux disease without esophagitis: Secondary | ICD-10-CM

## 2014-09-19 DIAGNOSIS — E119 Type 2 diabetes mellitus without complications: Secondary | ICD-10-CM

## 2014-09-19 NOTE — Progress Notes (Addendum)
MRN: [id number] Name: Amanda [name]  Sex: female Age: 67 y.o. DOB: September 19, 1946  [place] #: Karren Burly Facility/Room: 218 Level Of Care: SNF Provider: Inocencio Homes D Emergency Contacts: Extended Emergency Contact Information Primary Emergency Contact: [place], [place] [address] [name] of [place] Phone: [phone] Relation: Friend Secondary Emergency Contact: [name]  [place] of [place] Phone: [phone] Relation: Friend  Code Status: FULL  Allergies: Aciphex; Erythromycin; Macrodantin; Nsaids; Omeprazole-sodium bicarbonate; Sulfa antibiotics; and Tape  Chief Complaint  Patient presents with  . New Admit To SNF    HPI: Patient is 69 y.o. female who is admitted to SNF s/p R knee arthroplasty.  Past Medical History  Diagnosis Date  . Sleep apnea     cpap  . Complication of anesthesia     "forgets to breathe" when she wakes up  . Vertigo   . Heart murmur   . Mitral valve prolapse   . Asthma     seasonal or with bronchitis  . Pneumonia     hx of  . Hypothyroidism   . Anxiety   . Headache   . Arthritis   . Hepatitis     A -- as teenager  . Hypertension   . GERD (gastroesophageal reflux disease)   . Diabetes type 2, controlled     fasting 100-120  . Depression   . Hyperlipidemia     Past Surgical History  Procedure Laterality Date  . Gallbladder surgery  1995  . Nasal sinus surgery  2005  . Throat surgery  2005  . Partial hysterectomy  1983  . Cholecystectomy    . Abdominal hysterectomy    . Joint replacement Left     knee  . Fracture surgery Right     leg  . Total knee arthroplasty Right 09/12/2014    Procedure: TOTAL KNEE ARTHROPLASTY;  Surgeon: Meredith Pel, MD;  Location: [facility];  Service: Orthopedics;  Laterality: Right;      Medication List       This list is accurate as of: 09/19/14 11:59 PM.  Always use your most recent med list.               albuterol 108 (90 BASE) MCG/ACT inhaler   Commonly known as:  PROVENTIL HFA;VENTOLIN HFA  Inhale 1 puff into the lungs every 6 (six) hours as needed for wheezing or shortness of breath.     buPROPion 300 MG 24 hr tablet  Commonly known as:  WELLBUTRIN XL  Take 300 mg by mouth daily.     chlorpheniramine 4 MG tablet  Commonly known as:  CHLOR-TRIMETON  Take 4 mg by mouth at bedtime as needed for allergies.     clonazePAM 0.5 MG tablet  Commonly known as:  KLONOPIN  Take 1 tablet (0.5 mg total) by mouth daily.     hydrochlorothiazide 12.5 MG capsule  Commonly known as:  MICROZIDE  Take 12.5 mg by mouth daily.     levothyroxine 25 MCG tablet  Commonly known as:  SYNTHROID, LEVOTHROID  Take 25 mcg by mouth daily.     losartan 25 MG tablet  Commonly known as:  COZAAR  Take 12.5 mg by mouth daily.     metFORMIN 500 MG tablet  Commonly known as:  GLUCOPHAGE  Take 500 mg by mouth 2 (two) times daily with a meal.     methocarbamol 500 MG tablet  Commonly known as:  ROBAXIN  Take 1 tablet (500 mg total) by mouth every 6 (six) hours as needed for muscle spasms.     metoprolol 50 MG tablet  Commonly known as:  LOPRESSOR  Take 25 mg by mouth 2 (two) times daily.     omeprazole 40 MG capsule  Commonly known as:  PRILOSEC  Take 40 mg by mouth daily.     OVER THE COUNTER MEDICATION  Place 1 drop into both eyes 4 (four) times daily. FreshKote Lubricant Eye Drop     oxyCODONE 5 MG immediate release tablet  Commonly known as:  Oxy IR/ROXICODONE  Take 1 tablet (5 mg total) by mouth every 4 (four) hours as needed for breakthrough pain.     PURALUBE 85-15 % Oint  Apply 1 application to eye daily.     simvastatin 10 MG tablet  Commonly known as:  ZOCOR  Take 10 mg by mouth at bedtime.     traZODone 100 MG tablet  Commonly known as:  DESYREL  Take 100-200 mg by mouth at bedtime. Most of the time takes 100 mg     venlafaxine 50 MG tablet  Commonly known as:  EFFEXOR  Take 100 mg by mouth daily.     Vitamin D3 1000  UNITS Caps  Take 1,000 Units by mouth daily.     warfarin 5 MG tablet  Commonly known as:  COUMADIN  Take 1 tablet (5 mg total) by mouth daily.        No orders of the defined types were placed in this encounter.     There is no immunization history on file for this patient.  History  Substance Use Topics  . Smoking status: Never Smoker   . Smokeless tobacco: Never Used  . Alcohol Use: No     Comment: socially    Family history is noncontributory    Review of Systems  DATA OBTAINED: from patient GENERAL:  no fevers, fatigue, appetite changes SKIN: No itching, rash or wounds EYES: No eye pain, redness, discharge EARS: No earache, tinnitus, change in hearing NOSE: No congestion, drainage or bleeding  MOUTH/THROAT: No mouth or tooth pain, No sore throat RESPIRATORY: No cough, wheezing, SOB CARDIAC: No chest pain, palpitations, lower extremity edema  GI: No abdominal pain, No N/V/D or constipation, No heartburn or reflux  GU: No dysuria, frequency or urgency, or incontinence  MUSCULOSKELETAL: No unrelieved bone/joint pain NEUROLOGIC: No headache, dizziness or focal weakness PSYCHIATRIC: No overt anxiety or sadness, No behavior issue.   Filed Vitals:   09/24/14 2039  BP: 149/76  Pulse: 65  Temp: 98 F (36.7 C)  Resp: 18    Physical Exam  GENERAL APPEARANCE: Alert, conversant,  No acute distress.  SKIN: No diaphoresis rash HEAD: Normocephalic, atraumatic  EYES: Conjunctiva/lids clear. Pupils round, reactive. EOMs intact.  EARS: External exam WNL, canals clear. Hearing grossly normal.  NOSE: No deformity or discharge.  MOUTH/THROAT: Lips w/o lesions  RESPIRATORY: Breathing is even, unlabored. Lung sounds are clear   CARDIOVASCULAR: Heart RRR no murmurs, rubs or gallops. No peripheral edema.   GASTROINTESTINAL: Abdomen is soft, non-tender, not distended w/ normal bowel sounds. GENITOURINARY: Bladder non tender, not distended  MUSCULOSKELETAL: No abnormal  joints or musculature NEUROLOGIC:  Cranial nerves 2-12 grossly intact. Moves all extremities  PSYCHIATRIC: Mood and affect appropriate to situation, no behavioral issues  Patient Active Problem List   Diagnosis Date Noted  . S/P total knee arthroplasty 09/24/2014  . Hypertension   . GERD (gastroesophageal reflux disease)   .  Diabetes type 2, controlled   . Depression   . Hyperlipidemia   . 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 Cough 08/31/2013    CBC    Component Value Date/Time   WBC 10.9* 09/15/2014 0545   RBC 3.29* 09/15/2014 0545   HGB 9.5* 09/15/2014 0545   HCT 28.4* 09/15/2014 0545   PLT 257 09/15/2014 0545   MCV 86.3 09/15/2014 0545   LYMPHSABS 2.1 09/23/2011 2345   MONOABS 0.8 09/23/2011 2345   EOSABS 0.3 09/23/2011 2345   BASOSABS 0.1 09/23/2011 2345    CMP     Component Value Date/Time   NA 134* 09/13/2014 0555   K 4.2 09/13/2014 0555   CL 96 09/13/2014 0555   CO2 28 09/13/2014 0555   GLUCOSE 155* 09/13/2014 0555   BUN 12 09/13/2014 0555   CREATININE 1.00 09/13/2014 0555   CALCIUM 8.5 09/13/2014 0555   PROT 6.4 09/23/2011 2345   ALBUMIN 3.4* 09/23/2011 2345   AST 13 09/23/2011 2345   ALT 10 09/23/2011 2345   ALKPHOS 67 09/23/2011 2345   BILITOT 0.3 09/23/2011 2345   GFRNONAA 56* 09/13/2014 0555   GFRAA 65* 09/13/2014 0555    Assessment and Plan  S/P total knee arthroplasty Admitted for OT/Pt; oxycodone, robaxin and warfarin as prophylaxis  Hypertension A little elevated today, will monitor and continue HCTZ, cozaar and lopressor  GERD (gastroesophageal reflux disease) Continue prilosec 40 mg  Diabetes type 2, controlled Controlled;continue metformin;pt is on ARB and statin  Depression Pt is on wellbutrin and effexor per psych and trazodone for sleep.  Hyperlipidemia Continie zocor 10 mg    Hennie Duos, MD

## 2014-09-24 ENCOUNTER — Encounter: Payer: Self-pay | Admitting: Internal Medicine

## 2014-09-24 DIAGNOSIS — F329 Major depressive disorder, single episode, unspecified: Secondary | ICD-10-CM | POA: Insufficient documentation

## 2014-09-24 DIAGNOSIS — E119 Type 2 diabetes mellitus without complications: Secondary | ICD-10-CM | POA: Insufficient documentation

## 2014-09-24 DIAGNOSIS — E785 Hyperlipidemia, unspecified: Secondary | ICD-10-CM | POA: Insufficient documentation

## 2014-09-24 DIAGNOSIS — Z96659 Presence of unspecified artificial knee joint: Secondary | ICD-10-CM | POA: Insufficient documentation

## 2014-09-24 DIAGNOSIS — I10 Essential (primary) hypertension: Secondary | ICD-10-CM | POA: Insufficient documentation

## 2014-09-24 DIAGNOSIS — F32A Depression, unspecified: Secondary | ICD-10-CM | POA: Insufficient documentation

## 2014-09-24 DIAGNOSIS — K219 Gastro-esophageal reflux disease without esophagitis: Secondary | ICD-10-CM | POA: Insufficient documentation

## 2014-09-24 NOTE — Assessment & Plan Note (Signed)
Controlled;continue metformin;pt is on ARB and statin

## 2014-09-24 NOTE — Assessment & Plan Note (Signed)
Continue prilosec 40 mg

## 2014-09-24 NOTE — Assessment & Plan Note (Signed)
Admitted for OT/Pt; oxycodone, robaxin and warfarin as prophylaxis

## 2014-09-24 NOTE — Assessment & Plan Note (Signed)
Continie zocor 10 mg

## 2014-09-24 NOTE — Assessment & Plan Note (Signed)
A little elevated today, will monitor and continue HCTZ, cozaar and lopressor

## 2014-09-24 NOTE — Assessment & Plan Note (Signed)
Pt is on wellbutrin and effexor per psych and trazodone for sleep.

## 2014-09-26 ENCOUNTER — Encounter: Payer: Self-pay | Admitting: Internal Medicine

## 2014-09-26 ENCOUNTER — Non-Acute Institutional Stay (SKILLED_NURSING_FACILITY): Payer: Medicare Other | Admitting: Internal Medicine

## 2014-09-26 DIAGNOSIS — E785 Hyperlipidemia, unspecified: Secondary | ICD-10-CM

## 2014-09-26 DIAGNOSIS — Z96651 Presence of right artificial knee joint: Secondary | ICD-10-CM

## 2014-09-26 DIAGNOSIS — K219 Gastro-esophageal reflux disease without esophagitis: Secondary | ICD-10-CM

## 2014-09-26 DIAGNOSIS — F32A Depression, unspecified: Secondary | ICD-10-CM

## 2014-09-26 DIAGNOSIS — E119 Type 2 diabetes mellitus without complications: Secondary | ICD-10-CM

## 2014-09-26 DIAGNOSIS — F329 Major depressive disorder, single episode, unspecified: Secondary | ICD-10-CM

## 2014-09-26 DIAGNOSIS — I10 Essential (primary) hypertension: Secondary | ICD-10-CM

## 2014-09-26 NOTE — Progress Notes (Signed)
MRN: [id number] Name: Amanda [name]  Sex: female Age: 68 y.o. DOB: 08/19/1946  [place] #: Karren Burly Facility/Room:  Level Of Care: SNF Provider: Inocencio Homes D Emergency Contacts: Extended Emergency Contact Information Primary Emergency Contact: [place]          [place], [place] [address] [name] of [place] Phone: [phone] Relation: Friend Secondary Emergency Contact: [name]  [place] of [place] Phone: [phone] Relation: Friend  Code Status:FULL   Allergies: Aciphex; Erythromycin; Macrodantin; Nsaids; Omeprazole-sodium bicarbonate; Sulfa antibiotics; and Tape  Chief Complaint  Patient presents with  . Discharge Note    HPI: Patient is 68 y.o. female who was admitted to SNF s/p R hip arthroplasty who is now ready to be discharged to home.  Past Medical History  Diagnosis Date  . Sleep apnea     cpap  . Complication of anesthesia     "forgets to breathe" when she wakes up  . Vertigo   . Heart murmur   . Mitral valve prolapse   . Asthma     seasonal or with bronchitis  . Pneumonia     hx of  . Hypothyroidism   . Anxiety   . Headache   . Arthritis   . Hepatitis     A -- as teenager  . Hypertension   . GERD (gastroesophageal reflux disease)   . Diabetes type 2, controlled     fasting 100-120  . Depression   . Hyperlipidemia     Past Surgical History  Procedure Laterality Date  . Gallbladder surgery  1995  . Nasal sinus surgery  2005  . Throat surgery  2005  . Partial hysterectomy  1983  . Cholecystectomy    . Abdominal hysterectomy    . Joint replacement Left     knee  . Fracture surgery Right     leg  . Total knee arthroplasty Right 09/12/2014    Procedure: TOTAL KNEE ARTHROPLASTY;  Surgeon: Meredith Pel, MD;  Location: [facility];  Service: Orthopedics;  Laterality: Right;      Medication List       This list is accurate as of: 09/26/14  7:49 PM.  Always use your most recent med list.               albuterol 108 (90 BASE) MCG/ACT inhaler  Commonly known as:  PROVENTIL HFA;VENTOLIN HFA  Inhale 1 puff into the lungs every 6 (six) hours as needed for wheezing or shortness of breath.     buPROPion 300 MG 24 hr tablet  Commonly known as:  WELLBUTRIN XL  Take 300 mg by mouth daily.     chlorpheniramine 4 MG tablet  Commonly known as:  CHLOR-TRIMETON  Take 4 mg by mouth at bedtime as needed for allergies.     clonazePAM 0.5 MG tablet  Commonly known as:  KLONOPIN  Take 1 tablet (0.5 mg total) by mouth daily.     hydrochlorothiazide 12.5 MG capsule  Commonly known as:  MICROZIDE  Take 12.5 mg by mouth daily.     levothyroxine 25 MCG tablet  Commonly known as:  SYNTHROID, LEVOTHROID  Take 25 mcg by mouth daily.     losartan 25 MG tablet  Commonly known as:  COZAAR  Take 12.5 mg by mouth daily.     metFORMIN 500 MG tablet  Commonly known as:  GLUCOPHAGE  Take 500 mg by mouth 2 (two) times daily with a meal.     methocarbamol 500 MG tablet  Commonly known as:  ROBAXIN  Take 1 tablet (500 mg total) by mouth every 6 (six) hours as needed for muscle spasms.     metoprolol 50 MG tablet  Commonly known as:  LOPRESSOR  Take 25 mg by mouth 2 (two) times daily.     omeprazole 40 MG capsule  Commonly known as:  PRILOSEC  Take 40 mg by mouth daily.     OVER THE COUNTER MEDICATION  Place 1 drop into both eyes 4 (four) times daily. FreshKote Lubricant Eye Drop     oxyCODONE 5 MG immediate release tablet  Commonly known as:  Oxy IR/ROXICODONE  Take 1 tablet (5 mg total) by mouth every 4 (four) hours as needed for breakthrough pain.     PURALUBE 85-15 % Oint  Apply 1 application to eye daily.     simvastatin 10 MG tablet  Commonly known as:  ZOCOR  Take 10 mg by mouth at bedtime.     SYSTANE BALANCE 0.6 % Soln  Generic drug:  Propylene Glycol  Apply 1 drop to eye every 6 (six) hours.     traZODone 100 MG tablet  Commonly known as:  DESYREL  Take 100-200 mg by mouth at  bedtime. Most of the time takes 100 mg     venlafaxine 50 MG tablet  Commonly known as:  EFFEXOR  Take 100 mg by mouth daily.     Vitamin D3 1000 UNITS Caps  Take 1,000 Units by mouth daily.     warfarin 5 MG tablet  Commonly known as:  COUMADIN  Take 1 tablet (5 mg total) by mouth daily.        Meds ordered this encounter  Medications  . Propylene Glycol (SYSTANE BALANCE) 0.6 % SOLN    Sig: Apply 1 drop to eye every 6 (six) hours.     There is no immunization history on file for this patient.  History  Substance Use Topics  . Smoking status: Never Smoker   . Smokeless tobacco: Never Used  . Alcohol Use: No     Comment: socially    Filed Vitals:   09/26/14 1451  BP: 130/68  Pulse: 74  Temp: 97.7 F (36.5 C)  Resp: 16    Physical Exam  GENERAL APPEARANCE: Alert, conversant. No acute distress.  HEENT: Unremarkable. RESPIRATORY: Breathing is even, unlabored. Lung sounds are clear   CARDIOVASCULAR: Heart RRR no murmurs, rubs or gallops. No peripheral edema.  GASTROINTESTINAL: Abdomen is soft, non-tender, not distended w/ normal bowel sounds.  NEUROLOGIC: Cranial nerves 2-12 grossly intact. Moves all extremities  Patient Active Problem List   Diagnosis Date Noted  . S/P total knee arthroplasty 09/24/2014  . Hypertension   . GERD (gastroesophageal reflux disease)   . Diabetes type 2, controlled   . Depression   . Hyperlipidemia   . 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 Cough 08/31/2013    CBC    Component Value Date/Time   WBC 10.9* 09/15/2014 0545   RBC 3.29* 09/15/2014 0545   HGB 9.5* 09/15/2014 0545   HCT 28.4* 09/15/2014 0545   PLT 257 09/15/2014 0545   MCV 86.3 09/15/2014 0545   LYMPHSABS 2.1 09/23/2011 2345   MONOABS 0.8 09/23/2011 2345   EOSABS 0.3 09/23/2011 2345   BASOSABS 0.1 09/23/2011 2345    CMP     Component Value Date/Time   NA 134* 09/13/2014 0555   K 4.2 09/13/2014 0555   CL 96 09/13/2014 0555   CO2 28 09/13/2014 0555  GLUCOSE  155* 09/13/2014 0555   BUN 12 09/13/2014 0555   CREATININE 1.00 09/13/2014 0555   CALCIUM 8.5 09/13/2014 0555   PROT 6.4 09/23/2011 2345   ALBUMIN 3.4* 09/23/2011 2345   AST 13 09/23/2011 2345   ALT 10 09/23/2011 2345   ALKPHOS 67 09/23/2011 2345   BILITOT 0.3 09/23/2011 2345   GFRNONAA 56* 09/13/2014 0555   GFRAA 65* 09/13/2014 0555    Assessment and Plan  Pt is improved and stable for d/c to home with OT/PT/HH.  Hennie Duos, MD

## 2014-10-11 ENCOUNTER — Inpatient Hospital Stay (HOSPITAL_COMMUNITY)
Admission: EM | Admit: 2014-10-11 | Discharge: 2014-10-13 | DRG: 555 | Disposition: A | Discharge status: Home / Self Care | Payer: Medicare Other | Attending: Internal Medicine | Admitting: Internal Medicine

## 2014-10-11 ENCOUNTER — Emergency Department (HOSPITAL_COMMUNITY): Payer: Medicare Other

## 2014-10-11 ENCOUNTER — Encounter (HOSPITAL_COMMUNITY): Payer: Self-pay | Admitting: *Deleted

## 2014-10-11 DIAGNOSIS — K759 Inflammatory liver disease, unspecified: Secondary | ICD-10-CM | POA: Diagnosis present

## 2014-10-11 DIAGNOSIS — D689 Coagulation defect, unspecified: Secondary | ICD-10-CM | POA: Diagnosis present

## 2014-10-11 DIAGNOSIS — Z882 Allergy status to sulfonamides status: Secondary | ICD-10-CM | POA: Diagnosis not present

## 2014-10-11 DIAGNOSIS — Z96651 Presence of right artificial knee joint: Secondary | ICD-10-CM | POA: Diagnosis present

## 2014-10-11 DIAGNOSIS — I341 Nonrheumatic mitral (valve) prolapse: Secondary | ICD-10-CM | POA: Diagnosis present

## 2014-10-11 DIAGNOSIS — I10 Essential (primary) hypertension: Secondary | ICD-10-CM | POA: Diagnosis present

## 2014-10-11 DIAGNOSIS — E039 Hypothyroidism, unspecified: Secondary | ICD-10-CM | POA: Diagnosis present

## 2014-10-11 DIAGNOSIS — M1990 Unspecified osteoarthritis, unspecified site: Secondary | ICD-10-CM | POA: Diagnosis present

## 2014-10-11 DIAGNOSIS — G4733 Obstructive sleep apnea (adult) (pediatric): Secondary | ICD-10-CM | POA: Diagnosis present

## 2014-10-11 DIAGNOSIS — R9389 Abnormal findings on diagnostic imaging of other specified body structures: Secondary | ICD-10-CM | POA: Insufficient documentation

## 2014-10-11 DIAGNOSIS — M7981 Nontraumatic hematoma of soft tissue: Secondary | ICD-10-CM | POA: Diagnosis present

## 2014-10-11 DIAGNOSIS — J45909 Unspecified asthma, uncomplicated: Secondary | ICD-10-CM | POA: Diagnosis present

## 2014-10-11 DIAGNOSIS — Z881 Allergy status to other antibiotic agents status: Secondary | ICD-10-CM | POA: Diagnosis not present

## 2014-10-11 DIAGNOSIS — E785 Hyperlipidemia, unspecified: Secondary | ICD-10-CM | POA: Diagnosis present

## 2014-10-11 DIAGNOSIS — Z90710 Acquired absence of both cervix and uterus: Secondary | ICD-10-CM

## 2014-10-11 DIAGNOSIS — M25552 Pain in left hip: Secondary | ICD-10-CM | POA: Diagnosis present

## 2014-10-11 DIAGNOSIS — F419 Anxiety disorder, unspecified: Secondary | ICD-10-CM | POA: Diagnosis present

## 2014-10-11 DIAGNOSIS — J189 Pneumonia, unspecified organism: Secondary | ICD-10-CM | POA: Diagnosis present

## 2014-10-11 DIAGNOSIS — D649 Anemia, unspecified: Secondary | ICD-10-CM | POA: Diagnosis present

## 2014-10-11 DIAGNOSIS — S301XXA Contusion of abdominal wall, initial encounter: Secondary | ICD-10-CM | POA: Diagnosis present

## 2014-10-11 DIAGNOSIS — T45515A Adverse effect of anticoagulants, initial encounter: Secondary | ICD-10-CM | POA: Diagnosis present

## 2014-10-11 DIAGNOSIS — K219 Gastro-esophageal reflux disease without esophagitis: Secondary | ICD-10-CM | POA: Diagnosis present

## 2014-10-11 DIAGNOSIS — F329 Major depressive disorder, single episode, unspecified: Secondary | ICD-10-CM | POA: Diagnosis present

## 2014-10-11 DIAGNOSIS — Z96659 Presence of unspecified artificial knee joint: Secondary | ICD-10-CM

## 2014-10-11 DIAGNOSIS — Z66 Do not resuscitate: Secondary | ICD-10-CM | POA: Diagnosis present

## 2014-10-11 DIAGNOSIS — E119 Type 2 diabetes mellitus without complications: Secondary | ICD-10-CM | POA: Diagnosis present

## 2014-10-11 DIAGNOSIS — E871 Hypo-osmolality and hyponatremia: Secondary | ICD-10-CM | POA: Diagnosis present

## 2014-10-11 DIAGNOSIS — Y9289 Other specified places as the place of occurrence of the external cause: Secondary | ICD-10-CM | POA: Diagnosis not present

## 2014-10-11 HISTORY — DX: Obstructive sleep apnea (adult) (pediatric): G47.33

## 2014-10-11 HISTORY — DX: Dependence on other enabling machines and devices: Z99.89

## 2014-10-11 LAB — PROTIME-INR
INR: 8.26 — AB (ref 0.00–1.49)
INR: 1.79 — ABNORMAL HIGH (ref 0.00–1.49)
PROTHROMBIN TIME: 69.2 s — AB (ref 11.6–15.2)
PROTHROMBIN TIME: 21 s — AB (ref 11.6–15.2)

## 2014-10-11 LAB — CBC WITH DIFFERENTIAL/PLATELET
Basophils Absolute: 0 10*3/uL (ref 0.0–0.1)
Basophils Relative: 0 % (ref 0–1)
EOS ABS: 0.1 10*3/uL (ref 0.0–0.7)
Eosinophils Relative: 1 % (ref 0–5)
HEMATOCRIT: 29.8 % — AB (ref 36.0–46.0)
Hemoglobin: 9.8 g/dL — ABNORMAL LOW (ref 12.0–15.0)
Lymphocytes Relative: 17 % (ref 12–46)
Lymphs Abs: 1.9 10*3/uL (ref 0.7–4.0)
MCH: 28.1 pg (ref 26.0–34.0)
MCHC: 32.9 g/dL (ref 30.0–36.0)
MCV: 85.4 fL (ref 78.0–100.0)
MONOS PCT: 9 % (ref 3–12)
Monocytes Absolute: 1.1 10*3/uL — ABNORMAL HIGH (ref 0.1–1.0)
Neutro Abs: 8.4 10*3/uL — ABNORMAL HIGH (ref 1.7–7.7)
Neutrophils Relative %: 73 % (ref 43–77)
Platelets: 354 10*3/uL (ref 150–400)
RBC: 3.49 MIL/uL — ABNORMAL LOW (ref 3.87–5.11)
RDW: 13.5 % (ref 11.5–15.5)
WBC: 11.5 10*3/uL — ABNORMAL HIGH (ref 4.0–10.5)

## 2014-10-11 LAB — TYPE AND SCREEN
ABO/RH(D): A POS
Antibody Screen: NEGATIVE

## 2014-10-11 LAB — COMPREHENSIVE METABOLIC PANEL
ALT: 10 U/L (ref 0–35)
ANION GAP: 13 (ref 5–15)
AST: 28 U/L (ref 0–37)
Albumin: 3.1 g/dL — ABNORMAL LOW (ref 3.5–5.2)
Alkaline Phosphatase: 86 U/L (ref 39–117)
BILIRUBIN TOTAL: 0.6 mg/dL (ref 0.3–1.2)
BUN: 14 mg/dL (ref 6–23)
CALCIUM: 9.4 mg/dL (ref 8.4–10.5)
CO2: 25 mEq/L (ref 19–32)
Chloride: 92 mEq/L — ABNORMAL LOW (ref 96–112)
Creatinine, Ser: 0.75 mg/dL (ref 0.50–1.10)
GFR calc Af Amer: >90 mL/min (ref >90)
GFR calc non Af Amer: 84 mL/min — ABNORMAL LOW (ref >90)
Glucose, Bld: 106 mg/dL — ABNORMAL HIGH (ref 70–99)
Potassium: 3.8 mEq/L (ref 3.7–5.3)
Sodium: 130 mEq/L — ABNORMAL LOW (ref 137–147)
Total Protein: 6.4 g/dL (ref 6.0–8.3)

## 2014-10-11 LAB — APTT: aPTT: 170 seconds — ABNORMAL HIGH (ref 24–37)

## 2014-10-11 MED ORDER — TRAZODONE HCL 100 MG PO TABS
100.0000 mg | ORAL_TABLET | Freq: Every day | ORAL | Status: DC
  Administered 2014-10-11 – 2014-10-12 (×2): 200 mg via ORAL
  Filled 2014-10-11 (×3): qty 2

## 2014-10-11 MED ORDER — LEVOTHYROXINE SODIUM 25 MCG PO TABS
25.0000 ug | ORAL_TABLET | Freq: Every day | ORAL | Status: DC
Start: 2014-10-12 — End: 2014-10-13
  Administered 2014-10-12 – 2014-10-13 (×2): 25 ug via ORAL
  Filled 2014-10-11 (×3): qty 1

## 2014-10-11 MED ORDER — ALUM & MAG HYDROXIDE-SIMETH 200-200-20 MG/5ML PO SUSP: 30.0000 mL | Freq: Four times a day (QID) | ORAL | Status: DC | PRN

## 2014-10-11 MED ORDER — DOCUSATE SODIUM 100 MG PO CAPS
100.0000 mg | ORAL_CAPSULE | Freq: Two times a day (BID) | ORAL | Status: DC
  Administered 2014-10-11 – 2014-10-13 (×4): 100 mg via ORAL
  Filled 2014-10-11 (×6): qty 1

## 2014-10-11 MED ORDER — ALBUTEROL SULFATE (2.5 MG/3ML) 0.083% IN NEBU: 3.0000 mL | INHALATION_SOLUTION | Freq: Four times a day (QID) | RESPIRATORY_TRACT | Status: DC | PRN

## 2014-10-11 MED ORDER — ONDANSETRON HCL 4 MG PO TABS: 4.0000 mg | ORAL_TABLET | Freq: Three times a day (TID) | ORAL | Status: DC | PRN

## 2014-10-11 MED ORDER — SODIUM CHLORIDE 0.9 % IV SOLN
Freq: Once | INTRAVENOUS | Status: DC
Start: 2014-10-11 — End: 2014-10-12

## 2014-10-11 MED ORDER — DIAZEPAM 5 MG PO TABS
5.0000 mg | ORAL_TABLET | Freq: Once | ORAL | Status: AC
  Administered 2014-10-11: 5 mg via ORAL
  Filled 2014-10-11: qty 1

## 2014-10-11 MED ORDER — SODIUM CHLORIDE 0.9 % IJ SOLN
3.0000 mL | Freq: Two times a day (BID) | INTRAMUSCULAR | Status: DC
  Administered 2014-10-11: 3 mL via INTRAVENOUS

## 2014-10-11 MED ORDER — PANTOPRAZOLE SODIUM 40 MG PO TBEC
40.0000 mg | DELAYED_RELEASE_TABLET | Freq: Every day | ORAL | Status: DC
Start: 2014-10-12 — End: 2014-10-13
  Administered 2014-10-12 – 2014-10-13 (×2): 40 mg via ORAL
  Filled 2014-10-11 (×2): qty 1

## 2014-10-11 MED ORDER — ACETAMINOPHEN 325 MG PO TABS: 650.0000 mg | ORAL_TABLET | Freq: Four times a day (QID) | ORAL | Status: DC | PRN

## 2014-10-11 MED ORDER — POLYETHYLENE GLYCOL 3350 17 G PO PACK
17.0000 g | PACK | Freq: Every day | ORAL | Status: DC | PRN
  Filled 2014-10-11: qty 1

## 2014-10-11 MED ORDER — PROPYLENE GLYCOL 0.6 % OP SOLN: 1.0000 [drp] | Freq: Four times a day (QID) | OPHTHALMIC | Status: DC | PRN

## 2014-10-11 MED ORDER — VENLAFAXINE HCL 50 MG PO TABS
100.0000 mg | ORAL_TABLET | Freq: Every day | ORAL | Status: DC
Start: 2014-10-12 — End: 2014-10-13
  Administered 2014-10-12 – 2014-10-13 (×2): 100 mg via ORAL
  Filled 2014-10-11 (×2): qty 2

## 2014-10-11 MED ORDER — HYDROCODONE-ACETAMINOPHEN 5-325 MG PO TABS: 1.0000 | ORAL_TABLET | Freq: Four times a day (QID) | ORAL | Status: DC | PRN

## 2014-10-11 MED ORDER — BUPROPION HCL ER (XL) 300 MG PO TB24
300.0000 mg | ORAL_TABLET | Freq: Every day | ORAL | Status: DC
  Administered 2014-10-12 – 2014-10-13 (×2): 300 mg via ORAL
  Filled 2014-10-11 (×2): qty 1

## 2014-10-11 MED ORDER — ONDANSETRON HCL 4 MG PO TABS: 4.0000 mg | ORAL_TABLET | Freq: Four times a day (QID) | ORAL | Status: DC | PRN

## 2014-10-11 MED ORDER — HYDROMORPHONE HCL 1 MG/ML IJ SOLN
1.0000 mg | Freq: Once | INTRAMUSCULAR | Status: AC
  Administered 2014-10-11: 1 mg via INTRAMUSCULAR
  Filled 2014-10-11: qty 1

## 2014-10-11 MED ORDER — CLONAZEPAM 0.5 MG PO TABS
0.5000 mg | ORAL_TABLET | Freq: Every day | ORAL | Status: DC
  Administered 2014-10-12 – 2014-10-13 (×2): 0.5 mg via ORAL
  Filled 2014-10-11 (×2): qty 1

## 2014-10-11 MED ORDER — ACETAMINOPHEN 650 MG RE SUPP: 650.0000 mg | Freq: Four times a day (QID) | RECTAL | Status: DC | PRN

## 2014-10-11 MED ORDER — SODIUM CHLORIDE 0.9 % IJ SOLN: 3.0000 mL | INTRAMUSCULAR | Status: DC | PRN

## 2014-10-11 MED ORDER — SIMVASTATIN 10 MG PO TABS
10.0000 mg | ORAL_TABLET | Freq: Every day | ORAL | Status: DC
  Administered 2014-10-11 – 2014-10-12 (×2): 10 mg via ORAL
  Filled 2014-10-11 (×3): qty 1

## 2014-10-11 MED ORDER — HYDROMORPHONE HCL 1 MG/ML IJ SOLN
0.5000 mg | Freq: Once | INTRAMUSCULAR | Status: AC
  Administered 2014-10-11: 0.5 mg via INTRAVENOUS
  Filled 2014-10-11: qty 1

## 2014-10-11 MED ORDER — HYDROCODONE-ACETAMINOPHEN 5-325 MG PO TABS
1.0000 | ORAL_TABLET | Freq: Four times a day (QID) | ORAL | Status: DC | PRN
  Administered 2014-10-11 – 2014-10-13 (×5): 2 via ORAL
  Filled 2014-10-11 (×5): qty 2

## 2014-10-11 MED ORDER — METOPROLOL TARTRATE 25 MG PO TABS
25.0000 mg | ORAL_TABLET | Freq: Two times a day (BID) | ORAL | Status: DC
  Administered 2014-10-11 – 2014-10-13 (×4): 25 mg via ORAL
  Filled 2014-10-11 (×5): qty 1

## 2014-10-11 MED ORDER — METHOCARBAMOL 500 MG PO TABS
500.0000 mg | ORAL_TABLET | Freq: Four times a day (QID) | ORAL | Status: DC | PRN
  Administered 2014-10-12: 500 mg via ORAL
  Filled 2014-10-11 (×3): qty 1

## 2014-10-11 MED ORDER — LOSARTAN POTASSIUM 25 MG PO TABS
12.5000 mg | ORAL_TABLET | Freq: Every day | ORAL | Status: DC
  Administered 2014-10-12 – 2014-10-13 (×2): 12.5 mg via ORAL
  Filled 2014-10-11 (×2): qty 0.5

## 2014-10-11 MED ORDER — POLYVINYL ALCOHOL 1.4 % OP SOLN
1.0000 [drp] | OPHTHALMIC | Status: DC | PRN
  Filled 2014-10-11: qty 15

## 2014-10-11 MED ORDER — IOHEXOL 300 MG/ML  SOLN
80.0000 mL | Freq: Once | INTRAMUSCULAR | Status: AC | PRN
  Administered 2014-10-11: 80 mL via INTRAVENOUS

## 2014-10-11 MED ORDER — SODIUM CHLORIDE 0.9 % IV SOLN: 250.0000 mL | INTRAVENOUS | Status: DC | PRN

## 2014-10-11 MED ORDER — VITAMIN K1 10 MG/ML IJ SOLN
10.0000 mg | Freq: Once | INTRAVENOUS | Status: AC
  Administered 2014-10-11: 10 mg via INTRAVENOUS
  Filled 2014-10-11: qty 1

## 2014-10-11 MED ORDER — HYDROMORPHONE HCL 1 MG/ML IJ SOLN: 1.0000 mg | INTRAMUSCULAR | Status: AC | PRN

## 2014-10-11 MED ORDER — ONDANSETRON HCL 4 MG/2ML IJ SOLN: 4.0000 mg | Freq: Four times a day (QID) | INTRAMUSCULAR | Status: DC | PRN

## 2014-10-11 MED ORDER — MORPHINE SULFATE 2 MG/ML IJ SOLN: 2.0000 mg | INTRAMUSCULAR | Status: DC | PRN

## 2014-10-11 MED ORDER — SODIUM CHLORIDE 0.9 % IV BOLUS (SEPSIS)
1000.0000 mL | Freq: Once | INTRAVENOUS | Status: AC
  Administered 2014-10-11: 1000 mL via INTRAVENOUS

## 2014-10-11 MED ORDER — FENTANYL CITRATE 0.05 MG/ML IJ SOLN
12.5000 ug | Freq: Once | INTRAMUSCULAR | Status: AC
  Administered 2014-10-11: 12.5 ug via INTRAVENOUS
  Filled 2014-10-11: qty 2

## 2014-10-11 NOTE — H&P (Addendum)
[facility] History and Physical  Amanda [name] ZHY:[id number] DOB: 1946-03-24 DOA: 10/11/2014   PCP: Hayden Rasmussen., MD    Chief Complaint: Pain in left groin  HPI: Amanda [name] is a 68 y.o. female with past medical history sleep apnea on C Pap, hypertension, depression, anxiety and diet-controlled diabetes who is status post right total knee replacement on 10/27. She was discharged home on heparin for DVT prophylaxis. She began to have left-sided groin pain about 3 days ago. She was due to see her orthopedic surgeon this past Monday and was evaluated by him in the office. According to the patient, the knee appears to be healing well. The left groin pain was discussed and at that point was determined that no further workup was needed. The pain has progressed over the past couple of days to a point where she is in excruciating pain when she sits up. She therefore came into the ER where a CT scan reveals a left psoas hematoma. INR is noted to be elevated at 8.26. He last had her INR checked when she was in the hospital.    General: The patient denies anorexia, fever, has had a 60 pound weight loss over the past year  Cardiac: Denies chest pain, syncope, palpitations, pedal edema  Respiratory: Denies cough, shortness of breath, wheezing GI: Has been having some heartburn and nausea. She was having constipation but states that she had a normal BM yesterday.  GU: Denies hematuria, incontinence, dysuria  Musculoskeletal: Has arthritis Skin: Denies suspicious skin lesions Neurologic: Denies focal weakness or numbness, change in vision Psychiatry:  depression controlled on current medications-no significant anxiety  Past Medical History  Diagnosis Date  . Sleep apnea     cpap  . Complication of anesthesia     "forgets to breathe" when she wakes up  . Vertigo   . Heart murmur   . Mitral valve prolapse   . Asthma     seasonal or with bronchitis  . Pneumonia     hx of  .  Hypothyroidism   . Anxiety   . Headache   . Arthritis   . Hepatitis     A -- as teenager  . Hypertension   . GERD (gastroesophageal reflux disease)   . Diabetes type 2, controlled     fasting 100-120  . Depression   . Hyperlipidemia     Past Surgical History  Procedure Laterality Date  . Gallbladder surgery  1995  . Nasal sinus surgery  2005  . Throat surgery  2005  . Partial hysterectomy  1983  . Cholecystectomy    . Abdominal hysterectomy    . Joint replacement Left     knee  . Fracture surgery Right     leg  . Total knee arthroplasty Right 09/12/2014    Procedure: TOTAL KNEE ARTHROPLASTY;  Surgeon: Meredith Pel, MD;  Location: [place];  Service: Orthopedics;  Laterality: Right;    Social History: does not smoke or drink alcohol Lives at home alone   Allergies  Allergen Reactions  . Aciphex [Rabeprazole Sodium] Hives  . Erythromycin Nausea And Vomiting  . Macrodantin Nausea And Vomiting  . Nsaids     Bleeding in bladder  . Omeprazole-Sodium Bicarbonate [Omeprazole-Sodium Bicarbonate] Hives  . Sulfa Antibiotics Nausea Only and Other (See Comments)    Feel terrible  . Tape Rash    Family History  Problem Relation Age of Onset  . Cancer Mother     panceratic  .  Cancer - Colon Mother       Prior to Admission medications   Medication Sig Start Date End Date Taking? Authorizing Provider  albuterol (PROVENTIL HFA;VENTOLIN HFA) 108 (90 BASE) MCG/ACT inhaler Inhale 1 puff into the lungs every 6 (six) hours as needed for wheezing or shortness of breath.   Yes Historical Provider, MD  buPROPion (WELLBUTRIN XL) 300 MG 24 hr tablet Take 300 mg by mouth daily.     Yes Historical Provider, MD  chlorpheniramine (CHLOR-TRIMETON) 4 MG tablet Take 4 mg by mouth at bedtime as needed for allergies.    Yes Historical Provider, MD  Cholecalciferol (VITAMIN D3) 1000 UNITS CAPS Take 1,000 Units by mouth daily.    Yes Historical Provider, MD  clonazePAM (KLONOPIN) 0.5 MG  tablet Take 1 tablet (0.5 mg total) by mouth daily. 09/14/14  Yes Meredith Pel, MD  hydrochlorothiazide (MICROZIDE) 12.5 MG capsule Take 12.5 mg by mouth daily.     Yes Historical Provider, MD  HYDROcodone-acetaminophen (NORCO/VICODIN) 5-325 MG per tablet Take 1 tablet by mouth every 6 (six) hours as needed for moderate pain.   Yes Historical Provider, MD  levothyroxine (SYNTHROID, LEVOTHROID) 25 MCG tablet Take 25 mcg by mouth daily.     Yes Historical Provider, MD  losartan (COZAAR) 25 MG tablet Take 12.5 mg by mouth daily.    Yes Historical Provider, MD  metFORMIN (GLUCOPHAGE) 500 MG tablet Take 500 mg by mouth 2 (two) times daily with a meal.    Yes Historical Provider, MD  methocarbamol (ROBAXIN) 500 MG tablet Take 1 tablet (500 mg total) by mouth every 6 (six) hours as needed for muscle spasms. 09/14/14  Yes Meredith Pel, MD  metoprolol (LOPRESSOR) 50 MG tablet Take 25 mg by mouth 2 (two) times daily.     Yes Historical Provider, MD  omeprazole (PRILOSEC) 40 MG capsule Take 40 mg by mouth daily.     Yes Historical Provider, MD  ondansetron (ZOFRAN) 4 MG tablet Take 4 mg by mouth every 8 (eight) hours as needed for nausea or vomiting.   Yes Historical Provider, MD  OVER THE COUNTER MEDICATION Place 1 drop into both eyes 4 (four) times daily as needed (for dry eyes). [place] Drop   Yes Historical Provider, MD  oxyCODONE (OXY IR/ROXICODONE) 5 MG immediate release tablet Take 1 tablet (5 mg total) by mouth every 4 (four) hours as needed for breakthrough pain. 09/14/14  Yes Meredith Pel, MD  Propylene Glycol (SYSTANE BALANCE) 0.6 % SOLN Apply 1 drop to eye every 6 (six) hours as needed.    Yes Historical Provider, MD  simvastatin (ZOCOR) 10 MG tablet Take 10 mg by mouth at bedtime.     Yes Historical Provider, MD  traZODone (DESYREL) 100 MG tablet Take 100-200 mg by mouth at bedtime. Most of the time takes 100 mg   Yes Historical Provider, MD  venlafaxine (EFFEXOR)  50 MG tablet Take 100 mg by mouth daily.   Yes Historical Provider, MD  White Petrolatum-Mineral Oil (PURALUBE) 85-15 % OINT Apply 1 application to eye daily as needed.    Yes Historical Provider, MD  warfarin (COUMADIN) 5 MG tablet Take 1 tablet (5 mg total) by mouth daily. 09/14/14   Meredith Pel, MD     Physical Exam: Filed Vitals:   10/11/14 1430 10/11/14 1515 10/11/14 1746 10/11/14 1756  BP: 142/62 138/74 160/57 160/57  Pulse: 94 94 91 91  Temp:      TempSrc:  Resp: 19 15 16 16   Height:      Weight:      SpO2: 98% 97% 100% 99%     General: Awake alert oriented 3. HEENT: Normocephalic and Atraumatic, Mucous membranes pink                PERRLA; EOM intact; No scleral icterus,                 Nares: Patent, Oropharynx: Clear, Fair Dentition                 Neck: FROM, no cervical lymphadenopathy, thyromegaly, carotid bruit or JVD;  Breasts: deferred CHEST WALL: No tenderness  CHEST: Normal respiration, clear to auscultation bilaterally  HEART: Regular rate and rhythm; no murmurs rubs or gallops  BACK: No kyphosis or scoliosis; no CVA tenderness  ABDOMEN: Positive Bowel Sounds, soft, non-tender; no masses, no organomegaly- tender and left groin Rectal Exam: deferred EXTREMITIES: No cyanosis, clubbing, or edema-right knee incision is healing well Genitalia: not examined  SKIN:  no rash or ulceration  CNS: Alert and Oriented x 4, Nonfocal exam, CN 2-12 intact  Labs on Admission:  Basic Metabolic Panel:  Recent Labs Lab 10/11/14 1313  NA 130*  K 3.8  CL 92*  CO2 25  GLUCOSE 106*  BUN 14  CREATININE 0.75  CALCIUM 9.4   Liver Function Tests:  Recent Labs Lab 10/11/14 1313  AST 28  ALT 10  ALKPHOS 86  BILITOT 0.6  PROT 6.4  ALBUMIN 3.1*   No results for input(s): LIPASE, AMYLASE in the last 168 hours. No results for input(s): AMMONIA in the last 168 hours. CBC:  Recent Labs Lab 10/11/14 1313  WBC 11.5*  NEUTROABS 8.4*  HGB 9.8*  HCT  29.8*  MCV 85.4  PLT 354   Cardiac Enzymes: No results for input(s): CKTOTAL, CKMB, CKMBINDEX, TROPONINI in the last 168 hours.  BNP (last 3 results) No results for input(s): PROBNP in the last 8760 hours. CBG: No results for input(s): GLUCAP in the last 168 hours.  Radiological Exams on Admission: Ct Abdomen Pelvis W Contrast  10/11/2014   CLINICAL DATA:  Hemorrhagic fluid in the left side of the pelvis seen on recent CT left hip  EXAM: CT ABDOMEN AND PELVIS WITH CONTRAST  TECHNIQUE: Multidetector CT imaging of the abdomen and pelvis was performed using the standard protocol following bolus administration of intravenous contrast.  CONTRAST:  59mL OMNIPAQUE IOHEXOL 300 MG/ML  SOLN  COMPARISON:  None.  FINDINGS: The lung bases are clear.  The liver demonstrates no focal abnormality. There is no intrahepatic or extrahepatic biliary ductal dilatation. The gallbladder is surgically absent. The spleen demonstrates no focal abnormality.There is a 1.7 x 1.7 cm hypodense fluid attenuating right interpolar renal mass most consistent with a cyst. There is a 3 x 3.9 cm hypodense, fluid attenuating right inferior pole renal mass most consistent with a cyst. There is a 2 x 2 cm hypodense, fluid attenuating left inferior pole renal mass most consistent with a cyst. The adrenal glands and pancreas are normal. The bladder is unremarkable.  The stomach, duodenum, small intestine, and large intestine demonstrate no wall thickening or dilatation. There is no pneumoperitoneum, pneumatosis, or portal venous gas. There is no lymphadenopathy.  The abdominal aorta is normal in caliber.  There is expansion of the left iliopsoas muscle compare to the right, likely reflecting an intramuscular hematoma with a small amount of hemorrhagic fluid may in the retroperitoneum. There is a  small intramuscular hematoma involving the upper left quadratus lumborum muscle.  There are no lytic or sclerotic osseous lesions. No acute osseous  injury.  IMPRESSION: 1. Expansion of the left iliopsoas muscle likely reflecting an intramuscular hematoma with a small amount hemorrhage in the retroperitoneum. Small intramuscular hematoma involving the upper left quadratus lumborum muscle.   Electronically Signed   By: Kathreen Devoid   On: 10/11/2014 16:04   Ct Hip Left Wo Contrast  10/11/2014   CLINICAL DATA:  Left hip pain since the weekend.  EXAM: CT OF THE LEFT HIP WITHOUT CONTRAST  TECHNIQUE: Multidetector CT imaging of the left hip was performed according to the standard protocol. Multiplanar CT image reconstructions were also generated.  COMPARISON:  None.  FINDINGS: No acute left hip fracture, dislocation or avascular necrosis. No lytic or sclerotic osseous lesion.  No focal fluid collection or hematoma. The muscles are normal. No subcutaneous emphysema.  There is a small amount of high attenuation fluid and stranding in the visualized portion of the left side of the pelvis concerning for hemorrhage.  IMPRESSION: 1. No acute osseous injury of the left hip. 2. Small amount of hemorrhage is identified in the left side of the pelvis from on certain etiology. A dedicated CT of the abdomen and pelvis is recommended. These results were called by telephone at the time of interpretation on 10/11/2014 at 12:39 pm to [facility] , who verbally acknowledged these results.   Electronically Signed   By: Kathreen Devoid   On: 10/11/2014 12:42     Assessment/Plan Principal Problem:   Hematoma of left psoas -secondary to to anticoagulant therapy -Reverse Coumadin with 2 units of FFP-has also received 10 units of vitamin K in the ER -She was told on Monday that she no longer needs to take Coumadin for DVT prophylaxis in relation to recent surgery-her last dose was on Monday -Pain control with Vicodin and morphine -Follow hemoglobin closely-it does not appear to have dropped yet -PT eval ordered-patient is hoping that she will not need to return to skilled  nursing this time however she does live alone and may have difficulty returning home if pain remains uncontrolled -Have ordered stool softener/laxative to prevent constipation  Active Problems:  Coagulopathy secondary to warfarin use -See above -Check INR 30 minutes after FFP transfused and again tomorrow morning-    Acute hyponatremia -Hold HCTZ and recheck in the morning    Hypertension -Continue metoprolol and losartan    Diabetes type 2, controlled -Continue diet control-patient states that she would order appropriate food from the menu    S/P total knee arthroplasty -Evaluated by ortho (Dr Marlou Sa) a couple of days ago-appears to be stable  Depression and anxiety -Continue home medications  Hypothyroidism -Continue levothyroxine  Gastroesophageal reflux disease -Continue PPI    Consulted: None  Code Status: DO NOT RESUSCITATE Family Communication:  DVT Prophylaxis: Supratherapeutic INR-can start SCDs once INR improves  Time spent: 47 min  [place], MD [facility]  If 7PM-7AM, please contact night-coverage [email or web address] 10/11/2014, 6:27 PM

## 2014-10-11 NOTE — ED Notes (Signed)
Spoke with pharmacy, they said to give the vitamin k before the plasma.

## 2014-10-11 NOTE — ED Provider Notes (Signed)
Medical screening examination/treatment/procedure(s) were conducted as a shared visit with non-physician practitioner(s) and myself.  I personally evaluated the patient during the encounter.   EKG Interpretation None     Patient complains of pain to her left lateral hip times several days which started after she was doing physical therapy. Saw her orthopedist to days ago for similar symptoms diagnosed with musculoskeletal condition. No prescriptions given. On exam today she is tender with leg raise as well as at the left lateral hip. Will obtain CT of hip to rule out fracture and get pain medication  Leota Jacobsen, MD 10/11/14 1039

## 2014-10-11 NOTE — ED Notes (Addendum)
Per [facility] pt reports left groin pain/hip pain radiating down the left leg. Sensory intact NAD at this tike. Alert X 4. Pt stated oxycodone 5mg  taken at 7am with no relief.

## 2014-10-11 NOTE — ED Provider Notes (Signed)
CSN: [id number]     Arrival date & time 10/11/14  6160 History   First MD Initiated Contact with Patient 10/11/14 [phone]     Chief Complaint  Patient presents with  . Leg Pain     (Consider location/radiation/quality/duration/timing/severity/associated sxs/prior Treatment) HPI Comments: The patient is a 68 year old female with past medical history of hypertension, diabetes, depression MVP presenting with a chief complaint of left hip and left groin pain for 4 days. Patient reports increase in discomfort with movement. Denies recent fall or injury. She reports new exercises and PT which consisted of walking up stairs with cane, 2 days prior to onset of pain. Denies lower extremity edema, fever, history of orthopedic procedures to Left hip, abdominal pain. Patient was recently evaluated by Dr. Marlou Sa for similar complaints 2 days ago, diagnosed with pulled muscle. Reports taking hydrocodone and oxycodone without resolution of symptoms. Reports compliance with Coumadin up until yesterday she was scheduled to stop.  Patient is a 68 y.o. female presenting with leg pain. The history is provided by the patient. No language interpreter was used.  Leg Pain Associated symptoms: no fever     Past Medical History  Diagnosis Date  . Sleep apnea     cpap  . Complication of anesthesia     "forgets to breathe" when she wakes up  . Vertigo   . Heart murmur   . Mitral valve prolapse   . Asthma     seasonal or with bronchitis  . Pneumonia     hx of  . Hypothyroidism   . Anxiety   . Headache   . Arthritis   . Hepatitis     A -- as teenager  . Hypertension   . GERD (gastroesophageal reflux disease)   . Diabetes type 2, controlled     fasting 100-120  . Depression   . Hyperlipidemia    Past Surgical History  Procedure Laterality Date  . Gallbladder surgery  1995  . Nasal sinus surgery  2005  . Throat surgery  2005  . Partial hysterectomy  1983  . Cholecystectomy    . Abdominal hysterectomy     . Joint replacement Left     knee  . Fracture surgery Right     leg  . Total knee arthroplasty Right 09/12/2014    Procedure: TOTAL KNEE ARTHROPLASTY;  Surgeon: Meredith Pel, MD;  Location: [place];  Service: Orthopedics;  Laterality: Right;   Family History  Problem Relation Age of Onset  . Cancer Mother     panceratic  . Cancer - Colon Mother    History  Substance Use Topics  . Smoking status: Never Smoker   . Smokeless tobacco: Never Used  . Alcohol Use: No     Comment: socially   OB History    No data available     Review of Systems  Constitutional: Negative for fever and chills.  Gastrointestinal: Negative for vomiting, abdominal pain and diarrhea.  Musculoskeletal: Positive for arthralgias.  Skin: Negative for wound.      Allergies  Aciphex; Erythromycin; Macrodantin; Nsaids; Omeprazole-sodium bicarbonate; Sulfa antibiotics; and Tape  Home Medications   Prior to Admission medications   Medication Sig Start Date End Date Taking? Authorizing Provider  albuterol (PROVENTIL HFA;VENTOLIN HFA) 108 (90 BASE) MCG/ACT inhaler Inhale 1 puff into the lungs every 6 (six) hours as needed for wheezing or shortness of breath.    Historical Provider, MD  buPROPion (WELLBUTRIN XL) 300 MG 24 hr tablet Take 300 mg  by mouth daily.      Historical Provider, MD  chlorpheniramine (CHLOR-TRIMETON) 4 MG tablet Take 4 mg by mouth at bedtime as needed for allergies.     Historical Provider, MD  Cholecalciferol (VITAMIN D3) 1000 UNITS CAPS Take 1,000 Units by mouth daily.     Historical Provider, MD  clonazePAM (KLONOPIN) 0.5 MG tablet Take 1 tablet (0.5 mg total) by mouth daily. 09/14/14   Meredith Pel, MD  hydrochlorothiazide (MICROZIDE) 12.5 MG capsule Take 12.5 mg by mouth daily.      Historical Provider, MD  levothyroxine (SYNTHROID, LEVOTHROID) 25 MCG tablet Take 25 mcg by mouth daily.      Historical Provider, MD  losartan (COZAAR) 25 MG tablet Take 12.5 mg by mouth daily.      Historical Provider, MD  metFORMIN (GLUCOPHAGE) 500 MG tablet Take 500 mg by mouth 2 (two) times daily with a meal.     Historical Provider, MD  methocarbamol (ROBAXIN) 500 MG tablet Take 1 tablet (500 mg total) by mouth every 6 (six) hours as needed for muscle spasms. 09/14/14   Meredith Pel, MD  metoprolol (LOPRESSOR) 50 MG tablet Take 25 mg by mouth 2 (two) times daily.      Historical Provider, MD  omeprazole (PRILOSEC) 40 MG capsule Take 40 mg by mouth daily.      Historical Provider, MD  OVER THE COUNTER MEDICATION Place 1 drop into both eyes 4 (four) times daily. [place] Drop    Historical Provider, MD  oxyCODONE (OXY IR/ROXICODONE) 5 MG immediate release tablet Take 1 tablet (5 mg total) by mouth every 4 (four) hours as needed for breakthrough pain. 09/14/14   Meredith Pel, MD  Propylene Glycol (SYSTANE BALANCE) 0.6 % SOLN Apply 1 drop to eye every 6 (six) hours.    Historical Provider, MD  simvastatin (ZOCOR) 10 MG tablet Take 10 mg by mouth at bedtime.      Historical Provider, MD  traZODone (DESYREL) 100 MG tablet Take 100-200 mg by mouth at bedtime. Most of the time takes 100 mg    Historical Provider, MD  venlafaxine (EFFEXOR) 50 MG tablet Take 100 mg by mouth daily.    Historical Provider, MD  warfarin (COUMADIN) 5 MG tablet Take 1 tablet (5 mg total) by mouth daily. 09/14/14   Meredith Pel, MD  White Petrolatum-Mineral Oil (PURALUBE) 85-15 % OINT Apply 1 application to eye daily.    Historical Provider, MD   There were no vitals taken for this visit. Physical Exam  Constitutional: She is oriented to person, place, and time. She appears well-developed and well-nourished. No distress.  HENT:  Head: Normocephalic and atraumatic.  Neck: Neck supple.  Cardiovascular: Normal rate and regular rhythm.   Pulses:      Dorsalis pedis pulses are 1+ on the right side, and 1+ on the left side.  Pulmonary/Chest: Effort normal. She has no wheezes.   Abdominal: Soft. There is no tenderness. There is no rebound and no guarding.  Musculoskeletal: She exhibits tenderness.  Left hip tender to palpation over greater trochanter care. No overlying erythema or swelling. No obvious deformity. Normal internal and neck sternal rotation without discomfort. Discomfort with passive and active flexion of the left hip. No swelling increase in warmth.  Neurological: She is alert and oriented to person, place, and time.  Skin: Skin is dry. She is not diaphoretic.  Psychiatric: She has a normal mood and affect. Her behavior is normal.  Nursing note  and vitals reviewed.   ED Course  Procedures (including critical care time) Labs Review Labs Reviewed - No data to display  Imaging Review Ct Abdomen Pelvis W Contrast  10/11/2014   CLINICAL DATA:  Hemorrhagic fluid in the left side of the pelvis seen on recent CT left hip  EXAM: CT ABDOMEN AND PELVIS WITH CONTRAST  TECHNIQUE: Multidetector CT imaging of the abdomen and pelvis was performed using the standard protocol following bolus administration of intravenous contrast.  CONTRAST:  81mL OMNIPAQUE IOHEXOL 300 MG/ML  SOLN  COMPARISON:  None.  FINDINGS: The lung bases are clear.  The liver demonstrates no focal abnormality. There is no intrahepatic or extrahepatic biliary ductal dilatation. The gallbladder is surgically absent. The spleen demonstrates no focal abnormality.There is a 1.7 x 1.7 cm hypodense fluid attenuating right interpolar renal mass most consistent with a cyst. There is a 3 x 3.9 cm hypodense, fluid attenuating right inferior pole renal mass most consistent with a cyst. There is a 2 x 2 cm hypodense, fluid attenuating left inferior pole renal mass most consistent with a cyst. The adrenal glands and pancreas are normal. The bladder is unremarkable.  The stomach, duodenum, small intestine, and large intestine demonstrate no wall thickening or dilatation. There is no pneumoperitoneum, pneumatosis, or  portal venous gas. There is no lymphadenopathy.  The abdominal aorta is normal in caliber.  There is expansion of the left iliopsoas muscle compare to the right, likely reflecting an intramuscular hematoma with a small amount of hemorrhagic fluid may in the retroperitoneum. There is a small intramuscular hematoma involving the upper left quadratus lumborum muscle.  There are no lytic or sclerotic osseous lesions. No acute osseous injury.  IMPRESSION: 1. Expansion of the left iliopsoas muscle likely reflecting an intramuscular hematoma with a small amount hemorrhage in the retroperitoneum. Small intramuscular hematoma involving the upper left quadratus lumborum muscle.   Electronically Signed   By: Kathreen Devoid   On: 10/11/2014 16:04     EKG Interpretation None      MDM   Final diagnoses:  Left hip pain  Abnormal CT scan  Psoas hematoma, left, secondary to anticoagulant therapy, initial encounter   Pt presents with left hip pain after PT therapy, followed by Dr. Marlou Sa, Dx with muscular strain this week. Likely strain and bursitis. Plan to imaging.  CT shows small amount of blood in abdomen of unclear origin. INR 8.26 supratheraputic, last dose yesterday. CT shows left iliopsoas much hematoma. FFP and Vitamin K given. Dr. Zenia Resides also evaluated the patient. Consult to Hospitalist, [facility] agrees to admit the patient       Harvie Heck, PA-C 10/13/14 1407

## 2014-10-11 NOTE — ED Notes (Signed)
2 units plasma ready

## 2014-10-11 NOTE — Progress Notes (Signed)
RT placed patient on CPAP. PT home setting is 5 cmH2O. Sterile water added to chamber for humidification. Patient tolerating well. RT to continue to monitor.

## 2014-10-12 ENCOUNTER — Encounter (HOSPITAL_COMMUNITY): Payer: Self-pay | Admitting: General Practice

## 2014-10-12 DIAGNOSIS — E119 Type 2 diabetes mellitus without complications: Secondary | ICD-10-CM

## 2014-10-12 LAB — GLUCOSE, CAPILLARY
GLUCOSE-CAPILLARY: 139 mg/dL — AB (ref 70–99)
GLUCOSE-CAPILLARY: 137 mg/dL — AB (ref 70–99)
Glucose-Capillary: 123 mg/dL — ABNORMAL HIGH (ref 70–99)

## 2014-10-12 LAB — PREPARE FRESH FROZEN PLASMA
Unit division: 0
Unit division: 0

## 2014-10-12 LAB — BASIC METABOLIC PANEL
ANION GAP: 11 (ref 5–15)
BUN: 9 mg/dL (ref 6–23)
CO2: 27 mEq/L (ref 19–32)
CREATININE: 0.74 mg/dL (ref 0.50–1.10)
Calcium: 9.2 mg/dL (ref 8.4–10.5)
Chloride: 96 mEq/L (ref 96–112)
GFR calc non Af Amer: 85 mL/min — ABNORMAL LOW (ref >90)
Glucose, Bld: 94 mg/dL (ref 70–99)
Potassium: 3.8 mEq/L (ref 3.7–5.3)
Sodium: 134 mEq/L — ABNORMAL LOW (ref 137–147)

## 2014-10-12 LAB — CBC
HCT: 25.6 % — ABNORMAL LOW (ref 36.0–46.0)
HEMOGLOBIN: 8.3 g/dL — AB (ref 12.0–15.0)
MCH: 27.1 pg (ref 26.0–34.0)
MCHC: 32.4 g/dL (ref 30.0–36.0)
MCV: 83.7 fL (ref 78.0–100.0)
Platelets: 286 10*3/uL (ref 150–400)
RBC: 3.06 MIL/uL — ABNORMAL LOW (ref 3.87–5.11)
RDW: 13.5 % (ref 11.5–15.5)
WBC: 8.3 10*3/uL (ref 4.0–10.5)

## 2014-10-12 LAB — HEMOGLOBIN A1C
Hgb A1c MFr Bld: 5.9 % — ABNORMAL HIGH (ref <5.7)
Mean Plasma Glucose: 123 mg/dL — ABNORMAL HIGH (ref <117)

## 2014-10-12 LAB — HEMOGLOBIN AND HEMATOCRIT, BLOOD
HCT: 27.2 % — ABNORMAL LOW (ref 36.0–46.0)
Hemoglobin: 8.9 g/dL — ABNORMAL LOW (ref 12.0–15.0)

## 2014-10-12 LAB — PROTIME-INR
INR: 1.31 (ref 0.00–1.49)
PROTHROMBIN TIME: 16.5 s — AB (ref 11.6–15.2)

## 2014-10-12 MED ORDER — FUROSEMIDE 10 MG/ML IJ SOLN
20.0000 mg | Freq: Once | INTRAMUSCULAR | Status: AC
  Administered 2014-10-12: 20 mg via INTRAVENOUS
  Filled 2014-10-12: qty 2

## 2014-10-12 MED ORDER — INSULIN ASPART 100 UNIT/ML ~~LOC~~ SOLN
0.0000 [IU] | Freq: Three times a day (TID) | SUBCUTANEOUS | Status: DC
Start: 2014-10-12 — End: 2014-10-13
  Administered 2014-10-12 – 2014-10-13 (×3): 1 [IU] via SUBCUTANEOUS

## 2014-10-12 MED ORDER — INSULIN ASPART 100 UNIT/ML ~~LOC~~ SOLN
0.0000 [IU] | Freq: Every day | SUBCUTANEOUS | Status: DC
Start: 2014-10-12 — End: 2014-10-13

## 2014-10-12 NOTE — Progress Notes (Signed)
Patient Demographics  Amanda [name], is a 68 y.o. female, DOB - 04/19/1946, YYQ:[id number]  Admit date - 10/11/2014   Admitting Physician Debbe Odea, MD  Outpatient Primary MD for the patient is Hayden Rasmussen., MD  LOS - 1   Chief Complaint  Patient presents with  . Leg Pain        Subjective:   Amanda [name] today has, No headache, No chest pain, No abdominal pain - No Nausea, No new weakness tingling or numbness, No Cough - SOB. Mild L groin pain.   Assessment & Plan    1.Hematoma of left psoas region to anticoagulant therapy - with supratherapeutic INR of 8.2 upon admission. INR was adequately reversed with FFP's and vitamin K, currently INR is 1.3. H&H has dropped somewhat due to bleeding related anemia, at this time we'll continue to monitor H&H, goal will be to keep hemoglobin above 7.5. Transfuse as needed.    2. Recent right total knee arthroplasty. Was on Coumadin for DVT prophylaxis and per patient her last dose of Coumadin was past Monday, currently SCDs for DVT prophylaxis.    3. DM type II. Was on Glucophage at home, will place on sliding scale insulin and monitor.   No results found for: HGBA1C  CBG (last 3)  No results for input(s): GLUCAP in the last 72 hours.    4. Essential hypertension. On Lopressor and ARB, Continue    5. GERD. Continue PPI.    6. Hypothyroidism. Continue home dose Synthroid.        Code Status: DNR  Family Communication: none present  Disposition Plan: Home   Procedures     Consults      Medications  Scheduled Meds: . buPROPion  300 mg Oral Daily  . clonazePAM  0.5 mg Oral Daily  . docusate sodium  100 mg Oral BID  . furosemide  20 mg Intravenous Once  . levothyroxine  25 mcg Oral QAC breakfast  . losartan   12.5 mg Oral Daily  . metoprolol  25 mg Oral BID  . pantoprazole  40 mg Oral Daily  . simvastatin  10 mg Oral QHS  . traZODone  100-200 mg Oral QHS  . venlafaxine  100 mg Oral Daily   Continuous Infusions:  PRN Meds:.acetaminophen **OR** [DISCONTINUED] acetaminophen, albuterol, alum & mag hydroxide-simeth, HYDROcodone-acetaminophen, methocarbamol, morphine injection, [DISCONTINUED] ondansetron **OR** ondansetron (ZOFRAN) IV, polyethylene glycol, polyvinyl alcohol  DVT Prophylaxis  SCDs    Lab Results  Component Value Date   PLT 286 10/12/2014    Antibiotics     Anti-infectives    None          Objective:   Filed Vitals:   10/11/14 2135 10/12/14 0035 10/12/14 0039 10/12/14 0526  BP: 155/61 129/44 125/49 129/51  Pulse:   82 84  Temp: 99 F (37.2 C) 99.1 F (37.3 C) 99.5 F (37.5 C) 99 F (37.2 C)  TempSrc: Oral Oral Oral Oral  Resp: 18   12  Height:      Weight:      SpO2:   95% 96%    Wt Readings from Last 3 Encounters:  10/11/14 99.338 kg (219 lb)  09/12/14 102.513 kg (226 lb)  08/31/14 102.604 kg (226 lb 3.2  oz)     Intake/Output Summary (Last 24 hours) at 10/12/14 0859 Last data filed at 10/12/14 0630  Gross per 24 hour  Intake   1456 ml  Output    600 ml  Net    856 ml     Physical Exam  Awake Alert, Oriented X 3, No new F.N deficits, Normal affect [place].AT,PERRAL Supple Neck,No JVD, No cervical lymphadenopathy appriciated.  Symmetrical Chest wall movement, Good air movement bilaterally, CTAB RRR,No Gallops,Rubs or new Murmurs, No Parasternal Heave +ve B.Sounds, Abd Soft, No tenderness, No organomegaly appriciated, No rebound - guarding or rigidity. No Cyanosis, Clubbing or edema, No new Rash or bruise , R Knee post op site clean   Data Review   Micro Results No results found for this or any previous visit (from the past 240 hour(s)).  Radiology Reports Ct Abdomen Pelvis W Contrast  10/11/2014   CLINICAL DATA:  Hemorrhagic fluid in the  left side of the pelvis seen on recent CT left hip  EXAM: CT ABDOMEN AND PELVIS WITH CONTRAST  TECHNIQUE: Multidetector CT imaging of the abdomen and pelvis was performed using the standard protocol following bolus administration of intravenous contrast.  CONTRAST:  61mL OMNIPAQUE IOHEXOL 300 MG/ML  SOLN  COMPARISON:  None.  FINDINGS: The lung bases are clear.  The liver demonstrates no focal abnormality. There is no intrahepatic or extrahepatic biliary ductal dilatation. The gallbladder is surgically absent. The spleen demonstrates no focal abnormality.There is a 1.7 x 1.7 cm hypodense fluid attenuating right interpolar renal mass most consistent with a cyst. There is a 3 x 3.9 cm hypodense, fluid attenuating right inferior pole renal mass most consistent with a cyst. There is a 2 x 2 cm hypodense, fluid attenuating left inferior pole renal mass most consistent with a cyst. The adrenal glands and pancreas are normal. The bladder is unremarkable.  The stomach, duodenum, small intestine, and large intestine demonstrate no wall thickening or dilatation. There is no pneumoperitoneum, pneumatosis, or portal venous gas. There is no lymphadenopathy.  The abdominal aorta is normal in caliber.  There is expansion of the left iliopsoas muscle compare to the right, likely reflecting an intramuscular hematoma with a small amount of hemorrhagic fluid may in the retroperitoneum. There is a small intramuscular hematoma involving the upper left quadratus lumborum muscle.  There are no lytic or sclerotic osseous lesions. No acute osseous injury.  IMPRESSION: 1. Expansion of the left iliopsoas muscle likely reflecting an intramuscular hematoma with a small amount hemorrhage in the retroperitoneum. Small intramuscular hematoma involving the upper left quadratus lumborum muscle.   Electronically Signed   By: Kathreen Devoid   On: 10/11/2014 16:04   Ct Hip Left Wo Contrast  10/11/2014   CLINICAL DATA:  Left hip pain since the weekend.   EXAM: CT OF THE LEFT HIP WITHOUT CONTRAST  TECHNIQUE: Multidetector CT imaging of the left hip was performed according to the standard protocol. Multiplanar CT image reconstructions were also generated.  COMPARISON:  None.  FINDINGS: No acute left hip fracture, dislocation or avascular necrosis. No lytic or sclerotic osseous lesion.  No focal fluid collection or hematoma. The muscles are normal. No subcutaneous emphysema.  There is a small amount of high attenuation fluid and stranding in the visualized portion of the left side of the pelvis concerning for hemorrhage.  IMPRESSION: 1. No acute osseous injury of the left hip. 2. Small amount of hemorrhage is identified in the left side of the pelvis from on certain  etiology. A dedicated CT of the abdomen and pelvis is recommended. These results were called by telephone at the time of interpretation on 10/11/2014 at 12:39 pm to [facility] , who verbally acknowledged these results.   Electronically Signed   By: Kathreen Devoid   On: 10/11/2014 12:42     CBC  Recent Labs Lab 10/11/14 1313 10/12/14 0415  WBC 11.5* 8.3  HGB 9.8* 8.3*  HCT 29.8* 25.6*  PLT 354 286  MCV 85.4 83.7  MCH 28.1 27.1  MCHC 32.9 32.4  RDW 13.5 13.5  LYMPHSABS 1.9  --   MONOABS 1.1*  --   EOSABS 0.1  --   BASOSABS 0.0  --     Chemistries   Recent Labs Lab 10/11/14 1313 10/12/14 0415  NA 130* 134*  K 3.8 3.8  CL 92* 96  CO2 25 27  GLUCOSE 106* 94  BUN 14 9  CREATININE 0.75 0.74  CALCIUM 9.4 9.2  AST 28  --   ALT 10  --   ALKPHOS 86  --   BILITOT 0.6  --    ------------------------------------------------------------------------------------------------------------------ estimated creatinine clearance is 76.8 mL/min (by C-G formula based on Cr of 0.74). ------------------------------------------------------------------------------------------------------------------ No results for input(s): HGBA1C in the last 72  hours. ------------------------------------------------------------------------------------------------------------------ No results for input(s): CHOL, HDL, LDLCALC, TRIG, CHOLHDL, LDLDIRECT in the last 72 hours. ------------------------------------------------------------------------------------------------------------------ No results for input(s): TSH, T4TOTAL, T3FREE, THYROIDAB in the last 72 hours.  Invalid input(s): FREET3 ------------------------------------------------------------------------------------------------------------------ No results for input(s): VITAMINB12, FOLATE, FERRITIN, TIBC, IRON, RETICCTPCT in the last 72 hours.  Coagulation profile  Recent Labs Lab 10/11/14 1313 10/11/14 2040 10/12/14 0415  INR 8.26* 1.79* 1.31    No results for input(s): DDIMER in the last 72 hours.  Cardiac Enzymes No results for input(s): CKMB, TROPONINI, MYOGLOBIN in the last 168 hours.  Invalid input(s): CK ------------------------------------------------------------------------------------------------------------------ Invalid input(s): POCBNP     Time Spent in minutes   35   Artia Singley K M.D on 10/12/2014 at 8:59 AM  Between 7am to 7pm - Pager - [phone]  After 7pm go to [email or web address] - password [email or web address]  And look for the night coverage person covering for me after hours  [facility] Office  [phone]

## 2014-10-13 DIAGNOSIS — R938 Abnormal findings on diagnostic imaging of other specified body structures: Secondary | ICD-10-CM

## 2014-10-13 DIAGNOSIS — R9389 Abnormal findings on diagnostic imaging of other specified body structures: Secondary | ICD-10-CM | POA: Insufficient documentation

## 2014-10-13 LAB — GLUCOSE, CAPILLARY
Glucose-Capillary: 122 mg/dL — ABNORMAL HIGH (ref 70–99)
Glucose-Capillary: 131 mg/dL — ABNORMAL HIGH (ref 70–99)

## 2014-10-13 LAB — HEMOGLOBIN AND HEMATOCRIT, BLOOD
HEMATOCRIT: 25.8 % — AB (ref 36.0–46.0)
HEMOGLOBIN: 8.3 g/dL — AB (ref 12.0–15.0)

## 2014-10-13 MED ORDER — METFORMIN HCL 500 MG PO TABS: 500.0000 mg | ORAL_TABLET | Freq: Two times a day (BID) | ORAL | Status: DC

## 2014-10-13 NOTE — Discharge Summary (Signed)
LINDELL TUSSEY, is a 68 y.o. female  DOB [birth year]  MRN [id number].  Admission date:  10/11/2014  Admitting Physician  Debbe Odea, MD  Discharge Date:  10/13/2014   Primary MD  Hayden Rasmussen., MD  Recommendations for primary care physician for things to follow:   CBC, BMP INR in 2 days.   Admission Diagnosis  Abnormal CT scan [R93.8] Left hip pain [M25.552] Psoas hematoma, left, secondary to anticoagulant therapy, initial encounter [S30.1XXA]   Discharge Diagnosis  Abnormal CT scan [R93.8] Left hip pain [M25.552] Psoas hematoma, left, secondary to anticoagulant therapy, initial encounter [S30.1XXA]    Principal Problem:   Hematoma of left psoas region to anticoagulant therapy Active Problems:   Hypertension   Diabetes type 2, controlled   S/P total knee arthroplasty   Coagulopathy   Psoas hematoma, left, secondary to anticoagulant therapy   Acute hyponatremia   Abnormal CT scan      Past Medical History  Diagnosis Date  . Complication of anesthesia     "forgets to breathe" when she wakes up  . Vertigo   . Heart murmur   . Mitral valve prolapse   . Asthma     seasonal or with bronchitis  . Pneumonia     hx of  . Hypothyroidism   . Anxiety   . Headache   . Arthritis   . Hepatitis     A -- as teenager  . Hypertension   . GERD (gastroesophageal reflux disease)   . Diabetes type 2, controlled     fasting 100-120  . Depression   . Hyperlipidemia   . OSA on CPAP     Past Surgical History  Procedure Laterality Date  . Gallbladder surgery  1995  . Nasal sinus surgery  2005  . Throat surgery  2005  . Partial hysterectomy  1983  . Cholecystectomy    . Abdominal hysterectomy    . Joint replacement Left     knee  . Fracture surgery Right     leg  . Total knee arthroplasty Right 09/12/2014   Procedure: TOTAL KNEE ARTHROPLASTY;  Surgeon: Meredith Pel, MD;  Location: [facility];  Service: Orthopedics;  Laterality: Right;       History of present illness and  Hospital Course:     Kindly see H&P for history of present illness and admission details, please review complete Labs, Consult reports and Test reports for all details in brief  HPI  from the history and physical done on the day of admission   ASSUNTA PUPO is a 68 y.o. female with past medical history sleep apnea on C Pap, hypertension, depression, anxiety and diet-controlled diabetes who is status post right total knee replacement on 10/27. She was discharged home on heparin for DVT prophylaxis. She began to have left-sided groin pain about 3 days ago. She was due to see her orthopedic surgeon this past Monday and was evaluated by him in the office. According to the patient, the knee appears to be healing  well. The left groin pain was discussed and at that point was determined that no further workup was needed. The pain has progressed over the past couple of days to a point where she is in excruciating pain when she sits up. She therefore came into the ER where a CT scan reveals a left psoas hematoma. INR is noted to be elevated at 8.26. He last had her INR checked when she was in the hospital.   Hospital Course    1.Hematoma of left psoas region to anticoagulant therapy - with supratherapeutic INR of 8.2 upon admission. INR was adequately reversed with FFP's and vitamin K, currently INR is 1.3. H&H has dropped somewhat due to bleeding related anemia, overall stable in the last 24 hours, her left-sided groin pain has resolved, no need for transfusion. She feels much better. No abdominal or groin pain now. Will be discharged with follow-up with PCP in 2 days for repeat CBC BMP and INR.    2. Recent right total knee arthroplasty. Was on Coumadin for DVT prophylaxis and per patient her last dose of Coumadin was past  Monday.    3. DM type II. Low-carb diet and Glucophage from tomorrow.  Lab Results  Component Value Date   HGBA1C 5.9* 10/12/2014     4. Essential hypertension. On Lopressor and ARB, Continue    5. GERD. Continue PPI.    6. Hypothyroidism. Continue home dose Synthroid.     Discharge Condition: Stable   Follow UP  Follow-up Information    Follow up with [facility] MD. Schedule an appointment as soon as possible for a visit in 2 days.   Specialty:  Family Medicine   Contact information:   [address] [place] [address] [phone]         Discharge Instructions  and  Discharge Medications          Discharge Instructions    Discharge instructions    Complete by:  As directed   Follow with Primary MD Hayden Rasmussen., MD in 2 days   Get CBC, BMP, INR checked  by Primary MD next visit.    Activity: As tolerated with Full fall precautions use walker/cane & assistance as needed   Disposition Home     Diet: Heart Healthy  - Low Carb  For Heart failure patients - Check your Weight same time everyday, if you gain over 2 pounds, or you develop in leg swelling, experience more shortness of breath or chest pain, call your Primary MD immediately. Follow Cardiac Low Salt Diet and 1.8 lit/day fluid restriction.   On your next visit with your primary care physician please Get Medicines reviewed and adjusted.   Please request your Prim.MD to go over all Hospital Tests and Procedure/Radiological results at the follow up, please get all Hospital records sent to your Prim MD by signing hospital release before you go home.   If you experience worsening of your admission symptoms, develop shortness of breath, life threatening emergency, suicidal or homicidal thoughts you must seek medical attention immediately by calling 911 or calling your MD immediately  if symptoms less severe.  You Must read complete instructions/literature  along with all the possible adverse reactions/side effects for all the Medicines you take and that have been prescribed to you. Take any new Medicines after you have completely understood and accpet all the possible adverse reactions/side effects.   Do not drive, operating heavy machinery, perform activities at heights, swimming or participation in  water activities or provide baby sitting services if your were admitted for syncope or siezures until you have seen by Primary MD or a Neurologist and advised to do so again.  Do not drive when taking Pain medications.    Do not take more than prescribed Pain, Sleep and Anxiety Medications  Special Instructions: If you have smoked or chewed Tobacco  in the last 2 yrs please stop smoking, stop any regular Alcohol  and or any Recreational drug use.  Wear Seat belts while driving.   Please note  You were cared for by a hospitalist during your hospital stay. If you have any questions about your discharge medications or the care you received while you were in the hospital after you are discharged, you can call the unit and asked to speak with the hospitalist on call if the hospitalist that took care of you is not available. Once you are discharged, your primary care physician will handle any further medical issues. Please note that NO REFILLS for any discharge medications will be authorized once you are discharged, as it is imperative that you return to your primary care physician (or establish a relationship with a primary care physician if you do not have one) for your aftercare needs so that they can reassess your need for medications and monitor your lab values.     Increase activity slowly    Complete by:  As directed             Medication List    STOP taking these medications        warfarin 5 MG tablet  Commonly known as:  COUMADIN      TAKE these medications        albuterol 108 (90 BASE) MCG/ACT inhaler  Commonly known as:   PROVENTIL HFA;VENTOLIN HFA  Inhale 1 puff into the lungs every 6 (six) hours as needed for wheezing or shortness of breath.     buPROPion 300 MG 24 hr tablet  Commonly known as:  WELLBUTRIN XL  Take 300 mg by mouth daily.     chlorpheniramine 4 MG tablet  Commonly known as:  CHLOR-TRIMETON  Take 4 mg by mouth at bedtime as needed for allergies.     clonazePAM 0.5 MG tablet  Commonly known as:  KLONOPIN  Take 1 tablet (0.5 mg total) by mouth daily.     hydrochlorothiazide 12.5 MG capsule  Commonly known as:  MICROZIDE  Take 12.5 mg by mouth daily.     HYDROcodone-acetaminophen 5-325 MG per tablet  Commonly known as:  NORCO/VICODIN  Take 1 tablet by mouth every 6 (six) hours as needed for moderate pain.     levothyroxine 25 MCG tablet  Commonly known as:  SYNTHROID, LEVOTHROID  Take 25 mcg by mouth daily.     losartan 25 MG tablet  Commonly known as:  COZAAR  Take 12.5 mg by mouth daily.     metFORMIN 500 MG tablet  Commonly known as:  GLUCOPHAGE  Take 500 mg by mouth 2 (two) times daily with a meal.     methocarbamol 500 MG tablet  Commonly known as:  ROBAXIN  Take 1 tablet (500 mg total) by mouth every 6 (six) hours as needed for muscle spasms.     metoprolol 50 MG tablet  Commonly known as:  LOPRESSOR  Take 25 mg by mouth 2 (two) times daily.     omeprazole 40 MG capsule  Commonly known as:  PRILOSEC  Take 40 mg  by mouth daily.     ondansetron 4 MG tablet  Commonly known as:  ZOFRAN  Take 4 mg by mouth every 8 (eight) hours as needed for nausea or vomiting.     OVER THE COUNTER MEDICATION  Place 1 drop into both eyes 4 (four) times daily as needed (for dry eyes). FreshKote Lubricant Eye Drop     oxyCODONE 5 MG immediate release tablet  Commonly known as:  Oxy IR/ROXICODONE  Take 1 tablet (5 mg total) by mouth every 4 (four) hours as needed for breakthrough pain.     PURALUBE 85-15 % Oint  Apply 1 application to eye daily as needed.     simvastatin 10 MG  tablet  Commonly known as:  ZOCOR  Take 10 mg by mouth at bedtime.     SYSTANE BALANCE 0.6 % Soln  Generic drug:  Propylene Glycol  Apply 1 drop to eye every 6 (six) hours as needed.     traZODone 100 MG tablet  Commonly known as:  DESYREL  Take 100-200 mg by mouth at bedtime. Most of the time takes 100 mg     venlafaxine 50 MG tablet  Commonly known as:  EFFEXOR  Take 100 mg by mouth daily.     Vitamin D3 1000 UNITS Caps  Take 1,000 Units by mouth daily.          Diet and Activity recommendation: See Discharge Instructions above   Consults obtained - None   Major procedures and Radiology Reports - PLEASE review detailed and final reports for all details, in brief -       Ct Abdomen Pelvis W Contrast  10/11/2014   CLINICAL DATA:  Hemorrhagic fluid in the left side of the pelvis seen on recent CT left hip  EXAM: CT ABDOMEN AND PELVIS WITH CONTRAST  TECHNIQUE: Multidetector CT imaging of the abdomen and pelvis was performed using the standard protocol following bolus administration of intravenous contrast.  CONTRAST:  64mL OMNIPAQUE IOHEXOL 300 MG/ML  SOLN  COMPARISON:  None.  FINDINGS: The lung bases are clear.  The liver demonstrates no focal abnormality. There is no intrahepatic or extrahepatic biliary ductal dilatation. The gallbladder is surgically absent. The spleen demonstrates no focal abnormality.There is a 1.7 x 1.7 cm hypodense fluid attenuating right interpolar renal mass most consistent with a cyst. There is a 3 x 3.9 cm hypodense, fluid attenuating right inferior pole renal mass most consistent with a cyst. There is a 2 x 2 cm hypodense, fluid attenuating left inferior pole renal mass most consistent with a cyst. The adrenal glands and pancreas are normal. The bladder is unremarkable.  The stomach, duodenum, small intestine, and large intestine demonstrate no wall thickening or dilatation. There is no pneumoperitoneum, pneumatosis, or portal venous gas. There is no  lymphadenopathy.  The abdominal aorta is normal in caliber.  There is expansion of the left iliopsoas muscle compare to the right, likely reflecting an intramuscular hematoma with a small amount of hemorrhagic fluid may in the retroperitoneum. There is a small intramuscular hematoma involving the upper left quadratus lumborum muscle.  There are no lytic or sclerotic osseous lesions. No acute osseous injury.  IMPRESSION: 1. Expansion of the left iliopsoas muscle likely reflecting an intramuscular hematoma with a small amount hemorrhage in the retroperitoneum. Small intramuscular hematoma involving the upper left quadratus lumborum muscle.   Electronically Signed   By: Kathreen Devoid   On: 10/11/2014 16:04   Ct Hip Left Wo Contrast  10/11/2014  CLINICAL DATA:  Left hip pain since the weekend.  EXAM: CT OF THE LEFT HIP WITHOUT CONTRAST  TECHNIQUE: Multidetector CT imaging of the left hip was performed according to the standard protocol. Multiplanar CT image reconstructions were also generated.  COMPARISON:  None.  FINDINGS: No acute left hip fracture, dislocation or avascular necrosis. No lytic or sclerotic osseous lesion.  No focal fluid collection or hematoma. The muscles are normal. No subcutaneous emphysema.  There is a small amount of high attenuation fluid and stranding in the visualized portion of the left side of the pelvis concerning for hemorrhage.  IMPRESSION: 1. No acute osseous injury of the left hip. 2. Small amount of hemorrhage is identified in the left side of the pelvis from on certain etiology. A dedicated CT of the abdomen and pelvis is recommended. These results were called by telephone at the time of interpretation on 10/11/2014 at 12:39 pm to [facility] , who verbally acknowledged these results.   Electronically Signed   By: Kathreen Devoid   On: 10/11/2014 12:42    Micro Results      No results found for this or any previous visit (from the past 240 hour(s)).     Today    Subjective:   Shakevia Sarris today has no headache,no chest abdominal pain,no new weakness tingling or numbness, feels much better wants to go home today.    Objective:   Blood pressure 121/48, pulse 74, temperature 98.3 F (36.8 C), temperature source Oral, resp. rate 12, height 5' 4.5" (1.638 m), weight 99.338 kg (219 lb), SpO2 96 %.   Intake/Output Summary (Last 24 hours) at 10/13/14 0950 Last data filed at 10/13/14 0852  Gross per 24 hour  Intake    240 ml  Output      0 ml  Net    240 ml    Exam Awake Alert, Oriented x 3, No new F.N deficits, Normal affect [place].AT,PERRAL Supple Neck,No JVD, No cervical lymphadenopathy appriciated.  Symmetrical Chest wall movement, Good air movement bilaterally, CTAB RRR,No Gallops,Rubs or new Murmurs, No Parasternal Heave +ve B.Sounds, Abd Soft, Non tender, No organomegaly appriciated, No rebound -guarding or rigidity. No Cyanosis, Clubbing or edema, No new Rash or bruise, L knee looks stable  Data Review   CBC w Diff:  Lab Results  Component Value Date   WBC 8.3 10/12/2014   HGB 8.3* 10/13/2014   HCT 25.8* 10/13/2014   PLT 286 10/12/2014   LYMPHOPCT 17 10/11/2014   MONOPCT 9 10/11/2014   EOSPCT 1 10/11/2014   BASOPCT 0 10/11/2014    CMP:  Lab Results  Component Value Date   NA 134* 10/12/2014   K 3.8 10/12/2014   CL 96 10/12/2014   CO2 27 10/12/2014   BUN 9 10/12/2014   CREATININE 0.74 10/12/2014   PROT 6.4 10/11/2014   ALBUMIN 3.1* 10/11/2014   BILITOT 0.6 10/11/2014   ALKPHOS 86 10/11/2014   AST 28 10/11/2014   ALT 10 10/11/2014  .  Lab Results  Component Value Date   INR 1.31 10/12/2014   INR 1.79* 10/11/2014   INR 8.26* 10/11/2014    Total Time in preparing paper work, data evaluation and todays exam - 35 minutes  Thurnell Lose M.D on 10/13/2014 at 9:50 AM  [facility] Office  [phone]

## 2014-10-13 NOTE — Progress Notes (Signed)
Pt given discharge instructions and PIV removed.  Pt taken to discharge location via wheelchair.

## 2014-10-13 NOTE — Plan of Care (Signed)
Problem: Phase I Progression Outcomes Goal: Voiding-avoid urinary catheter unless indicated Outcome: Completed/Met Date Met:  10/13/14     

## 2014-10-13 NOTE — Plan of Care (Signed)
Problem: Phase I Progression Outcomes Goal: OOB as tolerated unless otherwise ordered Outcome: Completed/Met Date Met:  10/13/14

## 2014-10-13 NOTE — Discharge Instructions (Signed)
Follow with Primary MD Hayden Rasmussen., MD in 2 days   Get CBC, BMP, INR checked  by Primary MD next visit.    Activity: As tolerated with Full fall precautions use walker/cane & assistance as needed   Disposition Home     Diet: Heart Healthy  Low carb  For Heart failure patients - Check your Weight same time everyday, if you gain over 2 pounds, or you develop in leg swelling, experience more shortness of breath or chest pain, call your Primary MD immediately. Follow Cardiac Low Salt Diet and 1.8 lit/day fluid restriction.   On your next visit with your primary care physician please Get Medicines reviewed and adjusted.   Please request your Prim.MD to go over all Hospital Tests and Procedure/Radiological results at the follow up, please get all Hospital records sent to your Prim MD by signing hospital release before you go home.   If you experience worsening of your admission symptoms, develop shortness of breath, life threatening emergency, suicidal or homicidal thoughts you must seek medical attention immediately by calling 911 or calling your MD immediately  if symptoms less severe.  You Must read complete instructions/literature along with all the possible adverse reactions/side effects for all the Medicines you take and that have been prescribed to you. Take any new Medicines after you have completely understood and accpet all the possible adverse reactions/side effects.   Do not drive, operating heavy machinery, perform activities at heights, swimming or participation in water activities or provide baby sitting services if your were admitted for syncope or siezures until you have seen by Primary MD or a Neurologist and advised to do so again.  Do not drive when taking Pain medications.    Do not take more than prescribed Pain, Sleep and Anxiety Medications  Special Instructions: If you have smoked or chewed Tobacco  in the last 2 yrs please stop smoking, stop any regular  Alcohol  and or any Recreational drug use.  Wear Seat belts while driving.   Please note  You were cared for by a hospitalist during your hospital stay. If you have any questions about your discharge medications or the care you received while you were in the hospital after you are discharged, you can call the unit and asked to speak with the hospitalist on call if the hospitalist that took care of you is not available. Once you are discharged, your primary care physician will handle any further medical issues. Please note that NO REFILLS for any discharge medications will be authorized once you are discharged, as it is imperative that you return to your primary care physician (or establish a relationship with a primary care physician if you do not have one) for your aftercare needs so that they can reassess your need for medications and monitor your lab values.

## 2014-10-15 ENCOUNTER — Emergency Department (HOSPITAL_COMMUNITY)
Admission: EM | Admit: 2014-10-15 | Discharge: 2014-10-15 | Disposition: A | Discharge status: Home / Self Care | Payer: Medicare Other | Attending: Emergency Medicine | Admitting: Emergency Medicine

## 2014-10-15 ENCOUNTER — Encounter (HOSPITAL_COMMUNITY): Payer: Self-pay | Admitting: Vascular Surgery

## 2014-10-15 DIAGNOSIS — E119 Type 2 diabetes mellitus without complications: Secondary | ICD-10-CM | POA: Insufficient documentation

## 2014-10-15 DIAGNOSIS — E785 Hyperlipidemia, unspecified: Secondary | ICD-10-CM | POA: Insufficient documentation

## 2014-10-15 DIAGNOSIS — Z79899 Other long term (current) drug therapy: Secondary | ICD-10-CM | POA: Insufficient documentation

## 2014-10-15 DIAGNOSIS — M1990 Unspecified osteoarthritis, unspecified site: Secondary | ICD-10-CM | POA: Insufficient documentation

## 2014-10-15 DIAGNOSIS — I10 Essential (primary) hypertension: Secondary | ICD-10-CM | POA: Diagnosis not present

## 2014-10-15 DIAGNOSIS — R531 Weakness: Secondary | ICD-10-CM | POA: Insufficient documentation

## 2014-10-15 DIAGNOSIS — J45909 Unspecified asthma, uncomplicated: Secondary | ICD-10-CM | POA: Diagnosis not present

## 2014-10-15 DIAGNOSIS — R011 Cardiac murmur, unspecified: Secondary | ICD-10-CM | POA: Insufficient documentation

## 2014-10-15 DIAGNOSIS — G4733 Obstructive sleep apnea (adult) (pediatric): Secondary | ICD-10-CM | POA: Insufficient documentation

## 2014-10-15 DIAGNOSIS — F419 Anxiety disorder, unspecified: Secondary | ICD-10-CM | POA: Insufficient documentation

## 2014-10-15 DIAGNOSIS — K219 Gastro-esophageal reflux disease without esophagitis: Secondary | ICD-10-CM | POA: Insufficient documentation

## 2014-10-15 DIAGNOSIS — Z8619 Personal history of other infectious and parasitic diseases: Secondary | ICD-10-CM | POA: Diagnosis not present

## 2014-10-15 DIAGNOSIS — E039 Hypothyroidism, unspecified: Secondary | ICD-10-CM | POA: Diagnosis not present

## 2014-10-15 DIAGNOSIS — F329 Major depressive disorder, single episode, unspecified: Secondary | ICD-10-CM | POA: Diagnosis not present

## 2014-10-15 DIAGNOSIS — Z9889 Other specified postprocedural states: Secondary | ICD-10-CM | POA: Diagnosis not present

## 2014-10-15 DIAGNOSIS — R1030 Lower abdominal pain, unspecified: Secondary | ICD-10-CM | POA: Insufficient documentation

## 2014-10-15 DIAGNOSIS — R1032 Left lower quadrant pain: Secondary | ICD-10-CM

## 2014-10-15 DIAGNOSIS — Z8701 Personal history of pneumonia (recurrent): Secondary | ICD-10-CM | POA: Insufficient documentation

## 2014-10-15 DIAGNOSIS — Z9981 Dependence on supplemental oxygen: Secondary | ICD-10-CM | POA: Insufficient documentation

## 2014-10-15 LAB — URINE MICROSCOPIC-ADD ON

## 2014-10-15 LAB — CBC WITH DIFFERENTIAL/PLATELET
BASOS PCT: 0 % (ref 0–1)
Basophils Absolute: 0 10*3/uL (ref 0.0–0.1)
EOS ABS: 0.3 10*3/uL (ref 0.0–0.7)
EOS PCT: 3 % (ref 0–5)
HCT: 26.3 % — ABNORMAL LOW (ref 36.0–46.0)
Hemoglobin: 8.3 g/dL — ABNORMAL LOW (ref 12.0–15.0)
LYMPHS ABS: 2.1 10*3/uL (ref 0.7–4.0)
Lymphocytes Relative: 22 % (ref 12–46)
MCH: 26.7 pg (ref 26.0–34.0)
MCHC: 31.6 g/dL (ref 30.0–36.0)
MCV: 84.6 fL (ref 78.0–100.0)
Monocytes Absolute: 1 10*3/uL (ref 0.1–1.0)
Monocytes Relative: 11 % (ref 3–12)
NEUTROS PCT: 64 % (ref 43–77)
Neutro Abs: 6.1 10*3/uL (ref 1.7–7.7)
Platelets: 392 10*3/uL (ref 150–400)
RBC: 3.11 MIL/uL — AB (ref 3.87–5.11)
RDW: 13.5 % (ref 11.5–15.5)
WBC: 9.6 10*3/uL (ref 4.0–10.5)

## 2014-10-15 LAB — URINALYSIS, ROUTINE W REFLEX MICROSCOPIC
Bilirubin Urine: NEGATIVE
GLUCOSE, UA: NEGATIVE mg/dL
Hgb urine dipstick: NEGATIVE
Ketones, ur: NEGATIVE mg/dL
Nitrite: NEGATIVE
PH: 7 (ref 5.0–8.0)
Protein, ur: NEGATIVE mg/dL
Specific Gravity, Urine: 1.021 (ref 1.005–1.030)
Urobilinogen, UA: 0.2 mg/dL (ref 0.0–1.0)

## 2014-10-15 LAB — BASIC METABOLIC PANEL
Anion gap: 12 (ref 5–15)
BUN: 10 mg/dL (ref 6–23)
CALCIUM: 9.3 mg/dL (ref 8.4–10.5)
CO2: 27 mEq/L (ref 19–32)
Chloride: 94 mEq/L — ABNORMAL LOW (ref 96–112)
Creatinine, Ser: 0.81 mg/dL (ref 0.50–1.10)
GFR, EST AFRICAN AMERICAN: 84 mL/min — AB (ref >90)
GFR, EST NON AFRICAN AMERICAN: 72 mL/min — AB (ref >90)
GLUCOSE: 81 mg/dL (ref 70–99)
Potassium: 3.5 mEq/L — ABNORMAL LOW (ref 3.7–5.3)
Sodium: 133 mEq/L — ABNORMAL LOW (ref 137–147)

## 2014-10-15 LAB — I-STAT TROPONIN, ED: Troponin i, poc: 0 ng/mL (ref 0.00–0.08)

## 2014-10-15 LAB — PROTIME-INR
INR: 1.04 (ref 0.00–1.49)
Prothrombin Time: 13.7 seconds (ref 11.6–15.2)

## 2014-10-15 NOTE — Discharge Instructions (Signed)
We saw you in the ER for the groin pain. All the results in the ER are normal, labs and imaging. We are not sure what is causing your symptoms. The workup in the ER is not complete, and is limited to screening for life threatening and emergent conditions only, so please see a primary care doctor for further evaluation.  Hematoma A hematoma is a collection of blood. The collection of blood can turn into a hard, painful lump under the skin. Your skin may turn blue or yellow if the hematoma is close to the surface of the skin. Most hematomas get better in a few days to weeks. Some hematomas are serious and need medical care. Hematomas can be very small or very big. HOME CARE  Apply ice to the injured area:  Put ice in a plastic bag.  Place a towel between your skin and the bag.  Leave the ice on for 20 minutes, 2-3 times a day for the first 1 to 2 days.  After the first 2 days, switch to using warm packs on the injured area.  Raise (elevate) the injured area to lessen pain and puffiness (swelling). You may also wrap the area with an elastic bandage. Make sure the bandage is not wrapped too tight.  If you have a painful hematoma on your leg or foot, you may use crutches for a couple days.  Only take medicines as told by your doctor. GET HELP RIGHT AWAY IF:   Your pain gets worse.  Your pain is not controlled with medicine.  You have a fever.  Your puffiness gets worse.  Your skin turns more blue or yellow.  Your skin over the hematoma breaks or starts bleeding.  Your hematoma is in your chest or belly (abdomen) and you are short of breath, feel weak, or have a change in consciousness.  Your hematoma is on your scalp and you have a headache that gets worse or a change in alertness or consciousness. MAKE SURE YOU:   Understand these instructions.  Will watch your condition.  Will get help right away if you are not doing well or get worse. Document Released: 12/11/2004  Document Revised: 07/06/2013 Document Reviewed: 04/13/2013 [facility] Patient Information 2015 [place], [place]. This information is not intended to replace advice given to you by your health care provider. Make sure you discuss any questions you have with your health care provider.

## 2014-10-15 NOTE — ED Notes (Signed)
Pt reports to the ED for eval of right groin pain. Pt reports she was recently admitted for similar. She states she had right knee surgery and she was placed on Coumadin and she was seen and her levels were very high. They referred her here and they found blood in her left lower abdomen and groin. She also reports her Hb was low at that time. At discharge her levels had normalized however, today she developed generalized weakness, groin pain, and symptoms similar to those she had last week. Pt A&Ox4, resp e/u, and skin warm, pale, and dry. She also reports diarrhea. She has a hx of IBS and she reports this is a little worse. Denies any blood in her stool.

## 2014-10-15 NOTE — ED Provider Notes (Signed)
CSN: [id number]     Arrival date & time 10/15/14  1745 History   First MD Initiated Contact with Patient 10/15/14 1824     Chief Complaint  Patient presents with  . Groin Pain     (Consider location/radiation/quality/duration/timing/severity/associated sxs/prior Treatment) HPI Comments: Pt comes in with cc of Groin pain, and generalized weakness. Hx of DM, HL, and recent knee replacement surgery, for which she was on coumadin. Pt developed a hemorrhage of the L iliopsoas muscle and supratherapeutic INR, and discharged home once that normalized. Pt continues to have groin pain and it's constant. There is no specific aggravating or relieving factor, no bruising and no redness, warmth to touch. Pt also has generalized weakness and feels less motivated. Pt started crying at this tine, stating that she has been under tremendous stress taking care of her demented husband alone until recently, and with her knee replacement and such. Pt denies nausea, emesis, fevers, chills, chest pains, shortness of breath, headaches, abdominal pain, uti like symptoms.   Patient is a 68 y.o. female presenting with groin pain. The history is provided by the patient.  Groin Pain Pertinent negatives include no chest pain, no abdominal pain, no headaches and no shortness of breath.    Past Medical History  Diagnosis Date  . Complication of anesthesia     "forgets to breathe" when she wakes up  . Vertigo   . Heart murmur   . Mitral valve prolapse   . Asthma     seasonal or with bronchitis  . Pneumonia     hx of  . Hypothyroidism   . Anxiety   . Headache   . Arthritis   . Hepatitis     A -- as teenager  . Hypertension   . GERD (gastroesophageal reflux disease)   . Diabetes type 2, controlled     fasting 100-120  . Depression   . Hyperlipidemia   . OSA on CPAP    Past Surgical History  Procedure Laterality Date  . Gallbladder surgery  1995  . Nasal sinus surgery  2005  . Throat surgery  2005  .  Partial hysterectomy  1983  . Cholecystectomy    . Abdominal hysterectomy    . Joint replacement Left     knee  . Fracture surgery Right     leg  . 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 Cancer Mother     panceratic  . Cancer - Colon Mother    History  Substance Use Topics  . Smoking status: Never Smoker   . Smokeless tobacco: Never Used  . Alcohol Use: No     Comment: socially   OB History    No data available     Review of Systems  Constitutional: Positive for activity change and fatigue.  Respiratory: Negative for shortness of breath.   Cardiovascular: Negative for chest pain.  Gastrointestinal: Negative for nausea, vomiting and abdominal pain.  Genitourinary: Negative for dysuria.  Musculoskeletal: Positive for myalgias. Negative for neck pain.  Skin: Negative for rash and wound.  Allergic/Immunologic: Negative for immunocompromised state.  Neurological: Positive for weakness. Negative for headaches.  All other systems reviewed and are negative.     Allergies  Aciphex; Erythromycin; Macrodantin; Nsaids; Omeprazole-sodium bicarbonate; Sulfa antibiotics; and Tape  Home Medications  Prior to Admission medications   Medication Sig Start Date End Date Taking? Authorizing Provider  buPROPion (WELLBUTRIN XL) 300 MG 24 hr tablet Take 300 mg by mouth daily.     Yes Historical Provider, MD  chlorpheniramine (CHLOR-TRIMETON) 4 MG tablet Take 4 mg by mouth at bedtime as needed for allergies.    Yes Historical Provider, MD  Cholecalciferol (VITAMIN D3) 1000 UNITS CAPS Take 1,000 Units by mouth daily.    Yes Historical Provider, MD  clonazePAM (KLONOPIN) 0.5 MG tablet Take 1 tablet (0.5 mg total) by mouth daily. Patient taking differently: Take 0.5 mg by mouth 2 (two) times daily as needed  for anxiety.  09/14/14  Yes Meredith Pel, MD  hydrochlorothiazide (MICROZIDE) 12.5 MG capsule Take 12.5 mg by mouth daily.     Yes Historical Provider, MD  HYDROcodone-acetaminophen (NORCO/VICODIN) 5-325 MG per tablet Take 1 tablet by mouth every 6 (six) hours as needed for moderate pain.   Yes Historical Provider, MD  levothyroxine (SYNTHROID, LEVOTHROID) 25 MCG tablet Take 25 mcg by mouth daily.     Yes Historical Provider, MD  losartan (COZAAR) 25 MG tablet Take 12.5 mg by mouth daily.    Yes Historical Provider, MD  metFORMIN (GLUCOPHAGE) 500 MG tablet Take 1 tablet (500 mg total) by mouth 2 (two) times daily with a meal. 10/14/14  Yes Thurnell Lose, MD  methocarbamol (ROBAXIN) 500 MG tablet Take 1 tablet (500 mg total) by mouth every 6 (six) hours as needed for muscle spasms. 09/14/14  Yes Meredith Pel, MD  metoprolol (LOPRESSOR) 50 MG tablet Take 25 mg by mouth 2 (two) times daily.     Yes Historical Provider, MD  omeprazole (PRILOSEC) 40 MG capsule Take 40 mg by mouth daily.     Yes Historical Provider, MD  ondansetron (ZOFRAN) 4 MG tablet Take 4 mg by mouth every 8 (eight) hours as needed for nausea or vomiting.   Yes Historical Provider, MD  OVER THE COUNTER MEDICATION Place 1 drop into both eyes 4 (four) times daily as needed (for dry eyes). [place] Drop   Yes Historical Provider, MD  oxyCODONE (OXY IR/ROXICODONE) 5 MG immediate release tablet Take 1 tablet (5 mg total) by mouth every 4 (four) hours as needed for breakthrough pain. 09/14/14  Yes Meredith Pel, MD  Propylene Glycol (SYSTANE BALANCE) 0.6 % SOLN Apply 1 drop to eye every 6 (six) hours as needed.    Yes Historical Provider, MD  simvastatin (ZOCOR) 10 MG tablet Take 10 mg by mouth at bedtime.     Yes Historical Provider, MD  traZODone (DESYREL) 100 MG tablet Take 100-200 mg by mouth at bedtime. Most of the time takes 100 mg   Yes Historical Provider, MD  venlafaxine (EFFEXOR) 50 MG tablet Take  100 mg by mouth daily.   Yes Historical Provider, MD  White Petrolatum-Mineral Oil (PURALUBE) 85-15 % OINT Apply 1 application to eye daily as needed.    Yes Historical Provider, MD  albuterol (PROVENTIL HFA;VENTOLIN HFA) 108 (90 BASE) MCG/ACT inhaler Inhale 1 puff into the lungs every 6 (six) hours as needed for wheezing or shortness of breath.    Historical Provider, MD   BP 115/46 mmHg  Pulse 65  Resp 19  SpO2 99% Physical Exam  Constitutional: She is oriented to person, place, and time. She appears well-developed and well-nourished.  HENT:  Head: Normocephalic and atraumatic.  Eyes: EOM are normal. Pupils are equal, round, and reactive to light.  Neck: Neck supple.  Cardiovascular: Normal rate, regular rhythm and normal heart sounds.   No murmur heard. Pulmonary/Chest: Effort normal. No respiratory distress.  Abdominal: Soft. She exhibits no distension. There is no tenderness. There is no rebound and no guarding.  Musculoskeletal:  L groin - there is no noticeable ecchymoses, edema, callor and pain is not reproducible with palpation or with hip ROM. LLE strength is 4+/5.  Neurological: She is alert and oriented to person, place, and time.  Skin: Skin is warm and dry.  Nursing note and vitals reviewed.   ED Course  Procedures (including critical care time) Labs Review Labs Reviewed  CBC WITH DIFFERENTIAL - Abnormal; Notable for the following:    RBC 3.11 (*)    Hemoglobin 8.3 (*)    HCT 26.3 (*)    All other components within normal limits  BASIC METABOLIC PANEL - Abnormal; Notable for the following:    Sodium 133 (*)    Potassium 3.5 (*)    Chloride 94 (*)    GFR calc non Af Amer 72 (*)    GFR calc Af Amer 84 (*)    All other components within normal limits  URINALYSIS, ROUTINE W REFLEX MICROSCOPIC - Abnormal; Notable for the following:    APPearance CLOUDY (*)    Leukocytes, UA SMALL (*)    All other components within normal limits  URINE MICROSCOPIC-ADD ON -  Abnormal; Notable for the following:    Squamous Epithelial / LPF MANY (*)    Bacteria, UA FEW (*)    Casts HYALINE CASTS (*)    All other components within normal limits  PROTIME-INR  I-STAT TROPOININ, ED    Imaging Review No results found.   EKG Interpretation   Date/Time:  Sunday October 15 2014 19:38:59 EST Ventricular Rate:  63 PR Interval:  170 QRS Duration: 91 QT Interval:  440 QTC Calculation: 450 R Axis:   77 Text Interpretation:  Sinus rhythm Low voltage, precordial leads unchanged  q wave Confirmed by Dearis Danis, MD, Joscelin Fray [phone]) on 10/15/2014 9:38:52 PM      MDM   Final diagnoses:  Groin pain, left  Generalized weakness   Pt comes in with cc of weakness. ROS is neg for any source of infection. The exam is neg as well. Labs reassuring. PT's groin exam is neg. Suspect that the pain is from the residual hematoma. Will d.c.   Varney Biles, MD 10/15/14 2355

## 2014-12-22 ENCOUNTER — Other Ambulatory Visit: Payer: Self-pay | Admitting: Gastroenterology

## 2014-12-22 DIAGNOSIS — R1011 Right upper quadrant pain: Secondary | ICD-10-CM

## 2014-12-27 ENCOUNTER — Ambulatory Visit
Admission: RE | Admit: 2014-12-27 | Discharge: 2014-12-27 | Disposition: A | Discharge status: Home / Self Care | Payer: Medicare Other | Source: Ambulatory Visit | Attending: Gastroenterology | Admitting: Gastroenterology

## 2014-12-27 DIAGNOSIS — R1011 Right upper quadrant pain: Secondary | ICD-10-CM

## 2015-06-13 ENCOUNTER — Ambulatory Visit: Payer: Medicare Other | Attending: Family Medicine | Admitting: Physical Therapy

## 2015-06-13 ENCOUNTER — Encounter: Payer: Self-pay | Admitting: Physical Therapy

## 2015-06-13 DIAGNOSIS — R531 Weakness: Secondary | ICD-10-CM

## 2015-06-13 DIAGNOSIS — R2681 Unsteadiness on feet: Secondary | ICD-10-CM

## 2015-06-13 DIAGNOSIS — R269 Unspecified abnormalities of gait and mobility: Secondary | ICD-10-CM | POA: Insufficient documentation

## 2015-06-13 NOTE — Therapy (Signed)
[facility] [address] [place], [place], [address] Phone: [phone]   Fax:  [phone]  Physical Therapy Evaluation  Patient Details  Name: Amanda [name] MRN: [id number] Date of Birth: [birth year] Referring Provider:  Katherina Mires, MD  Encounter Date: 06/13/2015      PT End of Session - 06/13/15 2118    Visit Number 1   Number of Visits 9  eval + 8 visits   Date for PT Re-Evaluation 08/12/15   Authorization Type Medicare - G Codes   PT Start Time 1401   PT Stop Time 6962   PT Time Calculation (min) 46 min   Equipment Utilized During Treatment Gait belt   Activity Tolerance Patient tolerated treatment well   Behavior During Therapy [facility] for tasks assessed/performed      Past Medical History  Diagnosis Date  . Complication of anesthesia     "forgets to breathe" when she wakes up  . Vertigo   . Heart murmur   . Mitral valve prolapse   . Asthma     seasonal or with bronchitis  . Pneumonia     hx of  . Hypothyroidism   . Anxiety   . Headache   . Arthritis   . Hepatitis     A -- as teenager  . Hypertension   . GERD (gastroesophageal reflux disease)   . Diabetes type 2, controlled     fasting 100-120  . Depression   . Hyperlipidemia   . OSA on CPAP     Past Surgical History  Procedure Laterality Date  . Gallbladder surgery  1995  . Nasal sinus surgery  2005  . Throat surgery  2005  . Partial hysterectomy  1983  . Cholecystectomy    . Abdominal hysterectomy    . Joint replacement Left     knee  . Fracture surgery Right     leg  . Total knee arthroplasty Right 09/12/2014    Procedure: TOTAL KNEE ARTHROPLASTY;  Surgeon: Meredith Pel, MD;  Location: [place];  Service: Orthopedics;  Laterality: Right;    There were no vitals filed for this visit.  Visit Diagnosis:  Abnormality of gait - Plan: PT plan of care cert/re-cert  Weakness generalized - Plan: PT plan of care  cert/re-cert  Unsteadiness - Plan: PT plan of care cert/re-cert      Subjective Assessment - 06/13/15 1407    Subjective "I have a very unsteady gait and I fall a lot. It's very scary because I live alone. 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 My feet always rolled inward when I was a child. I fell 3 times in one week, but I haven't fallen since." Pt has fallen down the stairs and has tripped over rugs. Pt reports feeling dizzy "at times". Pt attributes falls to "my legs getting all tangled up in my cane". Pt states, "I can't go for a walk because I'm afraid to go too far by myself, I need 2 railings in order to go up and down the stairs; I have trouble going up and down a curb or sidewalk. When I'm sweeping or vacuuming, I have to hold onto the countertop with one hand." Pt also reports difficulty standing fromlow chairs.   Pertinent History goes by "[name]"; anxiety, depression, DM, thyroid disease, GERD, hyperlipidemia, vertigo, HTN, B TKE (L in 2005;  in 2015); B plantar fasciitis   Limitations Walking;House hold activities   Patient Stated Goals "To  try to get back to a normal gait and to conquer the fear of going out."   Currently in Pain? Yes   Pain Score 3    Pain Location Generalized  "Everyting hurts"   Pain Descriptors / Indicators Aching   Pain Type Chronic pain   Pain Onset More than a month ago  "years and years"   Pain Frequency Constant   Aggravating Factors  "being tired"   Pain Relieving Factors "nothing"   Effect of Pain on Daily Activities "I've got the dirtiest house in [place] because I have a hard time cleaning and bring laundry up and down the stairs"            [facility] PT Assessment - 06/13/15 0001    Assessment   Medical Diagnosis gait instability; falls   Onset Date/Surgical Date 09/17/14   Prior Therapy None in 2016   Precautions   Precautions Fall   Restrictions   Weight Bearing Restrictions No   Balance Screen   Has the patient fallen in the past  6 months Yes   How many times? 7   Has the patient had a decrease in activity level because of a fear of falling?  Yes   Is the patient reluctant to leave their home because of a fear of falling?  Yes   Independence Private residence   [place] to enter   Entrance Stairs-Number of Steps 2  small steps   Entrance Stairs-Rails None  pt holds onto post   [place] - single point   Prior Function   Level of Independence Independent;Independent with gait;Independent with transfers   Vocation Retired   [employer]   Overall Cognitive Status Within Functional Limits for tasks assessed   Sensation   Light Touch Appears Intact   Coordination   Gross Motor Movements are Fluid and Coordinated Yes   ROM / Strength   AROM / PROM / Strength Strength   Strength   Overall Strength Deficits   Overall Strength Comments R knee flexion 4-/5;  4-/5 L hip ABD, 4/5 L hip extension. 4-/5 R hip extension/ABD   Transfers   Transfers Sit to Stand;Stand to Sit   Sit to Stand 6: Modified independent (Device/Increase time)   Five time sit to stand comments  17.84 sec without UE use   Stand to Sit 6: Modified independent (Device/Increase time)   Ambulation/Gait   Ambulation/Gait Yes   Ambulation/Gait Assistance 5: Supervision   Ambulation/Gait Assistance Details R toe catch x1   Assistive device None   Gait Pattern Step-through pattern;Decreased stride length;Decreased hip/knee flexion - right;Decreased hip/knee flexion - left;Decreased dorsiflexion - right;Decreased dorsiflexion - left;Decreased trunk rotation;Poor foot clearance - right  minimal heel lift during terminal stance bilat   Ambulation Surface Level;Indoor   Gait velocity 2.57 ft/sec   Balance   Balance Assessed Yes   Static Standing Balance   Static Standing - Balance Support No upper extremity supported   Static Standing - Level of Assistance  4: Min assist;5: Stand by assistance   Static Standing Balance -  Activities  Single Leg Stance - Right Leg;Single Leg Stance - Left Leg   Static Standing - Comment/# of Minutes <5 seconds bilaterally                         PT Education - 06/13/15 1417  Education provided Yes   Education Details PT evaluation findings, goals, and POC.   Person(s) Educated Patient   Methods Explanation   Comprehension Verbalized understanding          PT Short Term Goals - 06/13/15 2132    PT SHORT TERM GOAL #1   Title Pt will independently perform home exercises to indicate safe daily compliance with HEP. Target date: 07/11/15   Status New   PT SHORT TERM GOAL #2   Title Pt will ambulate at self-selected gait speed of 2.63 ft/sec to indicate status of community ambulator. Target date: 07/11/15   Status New   PT SHORT TERM GOAL #3   Title Pt will perform 5 Times Sit-to-Stand Test in <15 seconds without UE support to indicate decreased risk of recurrent falls. Target date: 07/11/15   Status New   PT SHORT TERM GOAL #4   Title Perform DGI to assess gait stability in presence of external demands. Target date: 07/11/15   Status New           PT Long Term Goals - 06/13/15 2134    PT LONG TERM GOAL #1   Title Pt will verbalize understanding of fall prevention strategies to decrease risk of falls within home. Target date: 07/08/15   Status New   PT LONG TERM GOAL #2   Title Pt will score 20/24 on Dynamic Gait Index to indicate decreased fall risk, increased gait stability in presence of external demands. Target date: 07/08/15   Status New   PT LONG TERM GOAL #3   Title Pt will independently negotiate standard curb step with no overt LOB to indicate safety with community obstacle negotiation. Target date: 07/08/15   Status New   PT LONG TERM GOAL #4   Title Pt will negotiate 12 stairs with 1 rail and mod I to indicate ability to safely access second floor of home. Target date:  07/08/15   Status New   PT LONG TERM GOAL #5   Title Pt will participate in entire physical therapy session with </= 2-point increase in pain rating to indicate decreased effect of pain on pt participation in functional mobility. Target date: 07/08/15   Status New   Additional Long Term Goals   Additional Long Term Goals Yes   PT LONG TERM GOAL #6   Title Pt will ambulate 300' over unlevel asphalt surfaces with mod I using LRAD to indicate safety with limited community ambulation. Target date: 07/08/15   Status New               Plan - 06/13/15 2119    Clinical Impression Statement Pt is a 69 y/o F presenting to outpatient PT due to gait instability and frequent falls. PT evaluation reveals the following impairments: gait impairments; decreased functional LE strength, as exhibited by 5x Sit-to-Stand Test; gait speed indicative of status as limited community ambulator; dizziness; generalized pain; and frequent falls. Recommending skilled outpatient PT 2x/week for 8 weeks to address said impairments; however, pt requesting 1x/week for 8 weeks at this time secondary to financial limitatons, high insurance co-pay.   Pt will benefit from skilled therapeutic intervention in order to improve on the following deficits Pain;Abnormal gait;Postural dysfunction;Decreased activity tolerance;Decreased balance;Dizziness;Decreased mobility;Decreased strength   Rehab Potential Good   Clinical Impairments Affecting Rehab Potential financial limitations (high insurance co-pay)   PT Frequency 1x / week  Recommending 2x/week; however, pt requesting 1x/week due to high insurance co-pay   PT Duration 8 weeks  PT Treatment/Interventions Stair training;Functional mobility training;Therapeutic activities;Therapeutic exercise;Balance training;Neuromuscular re-education;Gait training;DME Instruction;Vestibular;Patient/family education;Orthotic Fit/Training;Cryotherapy;ADLs/Self Care Home Management   PT Next Visit  Plan Perform DGI; establish HEP. Confirm number of stairs/rails to access 2nd floor of home. Fall prevention strategies.   PT Home Exercise Plan Strengthening of B hips and R knee; functional strengthening (sit <> stand without UE support); corner balance exercises, if appropriate   Consulted and Agree with Plan of Care Patient          G-Codes - 06-30-2015 [id number]    Functional Assessment Tool Used 5 Times Sit-to-Stand Test 17.84 sec; gait speed 2.57 ft/sec   Functional Limitation Mobility: Walking and moving around   Mobility: Walking and Moving Around Current Status [phone]) At least 20 percent but less than 40 percent impaired, limited or restricted   Mobility: Walking and Moving Around Goal Status [phone]) At least 1 percent but less than 20 percent impaired, limited or restricted       Problem List Patient Active Problem List   Diagnosis Date Noted  . Abnormal CT scan   . Hematoma of left psoas region to anticoagulant therapy 10/11/2014  . Coagulopathy 10/11/2014  . Psoas hematoma, left, secondary to anticoagulant therapy 10/11/2014  . Acute hyponatremia 10/11/2014  . S/P total knee arthroplasty 09/24/2014  . Hypertension   . GERD (gastroesophageal reflux disease)   . Diabetes type 2, controlled   . Depression   . Hyperlipidemia   . 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 Cough 08/31/2013    Billie Ruddy, PT, DPT [facility] [address] [place], [place], [address] Phone: [phone]   Fax:  [phone] 06-30-2015, 9:59 PM

## 2015-06-20 ENCOUNTER — Ambulatory Visit: Payer: Medicare Other | Attending: Family Medicine | Admitting: Physical Therapy

## 2015-06-20 DIAGNOSIS — R269 Unspecified abnormalities of gait and mobility: Secondary | ICD-10-CM | POA: Diagnosis not present

## 2015-06-20 DIAGNOSIS — R42 Dizziness and giddiness: Secondary | ICD-10-CM | POA: Diagnosis present

## 2015-06-20 DIAGNOSIS — R531 Weakness: Secondary | ICD-10-CM | POA: Insufficient documentation

## 2015-06-20 DIAGNOSIS — R2681 Unsteadiness on feet: Secondary | ICD-10-CM | POA: Diagnosis present

## 2015-06-20 NOTE — Patient Instructions (Signed)
Functional Quadriceps: Sit to Stand   Sit on edge of chair, feet flat on floor. Stand upright, extending knees fully. Repeat __10__ times per set. Do _1___ sets per session. Do _1-2___ sessions per day.  http://orth.exer.us/735   Copyright  VHI. All rights reserved.  "I love a Art therapist or stable surface for balance: High knee marching with 3 second pauses with each lift while walking forward and then backwards.  Repeat 3 laps each way. Do __1-2__ sessions per day.  http://gt2.exer.us/345   Copyright  VHI. All rights reserved.  Walking on Heels   At counter or stable surface: Walk on heels forward while continuing on a straight path and then backward to start position. 3 laps each way. Do __1-2 sessions per day.  Copyright  VHI. All rights reserved.  Walking on Toes   At counter or stable surface: Walk on toes forward while continuing on a straight path and then backwards to start position. 3 laps each way.  Do _1-2___sessions per day.  Copyright  VHI. All rights reserved.  Feet Heel-Toe "Tandem"   At counter or stable surface: Arms at sides/on counter or surface, walk a straight line bringing one foot directly in front of the other forward, and then backwards.  3 laps each way. Do _1-2___ sessions per day.  Copyright  VHI. All rights reserved.

## 2015-06-21 NOTE — Therapy (Signed)
[facility] [address] [place], [place], [address] Phone: [phone]   Fax:  [phone]  Physical Therapy Treatment  Patient Details  Name: Amanda [name] MRN: [id number] Date of Birth: [birth year] Referring Provider:  Saralyn Pilar, MD  Encounter Date: 06/20/2015      PT End of Session - 06/20/15 1544    Visit Number 2   Number of Visits 9  eval + 8 visits   Date for PT Re-Evaluation 08/12/15   Authorization Type Medicare - G Codes   PT Start Time 6962   PT Stop Time 1615   PT Time Calculation (min) 41 min   Equipment Utilized During Treatment Gait belt   Activity Tolerance Patient tolerated treatment well   Behavior During Therapy [facility] for tasks assessed/performed      Past Medical History  Diagnosis Date  . Complication of anesthesia     "forgets to breathe" when she wakes up  . Vertigo   . Heart murmur   . Mitral valve prolapse   . Asthma     seasonal or with bronchitis  . Pneumonia     hx of  . Hypothyroidism   . Anxiety   . Headache   . Arthritis   . Hepatitis     A -- as teenager  . Hypertension   . GERD (gastroesophageal reflux disease)   . Diabetes type 2, controlled     fasting 100-120  . Depression   . Hyperlipidemia   . OSA on CPAP     Past Surgical History  Procedure Laterality Date  . Gallbladder surgery  1995  . Nasal sinus surgery  2005  . Throat surgery  2005  . Partial hysterectomy  1983  . Cholecystectomy    . Abdominal hysterectomy    . Joint replacement Left     knee  . Fracture surgery Right     leg  . Total knee arthroplasty Right 09/12/2014    Procedure: TOTAL KNEE ARTHROPLASTY;  Surgeon: Meredith Pel, MD;  Location: [facility];  Service: Orthopedics;  Laterality: Right;    There were no vitals filed for this visit.  Visit Diagnosis:  Abnormality of gait  Weakness generalized  Unsteadiness      Subjective Assessment - 06/20/15 1537     Subjective Pt has been wearing her sneakers all week and feels more off balance, "like i have vertigo". Sneakers have about a 1.5 to 2 inch sole on them. Pt reports no falls, several near falls with wearing them.    Currently in Pain? Yes   Pain Score 2    Pain Location Knee   Pain Orientation Right   Pain Descriptors / Indicators Sore   Pain Type Chronic pain   Pain Onset More than a month ago   Pain Frequency Intermittent   Aggravating Factors  increased activity   Pain Relieving Factors rest, tylenol,            [facility] Adult PT Treatment/Exercise - 06/20/15 1546    Dynamic Gait Index   Level Surface Normal   Change in Gait Speed Normal   Gait with Horizontal Head Turns Mild Impairment   Gait with Vertical Head Turns Mild Impairment   Gait and Pivot Turn Mild Impairment   Step Over Obstacle Moderate Impairment   Step Around Obstacles Severe Impairment   Steps Mild Impairment   Total Score 15     Exercises: issued HEP for strengthening and balance. Refer to  pt ed section for full details.  Self care: discussed proper shoe wear for fall prevention and stability with mobility. Gave pt information on [employer] to have her gait/feet assessed and then be advised on the best shoe for her.         PT Education - 06/20/15 1612    Education provided Yes   Education Details Shoe wear/support; HEP: sit<>stands; at counter: high knee marches, toe/heel walk, tandem walk fwd/bwd.   Person(s) Educated Patient   Methods Explanation;Demonstration;Handout   Comprehension Verbalized understanding          PT Short Term Goals - 06/20/15 1556    PT SHORT TERM GOAL #1   Title Pt will independently perform home exercises to indicate safe daily compliance with HEP. Target date: 07/11/15   Status New   PT SHORT TERM GOAL #2   Title Pt will ambulate at self-selected gait speed of 2.63 ft/sec to indicate status of community ambulator. Target date: 07/11/15   Status New   PT SHORT TERM  GOAL #3   Title Pt will perform 5 Times Sit-to-Stand Test in <15 seconds without UE support to indicate decreased risk of recurrent falls. Target date: 07/11/15   Status New   PT SHORT TERM GOAL #4   Title Perform DGI to assess gait stability in presence of external demands. Target date: 07/11/15   Baseline 06/20/15: baseline established with score of 15/24   Status Achieved           PT Long Term Goals - 06/13/15 2134    PT LONG TERM GOAL #1   Title Pt will verbalize understanding of fall prevention strategies to decrease risk of falls within home. Target date: 07/08/15   Status New   PT LONG TERM GOAL #2   Title Pt will score 20/24 on Dynamic Gait Index to indicate decreased fall risk, increased gait stability in presence of external demands. Target date: 07/08/15   Status New   PT LONG TERM GOAL #3   Title Pt will independently negotiate standard curb step with no overt LOB to indicate safety with community obstacle negotiation. Target date: 07/08/15   Status New   PT LONG TERM GOAL #4   Title Pt will negotiate 12 stairs with 1 rail and mod I to indicate ability to safely access second floor of home. Target date: 07/08/15   Status New   PT LONG TERM GOAL #5   Title Pt will participate in entire physical therapy session with </= 2-point increase in pain rating to indicate decreased effect of pain on pt participation in functional mobility. Target date: 07/08/15   Status New   Additional Long Term Goals   Additional Long Term Goals Yes   PT LONG TERM GOAL #6   Title Pt will ambulate 300' over unlevel asphalt surfaces with mod I using LRAD to indicate safety with limited community ambulation. Target date: 07/08/15   Status New            Plan - 06/20/15 1544    Clinical Impression Statement Dynamic gait Index completed today to establish baseline. Issued HEP as well without any issues reported. Pt making progress toward goals. Confirmed with pt she has 14 stairs with 2 rails and 2  triangular landing between them, all of the stairs are different height's.      Pt will benefit from skilled therapeutic intervention in order to improve on the following deficits Pain;Abnormal gait;Postural dysfunction;Decreased activity tolerance;Decreased balance;Dizziness;Decreased mobility;Decreased strength   Rehab  Potential Good   Clinical Impairments Affecting Rehab Potential financial limitations (high insurance co-pay)   PT Frequency 1x / week  Recommending 2x/week; however, pt requesting 1x/week due to high insurance co-pay   PT Duration 8 weeks   PT Treatment/Interventions Stair training;Functional mobility training;Therapeutic activities;Therapeutic exercise;Balance training;Neuromuscular re-education;Gait training;DME Instruction;Vestibular;Patient/family education;Orthotic Fit/Training;Cryotherapy;ADLs/Self Care Home Management   PT Next Visit Plan Educate pt on fall prevention strategies. Continue working on strengthening and balance activities.   PT Home Exercise Plan HEP issued on 06/20/15.   Consulted and Agree with Plan of Care Patient        Problem List Patient Active Problem List   Diagnosis Date Noted  . Abnormal CT scan   . Hematoma of left psoas region to anticoagulant therapy 10/11/2014  . Coagulopathy 10/11/2014  . Psoas hematoma, left, secondary to anticoagulant therapy 10/11/2014  . Acute hyponatremia 10/11/2014  . S/P total knee arthroplasty 09/24/2014  . Hypertension   . GERD (gastroesophageal reflux disease)   . Diabetes type 2, controlled   . Depression   . Hyperlipidemia   . 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 Cough 08/31/2013    Willow Ora 06/21/2015, 8:34 PM  Willow Ora, PTA, [facility] [address] [place], [place] [address] [phone] 06/21/2015, 8:34 PM

## 2015-06-27 ENCOUNTER — Ambulatory Visit: Payer: Medicare Other | Admitting: Physical Therapy

## 2015-06-27 DIAGNOSIS — R269 Unspecified abnormalities of gait and mobility: Secondary | ICD-10-CM | POA: Diagnosis not present

## 2015-06-27 DIAGNOSIS — R531 Weakness: Secondary | ICD-10-CM

## 2015-06-27 DIAGNOSIS — R42 Dizziness and giddiness: Secondary | ICD-10-CM

## 2015-06-27 DIAGNOSIS — R2681 Unsteadiness on feet: Secondary | ICD-10-CM

## 2015-06-27 NOTE — Therapy (Signed)
[facility] [address] [place], [place], [address] Phone: [phone]   Fax:  [phone]  Physical Therapy Treatment  Patient Details  Name: Amanda [name] MRN: [id number] Date of Birth: 02-09-1946 Referring Provider:  Saralyn Pilar, MD  Encounter Date: 06/27/2015      PT End of Session - 06/27/15 1647    Visit Number 3   Number of Visits 9   Date for PT Re-Evaluation 08/12/15   Authorization Type Medicare - G Codes   PT Start Time 6761   PT Stop Time 1620   PT Time Calculation (min) 42 min   Equipment Utilized During Treatment Gait belt   Behavior During Therapy [facility] for tasks assessed/performed      Past Medical History  Diagnosis Date  . Complication of anesthesia     "forgets to breathe" when she wakes up  . Vertigo   . Heart murmur   . Mitral valve prolapse   . Asthma     seasonal or with bronchitis  . Pneumonia     hx of  . Hypothyroidism   . Anxiety   . Headache   . Arthritis   . Hepatitis     A -- as teenager  . Hypertension   . GERD (gastroesophageal reflux disease)   . Diabetes type 2, controlled     fasting 100-120  . Depression   . Hyperlipidemia   . OSA on CPAP     Past Surgical History  Procedure Laterality Date  . Gallbladder surgery  1995  . Nasal sinus surgery  2005  . Throat surgery  2005  . Partial hysterectomy  1983  . Cholecystectomy    . Abdominal hysterectomy    . Joint replacement Left     knee  . Fracture surgery Right     leg  . Total knee arthroplasty Right 09/12/2014    Procedure: TOTAL KNEE ARTHROPLASTY;  Surgeon: Meredith Pel, MD;  Location: [place];  Service: Orthopedics;  Laterality: Right;    There were no vitals filed for this visit.  Visit Diagnosis:  Abnormality of gait - Plan: PT plan of care cert/re-cert  Unsteadiness - Plan: PT plan of care cert/re-cert  Weakness generalized - Plan: PT plan of care cert/re-cert  Dizziness and  giddiness - Plan: PT plan of care cert/re-cert      Subjective Assessment - 06/27/15 1540    Subjective Pt reports having fallen Saturday evening which occurred seconday to pt having "caught left heel on the carpet". Pt was able to get up by herself. Pt reporting room-spinning "vertigo" when getting OOB on L side.   Pertinent History goes by "[name]"; anxiety, depression, DM, thyroid disease, GERD, hyperlipidemia, vertigo, HTN, B TKE (L in 2005; R in 2015); B plantar fasciitis   Limitations Walking;House hold activities   Currently in Pain? Yes   Pain Score 6    Pain Location Calf   Pain Orientation Left   Pain Descriptors / Indicators Aching;Other (Comment)   Pain Type Acute pain   Pain Onset In the past 7 days   Pain Frequency Intermittent   Aggravating Factors  standing up   Pain Relieving Factors putting Bio-Freeze on it                Vestibular Assessment - 06/27/15 0001    Vestibular Assessment   General Observation Noted gait instability with head turns   Symptom Behavior   Type of Dizziness Spinning  and  blurred vision with head movement   Frequency of Dizziness spinning when getting OOB in morning; blurred vision when turning head during walking   Duration of Dizziness "most mornings"   Aggravating Factors Looking up to the ceiling;Mornings;Turning head quickly;Rolling to left   Relieving Factors Head stationary   Occulomotor Exam   Occulomotor Alignment Normal   Spontaneous Absent   Gaze-induced Absent   Vestibulo-Occular Reflex   Comment (+) R Head Thrust accompanied by pt-reported blurring of vision   Positional Testing   Dix-Hallpike Dix-Hallpike Right;Dix-Hallpike Left   Dix-Hallpike Right   Dix-Hallpike Right Symptoms No nystagmus   Dix-Hallpike Left   Dix-Hallpike Left Duration 10-12 seconds   Dix-Hallpike Left Symptoms Upbeat, left rotatory nystagmus   Cognition   Cognition Orientation Level Oriented x 4                 [facility] Adult PT  Treatment/Exercise - 06/27/15 0001    Transfers   Transfers Sit to Stand;Stand to Sit   Sit to Stand 6: Modified independent (Device/Increase time)   Five time sit to stand comments  --   Stand to Sit 6: Modified independent (Device/Increase time)   Ambulation/Gait   Ambulation/Gait Yes   Ambulation/Gait Assistance 5: Supervision   Ambulation/Gait Assistance Details verbal/tactile cueing for sequencing of SPC with gait   Ambulation Distance (Feet) 345 Feet   Assistive device Straight cane   Gait Pattern Step-through pattern;Decreased stride length;Decreased hip/knee flexion - right;Decreased hip/knee flexion - left;Decreased dorsiflexion - right;Decreased dorsiflexion - left;Decreased trunk rotation;Poor foot clearance - right  minimal heel lift during terminal stance bilat   Ambulation Surface Level;Indoor   Gait velocity --         Vestibular Treatment/Exercise - 06/27/15 0001    Vestibular Treatment/Exercise   Vestibular Treatment Provided Canalith Repositioning;Gaze   Canalith Repositioning Epley Manuever Left   Gaze Exercises X1 Viewing Horizontal;X1 Viewing Vertical    EPLEY MANUEVER LEFT   Number of Reps  1   Overall Response  Improved Symptoms    RESPONSE DETAILS LEFT (-) L Hallpike when restested   X1 Viewing Horizontal   Foot Position shoulder width   Reps --  30 seconds x2   Comments subjective dizziness increased from 0/10 to 4/10   X1 Viewing Vertical   Foot Position shoulder width   Reps --  60 seconds   Comments subjective dizziness rating increased from 0/10 to 1/10               PT Education - 06/27/15 1645    Education provided Yes   Education Details BPPV. Epley maneuver. VOR x1 viewing exercise. Recommendation for cane.   Person(s) Educated Patient   Methods Explanation;Demonstration;Verbal cues;Handout   Comprehension Verbalized understanding;Returned demonstration          PT Short Term Goals - 06/27/15 1654    PT SHORT TERM GOAL #1    Title Pt will independently perform home exercises to indicate safe daily compliance with HEP. Target date: 07/11/15   Baseline HEP issued 8/3; gaze stabilization exercises initiated 8/10.   Status On-going   PT SHORT TERM GOAL #2   Title Pt will ambulate at self-selected gait speed of 2.63 ft/sec to indicate status of community ambulator. Target date: 07/11/15   Status On-going   PT SHORT TERM GOAL #3   Title Pt will perform 5 Times Sit-to-Stand Test in <15 seconds without UE support to indicate decreased risk of recurrent falls. Target date: 07/11/15   Status On-going  PT SHORT TERM GOAL #4   Title Perform DGI to assess gait stability in presence of external demands. Target date: 07/11/15   Baseline 06/20/15: baseline established with score of 15/24   Status Achieved           PT Long Term Goals - 06/27/15 1654    PT LONG TERM GOAL #1   Title Pt will verbalize understanding of fall prevention strategies to decrease risk of falls within home. Target date: 07/08/15   Status On-going   PT LONG TERM GOAL #2   Title Pt will score 20/24 on Dynamic Gait Index to indicate decreased fall risk, increased gait stability in presence of external demands. Target date: 07/08/15   Status On-going   PT LONG TERM GOAL #3   Title Pt will independently negotiate standard curb step with no overt LOB to indicate safety with community obstacle negotiation. Target date: 07/08/15   Status On-going   PT LONG TERM GOAL #4   Title Pt will negotiate 12 stairs with 1 rail and mod I to indicate ability to safely access second floor of home. Target date: 07/08/15   Status On-going   PT LONG TERM GOAL #5   Title Pt will participate in entire physical therapy session with </= 2-point increase in pain rating to indicate decreased effect of pain on pt participation in functional mobility. Target date: 07/08/15   Status On-going   PT LONG TERM GOAL #6   Title Pt will ambulate 300' over unlevel asphalt surfaces with mod I  using LRAD to indicate safety with limited community ambulation. Target date: 07/08/15   Status On-going               Plan - 06/27/15 1648    Clinical Impression Statement Per pt report of recent fall and symptoms of "vertigo", further assessed origin of dizziness during this session. Noted impaired VOR and L BPPV (L posterior canalithiasis). Treated BPPV with Epley Maneuver, after which symptoms resolved. Instructed pt in x1 viewing for HEP. Continue per POC.   Pt will benefit from skilled therapeutic intervention in order to improve on the following deficits Pain;Abnormal gait;Postural dysfunction;Decreased activity tolerance;Decreased balance;Dizziness;Decreased mobility;Decreased strength;Decreased knowledge of use of DME   Rehab Potential Good   Clinical Impairments Affecting Rehab Potential financial limitations (high insurance co-pay)   PT Frequency 1x / week  Recommended 2x/week for 8 weeks; however, pt requesting 1x/week due to high insurance co-pay   PT Duration 8 weeks   PT Treatment/Interventions Stair training;Functional mobility training;Therapeutic activities;Therapeutic exercise;Balance training;Neuromuscular re-education;Gait training;DME Instruction;Vestibular;Patient/family education;Orthotic Fit/Training;Cryotherapy;ADLs/Self Care Home Management;Canalith Repostioning   PT Next Visit Plan Check pt performance of VOR x1 viewing and progress horizontal head turns to pt tolerance. If MD order received for [facility], provide pt with cane from facility supply room.   PT Home Exercise Plan HEP issued on 06/20/15.   Consulted and Agree with Plan of Care Patient        Problem List Patient Active Problem List   Diagnosis Date Noted  . Abnormal CT scan   . Hematoma of left psoas region to anticoagulant therapy 10/11/2014  . Coagulopathy 10/11/2014  . Psoas hematoma, left, secondary to anticoagulant therapy 10/11/2014  . Acute hyponatremia 10/11/2014  . S/P total knee  arthroplasty 09/24/2014  . Hypertension   . GERD (gastroesophageal reflux disease)   . Diabetes type 2, controlled   . Depression   . Hyperlipidemia   . 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 Cough 08/31/2013  Billie Ruddy, PT, DPT [facility] [address] [place], [place], [address] Phone: [phone]   Fax:  [phone] 06/27/2015, 5:01 PM

## 2015-06-27 NOTE — Patient Instructions (Signed)
Gaze Stabilization: Tip Card 1.Target must remain in focus, not blurry, and appear stationary while head is in motion. 2.Perform exercises with small head movements (45 to either side of midline). 3.Increase speed of head motion so long as target is in focus. 4.Take your eyeglasses off for this exercise. 5.These exercises may provoke dizziness or nausea. Work through these symptoms. If too dizzy, slow head movement slightly. Rest between each exercise. 6.Exercises demand concentration; avoid distractions. 7.For safety, perform standing exercises close to a counter, wall, corner, or next to someone.    Gaze Stabilization: Standing Feet Apart    Stand with a stable chair in front of you. Keeping eyes on target on wall 3 feet away, tilt head down slightly and move head side to side for 30-60 times. Do __2-3__ sessions per day.

## 2015-07-04 ENCOUNTER — Encounter: Payer: Self-pay | Admitting: Physical Therapy

## 2015-07-04 ENCOUNTER — Ambulatory Visit: Payer: Medicare Other | Admitting: Physical Therapy

## 2015-07-04 DIAGNOSIS — R269 Unspecified abnormalities of gait and mobility: Secondary | ICD-10-CM | POA: Diagnosis not present

## 2015-07-04 DIAGNOSIS — R2681 Unsteadiness on feet: Secondary | ICD-10-CM

## 2015-07-04 DIAGNOSIS — R42 Dizziness and giddiness: Secondary | ICD-10-CM

## 2015-07-04 DIAGNOSIS — R531 Weakness: Secondary | ICD-10-CM

## 2015-07-04 NOTE — Patient Instructions (Signed)
Sit to Side-Lying   Sit on edge of bed.  1. Turn head 45 to right.  2. Maintain head position and lie down slowly on left side. Hold until symptoms subside, plus 30- seconds. 3. Sit up slowly. Hold until symptoms subside, plus 30 seconds  4. Turn head 45 to left.   Maintain head position and lie down slowly on right side. Hold until symptoms subside, plus 30 seconds. 6. Sit up slowly. Repeat sequence __3__ times each way. Do _2___ sessions per day.  Copyright  VHI. All rights reserved.

## 2015-07-04 NOTE — Therapy (Signed)
[facility] [address] [place], [place], [address] Phone: [phone]   Fax:  [phone]  Physical Therapy Treatment  Patient Details  Name: Amanda [name] MRN: [id number] Date of Birth: September 17, 1946 Referring Provider:  Saralyn Pilar, MD  Encounter Date: 07/04/2015      PT End of Session - 07/04/15 1545    Visit Number 4   Number of Visits 9   Date for PT Re-Evaluation 08/12/15   Authorization Type Medicare - G Codes   PT Start Time 03-04-1539   PT Stop Time 1619   PT Time Calculation (min) 39 min   Equipment Utilized During Treatment Gait belt   Behavior During Therapy [facility] for tasks assessed/performed      Past Medical History  Diagnosis Date  . Complication of anesthesia     "forgets to breathe" when she wakes up  . Vertigo   . Heart murmur   . Mitral valve prolapse   . Asthma     seasonal or with bronchitis  . Pneumonia     hx of  . Hypothyroidism   . Anxiety   . Headache   . Arthritis   . Hepatitis     A -- as teenager  . Hypertension   . GERD (gastroesophageal reflux disease)   . Diabetes type 2, controlled     fasting 100-120  . Depression   . Hyperlipidemia   . OSA on CPAP     Past Surgical History  Procedure Laterality Date  . Gallbladder surgery  1995  . Nasal sinus surgery  2004-03-03  . Throat surgery  03-03-04  . Partial hysterectomy  1983  . Cholecystectomy    . Abdominal hysterectomy    . Joint replacement Left     knee  . Fracture surgery Right     leg  . Total knee arthroplasty Right 09/12/2014    Procedure: TOTAL KNEE ARTHROPLASTY;  Surgeon: Meredith Pel, MD;  Location: [facility];  Service: Orthopedics;  Laterality: Right;    There were no vitals filed for this visit.  Visit Diagnosis:  Abnormality of gait  Dizziness and giddiness  Weakness generalized  Unsteadiness      Subjective Assessment - 07/04/15 1543    Subjective Reports having the worst migraine ever  after last session. Took one of her friends oxycodone pills and then called her MD and got a prescription. Has tried the new VOR HEP, stopped due to headaches starting.   Currently in Pain? No/denies   Pain Score 0-No pain           Vestibular Treatment/Exercise - 07/04/15 [place]   Number of Reps  3   Symptom Description  postive with lying toward the left, negative with lying toward the right.Decreased symptoms over multiple reps. dizziness lasting 1-2 minutes each time.                            X1 Viewing Horizontal   Foot Position shoulder width in seated   Reps 10  to 15 x 2 sets   Comments reported increased dizziness from 0/10 to 4/10   X1 Viewing Vertical   Foot Position shoulder width in seated   Reps 10  to 15 reps x 2 sets   Comments reported increased dizziness from 0/10 ro 4/10           PT Education - 07/04/15 1604  Education provided Yes   Education Details HEP: Building control surveyor issued for home for habituation   Person(s) Educated Patient   Methods Explanation;Demonstration;Handout   Comprehension Verbalized understanding;Returned demonstration          PT Short Term Goals - 06/27/15 1654    PT SHORT TERM GOAL #1   Title Pt will independently perform home exercises to indicate safe daily compliance with HEP. Target date: 07/11/15   Baseline HEP issued 8/3; gaze stabilization exercises initiated 8/10.   Status On-going   PT SHORT TERM GOAL #2   Title Pt will ambulate at self-selected gait speed of 2.63 ft/sec to indicate status of community ambulator. Target date: 07/11/15   Status On-going   PT SHORT TERM GOAL #3   Title Pt will perform 5 Times Sit-to-Stand Test in <15 seconds without UE support to indicate decreased risk of recurrent falls. Target date: 07/11/15   Status On-going   PT SHORT TERM GOAL #4   Title Perform DGI to assess gait stability in presence of external demands. Target date: 07/11/15   Baseline 06/20/15: baseline  established with score of 15/24   Status Achieved           PT Long Term Goals - 06/27/15 1654    PT LONG TERM GOAL #1   Title Pt will verbalize understanding of fall prevention strategies to decrease risk of falls within home. Target date: 07/08/15   Status On-going   PT LONG TERM GOAL #2   Title Pt will score 20/24 on Dynamic Gait Index to indicate decreased fall risk, increased gait stability in presence of external demands. Target date: 07/08/15   Status On-going   PT LONG TERM GOAL #3   Title Pt will independently negotiate standard curb step with no overt LOB to indicate safety with community obstacle negotiation. Target date: 07/08/15   Status On-going   PT LONG TERM GOAL #4   Title Pt will negotiate 12 stairs with 1 rail and mod I to indicate ability to safely access second floor of home. Target date: 07/08/15   Status On-going   PT LONG TERM GOAL #5   Title Pt will participate in entire physical therapy session with </= 2-point increase in pain rating to indicate decreased effect of pain on pt participation in functional mobility. Target date: 07/08/15   Status On-going   PT LONG TERM GOAL #6   Title Pt will ambulate 300' over unlevel asphalt surfaces with mod I using LRAD to indicate safety with limited community ambulation. Target date: 07/08/15   Status On-going           Plan - 07/04/15 1545    Clinical Impression Statement Had pt perform HEP (X1 exercises) from last session and provided cues to correct performance. No headache reported during or after. Also educated pt on Nestor Lewandowsky for home after she reported getting dizzy at home with rolling over and sitting up. No headache reported with this as well. Pt making steady progress toward goals.   Pt will benefit from skilled therapeutic intervention in order to improve on the following deficits Pain;Abnormal gait;Postural dysfunction;Decreased activity tolerance;Decreased balance;Dizziness;Decreased mobility;Decreased  strength;Decreased knowledge of use of DME   Rehab Potential Good   Clinical Impairments Affecting Rehab Potential financial limitations (high insurance co-pay)   PT Frequency 1x / week  Recommended 2x/week for 8 weeks; however, pt requesting 1x/week due to high insurance co-pay   PT Duration 8 weeks   PT Treatment/Interventions Stair training;Functional mobility training;Therapeutic activities;Therapeutic exercise;Balance  training;Neuromuscular re-education;Gait training;DME Instruction;Vestibular;Patient/family education;Orthotic Fit/Training;Cryotherapy;ADLs/Self Care Home Management;Canalith Repostioning   PT Next Visit Plan Assess how pt responded to todays session and HEP at home. Continue with gaze and vestibular activities to pt tolerance. If MD order for [facility] recieved, provide pt with cane from facility supply room.   PT Home Exercise Plan HEP issued on 06/20/15.   Consulted and Agree with Plan of Care Patient        Problem List Patient Active Problem List   Diagnosis Date Noted  . Abnormal CT scan   . Hematoma of left psoas region to anticoagulant therapy 10/11/2014  . Coagulopathy 10/11/2014  . Psoas hematoma, left, secondary to anticoagulant therapy 10/11/2014  . Acute hyponatremia 10/11/2014  . S/P total knee arthroplasty 09/24/2014  . Hypertension   . GERD (gastroesophageal reflux disease)   . Diabetes type 2, controlled   . Depression   . Hyperlipidemia   . 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 Cough 08/31/2013    Willow Ora 07/05/2015, 6:41 PM  Willow Ora, PTA, [facility] [address] [place], [place] [address] [phone] 07/05/2015, 6:41 PM

## 2015-07-09 ENCOUNTER — Telehealth: Payer: Self-pay | Admitting: Neurology

## 2015-07-09 NOTE — Telephone Encounter (Signed)
Spoke to [employer] Zigmund Daniel reports that pt said that [name] did her sleep study many years ago and now needs her cpap serviced. I advised Zigmund Daniel that since it has been at least 3 years since [name] has seen pt (she is not even in [facility] with [name]) she would need a referral back to [place] for her cpap. Zigmund Daniel said she would speak to pt and find out if this is something she is interested in.

## 2015-07-09 NOTE — Telephone Encounter (Signed)
Zigmund Daniel @ [facility] is calling with questions about who should service this patient's CPAP machine.  Please call.

## 2015-07-11 ENCOUNTER — Ambulatory Visit: Payer: Medicare Other | Admitting: Physical Therapy

## 2015-07-18 ENCOUNTER — Ambulatory Visit: Payer: Medicare Other | Admitting: Physical Therapy

## 2015-07-24 ENCOUNTER — Other Ambulatory Visit: Payer: Self-pay

## 2015-07-24 DIAGNOSIS — Z1231 Encounter for screening mammogram for malignant neoplasm of breast: Secondary | ICD-10-CM

## 2015-07-25 ENCOUNTER — Ambulatory Visit: Payer: Medicare Other | Admitting: Physical Therapy

## 2015-08-01 ENCOUNTER — Ambulatory Visit: Payer: Medicare Other | Admitting: Physical Therapy

## 2015-08-07 ENCOUNTER — Ambulatory Visit (INDEPENDENT_AMBULATORY_CARE_PROVIDER_SITE_OTHER): Payer: Medicare Other | Admitting: Neurology

## 2015-08-07 ENCOUNTER — Encounter: Payer: Self-pay | Admitting: Neurology

## 2015-08-07 VITALS — BP 100/72 | HR 78 | Resp 20 | Ht 64.5 in | Wt 213.0 lb

## 2015-08-07 DIAGNOSIS — Z9889 Other specified postprocedural states: Secondary | ICD-10-CM | POA: Diagnosis not present

## 2015-08-07 DIAGNOSIS — G4733 Obstructive sleep apnea (adult) (pediatric): Secondary | ICD-10-CM

## 2015-08-07 NOTE — Patient Instructions (Signed)

## 2015-08-07 NOTE — Progress Notes (Signed)
SLEEP MEDICINE CLINIC   Provider:  Larey Seat, M D  Referring Provider: Saralyn Pilar, MD Primary Care Physician:  Suzanna Obey, MD  Chief Complaint  Patient presents with  . sleep consult    on cpap, hasn't been using cpap for a while, cpap older than 9 years, needs  a new one, rm 10, alone    HPI:  Amanda [name] is a 69 y.o. female , seen here as a referral from Dr. Prince Rome for a sleep consultation.  I have met Amanda [name] many years ago when I took care of her husband who presented with progressive sexual disinhibition and frontal lobe dementia.   The patient is here today to be reevaluated for possible obstructive sleep apnea and treatment options to be discussed. She had some medical complications over the last 24 months. In November 2015 she had a knee replacement on the right and reports that her Coumadin levels were not checked after hospitalization and surgery. She was declining in her overall health became very weak and had finally been diagnosed with retroperitoneal bleed. She had very low red blood cell counts and hematocrits and had to be transfused. Her current medications include metformin, bupropion, trazodone, Effexor, L-thyroxine, atorvastatin, metoprolol, omeprazole, Klonopin, losartan. She lost 78 pounds over the last 2 years,  partially due to her medical complications , I think partially due to the stress with her husband's progressive illness. He no longer lives at home but is now institutionalized. He usually mistakes his wife for his sister.  There have been a lot of financial stressors.  The patient was diagnosed with sleep apnea over 10 years ago. She was placed on CPAP and had used it for many years until it broke and she is now not sure if she will be qualifies or needs a new test to requalify. She wakes up with a dry mouth. I have a sleep study from 2013 available here that was ordered as a split-night polysomnography. The patient was using CPAP at 14  cm water pressure at the time at home her AHI was 20 and her RDI 28.7. Her lowest oxygen nadir. His 84% in nonsupine non-REM sleep. The patient spent 87 minutes at or below 90% saturation. She was titrated to only 5 cm water pressure the AHI became 3.1 which led me to believe that she probably needs a slightly higher pressure than this. I will reevaluate the patient  Sleep habits are as follows: Patient goes to bed around 11 PM tries to rise in the morning at 8 AM not always feeling refreshed or restored enough to do so. She sleeps alone is alone in the house. There has been nobody witnessing her sleep. She does have a dog. She has noted that she falls sleep easier in daytime seems to be easy to doze off. She has seen her family medical physician to evaluate 3 recent falls. The patient has no history of shift work on nighttime work. 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  Usually asleep within 5 minutes , sleeping through the night, rarely have one interruption due to urinary urge. She wakes herself up from sleep snoring she reports has a dry mouth in the morning and sometimes a morning headache. In spite of the significant weight loss over the last 24 months she still considered obese.  I reviewed her recent metabolic panels and she does  have an elevated fasting glucose, some microalbuminuria. TSH has been reduced over the last 12 months but is in normal range, hemoglobin A1c 2 months ago was 6.1. The control of her HbA1c also correlated with the loss of weight.   Sleep medical history ; see above doagnosed 2004 for the first time, and 2013 re titrated after a SPLIT night study.   Social history: Due to her husband's dementing illness the couple live separated. He had to be institutionalized. Amanda [name] is retired and lives no alone in the families home.  She has 2 adult children that are not living  close by. She is not a smoker, not a drinker, has no history of illicit drug use.  Review of Systems: Out of a complete 14 system review, the patient complains of only the following symptoms, and all other reviewed systems are negative. Snoring, fatigue, EDS, dry mouth, obesety.   Her Epworth score was endorsed at 11 points the fatigue severity score was not endorsed the geriatric depression score was endorsed at at 13 out of 15 points. 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     Social History   Social History  . Marital Status: Married    Spouse Name: N/A  . Number of Children: N/A  . Years of Education: N/A   Occupational History  . retired     Secondary school teacher   Social History Main Topics  . Smoking status: Never Smoker   . Smokeless tobacco: Never Used  . Alcohol Use: No     Comment: socially  . Drug Use: No  . Sexual Activity: Not on file   Other Topics Concern  . Not on file   Social History Narrative    Family History  Problem Relation Age of Onset  . Cancer Mother     panceratic  . Cancer - Colon Mother     Past Medical History  Diagnosis Date  . Complication of anesthesia     "forgets to breathe" when she wakes up  . Vertigo   . Heart murmur   . Mitral valve prolapse   . Asthma     seasonal or with bronchitis  . Pneumonia     hx of  . Hypothyroidism   . Anxiety   . Headache   . Arthritis   . Hepatitis     A -- as teenager  . Hypertension   . GERD (gastroesophageal reflux disease)   . Diabetes type 2, controlled     fasting 100-120  . Depression   . Hyperlipidemia   . OSA on CPAP   . Anemia due to blood loss     Past Surgical History  Procedure Laterality Date  . Gallbladder surgery  1995  . Nasal sinus surgery  2005  . Throat surgery  2005  . Partial hysterectomy  1983  . Cholecystectomy    . Abdominal hysterectomy    . Joint replacement Left     knee  .  Fracture surgery Right     leg  . Total knee arthroplasty Right 09/12/2014    Procedure: TOTAL KNEE ARTHROPLASTY;  Surgeon: Meredith Pel, MD;  Location: [facility];  Service: Orthopedics;  Laterality: Right;    Current Outpatient Prescriptions  Medication Sig Dispense Refill  . albuterol (PROVENTIL HFA;VENTOLIN HFA) 108 (90 BASE) MCG/ACT inhaler Inhale 1 puff into the lungs every 6 (six) hours as needed for wheezing or shortness of breath.    Marland Kitchen  atorvastatin (LIPITOR) 20 MG tablet Take 20 mg by mouth.    Marland Kitchen buPROPion (WELLBUTRIN XL) 300 MG 24 hr tablet Take 300 mg by mouth daily.      . chlorpheniramine (CHLOR-TRIMETON) 4 MG tablet Take 4 mg by mouth at bedtime as needed for allergies.     . Cholecalciferol (VITAMIN D3) 1000 UNITS CAPS Take 1,000 Units by mouth daily.     . clonazePAM (KLONOPIN) 0.5 MG tablet Take 1 tablet (0.5 mg total) by mouth daily. (Patient taking differently: Take 0.5 mg by mouth 3 (three) times daily as needed for anxiety. ) 30 tablet 0  . HYDROcodone-acetaminophen (NORCO/VICODIN) 5-325 MG per tablet Take 1 tablet by mouth every 6 (six) hours as needed for moderate pain.    Marland Kitchen levothyroxine (SYNTHROID, LEVOTHROID) 25 MCG tablet Take 25 mcg by mouth daily.      Marland Kitchen losartan (COZAAR) 25 MG tablet Take 12.5 mg by mouth daily.     . metFORMIN (GLUCOPHAGE) 500 MG tablet Take 1 tablet (500 mg total) by mouth 2 (two) times daily with a meal.    . metoprolol (LOPRESSOR) 50 MG tablet Take 25 mg by mouth 2 (two) times daily.      Marland Kitchen omeprazole (PRILOSEC) 40 MG capsule Take 40 mg by mouth daily.      Marland Kitchen OVER THE COUNTER MEDICATION Place 1 drop into both eyes 4 (four) times daily as needed (for dry eyes). [employer] Lubricant Eye Drop    . Propylene Glycol (SYSTANE BALANCE) 0.6 % SOLN Apply 1 drop to eye every 6 (six) hours as needed.     . traZODone (DESYREL) 100 MG tablet Take 100-200 mg by mouth at bedtime. Most of the time takes 100 mg    . venlafaxine (EFFEXOR) 50 MG tablet Take 100  mg by mouth daily.    Dema Severin Petrolatum-Mineral Oil (PURALUBE) 85-15 % OINT Apply 1 application to eye daily as needed.      No current facility-administered medications for this visit.    Allergies as of 08/07/2015 - Review Complete 08/07/2015  Allergen Reaction Noted  . Aciphex [rabeprazole sodium] Hives 09/23/2011  . Erythromycin Nausea And Vomiting 09/23/2011  . Macrodantin Nausea And Vomiting 09/23/2011  . Nsaids  08/30/2014  . Omeprazole-sodium bicarbonate [omeprazole-sodium bicarbonate] Hives 09/23/2011  . Sulfa antibiotics Nausea Only and Other (See Comments) 09/23/2011  . Tape Rash 08/31/2014    Vitals: BP 100/72 mmHg  Pulse 78  Resp 20  Ht 5' 4.5" (1.638 m)  Wt 213 lb (96.616 kg)  BMI 36.01 kg/m2 Last Weight:  Wt Readings from Last 1 Encounters:  08/07/15 213 lb (96.616 kg)   RAQ:TMAU mass index is 36.01 kg/(m^2).     Last Height:   Ht Readings from Last 1 Encounters:  08/07/15 5' 4.5" (1.638 m)    Physical exam:  General: The patient is awake, alert and appears not in acute distress. The patient is well groomed. Head: Normocephalic, atraumatic. Neck is supple. Mallampati 3,  neck circumference: 15 . Nasal airflow unrestricted , TMJ click is evident . Retrognathia high grade.   Cardiovascular:  Regular rate and rhythm , without  murmurs or carotid bruit, and without distended neck veins. Respiratory: Lungs are clear to auscultation. Skin:  Without evidence of edema, or rash Trunk: BMI is elevated  The patient's posture is erect   Neurologic exam : The patient is awake and alert, oriented to place and time.   Memory subjective described as intact.  Attention span & concentration  ability appears normal.  Speech is fluent,  without  dysarthria, dysphonia or aphasia.  Mood and affect are appropriate.  Cranial nerves: Pupils are equal and briskly reactive to light. Funduscopic exam without evidence of pallor or edema. Extraocular movements  in vertical and  horizontal planes intact and without nystagmus. Visual fields by finger perimetry are intact. Hearing to finger rub intact.   Facial sensation intact to fine touch.  Facial motor strength is symmetric and tongue and uvula move midline. Shoulder shrug was symmetrical. The patient has droopy shoulders.   Motor exam:   Normal tone, muscle bulk and symmetric strength in all extremities.  Sensory:  Fine touch, pinprick and vibration were tested in all extremities. Proprioception tested in the upper extremities was normal.  Coordination: Rapid alternating movements in the fingers/hands was normal. Finger-to-nose maneuver  normal without evidence of ataxia, dysmetria or tremor.  Gait and station: Patient walks without assistive device and is able unassisted to climb up to the exam table.  She walks arthralgic . Strength within normal limits.  Stance is stable and normal. Toe and heel stand were tested. Tandem gait is unfragmented. Turns with  3  Steps. Romberg testing is negative.  Deep tendon reflexes: in the  upper and lower extremities are symmetric and intact. Babinski maneuver response is  downgoing.  The patient was advised of the nature of the diagnosed sleep disorder , the treatment options and risks for general a health and wellness arising from not treating the condition.  I spent more than 45 minutes of face to face time with the patient.  Greater than 50% of time was spent in counseling and coordination of care. We have discussed the diagnosis and differential and I answered the patient's questions.     Assessment:  After physical and neurologic examination, review of laboratory studies,  Personal review of imaging studies, reports of other /same  Imaging studies ,  Results of polysomnography/ neurophysiology testing and pre-existing records as far as provided in visit., my assessment is   1) Amanda [name] CPAP machine broke and I have no doubt that she is still in need of CPAP. Given the  results of her last sleep study 3 years ago I would like for her to undergo an attended sleep study to requalify by now under Medicare guidelines for the use of CPAP. Amanda [name]'s risk factors for obstructive sleep apnea are obesity, retrognathia and a low-grade Mallampati. She underwent UPPP.  2) Amanda [name] is clinically depressed and this is understandable in her social and financial situation. 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 There is very little help from the 2 adult children. Especially no financial or emotional help. Given the high score of 13 out of 15 possible points on her geriatric depression score I think she is at this time under treated for depression also she has seen a counselor and psychiatrist  3)obesity .  4)gait disorder.         Asencion Partridge Dohmeier MD  08/07/2015   CC: Saralyn Pilar, Md [address] [place], [place] [address]

## 2015-08-27 ENCOUNTER — Ambulatory Visit
Admission: RE | Admit: 2015-08-27 | Discharge: 2015-08-27 | Disposition: A | Discharge status: Home / Self Care | Payer: Medicare Other | Source: Ambulatory Visit

## 2015-08-27 DIAGNOSIS — Z1231 Encounter for screening mammogram for malignant neoplasm of breast: Secondary | ICD-10-CM

## 2015-08-28 ENCOUNTER — Ambulatory Visit (INDEPENDENT_AMBULATORY_CARE_PROVIDER_SITE_OTHER): Payer: Medicare Other | Admitting: Neurology

## 2015-08-28 DIAGNOSIS — Z9889 Other specified postprocedural states: Secondary | ICD-10-CM

## 2015-08-28 DIAGNOSIS — G4733 Obstructive sleep apnea (adult) (pediatric): Secondary | ICD-10-CM

## 2015-08-28 NOTE — Sleep Study (Signed)
Please see the scanned sleep study interpretation located in the procedure tab within the chart review section.   

## 2015-09-03 ENCOUNTER — Telehealth: Payer: Self-pay

## 2015-09-03 DIAGNOSIS — G4733 Obstructive sleep apnea (adult) (pediatric): Secondary | ICD-10-CM

## 2015-09-03 NOTE — Telephone Encounter (Signed)
Called pt to give her sleep study results. Left message asking her to call me back.

## 2015-09-04 NOTE — Telephone Encounter (Signed)
Called to give pt sleep study results. No answer, left a message asking her to call me back.

## 2015-09-05 NOTE — Telephone Encounter (Signed)
Spoke to pt regarding her sleep study results. I advised her that her study showed osa and cpap therapy is recommended. Pt has already been on cpap but the machine is broken and 63-70 years old. "[employer]" gave her the first cpap but they are out of business now. I advised her that I would send the order to [facility]. Pt knows to use the machine for at least four or more hours. F/u appt was made for 12/21 at 1:30 to follow up with her new cpap.

## 2015-09-17 ENCOUNTER — Telehealth: Payer: Self-pay

## 2015-09-17 ENCOUNTER — Telehealth: Payer: Self-pay | Admitting: Neurology

## 2015-09-17 NOTE — Telephone Encounter (Signed)
Our sleep lab received a call that pt has not heard from [facility] regarding set up. I called [facility], and they have been trying to call her and have left three messages at the correct mobile number listed for the pt.  I also called the pt at the mobile number listed and it went straight to voicemail. I left her a message asking her to call back.  She needs to call [facility] at [phone] and ask for [facility]. Her appt for her f/u cpap needs to be pushed out farther than 12/21.

## 2015-09-18 ENCOUNTER — Telehealth: Payer: Self-pay | Admitting: Neurology

## 2015-09-18 NOTE — Telephone Encounter (Signed)
Patient called back wanting to know if we ever got in touch with DME regarding CPAP equipment. I advised [facility] has tried to reach her, has left three messages. Patient states, "they have not tried to call me" (patient's correct phone number is [phone]). Patient wanted to know the name of the company "I'll just call them myself". Gave patient name of company along with phone number and advised patient to ask for [facility].

## 2015-09-18 NOTE — Telephone Encounter (Signed)
Patient called to let us know she hasn't heard anything from the DME.  I took the message to nurse earlier and now I am documenting.

## 2015-09-18 NOTE — Telephone Encounter (Signed)
Spoke to pt. The only number we have on file for her is [phone]. Pt states that this is her cell number and it rarely works. I advised her that I left a message at this number, and [facility] did as well, several times. Pt wants me to put [phone] down as her home number and use that number. I advised pt that I would call [facility] and advise them of the new number and have them call her.  Pt's appt was changed to 11/21/14 to accommodate for the delay in getting her cpap. Pt verbalized understanding to bring cpap and arrive 15 minutes early.  I called [employer] and spoke to Artois, who would pass along this message to [facility], and she would call the pt ASAP.

## 2015-10-08 NOTE — Telephone Encounter (Signed)
Error

## 2015-11-07 ENCOUNTER — Ambulatory Visit: Payer: Self-pay | Admitting: Neurology

## 2015-11-14 ENCOUNTER — Emergency Department (HOSPITAL_COMMUNITY): Payer: Medicare Other

## 2015-11-14 ENCOUNTER — Encounter (HOSPITAL_COMMUNITY): Payer: Self-pay | Admitting: Emergency Medicine

## 2015-11-14 ENCOUNTER — Emergency Department (HOSPITAL_COMMUNITY)
Admission: EM | Admit: 2015-11-14 | Discharge: 2015-11-14 | Disposition: A | Discharge status: Home / Self Care | Payer: Medicare Other | Attending: Emergency Medicine | Admitting: Emergency Medicine

## 2015-11-14 DIAGNOSIS — M1990 Unspecified osteoarthritis, unspecified site: Secondary | ICD-10-CM | POA: Diagnosis not present

## 2015-11-14 DIAGNOSIS — Z7984 Long term (current) use of oral hypoglycemic drugs: Secondary | ICD-10-CM | POA: Insufficient documentation

## 2015-11-14 DIAGNOSIS — E039 Hypothyroidism, unspecified: Secondary | ICD-10-CM | POA: Diagnosis not present

## 2015-11-14 DIAGNOSIS — E785 Hyperlipidemia, unspecified: Secondary | ICD-10-CM | POA: Diagnosis not present

## 2015-11-14 DIAGNOSIS — K219 Gastro-esophageal reflux disease without esophagitis: Secondary | ICD-10-CM | POA: Diagnosis not present

## 2015-11-14 DIAGNOSIS — G4733 Obstructive sleep apnea (adult) (pediatric): Secondary | ICD-10-CM | POA: Insufficient documentation

## 2015-11-14 DIAGNOSIS — Z9981 Dependence on supplemental oxygen: Secondary | ICD-10-CM | POA: Insufficient documentation

## 2015-11-14 DIAGNOSIS — F329 Major depressive disorder, single episode, unspecified: Secondary | ICD-10-CM | POA: Diagnosis not present

## 2015-11-14 DIAGNOSIS — R011 Cardiac murmur, unspecified: Secondary | ICD-10-CM | POA: Insufficient documentation

## 2015-11-14 DIAGNOSIS — F419 Anxiety disorder, unspecified: Secondary | ICD-10-CM | POA: Diagnosis not present

## 2015-11-14 DIAGNOSIS — Z862 Personal history of diseases of the blood and blood-forming organs and certain disorders involving the immune mechanism: Secondary | ICD-10-CM | POA: Diagnosis not present

## 2015-11-14 DIAGNOSIS — E119 Type 2 diabetes mellitus without complications: Secondary | ICD-10-CM | POA: Insufficient documentation

## 2015-11-14 DIAGNOSIS — J45909 Unspecified asthma, uncomplicated: Secondary | ICD-10-CM | POA: Insufficient documentation

## 2015-11-14 DIAGNOSIS — Z8701 Personal history of pneumonia (recurrent): Secondary | ICD-10-CM | POA: Diagnosis not present

## 2015-11-14 DIAGNOSIS — I10 Essential (primary) hypertension: Secondary | ICD-10-CM | POA: Insufficient documentation

## 2015-11-14 DIAGNOSIS — Z79899 Other long term (current) drug therapy: Secondary | ICD-10-CM | POA: Insufficient documentation

## 2015-11-14 DIAGNOSIS — R05 Cough: Secondary | ICD-10-CM | POA: Diagnosis present

## 2015-11-14 DIAGNOSIS — J40 Bronchitis, not specified as acute or chronic: Secondary | ICD-10-CM

## 2015-11-14 MED ORDER — LEVOFLOXACIN 500 MG PO TABS: 500.0000 mg | ORAL_TABLET | Freq: Every day | ORAL | Status: DC

## 2015-11-14 NOTE — ED Notes (Signed)
Patient transported to X-ray 

## 2015-11-14 NOTE — Discharge Instructions (Signed)
Follow up with your md if not improving. °

## 2015-11-14 NOTE — ED Provider Notes (Signed)
CSN: [id number]     Arrival date & time 11/14/15  1230 History   First MD Initiated Contact with Patient 11/14/15 1241     Chief Complaint  Patient presents with  . Headache  . Cough     (Consider location/radiation/quality/duration/timing/severity/associated sxs/prior Treatment) Patient is a 69 y.o. female presenting with cough. The history is provided by the patient (The patient states she's had a cough and sinus congestion for weeks she was put on Augmentin without help).  Cough Cough characteristics:  Non-productive Severity:  Moderate Onset quality:  Gradual Timing:  Constant Chronicity:  New Context: not animal exposure   Associated symptoms: no chest pain, no eye discharge, no headaches and no rash     Past Medical History  Diagnosis Date  . Complication of anesthesia     "forgets to breathe" when she wakes up  . Vertigo   . Heart murmur   . Mitral valve prolapse   . Asthma     seasonal or with bronchitis  . Pneumonia     hx of  . Hypothyroidism   . Anxiety   . Headache   . Arthritis   . Hepatitis     A -- as teenager  . Hypertension   . GERD (gastroesophageal reflux disease)   . Diabetes type 2, controlled ([place])     fasting 100-120  . Depression   . Hyperlipidemia   . OSA on CPAP   . Anemia due to blood loss    Past Surgical History  Procedure Laterality Date  . Gallbladder surgery  1995  . Nasal sinus surgery  2005  . Throat surgery  2005  . Partial hysterectomy  1983  . Cholecystectomy    . Abdominal hysterectomy    . Joint replacement Left     knee  . Fracture surgery Right     leg  . Total knee arthroplasty Right 09/12/2014    Procedure: TOTAL KNEE ARTHROPLASTY;  Surgeon: Meredith Pel, MD;  Location: [place];  Service: Orthopedics;  Laterality: Right;   Family History  Problem Relation Age of Onset  . Cancer Mother     panceratic  . Cancer - Colon Mother    Social History  Substance Use Topics  . Smoking status: Never Smoker   .  Smokeless tobacco: Never Used  . Alcohol Use: No     Comment: socially   OB History    No data available     Review of Systems  Constitutional: Negative for appetite change and fatigue.  HENT: Negative for congestion, ear discharge and sinus pressure.        Sinus congestion  Eyes: Negative for discharge.  Respiratory: Positive for cough.   Cardiovascular: Negative for chest pain.  Gastrointestinal: Negative for abdominal pain and diarrhea.  Genitourinary: Negative for frequency and hematuria.  Musculoskeletal: Negative for back pain.  Skin: Negative for rash.  Neurological: Negative for seizures and headaches.  Psychiatric/Behavioral: Negative for hallucinations.      Allergies  Aciphex; Erythromycin; Macrodantin; Nsaids; Omeprazole-sodium bicarbonate; Sulfa antibiotics; and Tape  Home Medications   Prior to Admission medications   Medication Sig Start Date End Date Taking? Authorizing Provider  albuterol (PROVENTIL HFA;VENTOLIN HFA) 108 (90 BASE) MCG/ACT inhaler Inhale 1 puff into the lungs every 6 (six) hours as needed for wheezing or shortness of breath.   Yes Historical Provider, MD  buPROPion (WELLBUTRIN XL) 300 MG 24 hr tablet Take 300 mg by mouth daily.     Yes Historical  Provider, MD  Cholecalciferol (VITAMIN D3) 1000 UNITS CAPS Take 1,000 Units by mouth daily.    Yes Historical Provider, MD  HYDROcodone-acetaminophen (NORCO/VICODIN) 5-325 MG per tablet Take 1 tablet by mouth every 6 (six) hours as needed for moderate pain.   Yes Historical Provider, MD  levothyroxine (SYNTHROID, LEVOTHROID) 25 MCG tablet Take 25 mcg by mouth daily.     Yes Historical Provider, MD  losartan (COZAAR) 25 MG tablet Take 25 mg by mouth daily.    Yes Historical Provider, MD  metFORMIN (GLUCOPHAGE) 500 MG tablet Take 1 tablet (500 mg total) by mouth 2 (two) times daily with a meal. 10/14/14  Yes Thurnell Lose, MD  metoprolol tartrate (LOPRESSOR) 25 MG tablet Take 25 mg by mouth 2 (two)  times daily.   Yes Historical Provider, MD  omeprazole (PRILOSEC) 40 MG capsule Take 40 mg by mouth daily.     Yes Historical Provider, MD  pravastatin (PRAVACHOL) 40 MG tablet Take 40 mg by mouth daily.   Yes Historical Provider, MD  Propylene Glycol (SYSTANE BALANCE) 0.6 % SOLN Apply 1 drop to eye every 6 (six) hours as needed. For dry eyes   Yes Historical Provider, MD  traZODone (DESYREL) 100 MG tablet Take 100-200 mg by mouth at bedtime. Most of the time takes 100 mg   Yes Historical Provider, MD  venlafaxine (EFFEXOR) 50 MG tablet Take 100 mg by mouth daily.   Yes Historical Provider, MD  atorvastatin (LIPITOR) 20 MG tablet Take 20 mg by mouth. 05/18/15 05/17/16  Historical Provider, MD  chlorpheniramine (CHLOR-TRIMETON) 4 MG tablet Take 4 mg by mouth at bedtime as needed for allergies.     Historical Provider, MD  clonazePAM (KLONOPIN) 0.5 MG tablet Take 1 tablet (0.5 mg total) by mouth daily. Patient taking differently: Take 0.5 mg by mouth 3 (three) times daily as needed for anxiety.  09/14/14   Meredith Pel, MD  levofloxacin (LEVAQUIN) 500 MG tablet Take 1 tablet (500 mg total) by mouth daily. 11/14/15   Milton Ferguson, MD  metoprolol (LOPRESSOR) 50 MG tablet Take 25 mg by mouth 2 (two) times daily.      Historical Provider, MD  OVER THE COUNTER MEDICATION Place 1 drop into both eyes 4 (four) times daily as needed (for dry eyes). [place] Drop    Historical Provider, MD  White Petrolatum-Mineral Oil (PURALUBE) 85-15 % OINT Apply 1 application to eye daily as needed.     Historical Provider, MD   BP 122/58 mmHg  Pulse 67  Temp(Src) 98.4 F (36.9 C) (Oral)  Resp 26  SpO2 99% Physical Exam  Constitutional: She is oriented to person, place, and time. She appears well-developed.  HENT:  Head: Normocephalic.  Tender maxillary sinus  Eyes: Conjunctivae and EOM are normal. No scleral icterus.  Neck: Neck supple. No thyromegaly present.  Cardiovascular: Normal rate and  regular rhythm.  Exam reveals no gallop and no friction rub.   No murmur heard. Pulmonary/Chest: No stridor. She has no wheezes. She has no rales. She exhibits no tenderness.  Abdominal: She exhibits no distension. There is no tenderness. There is no rebound.  Musculoskeletal: Normal range of motion. She exhibits no edema.  Lymphadenopathy:    She has no cervical adenopathy.  Neurological: She is oriented to person, place, and time. She exhibits normal muscle tone. Coordination normal.  Skin: No rash noted. No erythema.  Psychiatric: She has a normal mood and affect. Her behavior is normal.  ED Course  Procedures (including critical care time) Labs Review Labs Reviewed - No data to display  Imaging Review Dg Chest 2 View  11/14/2015  CLINICAL DATA:  Cough.  Chills. EXAM: CHEST  2 VIEW COMPARISON:  08/31/2014 FINDINGS: The heart size and mediastinal contours are within normal limits. Both lungs are clear. The visualized skeletal structures are unremarkable. IMPRESSION: No active cardiopulmonary disease. Electronically Signed   By: Lorriane Shire M.D.   On: 11/14/2015 13:04   I have personally reviewed and evaluated these images and lab results as part of my medical decision-making.   EKG Interpretation None      MDM   Final diagnoses:  Bronchitis    Bronchitis and sinusitis not helped with Augmentin. We'll prescribe Levaquin and have her follow-up with her M.D.    Milton Ferguson, MD 11/14/15 [phone]

## 2015-11-14 NOTE — ED Notes (Signed)
Per EMS: pt sts intermittent right sided HA x 2 weeks; pt sts cough and congestion; pt taking tylenol with relief; pt did not take htn meds today; pt denies pain at present

## 2015-11-20 ENCOUNTER — Telehealth: Payer: Self-pay | Admitting: Neurology

## 2015-11-20 NOTE — Telephone Encounter (Signed)
Amanda [name]/Friend [phone] called regarding concerns she has about patient, requested to speak directly with [name], advised that we don't have a DPR on file to talk with her, however I will send a message, Amanda Mola states she doesn't need [name] to tell her anything, she needs to tell [name] something. States memory issues, some dementia is coming, some of the symptoms she is seeing, wants to know if [name] can give patient a memory test. States she forgets so many things, forgets appointments or shows up too early, her house is real messy and usually her house is very organized. "Sometimes when she looks at her it's as if nobody is home". She is concerned because patient lives alone.

## 2015-11-22 ENCOUNTER — Encounter: Payer: Self-pay | Admitting: Neurology

## 2015-11-22 ENCOUNTER — Ambulatory Visit (INDEPENDENT_AMBULATORY_CARE_PROVIDER_SITE_OTHER): Payer: Medicare Other | Admitting: Neurology

## 2015-11-22 VITALS — BP 122/78 | HR 72 | Resp 20 | Ht 64.5 in | Wt 211.0 lb

## 2015-11-22 DIAGNOSIS — G4733 Obstructive sleep apnea (adult) (pediatric): Secondary | ICD-10-CM

## 2015-11-22 DIAGNOSIS — J202 Acute bronchitis due to streptococcus: Secondary | ICD-10-CM

## 2015-11-22 DIAGNOSIS — Z9989 Dependence on other enabling machines and devices: Principal | ICD-10-CM

## 2015-11-22 DIAGNOSIS — F329 Major depressive disorder, single episode, unspecified: Secondary | ICD-10-CM

## 2015-11-22 DIAGNOSIS — F32A Depression, unspecified: Secondary | ICD-10-CM | POA: Insufficient documentation

## 2015-11-22 MED ORDER — LEVOFLOXACIN 500 MG PO TABS: 500.0000 mg | ORAL_TABLET | Freq: Every day | ORAL | Status: DC

## 2015-11-22 MED ORDER — VENLAFAXINE HCL 50 MG PO TABS
100.0000 mg | ORAL_TABLET | Freq: Every day | ORAL | Status: AC
Start: 2015-11-22 — End: ?

## 2015-11-22 NOTE — Progress Notes (Signed)
SLEEP MEDICINE CLINIC   Provider:  Larey Seat, M D  Referring Provider: Katherina Mires, MD Primary Care Physician:  Suzanna Obey, MD  Chief Complaint  Patient presents with  . Follow-up    cpap, hasn't been able to use cpap due to bronchitis, rm 11, alone    HPI:  Amanda [name] is a 70 y.o. female , seen here as a referral from Dr. Doreene Nest for a sleep consultation.  I have met Amanda [name] many years ago when I took care of her husband who presented with progressive sexual disinhibition and frontal lobe dementia.   The patient is here today to be reevaluated for possible obstructive sleep apnea and treatment options to be discussed. She had some medical complications over the last 24 months. In November 2015 she had a knee replacement on the right and reports that her Coumadin levels were not checked after hospitalization and surgery. She was declining in her overall health became very weak and had finally been diagnosed with retroperitoneal bleed. She had very low red blood cell counts and hematocrits and had to be transfused. Her current medications include metformin, bupropion, trazodone, Effexor, L-thyroxine, atorvastatin, metoprolol, omeprazole, Klonopin, losartan. She lost 78 pounds over the last 2 years,  partially due to her medical complications , I think partially due to the stress with her husband's progressive illness. He no longer lives at home but is now institutionalized. He usually mistakes his wife for his sister.  There have been a lot of financial stressors.  The patient was diagnosed with sleep apnea over 10 years ago. She was placed on CPAP and had used it for many years until it broke and she is now not sure if she will be qualifies or needs a new test to requalify. She wakes up with a dry mouth. I have a sleep study from 2013 available here that was ordered as a split-night polysomnography. The patient was using CPAP at 14 cm water pressure at the time at home her  AHI was 20 and her RDI 28.7. Her lowest oxygen nadir. His 84% in nonsupine non-REM sleep. The patient spent 87 minutes at or below 90% saturation. She was titrated to only 5 cm water pressure the AHI became 3.1 which led me to believe that she probably needs a slightly higher pressure than this. I will reevaluate the patient  Sleep habits are as follows: Patient goes to bed around 11 PM tries to rise in the morning at 8 AM not always feeling refreshed or restored enough to do so. She sleeps alone is alone in the house. There has been nobody witnessing her sleep. She does have a dog. She has noted that she falls sleep easier in daytime seems to be easy to doze off. She has seen her family medical physician to evaluate 3 recent falls. The patient has no history of shift work on nighttime work. 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  Usually asleep within 5 minutes , sleeping through the night, rarely have one interruption due to urinary urge. She wakes herself up from sleep snoring she reports has a dry mouth in the morning and sometimes a morning headache. In spite of the significant weight loss over the last 24 months she still considered obese.  I reviewed her recent metabolic panels and she does have an elevated fasting glucose, some microalbuminuria. TSH has been  reduced over the last 12 months but is in normal range, hemoglobin A1c 2 months ago was 6.1. The control of her HbA1c also correlated with the loss of weight. Sleep medical history ; see above doagnosed 2004 for the first time, and 2013 re titrated after a SPLIT night study.  Social history: Due to her husband's dementing illness the couple live separated. He had to be institutionalized. Amanda [name] is retired and lives no alone in the families home.  She has 2 adult children that are not living close by. She is not a smoker, not a drinker, has  no history of illicit drug use.    Revisit from 11/22/2015. Amanda [name] is living alone since her husband had to be confined to a memory loss unit. She has a tendency to develop wintertime bronchitis and often develops pneumonia on top of that. She has now struggled 3 weeks with bronchitis. She had to stop using the CPAP on December 23 because of this pulmonary underlying condition. For the first 22 days of the month however her compliance was excellent at 93%. She is using the machine on average 6 hours and 56 minutes. Set pressure is 6 cm water was too thin meter EPR the AHI is 4.0 which is good.  The patient was referred for sleep study performed on 08/28/2015. She had endorsed the Epworth sleepiness score at 1211 points and presented with a BMI of 35.4. Her AHI was 16.3 and her RDI was 19.3 during supine sleep that your back her AHI was 21.2. She did have brief periods of oxygen desaturation. She did have frequent periods of limb movements which also woke her up with an arousal index of 17.8. I recommended 2 things CPAP at 6 cm water, a nasal mask as long as she has the ability to breathe through the nose  .  As to her frequent limb movements I think they need to be treated with medication. Restless leg syndrome can also be  following iron deficiency, and this patient has IBS.  This is our first visit with the patient since her 69th birthday. For this reason I also performed a Montral cognitive assessment test was her. She did failed the trail making test, she was able to perform a cube drawing named ALT 3 animals correctly and to a clock face. She missed 2 points of the delayed recall option and one attention.  Montreal Cognitive Assessment  11/22/2015  Visuospatial/ Executive (0/5) 4  Naming (0/3) 3  Attention: Read list of digits (0/2) 1  Attention: Read list of letters (0/1) 1  Attention: Serial 7 subtraction starting at 100 (0/3) 3  Language: Repeat phrase (0/2) 2  Language : Fluency (0/1) 1   Abstraction (0/2) 2  Delayed Recall (0/5) 3  Orientation (0/6) 6  Total 26  Adjusted Score (based on education) 26   BS degree in education.    Review of Systems: Out of a complete 14 system review, the patient complains of only the following symptoms, and all other reviewed systems are negative.  Snoring, fatigue, EDS, dry mouth, obese,  Coughing,productive cough.   Her Epworth score was endorsed at 11 points the fatigue severity score was not endorsed the geriatric depression score was endorsed at at 13 out of 15 points.  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     Social History   Social History  . Marital Status: Married    Spouse Name:  N/A  . Number of Children: N/A  . Years of Education: N/A   Occupational History  . retired     Secondary school teacher   Social History Main Topics  . Smoking status: Never Smoker   . Smokeless tobacco: Never Used  . Alcohol Use: No     Comment: socially  . Drug Use: No  . Sexual Activity: Not on file   Other Topics Concern  . Not on file   Social History Narrative    Family History  Problem Relation Age of Onset  . Cancer Mother     panceratic  . Cancer - Colon Mother     Past Medical History  Diagnosis Date  . Complication of anesthesia     "forgets to breathe" when she wakes up  . Vertigo   . Heart murmur   . Mitral valve prolapse   . Asthma     seasonal or with bronchitis  . Pneumonia     hx of  . Hypothyroidism   . Anxiety   . Headache   . Arthritis   . Hepatitis     A -- as teenager  . Hypertension   . GERD (gastroesophageal reflux disease)   . Diabetes type 2, controlled ([place])     fasting 100-120  . Depression   . Hyperlipidemia   . OSA on CPAP   . Anemia due to blood loss     Past Surgical History  Procedure Laterality Date  . Gallbladder surgery  1995  . Nasal sinus surgery  2005  . Throat surgery  2005  . Partial  hysterectomy  1983  . Cholecystectomy    . Abdominal hysterectomy    . Joint replacement Left     knee  . Fracture surgery Right     leg  . Total knee arthroplasty Right 09/12/2014    Procedure: TOTAL KNEE ARTHROPLASTY;  Surgeon: Meredith Pel, MD;  Location: [facility];  Service: Orthopedics;  Laterality: Right;    Current Outpatient Prescriptions  Medication Sig Dispense Refill  . albuterol (PROVENTIL HFA;VENTOLIN HFA) 108 (90 BASE) MCG/ACT inhaler Inhale 1 puff into the lungs every 6 (six) hours as needed for wheezing or shortness of breath.    Marland Kitchen buPROPion (WELLBUTRIN XL) 300 MG 24 hr tablet Take 300 mg by mouth daily.      . chlorpheniramine (CHLOR-TRIMETON) 4 MG tablet Take 4 mg by mouth at bedtime as needed for allergies.     . Cholecalciferol (VITAMIN D3) 1000 UNITS CAPS Take 1,000 Units by mouth daily.     . clonazePAM (KLONOPIN) 0.5 MG tablet Take 1 tablet (0.5 mg total) by mouth daily. (Patient taking differently: Take 0.5 mg by mouth 3 (three) times daily as needed for anxiety. ) 30 tablet 0  . HYDROcodone-acetaminophen (NORCO/VICODIN) 5-325 MG per tablet Take 1 tablet by mouth every 6 (six) hours as needed for moderate pain.    Marland Kitchen levofloxacin (LEVAQUIN) 500 MG tablet Take 1 tablet (500 mg total) by mouth daily. 10 tablet 0  . levothyroxine (SYNTHROID, LEVOTHROID) 25 MCG tablet Take 25 mcg by mouth daily.      Marland Kitchen losartan (COZAAR) 25 MG tablet Take 25 mg by mouth daily.     . metFORMIN (GLUCOPHAGE) 500 MG tablet Take 1 tablet (500 mg total) by mouth 2 (two) times daily with a meal.    . metoprolol (LOPRESSOR) 50 MG tablet Take 25 mg by mouth 2 (two) times daily.      . metoprolol  tartrate (LOPRESSOR) 25 MG tablet Take 25 mg by mouth 2 (two) times daily.    Marland Kitchen omeprazole (PRILOSEC) 40 MG capsule Take 40 mg by mouth daily.      Marland Kitchen OVER THE COUNTER MEDICATION Place 1 drop into both eyes 4 (four) times daily as needed (for dry eyes). FreshKote Lubricant Eye Drop    . pravastatin  (PRAVACHOL) 40 MG tablet Take 40 mg by mouth daily.    Marland Kitchen Propylene Glycol (SYSTANE BALANCE) 0.6 % SOLN Apply 1 drop to eye every 6 (six) hours as needed. For dry eyes    . traZODone (DESYREL) 100 MG tablet Take 100-200 mg by mouth at bedtime. Most of the time takes 100 mg    . venlafaxine (EFFEXOR) 50 MG tablet Take 100 mg by mouth daily.    Dema Severin Petrolatum-Mineral Oil (PURALUBE) 85-15 % OINT Apply 1 application to eye daily as needed.      No current facility-administered medications for this visit.    Allergies as of 11/22/2015 - Review Complete 11/22/2015  Allergen Reaction Noted  . Atorvastatin Other (See Comments) 11/22/2015  . Aciphex [rabeprazole sodium] Hives 09/23/2011  . Erythromycin Nausea And Vomiting 09/23/2011  . Macrodantin Nausea And Vomiting 09/23/2011  . Nsaids  08/30/2014  . Omeprazole-sodium bicarbonate [omeprazole-sodium bicarbonate] Hives 09/23/2011  . Sulfa antibiotics Nausea Only and Other (See Comments) 09/23/2011  . Tape Rash 08/31/2014    Vitals: BP 122/78 mmHg  Pulse 72  Resp 20  Ht 5' 4.5" (1.638 m)  Wt 211 lb (95.709 kg)  BMI 35.67 kg/m2 Last Weight:  Wt Readings from Last 1 Encounters:  11/22/15 211 lb (95.709 kg)   ZDG:UYQI mass index is 35.67 kg/(m^2).     Last Height:   Ht Readings from Last 1 Encounters:  11/22/15 5' 4.5" (1.638 m)    Physical exam:  General: The patient is awake, alert and appears not in acute distress. The patient is well groomed. Head: Normocephalic, atraumatic. Neck is supple. Mallampati 3,  neck circumference: 15 . Nasal airflow unrestricted , TMJ click is evident . Retrognathia high grade.   Cardiovascular:  Regular rate and rhythm , without  murmurs or carotid bruit, and without distended neck veins. Respiratory: Lungs are clear to auscultation. Skin:  Without evidence of edema, or rash Trunk: BMI is elevated.The patient's posture is erect   Neurologic exam : The patient is awake and alert, oriented to place  and time.  She reports not having any desire to cook or eat, not to clean or iron. She appears depressed.  Memory subjective described as intact. MOCA 26-30 .  Attention span & concentration ability appears normal.  Speech is fluent, without dysarthria, dysphonia or aphasia. Mood and affect are appropriate. Cranial nerves: Pupils are equal and briskly reactive to light. Visual fields by finger perimetry are intact. Hearing to finger rub intact. Facial sensation intact to fine touch. Facial motor strength is symmetric and tongue and uvula move midline. Shoulder shrug was symmetrical. The patient has droopy shoulders.  Finger-to-nose maneuver  normal without evidence of ataxia, dysmetria or tremor. Patient walks without assistive device and is able unassisted to climb up to the exam table.  She walks arthralgic . Strength within normal limits. Stance is stable and normal. Toe and heel stand were tested.Tandem gait is unfragmented. Turns with 3 Steps. Romberg testing is negative.  Deep tendon reflexes: in the upper and lower extremities are symmetric and intact. Babinski maneuver response is  downgoing.  The patient was  advised of the nature of the diagnosed sleep disorder , the treatment options and risks for general a health and wellness arising from not treating the condition.  I spent more than 25 minutes of face to face time with the patient.  Greater than 50% of time was spent in counseling and coordination of care. We have discussed the diagnosis and differential and I answered the patient's questions.     Assessment:  After physical and neurologic examination, review of laboratory studies,  Personal review of imaging studies, reports of other /same  Imaging studies ,  Results of polysomnography/ neurophysiology testing and pre-existing records as far as provided in visit., my assessment is   1) Amanda [name] CPAP machine broke and I have repleced her  CPAP based on the November 2015 PSG and  Split. She is using the CPAP. 2) memory borderline, low normal MOCA score- she is not as attentive , easier distracted.  3) Amanda [name] is clinically depressed and this is understandable in her social and financial situation. 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 There is very little help from the 2 adult children. Especially no financial or emotional help. Given the high score of 13 out of 15 possible points on her geriatric depression score I think she is at this time under treated for depression also she has seen a counselor and psychiatrist 4)Bronchitis, with wheezing . Hoarseness. Coughing. Levoquin- and she will be able to resume the CPAP use.    Amanda Partridge Adonijah Baena MD  11/22/2015   CC: Katherina Mires, [address] [place], [place] [address]

## 2015-11-22 NOTE — Patient Instructions (Signed)
CPAP follow up in 12 month.

## 2016-05-24 ENCOUNTER — Other Ambulatory Visit: Payer: Self-pay | Admitting: Orthopedic Surgery

## 2016-05-24 DIAGNOSIS — M545 Low back pain: Secondary | ICD-10-CM

## 2016-06-01 ENCOUNTER — Ambulatory Visit
Admission: RE | Admit: 2016-06-01 | Discharge: 2016-06-01 | Disposition: A | Discharge status: Home / Self Care | Payer: Medicare Other | Source: Ambulatory Visit | Attending: Orthopedic Surgery | Admitting: Orthopedic Surgery

## 2016-06-01 DIAGNOSIS — M545 Low back pain: Secondary | ICD-10-CM

## 2016-07-15 ENCOUNTER — Other Ambulatory Visit: Payer: Self-pay | Admitting: Family Medicine

## 2016-07-15 DIAGNOSIS — Z1231 Encounter for screening mammogram for malignant neoplasm of breast: Secondary | ICD-10-CM

## 2016-07-28 ENCOUNTER — Ambulatory Visit: Payer: Medicare Other | Attending: Physical Medicine and Rehabilitation | Admitting: Physical Therapy

## 2016-07-28 ENCOUNTER — Encounter: Payer: Self-pay | Admitting: Physical Therapy

## 2016-07-28 DIAGNOSIS — R2689 Other abnormalities of gait and mobility: Secondary | ICD-10-CM | POA: Diagnosis present

## 2016-07-28 DIAGNOSIS — M6281 Muscle weakness (generalized): Secondary | ICD-10-CM

## 2016-07-28 DIAGNOSIS — M545 Low back pain, unspecified: Secondary | ICD-10-CM

## 2016-07-28 DIAGNOSIS — R531 Weakness: Secondary | ICD-10-CM | POA: Insufficient documentation

## 2016-07-28 DIAGNOSIS — R2681 Unsteadiness on feet: Secondary | ICD-10-CM | POA: Diagnosis present

## 2016-07-28 DIAGNOSIS — R42 Dizziness and giddiness: Secondary | ICD-10-CM | POA: Diagnosis present

## 2016-07-28 DIAGNOSIS — R269 Unspecified abnormalities of gait and mobility: Secondary | ICD-10-CM | POA: Diagnosis present

## 2016-07-28 NOTE — Therapy (Signed)
[facility] [address], [place], [place], [address] Phone: [phone]   Fax:  [phone]  Physical Therapy Evaluation  Patient Details  Name: Amanda [name] MRN: [id number] Date of Birth: 26-Sep-1946 Referring Provider: Dr. Magnus Sinning  Encounter Date: 07/28/2016      PT End of Session - 07/28/16 1132    Visit Number 1   Number of Visits 10   Date for PT Re-Evaluation 08/25/16   Authorization Type Medicare - G Codes   PT Start Time 1100   PT Stop Time 1138   PT Time Calculation (min) 38 min   Activity Tolerance Patient tolerated treatment well   Behavior During Therapy [facility] for tasks assessed/performed      Past Medical History:  Diagnosis Date  . Anemia due to blood loss   . Anxiety   . Arthritis   . Asthma    seasonal or with bronchitis  . Complication of anesthesia    "forgets to breathe" when she wakes up  . Depression   . Diabetes type 2, controlled ([place])    fasting 100-120  . GERD (gastroesophageal reflux disease)   . Headache   . Heart murmur   . Hepatitis    A -- as teenager  . Hyperlipidemia   . Hypertension   . Hypothyroidism   . Mitral valve prolapse   . OSA on CPAP   . Pneumonia    hx of  . Vertigo     Past Surgical History:  Procedure Laterality Date  . ABDOMINAL HYSTERECTOMY    . CHOLECYSTECTOMY    . FRACTURE SURGERY Right    leg  . GALLBLADDER SURGERY  1995  . JOINT REPLACEMENT Left    knee  . NASAL SINUS SURGERY  2005  . PARTIAL HYSTERECTOMY  1983  . THROAT SURGERY  2005  . TOTAL KNEE ARTHROPLASTY Right 09/12/2014   Procedure: TOTAL KNEE ARTHROPLASTY;  Surgeon: Meredith Pel, MD;  Location: [place] [facility];  Service: Orthopedics;  Laterality: Right;    There were no vitals filed for this visit.       Subjective Assessment - 07/28/16 1108    Subjective Patient reports 1 month ago she had a flare-up with back pain and had 4 injections into her back.    Pertinent  History goes by "[name]"; anxiety, depression, DM, thyroid disease, GERD, hyperlipidemia, vertigo, HTN, B TKE (L in 2005; R in 2015); B plantar fasciitis   Limitations Walking;House hold activities   Patient Stated Goals be more mobile and steady on her feet   Currently in Pain? Yes   Pain Score 10-Worst pain ever   Pain Location Back   Pain Orientation Lower   Pain Descriptors / Indicators Sharp;Aching   Pain Type Chronic pain   Pain Onset More than a month ago   Pain Frequency Intermittent   Aggravating Factors  bending; walking; turning over in bed; general movement   Pain Relieving Factors walk   Multiple Pain Sites No            [facility] PT Assessment - 07/28/16 0001      Assessment   Medical Diagnosis M47.816 low Back Pain   Referring Provider Dr. Magnus Sinning   Onset Date/Surgical Date 06/27/16   Prior Therapy none     Precautions   Precautions Fall     Restrictions   Weight Bearing Restrictions No     Balance Screen   Has the patient fallen in the past  6 months Yes   How many times? many   Has the patient had a decrease in activity level because of a fear of falling?  Yes   Is the patient reluctant to leave their home because of a fear of falling?  Yes     [place] Private residence   [place] to enter   Entrance Stairs-Number of Steps 2  small steps   Entrance Stairs-Rails None  pt holds onto post   Early - single point     Prior Function   Level of Independence Independent;Independent with gait;Independent with transfers   Vocation Retired     [employer]   Overall Cognitive Status Within Functional Limits for tasks assessed     ROM / Strength   AROM / PROM / Strength AROM;Strength     AROM   Lumbar Flexion no reverse curve in lumbar   Lumbar Extension decreased on right    Lumbar - Right Side Bend full   Lumbar - Left Side Bend full   Lumbar  - Right Rotation decreased by 25%   Lumbar - Left Rotation full     Strength   Overall Strength Comments bil. plantarflexion 3+/5   Right Hip Flexion 3+/5   Right Hip Extension 4/5   Right Hip ABduction 4+/5   Left Hip Flexion 3+/5   Left Hip Extension 4/5   Left Hip ABduction 4+/5     Ambulation/Gait   Ambulation/Gait Yes   Ambulation/Gait Assistance 5: Supervision   Gait Pattern Poor foot clearance - left;Poor foot clearance - right;Wide base of support;Lateral trunk lean to right;Lateral trunk lean to left     Standardized Balance Assessment   Five times sit to stand comments  24 sec     Berg Balance Test   Sit to Stand Able to stand  independently using hands   Standing Unsupported Able to stand safely 2 minutes   Sitting with Back Unsupported but Feet Supported on Floor or Stool Able to sit safely and securely 2 minutes   Stand to Sit Sits safely with minimal use of hands   Transfers Able to transfer safely, definite need of hands   Standing Unsupported with Eyes Closed Able to stand 10 seconds safely   Standing Ubsupported with Feet Together Able to place feet together independently and stand 1 minute safely   From Standing, Reach Forward with Outstretched Arm Can reach confidently >25 cm (10")   From Standing Position, Pick up Object from Floor Able to pick up shoe safely and easily   From Standing Position, Turn to Look Behind Over each Shoulder Looks behind from both sides and weight shifts well   Turn 360 Degrees Able to turn 360 degrees safely in 4 seconds or less   Standing Unsupported, Alternately Place Feet on Step/Stool Able to complete >2 steps/needs minimal assist   Standing Unsupported, One Foot in Front Able to take small step independently and hold 30 seconds   Standing on One Leg Unable to try or needs assist to prevent fall   Total Score 45   Berg comment: 80% chance of falling                           PT Education - 07/28/16 1138     Education provided Yes   Education Details instructed patient to purchase a cane  due to her high risk of falls.    Person(s) Educated Patient   Methods Explanation   Comprehension Verbalized understanding          PT Short Term Goals - 06/27/15 1654      PT SHORT TERM GOAL #1   Title Pt will independently perform home exercises to indicate safe daily compliance with HEP. Target date: 07/11/15   Baseline HEP issued 8/3; gaze stabilization exercises initiated 8/10.   Status On-going     PT SHORT TERM GOAL #2   Title Pt will ambulate at self-selected gait speed of 2.63 ft/sec to indicate status of community ambulator. Target date: 07/11/15   Status On-going     PT SHORT TERM GOAL #3   Title Pt will perform 5 Times Sit-to-Stand Test in <15 seconds without UE support to indicate decreased risk of recurrent falls. Target date: 07/11/15   Status On-going     PT SHORT TERM GOAL #4   Title Perform DGI to assess gait stability in presence of external demands. Target date: 07/11/15   Baseline 06/20/15: baseline established with score of 15/24   Status Achieved           PT Long Term Goals - 07/28/16 1128      PT LONG TERM GOAL #1   Title Pt will verbalize understanding of fall prevention strategies to decrease risk of falls within home.    Time 4   Status New     PT LONG TERM GOAL #2   Title Berg balance score >/= 50/56   Time 4   Status New     PT LONG TERM GOAL #3   Title moving in bed with pain minimal due to increased lumbar mobility   Time 4   Period Weeks   Status New     PT LONG TERM GOAL #4   Title report minimal pain with daily activites in lumbar spine due to increased strength   Time 4   Status New     PT LONG TERM GOAL #5   Title FOTO score </56% limitation   Time 4   Period Weeks   Status New     PT LONG TERM GOAL #6   Title independent with HEP with spinal neutral   Time 4   Period Weeks   Status New               Plan - 07/28/16 1139     Clinical Impression Statement Patient is a 70 year female with diagnosis of lumbar pain with recent flare-up 4 weeks ago.  Patient had 4 shots into her lumbar.  Pateint reports frequent falls. Berg balance score is 45/56 indicating 80% chance of falls.  Sit to stand is 25 seconds.  Patient has bilateral hip weakness  and bil. plantarflcion strength is 3+/5.  Patient has lumbar pain at level 9/10 intermittently with movement and turning in bed.  Patient has decreased mobility of right side of lumbar vertebrae.  Patient is moderately complex evaltuation due to an devolving condition and comobidities such as bil. knee replacement, diabetes, and history of cerbral palsy that will impact care provided. Patient wil lbenefit form skilled therpay to reduce pain and improve mobility.    Rehab Potential Good   Clinical Impairments Affecting Rehab Potential None   PT Frequency 2x / week   PT Duration 4 weeks   PT Treatment/Interventions ADLs/Self Care Home Management;Cryotherapy;Electrical Stimulation;Gait training;Ultrasound;Traction;Therapeutic activities;Therapeutic exercise;Balance training;Neuromuscular re-education;Patient/family education;Passive range of motion;Manual techniques;Energy conservation  PT Next Visit Plan tips to avoid falls, exercise in spinal neutral only as per MD,balance exercises, see if she got a cane, hip and plantarflexion strength; MD wants mechanical manipulation, neutral spine core stabilization and moblity exercise   PT Home Exercise Plan balance exercises   Recommended Other Services None   Consulted and Agree with Plan of Care Patient      Patient will benefit from skilled therapeutic intervention in order to improve the following deficits and impairments:  Abnormal gait, Decreased range of motion, Difficulty walking, Increased fascial restricitons, Increased muscle spasms, Decreased endurance, Decreased activity tolerance, Pain, Decreased balance, Decreased strength, Decreased  mobility  Visit Diagnosis: Muscle weakness (generalized) - Plan: PT plan of care cert/re-cert  Other abnormalities of gait and mobility - Plan: PT plan of care cert/re-cert  Midline low back pain without sciatica - Plan: PT plan of care cert/re-cert      G-Codes - [id number]    Functional Assessment Tool Used fOTO score is 56% limitation   Functional Limitation Other PT primary   Mobility: Walking and Moving Around Current Status [id number]) At least 40 percent but less than 60 percent impaired, limited or restricted   Mobility: Walking and Moving Around Goal Status [id number]) At least 40 percent but less than 60 percent impaired, limited or restricted       Problem List Patient Active Problem List   Diagnosis Date Noted  . Acute bronchitis due to Streptococcus 11/22/2015  . OSA on CPAP 11/22/2015  . Depression (emotion) 11/22/2015  . Abnormal CT scan   . Hematoma of left psoas region to anticoagulant therapy 10/11/2014  . Coagulopathy ([place]) 10/11/2014  . Psoas hematoma, left, secondary to anticoagulant therapy 10/11/2014  . Acute hyponatremia 10/11/2014  . S/P total knee arthroplasty 09/24/2014  . Hypertension   . GERD (gastroesophageal reflux disease)   . Diabetes type 2, controlled ([place])   . Depression   . Hyperlipidemia   . 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 Cough 08/31/2013    Earlie Counts, PT 07/28/16 12:51 PM    [facility] [address] [place], [place], [address] Phone: [phone]   Fax:  [phone]  Name: Amanda [name] MRN: [id number] Date of Birth: [birth year]

## 2016-07-29 IMAGING — CT CT HIP*L* W/O CM
2 of 5 series · 7 of 46 positions shown, 8 images · non-contrast
Comparison: None.

CLINICAL DATA: Left hip pain since the weekend.

EXAM:
CT OF THE LEFT HIP WITHOUT CONTRAST
TECHNIQUE: Multidetector CT imaging of the left hip was performed according to
the standard protocol. Multiplanar CT image reconstructions were
also generated.

[Series 202: soft tissue · axial · 0.29mm/px · z∈[-164,-50]mm · 4 of 79 slices shown, 5 images]
[im 11/79  soft-tissue]
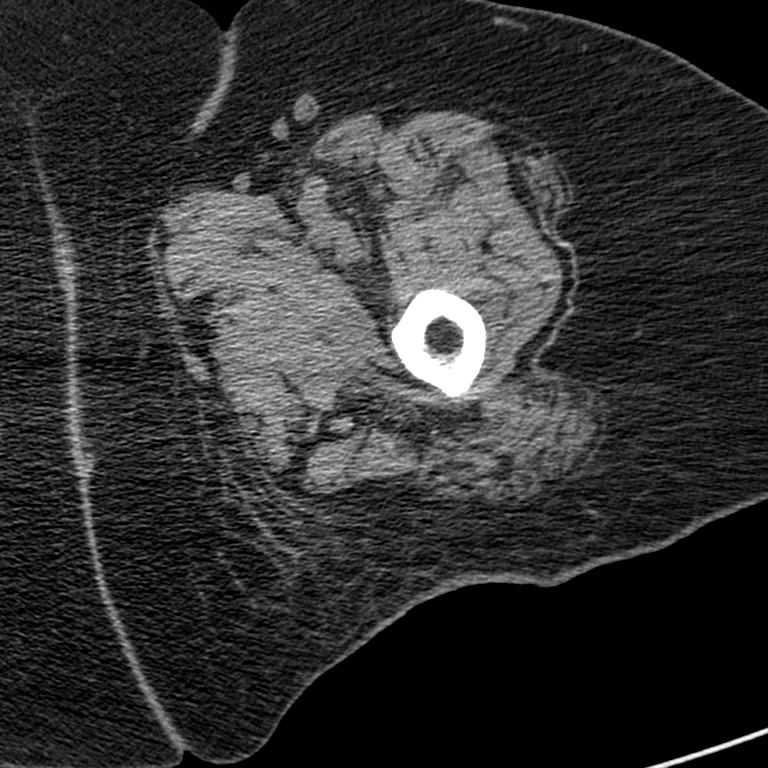
[im 11/79  bone]
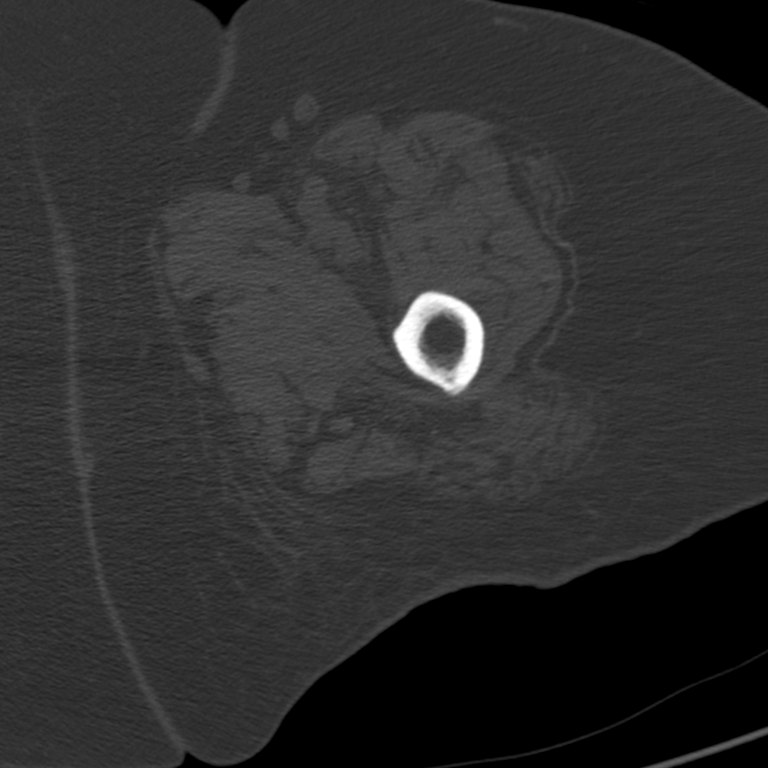
[im 31/79  soft-tissue]
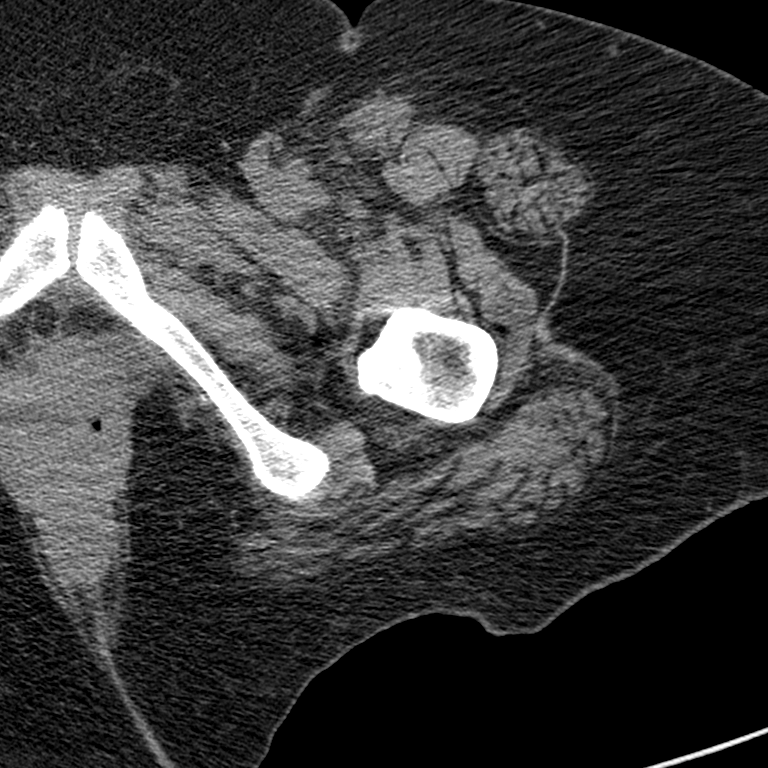
[im 48/79  soft-tissue]
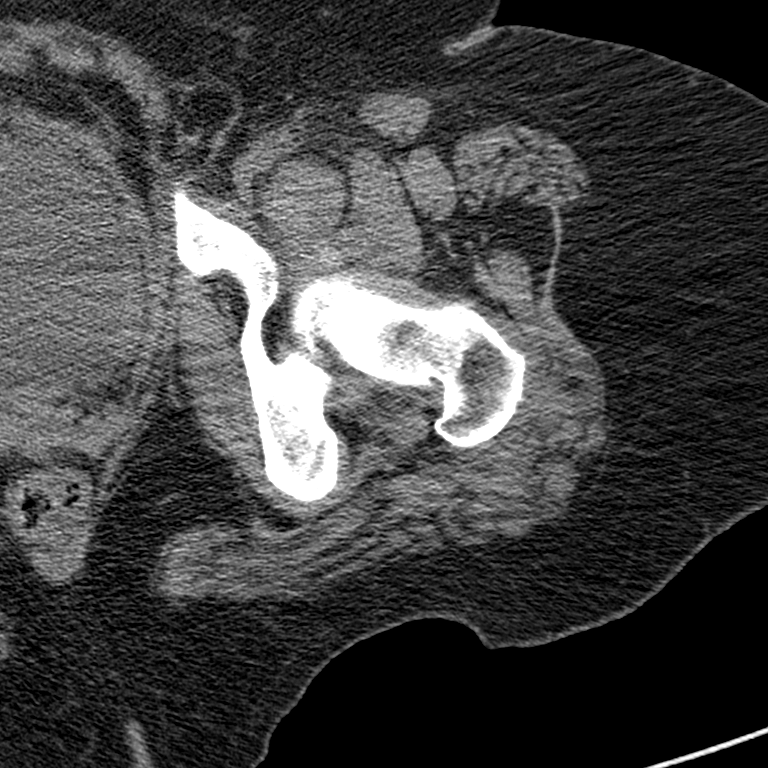
[im 68/79  soft-tissue]
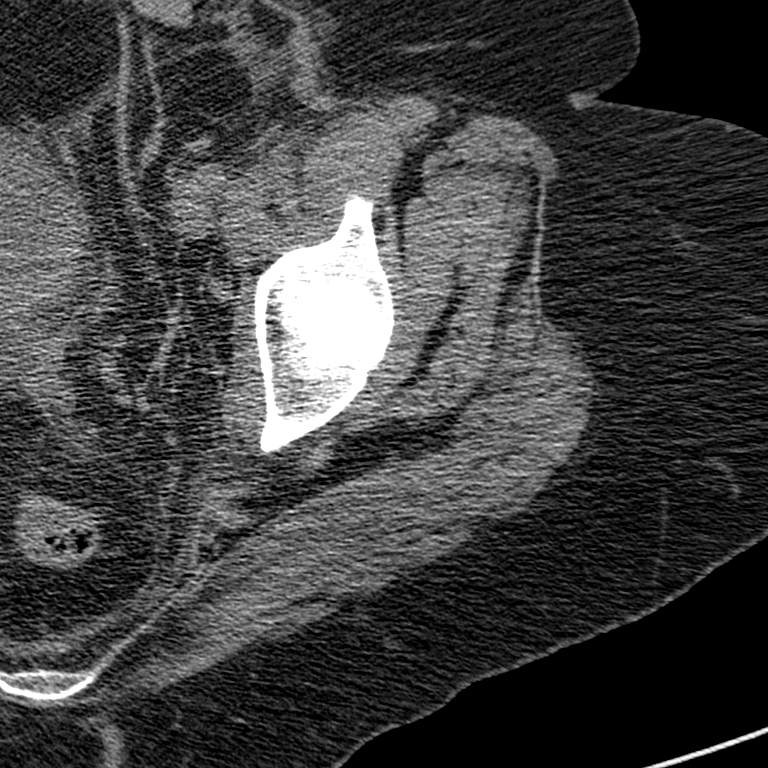

[Series 205: cor · coronal · 0.29mm/px · 3 of 60 slices shown]
[im 12/60  soft-tissue]
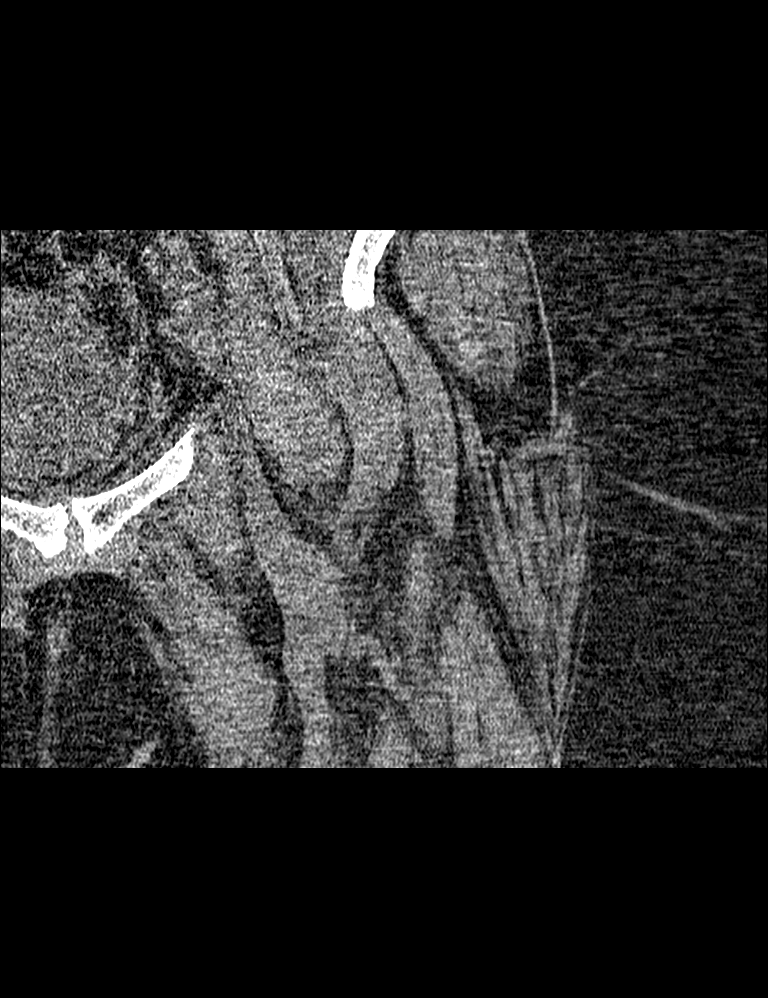
[im 24/60  soft-tissue]
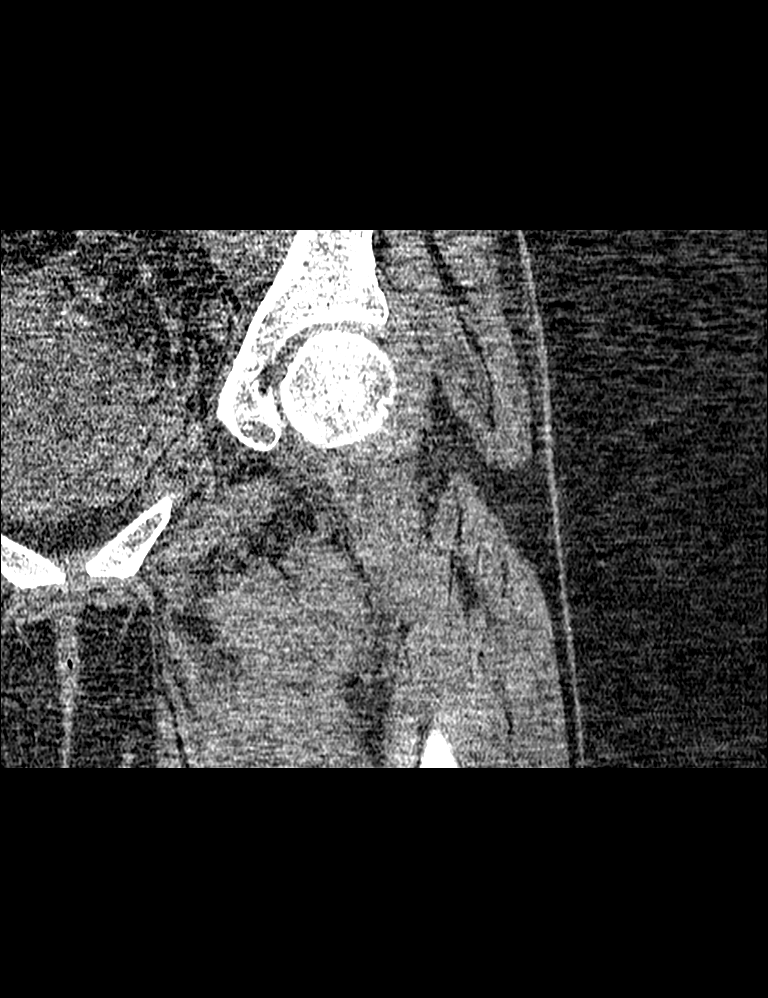
[im 36/60  soft-tissue]
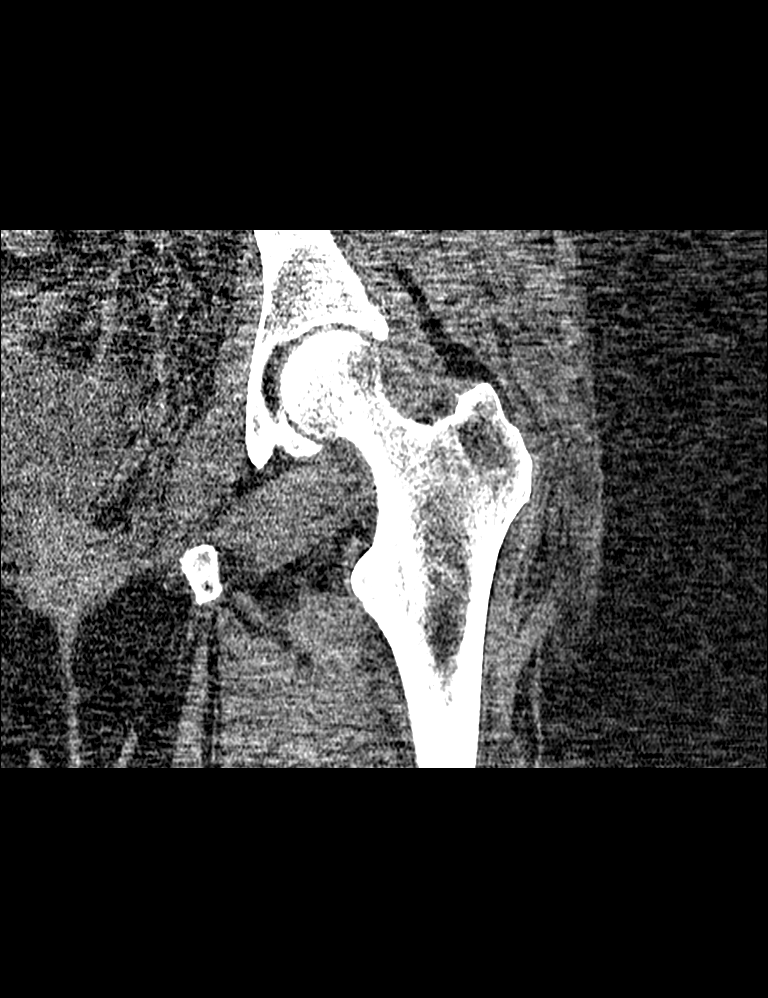

[7 of 46 positions shown; findings below may reference images not displayed]

FINDINGS: No acute left hip fracture, dislocation or avascular necrosis. No
lytic or sclerotic osseous lesion.

No focal fluid collection or hematoma. The muscles are normal. No
subcutaneous emphysema.

There is a small amount of high attenuation fluid and stranding in
the visualized portion of the left side of the pelvis concerning for
hemorrhage.
IMPRESSION: 1. No acute osseous injury of the left hip.
2. Small amount of hemorrhage is identified in the left side of the
pelvis from on certain etiology. A dedicated CT of the abdomen and
pelvis is recommended.
These results were called by telephone at the time of interpretation
on 10/11/2014 at [DATE] to ALTENHOVEN BARZON , who verbally
acknowledged these results.

## 2016-07-31 ENCOUNTER — Ambulatory Visit: Payer: Medicare Other | Admitting: Physical Therapy

## 2016-07-31 DIAGNOSIS — M545 Low back pain, unspecified: Secondary | ICD-10-CM

## 2016-07-31 DIAGNOSIS — R2689 Other abnormalities of gait and mobility: Secondary | ICD-10-CM

## 2016-07-31 DIAGNOSIS — M6281 Muscle weakness (generalized): Secondary | ICD-10-CM | POA: Diagnosis not present

## 2016-07-31 NOTE — Therapy (Signed)
[facility] [address] [place], [place], [address] Phone: [phone]   Fax:  [phone]  Physical Therapy Treatment  Patient Details  Name: Amanda [name] MRN: [id number] Date of Birth: [birth year] Referring Provider: Dr. Magnus Sinning  Encounter Date: 07/31/2016      PT End of Session - 07/31/16 1648    Visit Number 2   Number of Visits 10   Date for PT Re-Evaluation 08/25/16   Authorization Type Medicare - G Codes   PT Start Time March 09, 1627   PT Stop Time 1706   PT Time Calculation (min) 38 min      Past Medical History:  Diagnosis Date  . Anemia due to blood loss   . Anxiety   . Arthritis   . Asthma    seasonal or with bronchitis  . Complication of anesthesia    "forgets to breathe" when she wakes up  . Depression   . Diabetes type 2, controlled ([place])    fasting 100-120  . GERD (gastroesophageal reflux disease)   . Headache   . Heart murmur   . Hepatitis    A -- as teenager  . Hyperlipidemia   . Hypertension   . Hypothyroidism   . Mitral valve prolapse   . OSA on CPAP   . Pneumonia    hx of  . Vertigo     Past Surgical History:  Procedure Laterality Date  . ABDOMINAL HYSTERECTOMY    . CHOLECYSTECTOMY    . FRACTURE SURGERY Right    leg  . GALLBLADDER SURGERY  1995  . JOINT REPLACEMENT Left    knee  . NASAL SINUS SURGERY  03/08/04  . PARTIAL HYSTERECTOMY  1983  . THROAT SURGERY  08-Mar-2004  . TOTAL KNEE ARTHROPLASTY Right 09/12/2014   Procedure: TOTAL KNEE ARTHROPLASTY;  Surgeon: Meredith Pel, MD;  Location: [facility];  Service: Orthopedics;  Laterality: Right;    There were no vitals filed for this visit.      Subjective Assessment - 07/31/16 1628    Subjective Patient arrives 13 min late.  Patient locked herself out of the house so appt rescheduled from earlier time.  Forgot her cane.     Pertinent History goes by "[name]"; anxiety, depression, DM, thyroid disease, GERD, hyperlipidemia,  vertigo, HTN, B TKE (L in 08-Mar-2004; R in 03-08-2014); B plantar fasciitis   Currently in Pain? No/denies   Pain Location Back   Pain Orientation Lower   Pain Type Chronic pain                         [facility] Adult PT Treatment/Exercise - 07/31/16 0001      Lumbar Exercises: Seated   Other Seated Lumbar Exercises 2# plyo ball hip to hip, hip swings, chops and ear to ear 1 min each for core activation     Lumbar Exercises: Supine   Ab Set 10 reps   Isometric Hip Flexion 10 reps   Other Supine Lumbar Exercises abdom brace with ball squeeze 10x   Other Supine Lumbar Exercises abdom squeeze with red band abduction 10x     Knee/Hip Exercises: Aerobic   Nustep Nu-Step L 1 8 min     Knee/Hip Exercises: Seated   Sit to Sand 10 reps;without UE support                PT Education - 07/31/16 1646    Education provided Yes   Education  Details supine abdominal brace and hand to knee push   Person(s) Educated Patient   Methods Explanation;Demonstration;Handout   Comprehension Verbalized understanding;Returned demonstration             PT Long Term Goals - 07/31/16 1702      PT LONG TERM GOAL #1   Title Pt will verbalize understanding of fall prevention strategies to decrease risk of falls within home.    Time 4   Period Weeks   Status On-going     PT LONG TERM GOAL #2   Title Berg balance score >/= 50/56   Time 4   Period Weeks   Status On-going     PT LONG TERM GOAL #3   Title moving in bed with pain minimal due to increased lumbar mobility   Time 4   Period Weeks   Status On-going     PT LONG TERM GOAL #4   Title report minimal pain with daily activites in lumbar spine due to increased strength   Time 4   Status On-going     PT LONG TERM GOAL #5   Title FOTO score </56% limitation   Time 4   Period Weeks   Status On-going     PT LONG TERM GOAL #6   Title independent with HEP with spinal neutral   Time 4   Period Weeks   Status On-going                Plan - 07/31/16 1653    Clinical Impression Statement The patient is able to activate transverse abdominals needed for neutral spine stabilization with moderate verbal and tactile cues.  She needs lumbar roll in supine for support/pain relief.  Able to participate low level core strengthening without exacerbation of pain.  Therapist closely monitoring pain and modifying as needed.     PT Next Visit Plan tips to avoid falls, exercise in spinal neutral only as per MD,balance exercises; hip and plantarflexion strength;  neutral spine core stabilization       Patient will benefit from skilled therapeutic intervention in order to improve the following deficits and impairments:     Visit Diagnosis: Muscle weakness (generalized)  Other abnormalities of gait and mobility  Midline low back pain without sciatica     Problem List Patient Active Problem List   Diagnosis Date Noted  . Acute bronchitis due to Streptococcus 11/22/2015  . OSA on CPAP 11/22/2015  . Depression (emotion) 11/22/2015  . Abnormal CT scan   . Hematoma of left psoas region to anticoagulant therapy 10/11/2014  . Coagulopathy ([place]) 10/11/2014  . Psoas hematoma, left, secondary to anticoagulant therapy 10/11/2014  . Acute hyponatremia 10/11/2014  . S/P total knee arthroplasty 09/24/2014  . Hypertension   . GERD (gastroesophageal reflux disease)   . Diabetes type 2, controlled ([place])   . Depression   . Hyperlipidemia   . 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 Cough 08/31/2013   Ruben Im, PT 07/31/16 5:09 PM Phone: [phone] Fax: [phone]  Alvera Singh 07/31/2016, 5:08 PM  [facility] [address] [place], [place], [address] Phone: [phone]   Fax:  [phone]  Name: NELLE MOUSTAFA MRN: [id number] Date of Birth: 1946-08-16

## 2016-07-31 NOTE — Patient Instructions (Signed)
Amanda [name] PT [facility] [address], [address] [place], [place] [address] Phone # [phone] Fax [phone]    

## 2016-08-04 ENCOUNTER — Ambulatory Visit: Payer: Medicare Other | Admitting: Physical Therapy

## 2016-08-04 ENCOUNTER — Encounter: Payer: Self-pay | Admitting: Physical Therapy

## 2016-08-04 DIAGNOSIS — R269 Unspecified abnormalities of gait and mobility: Secondary | ICD-10-CM

## 2016-08-04 DIAGNOSIS — M6281 Muscle weakness (generalized): Secondary | ICD-10-CM

## 2016-08-04 DIAGNOSIS — M545 Low back pain, unspecified: Secondary | ICD-10-CM

## 2016-08-04 DIAGNOSIS — R531 Weakness: Secondary | ICD-10-CM

## 2016-08-04 DIAGNOSIS — R2689 Other abnormalities of gait and mobility: Secondary | ICD-10-CM

## 2016-08-04 DIAGNOSIS — R42 Dizziness and giddiness: Secondary | ICD-10-CM

## 2016-08-04 DIAGNOSIS — R2681 Unsteadiness on feet: Secondary | ICD-10-CM

## 2016-08-04 NOTE — Therapy (Signed)
[facility] [address], [place], [place], [address] Phone: [phone]   Fax:  [phone]  Physical Therapy Treatment  Patient Details  Name: Amanda [name] MRN: [id number] Date of Birth: 1946-07-10 Referring Provider: Dr. Magnus Sinning  Encounter Date: 08/04/2016      PT End of Session - 08/04/16 1653    Visit Number 3   Number of Visits 10   Date for PT Re-Evaluation 08/25/16   Authorization Type Medicare - G Codes   PT Start Time 1100   PT Stop Time 1143   PT Time Calculation (min) 43 min   Activity Tolerance Patient tolerated treatment well   Behavior During Therapy [facility] for tasks assessed/performed      Past Medical History:  Diagnosis Date  . Anemia due to blood loss   . Anxiety   . Arthritis   . Asthma    seasonal or with bronchitis  . Complication of anesthesia    "forgets to breathe" when she wakes up  . Depression   . Diabetes type 2, controlled ([place])    fasting 100-120  . GERD (gastroesophageal reflux disease)   . Headache   . Heart murmur   . Hepatitis    A -- as teenager  . Hyperlipidemia   . Hypertension   . Hypothyroidism   . Mitral valve prolapse   . OSA on CPAP   . Pneumonia    hx of  . Vertigo     Past Surgical History:  Procedure Laterality Date  . ABDOMINAL HYSTERECTOMY    . CHOLECYSTECTOMY    . FRACTURE SURGERY Right    leg  . GALLBLADDER SURGERY  1995  . JOINT REPLACEMENT Left    knee  . NASAL SINUS SURGERY  2005  . PARTIAL HYSTERECTOMY  1983  . THROAT SURGERY  2005  . TOTAL KNEE ARTHROPLASTY Right 09/12/2014   Procedure: TOTAL KNEE ARTHROPLASTY;  Surgeon: Meredith Pel, MD;  Location: [place];  Service: Orthopedics;  Laterality: Right;    There were no vitals filed for this visit.      Subjective Assessment - 08/04/16 1104    Subjective Pt reports back feeling much better. Left therpay and went to do errands and felt good. Denies any pain at hte  moment.    Limitations Walking;House hold activities   Currently in Pain? No/denies                         [facility] Adult PT Treatment/Exercise - 08/04/16 0001      Exercises   Exercises Knee/Hip     Lumbar Exercises: Seated   Other Seated Lumbar Exercises 2# plyo ball hip to hip, hip swings, chops and ear to ear 1 min each for core activation     Lumbar Exercises: Supine   Bent Knee Raise 20 reps   Isometric Hip Flexion 10 reps   Other Supine Lumbar Exercises abdom brace with ball squeeze 10x   Other Supine Lumbar Exercises abdom squeeze with red band abduction 10x     Knee/Hip Exercises: Aerobic   Nustep Nu-Step L 1 9 min  While therapist assesing pain     Knee/Hip Exercises: Seated   Sit to Sand 10 reps;without UE support                PT Education - 08/04/16 1636    Education provided Yes   Education Details Fall prevention   Person(s) Educated  Patient   Methods Explanation;Handout   Comprehension Verbalized understanding          PT Short Term Goals - 08/04/16 1106      PT SHORT TERM GOAL #1   Title Pt will independently perform home exercises to indicate safe daily compliance with HEP. Target date: 07/11/15   Baseline HEP issued 8/3; gaze stabilization exercises initiated 8/10.   Status On-going     PT SHORT TERM GOAL #2   Title Pt will ambulate at self-selected gait speed of 2.63 ft/sec to indicate status of community ambulator. Target date: 07/11/15   Status On-going     PT SHORT TERM GOAL #3   Title Pt will perform 5 Times Sit-to-Stand Test in <15 seconds without UE support to indicate decreased risk of recurrent falls. Target date: 07/11/15   Status On-going     PT SHORT TERM GOAL #4   Title Perform DGI to assess gait stability in presence of external demands. Target date: 07/11/15   Baseline 06/20/15: baseline established with score of 15/24   Status Achieved           PT Long Term Goals - 08/04/16 1106      PT LONG TERM  GOAL #1   Title Pt will verbalize understanding of fall prevention strategies to decrease risk of falls within home.    Time 4   Period Weeks   Status On-going     PT LONG TERM GOAL #2   Title Berg balance score >/= 50/56   Time 4   Period Weeks   Status On-going     PT LONG TERM GOAL #3   Title moving in bed with pain minimal due to increased lumbar mobility   Time 4   Period Weeks   Status On-going     PT LONG TERM GOAL #4   Title report minimal pain with daily activites in lumbar spine due to increased strength   Time 4   Status On-going     PT LONG TERM GOAL #5   Title FOTO score </56% limitation   Time 4   Period Weeks   Status On-going     PT LONG TERM GOAL #6   Title independent with HEP with spinal neutral   Time 4   Period Weeks   Status On-going               Plan - 08/04/16 1633    Clinical Impression Statement Pt educated on fall prevention around home. Pt able to complete all exrecises well with verbal cues for proper posture and positioning. Will continue to increase core and LE stability while maintaining neutral spine per MD.    Rehab Potential Good   Clinical Impairments Affecting Rehab Potential None   PT Frequency 2x / week   PT Duration 4 weeks   PT Treatment/Interventions ADLs/Self Care Home Management;Cryotherapy;Electrical Stimulation;Gait training;Ultrasound;Traction;Therapeutic activities;Therapeutic exercise;Balance training;Neuromuscular re-education;Patient/family education;Passive range of motion;Manual techniques;Energy conservation   PT Next Visit Plan exercise in spinal neutral only as per MD,balance exercises; hip and plantarflexion strength;  neutral spine core stabilization    PT Home Exercise Plan fall prevention   Consulted and Agree with Plan of Care Patient      Patient will benefit from skilled therapeutic intervention in order to improve the following deficits and impairments:  Abnormal gait, Decreased range of motion,  Difficulty walking, Increased fascial restricitons, Increased muscle spasms, Decreased endurance, Decreased activity tolerance, Pain, Decreased balance, Decreased strength, Decreased mobility  Visit Diagnosis:  Muscle weakness (generalized)  Other abnormalities of gait and mobility  Midline low back pain without sciatica  Abnormality of gait  Weakness generalized  Dizziness and giddiness  Unsteadiness     Problem List Patient Active Problem List   Diagnosis Date Noted  . Acute bronchitis due to Streptococcus 11/22/2015  . OSA on CPAP 11/22/2015  . Depression (emotion) 11/22/2015  . Abnormal CT scan   . Hematoma of left psoas region to anticoagulant therapy 10/11/2014  . Coagulopathy ([place]) 10/11/2014  . Psoas hematoma, left, secondary to anticoagulant therapy 10/11/2014  . Acute hyponatremia 10/11/2014  . S/P total knee arthroplasty 09/24/2014  . Hypertension   . GERD (gastroesophageal reflux disease)   . Diabetes type 2, controlled ([place])   . Depression   . Hyperlipidemia   . 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 Cough 08/31/2013    Mikle Bosworth PTA 08/04/2016, 4:58 PM  [facility] [address] [place], [place], [address] Phone: [phone]   Fax:  [phone]  Name: JAMELL KOOPMAN MRN: [id number] Date of Birth: 11/29/1945

## 2016-08-04 NOTE — Patient Instructions (Signed)

## 2016-08-07 ENCOUNTER — Encounter: Payer: Self-pay | Admitting: Physical Therapy

## 2016-08-07 ENCOUNTER — Ambulatory Visit: Payer: Medicare Other | Admitting: Physical Therapy

## 2016-08-07 DIAGNOSIS — R269 Unspecified abnormalities of gait and mobility: Secondary | ICD-10-CM

## 2016-08-07 DIAGNOSIS — R2689 Other abnormalities of gait and mobility: Secondary | ICD-10-CM

## 2016-08-07 DIAGNOSIS — M6281 Muscle weakness (generalized): Secondary | ICD-10-CM | POA: Diagnosis not present

## 2016-08-07 DIAGNOSIS — M545 Low back pain, unspecified: Secondary | ICD-10-CM

## 2016-08-07 DIAGNOSIS — R2681 Unsteadiness on feet: Secondary | ICD-10-CM

## 2016-08-07 DIAGNOSIS — R531 Weakness: Secondary | ICD-10-CM

## 2016-08-07 DIAGNOSIS — R42 Dizziness and giddiness: Secondary | ICD-10-CM

## 2016-08-07 NOTE — Therapy (Signed)
[facility] [address], [place], [place], [address] Phone: [phone]   Fax:  [phone]  Physical Therapy Treatment  Patient Details  Name: Amanda [name] MRN: [id number] Date of Birth: July 21, 1946 Referring Provider: Dr. Magnus Sinning  Encounter Date: 08/07/2016      PT End of Session - 08/07/16 1615    Visit Number 4   Number of Visits 10   Date for PT Re-Evaluation 08/25/16   Authorization Type Medicare - G Codes   PT Start Time [id number]   PT Stop Time 1700   PT Time Calculation (min) 55 min   Activity Tolerance Patient tolerated treatment well   Behavior During Therapy [facility] for tasks assessed/performed      Past Medical History:  Diagnosis Date  . Anemia due to blood loss   . Anxiety   . Arthritis   . Asthma    seasonal or with bronchitis  . Complication of anesthesia    "forgets to breathe" when she wakes up  . Depression   . Diabetes type 2, controlled ([place])    fasting 100-120  . GERD (gastroesophageal reflux disease)   . Headache   . Heart murmur   . Hepatitis    A -- as teenager  . Hyperlipidemia   . Hypertension   . Hypothyroidism   . Mitral valve prolapse   . OSA on CPAP   . Pneumonia    hx of  . Vertigo     Past Surgical History:  Procedure Laterality Date  . ABDOMINAL HYSTERECTOMY    . CHOLECYSTECTOMY    . FRACTURE SURGERY Right    leg  . GALLBLADDER SURGERY  1995  . JOINT REPLACEMENT Left    knee  . NASAL SINUS SURGERY  2005  . PARTIAL HYSTERECTOMY  1983  . THROAT SURGERY  2005  . TOTAL KNEE ARTHROPLASTY Right 09/12/2014   Procedure: TOTAL KNEE ARTHROPLASTY;  Surgeon: Meredith Pel, MD;  Location: [place];  Service: Orthopedics;  Laterality: Right;    There were no vitals filed for this visit.      Subjective Assessment - 08/07/16 1612    Subjective Pt reports feeling very tiered today. Walked a lot yesterday which aggrivated back.    Pertinent History goes by  "Amanda [name]"; anxiety, depression, DM, thyroid disease, GERD, hyperlipidemia, vertigo, HTN, B TKE (L in 2005; R in 2015); B plantar fasciitis   Currently in Pain? Yes   Pain Score 2    Pain Location Back   Pain Orientation Lower   Pain Descriptors / Indicators Aching;Sharp   Pain Type Chronic pain                         [facility] Adult PT Treatment/Exercise - 08/07/16 0001      Lumbar Exercises: Supine   Ab Set 10 reps  With ball rolling   Bent Knee Raise 20 reps   Bridge 10 reps   Other Supine Lumbar Exercises abdom brace with ball squeeze 10x   Other Supine Lumbar Exercises abdom squeeze with red band abduction 10x     Knee/Hip Exercises: Aerobic   Nustep Nu-Step L 1 9 min  While therapist assesing pain     Knee/Hip Exercises: Standing   Heel Raises Both;2 sets;10 reps   Hip Flexion Stengthening;Both;2 sets   Hip Abduction Both;Stengthening;2 sets;10 reps   Hip Extension Stengthening;Both;2 sets;10 reps     Knee/Hip Exercises: Seated   Sit  to [employer] 10 reps;without UE support     Modalities   Modalities Social worker Location low back   Electrical Stimulation Action IFC   Electrical Stimulation Parameters To tolerance   Electrical Stimulation Goals Pain                  PT Short Term Goals - 08/04/16 1106      PT SHORT TERM GOAL #1   Title Pt will independently perform home exercises to indicate safe daily compliance with HEP. Target date: 07/11/15   Baseline HEP issued 8/3; gaze stabilization exercises initiated 8/10.   Status On-going     PT SHORT TERM GOAL #2   Title Pt will ambulate at self-selected gait speed of 2.63 ft/sec to indicate status of community ambulator. Target date: 07/11/15   Status On-going     PT SHORT TERM GOAL #3   Title Pt will perform 5 Times Sit-to-Stand Test in <15 seconds without UE support to indicate decreased risk of recurrent falls. Target date: 07/11/15    Status On-going     PT SHORT TERM GOAL #4   Title Perform DGI to assess gait stability in presence of external demands. Target date: 07/11/15   Baseline 06/20/15: baseline established with score of 15/24   Status Achieved           PT Long Term Goals - 08/04/16 1106      PT LONG TERM GOAL #1   Title Pt will verbalize understanding of fall prevention strategies to decrease risk of falls within home.    Time 4   Period Weeks   Status On-going     PT LONG TERM GOAL #2   Title Berg balance score >/= 50/56   Time 4   Period Weeks   Status On-going     PT LONG TERM GOAL #3   Title moving in bed with pain minimal due to increased lumbar mobility   Time 4   Period Weeks   Status On-going     PT LONG TERM GOAL #4   Title report minimal pain with daily activites in lumbar spine due to increased strength   Time 4   Status On-going     PT LONG TERM GOAL #5   Title FOTO score </56% limitation   Time 4   Period Weeks   Status On-going     PT LONG TERM GOAL #6   Title independent with HEP with spinal neutral   Time 4   Period Weeks   Status On-going               Plan - 08/07/16 1616    Clinical Impression Statement Pt able to tolerate all standing exercises well. Stand by assist from therapist for safety as pt occasionally loses balance. Verbal cueing throughout treatment for abdominal bracing. Pt able to complete all exercises with no increase in pain. Will continue to increase core strnegth and stability.    Rehab Potential Good   Clinical Impairments Affecting Rehab Potential None   PT Frequency 2x / week   PT Duration 4 weeks   PT Treatment/Interventions ADLs/Self Care Home Management;Cryotherapy;Electrical Stimulation;Gait training;Ultrasound;Traction;Therapeutic activities;Therapeutic exercise;Balance training;Neuromuscular re-education;Patient/family education;Passive range of motion;Manual techniques;Energy conservation   PT Next Visit Plan exercise in spinal  neutral only as per MD,balance exercises; hip and plantarflexion strength;  neutral spine core stabilization    Consulted and Agree with Plan of Care Patient  Patient will benefit from skilled therapeutic intervention in order to improve the following deficits and impairments:  Abnormal gait, Decreased range of motion, Difficulty walking, Increased fascial restricitons, Increased muscle spasms, Decreased endurance, Decreased activity tolerance, Pain, Decreased balance, Decreased strength, Decreased mobility  Visit Diagnosis: Muscle weakness (generalized)  Other abnormalities of gait and mobility  Midline low back pain without sciatica  Abnormality of gait  Weakness generalized  Dizziness and giddiness  Unsteadiness     Problem List Patient Active Problem List   Diagnosis Date Noted  . Acute bronchitis due to Streptococcus 11/22/2015  . OSA on CPAP 11/22/2015  . Depression (emotion) 11/22/2015  . Abnormal CT scan   . Hematoma of left psoas region to anticoagulant therapy 10/11/2014  . Coagulopathy ([place]) 10/11/2014  . Psoas hematoma, left, secondary to anticoagulant therapy 10/11/2014  . Acute hyponatremia 10/11/2014  . S/P total knee arthroplasty 09/24/2014  . Hypertension   . GERD (gastroesophageal reflux disease)   . Diabetes type 2, controlled ([place])   . Depression   . Hyperlipidemia   . 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 Cough 08/31/2013    Mikle Bosworth PTA 08/07/2016, 4:57 PM  [facility] [address], [place], [place], [address] Phone: [phone]   Fax:  [phone]  Name: Amanda [name] MRN: [id number] Date of Birth: 1946-03-14

## 2016-08-11 ENCOUNTER — Encounter: Payer: Medicare Other | Admitting: Physical Therapy

## 2016-08-14 ENCOUNTER — Encounter: Payer: Self-pay | Admitting: Physical Therapy

## 2016-08-14 ENCOUNTER — Ambulatory Visit: Payer: Medicare Other | Admitting: Physical Therapy

## 2016-08-14 DIAGNOSIS — R2681 Unsteadiness on feet: Secondary | ICD-10-CM

## 2016-08-14 DIAGNOSIS — R2689 Other abnormalities of gait and mobility: Secondary | ICD-10-CM

## 2016-08-14 DIAGNOSIS — R269 Unspecified abnormalities of gait and mobility: Secondary | ICD-10-CM

## 2016-08-14 DIAGNOSIS — R531 Weakness: Secondary | ICD-10-CM

## 2016-08-14 DIAGNOSIS — M6281 Muscle weakness (generalized): Secondary | ICD-10-CM | POA: Diagnosis not present

## 2016-08-14 DIAGNOSIS — M545 Low back pain, unspecified: Secondary | ICD-10-CM

## 2016-08-14 DIAGNOSIS — R42 Dizziness and giddiness: Secondary | ICD-10-CM

## 2016-08-14 NOTE — Therapy (Signed)
[facility] [address], [place], [place], [address] Phone: [phone]   Fax:  [phone]  Physical Therapy Treatment  Patient Details  Name: Amanda [name] MRN: [id number] Date of Birth: September 25, 1946 Referring Provider: Dr. Magnus Sinning  Encounter Date: 08/14/2016      PT End of Session - 08/14/16 1406    Visit Number 5   Number of Visits 10   Date for PT Re-Evaluation 08/25/16   Authorization Type Medicare - G Codes   PT Start Time [id number]   PT Stop Time 1442   PT Time Calculation (min) 40 min   Activity Tolerance Patient tolerated treatment well   Behavior During Therapy [facility] for tasks assessed/performed      Past Medical History:  Diagnosis Date  . Anemia due to blood loss   . Anxiety   . Arthritis   . Asthma    seasonal or with bronchitis  . Complication of anesthesia    "forgets to breathe" when she wakes up  . Depression   . Diabetes type 2, controlled ([place])    fasting 100-120  . GERD (gastroesophageal reflux disease)   . Headache   . Heart murmur   . Hepatitis    A -- as teenager  . Hyperlipidemia   . Hypertension   . Hypothyroidism   . Mitral valve prolapse   . OSA on CPAP   . Pneumonia    hx of  . Vertigo     Past Surgical History:  Procedure Laterality Date  . ABDOMINAL HYSTERECTOMY    . CHOLECYSTECTOMY    . FRACTURE SURGERY Right    leg  . GALLBLADDER SURGERY  1995  . JOINT REPLACEMENT Left    knee  . NASAL SINUS SURGERY  2005  . PARTIAL HYSTERECTOMY  1983  . THROAT SURGERY  2005  . TOTAL KNEE ARTHROPLASTY Right 09/12/2014   Procedure: TOTAL KNEE ARTHROPLASTY;  Surgeon: Meredith Pel, MD;  Location: [place];  Service: Orthopedics;  Laterality: Right;    There were no vitals filed for this visit.      Subjective Assessment - 08/14/16 1407    Subjective Pt reports having increased calf pain. No back pain.   Pertinent History goes by "Amanda [name]"; anxiety, depression, DM,  thyroid disease, GERD, hyperlipidemia, vertigo, HTN, B TKE (L in 2005; R in 2015); B plantar fasciitis   Limitations Walking;House hold activities   Currently in Pain? No/denies                         [facility] Adult PT Treatment/Exercise - 08/14/16 0001      Lumbar Exercises: Supine   Ab Set 10 reps  With ball rolling   Bent Knee Raise 20 reps   Bridge 10 reps   Other Supine Lumbar Exercises abdom squeeze with red band abduction 10x     Knee/Hip Exercises: Stretches   Active Hamstring Stretch Both;1 rep;10 seconds   Gastroc Stretch 10 seconds;Both   Soleus Stretch 10 seconds;Both     Knee/Hip Exercises: Aerobic   Stationary Bike L1 5 minutes     Knee/Hip Exercises: Standing   Hip Flexion Stengthening;Both;2 sets   Hip Abduction Both;Stengthening;2 sets;10 reps   Hip Extension Stengthening;Both;2 sets;10 reps   Forward Step Up 10 reps;Hand Hold: 2;Both     Knee/Hip Exercises: Seated   Sit to Sand 10 reps;without UE support     Modalities   Modalities Electrical Stimulation  Acupuncturist Location low back   Chartered certified accountant IFC   Electrical Stimulation Parameters to [place]   Electrical Stimulation Goals Pain                  PT Short Term Goals - 08/14/16 1433      PT SHORT TERM GOAL #1   Title Pt will independently perform home exercises to indicate safe daily compliance with HEP. Target date: 07/11/15   Baseline HEP issued 8/3; gaze stabilization exercises initiated 8/10.   Status Achieved     PT SHORT TERM GOAL #2   Title Pt will ambulate at self-selected gait speed of 2.63 ft/sec to indicate status of community ambulator. Target date: 07/11/15   Status On-going     PT SHORT TERM GOAL #3   Title Pt will perform 5 Times Sit-to-Stand Test in <15 seconds without UE support to indicate decreased risk of recurrent falls. Target date: 07/11/15   Status Achieved     PT SHORT TERM GOAL #4    Title Perform DGI to assess gait stability in presence of external demands. Target date: 07/11/15   Baseline 06/20/15: baseline established with score of 15/24   Status Achieved           PT Long Term Goals - 08/14/16 1433      PT LONG TERM GOAL #1   Title Pt will verbalize understanding of fall prevention strategies to decrease risk of falls within home.    Time 4   Period Weeks   Status Achieved     PT LONG TERM GOAL #2   Title Berg balance score >/= 50/56   Time 4   Period Weeks   Status On-going     PT LONG TERM GOAL #3   Title moving in bed with pain minimal due to increased lumbar mobility   Time 4   Period Weeks   Status On-going     PT LONG TERM GOAL #4   Title report minimal pain with daily activites in lumbar spine due to increased strength   Time 4   Status On-going     PT LONG TERM GOAL #5   Title FOTO score </56% limitation   Time 4   Period Weeks   Status On-going     PT LONG TERM GOAL #6   Title independent with HEP with spinal neutral   Time 4   Period Weeks   Status On-going               Plan - 08/14/16 1437    Clinical Impression Statement Pt able to tolerate all exercises well. Lt leg lacking motor control during standing exercises. Pt would continue to benefit from skilled therapy for core and LE stability for balance and safety.    Rehab Potential Good   Clinical Impairments Affecting Rehab Potential None   PT Frequency 2x / week   PT Duration 4 weeks   PT Treatment/Interventions ADLs/Self Care Home Management;Cryotherapy;Electrical Stimulation;Gait training;Ultrasound;Traction;Therapeutic activities;Therapeutic exercise;Balance training;Neuromuscular re-education;Patient/family education;Passive range of motion;Manual techniques;Energy conservation   PT Next Visit Plan exercise in spinal neutral only as per MD,balance exercises; hip and plantarflexion strength;  neutral spine core stabilization    Consulted and Agree with Plan of Care  Patient      Patient will benefit from skilled therapeutic intervention in order to improve the following deficits and impairments:  Abnormal gait, Decreased range of motion, Difficulty walking, Increased fascial restricitons, Increased muscle spasms, Decreased  endurance, Decreased activity tolerance, Pain, Decreased balance, Decreased strength, Decreased mobility  Visit Diagnosis: Muscle weakness (generalized)  Other abnormalities of gait and mobility  Midline low back pain without sciatica  Abnormality of gait  Weakness generalized  Dizziness and giddiness  Unsteadiness     Problem List Patient Active Problem List   Diagnosis Date Noted  . Acute bronchitis due to Streptococcus 11/22/2015  . OSA on CPAP 11/22/2015  . Depression (emotion) 11/22/2015  . Abnormal CT scan   . Hematoma of left psoas region to anticoagulant therapy 10/11/2014  . Coagulopathy ([place]) 10/11/2014  . Psoas hematoma, left, secondary to anticoagulant therapy 10/11/2014  . Acute hyponatremia 10/11/2014  . S/P total knee arthroplasty 09/24/2014  . Hypertension   . GERD (gastroesophageal reflux disease)   . Diabetes type 2, controlled ([place])   . Depression   . Hyperlipidemia   . 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 Cough 08/31/2013    Mikle Bosworth PTA 08/14/2016, 2:54 PM  [facility] [address] [place], [place], [address] Phone: [phone]   Fax:  [phone]  Name: Amanda [name] MRN: [id number] Date of Birth: November 04, 1946

## 2016-08-18 ENCOUNTER — Ambulatory Visit: Payer: Medicare Other | Attending: Physical Medicine and Rehabilitation | Admitting: Physical Therapy

## 2016-08-18 DIAGNOSIS — M545 Low back pain: Secondary | ICD-10-CM | POA: Diagnosis present

## 2016-08-18 DIAGNOSIS — R531 Weakness: Secondary | ICD-10-CM | POA: Insufficient documentation

## 2016-08-18 DIAGNOSIS — R2689 Other abnormalities of gait and mobility: Secondary | ICD-10-CM | POA: Insufficient documentation

## 2016-08-18 DIAGNOSIS — M6281 Muscle weakness (generalized): Secondary | ICD-10-CM | POA: Diagnosis not present

## 2016-08-18 DIAGNOSIS — R269 Unspecified abnormalities of gait and mobility: Secondary | ICD-10-CM | POA: Insufficient documentation

## 2016-08-18 NOTE — Therapy (Signed)
[facility] [address] [place], [place], [address] Phone: [phone]   Fax:  [phone]  Physical Therapy Treatment  Patient Details  Name: Amanda [name] MRN: [id number] Date of Birth: [birth year] Referring Provider: Dr. Magnus Sinning  Encounter Date: 08/18/2016      PT End of Session - 08/18/16 1209    Visit Number 6   Number of Visits 10   Date for PT Re-Evaluation 08/25/16   Authorization Type Medicare - G Codes   PT Start Time [id number]   PT Stop Time 1235   PT Time Calculation (min) 40 min   Activity Tolerance Patient tolerated treatment well   Behavior During Therapy [facility] for tasks assessed/performed      Past Medical History:  Diagnosis Date  . Anemia due to blood loss   . Anxiety   . Arthritis   . Asthma    seasonal or with bronchitis  . Complication of anesthesia    "forgets to breathe" when she wakes up  . Depression   . Diabetes type 2, controlled ([place])    fasting 100-120  . GERD (gastroesophageal reflux disease)   . Headache   . Heart murmur   . Hepatitis    A -- as teenager  . Hyperlipidemia   . Hypertension   . Hypothyroidism   . Mitral valve prolapse   . OSA on CPAP   . Pneumonia    hx of  . Vertigo     Past Surgical History:  Procedure Laterality Date  . ABDOMINAL HYSTERECTOMY    . CHOLECYSTECTOMY    . FRACTURE SURGERY Right    leg  . GALLBLADDER SURGERY  1995  . JOINT REPLACEMENT Left    knee  . NASAL SINUS SURGERY  2005  . PARTIAL HYSTERECTOMY  1983  . THROAT SURGERY  2005  . TOTAL KNEE ARTHROPLASTY Right 09/12/2014   Procedure: TOTAL KNEE ARTHROPLASTY;  Surgeon: Meredith Pel, MD;  Location: [facility];  Service: Orthopedics;  Laterality: Right;    There were no vitals filed for this visit.      Subjective Assessment - 08/18/16 1157    Subjective pt reports having a good day with stiffness and pain when she wakes up in the morning.    Pertinent History goes  by "[name]"; anxiety, depression, DM, thyroid disease, GERD, hyperlipidemia, vertigo, HTN, B TKE (L in 2005; R in 2015); B plantar fasciitis   Limitations Walking;House hold activities   Patient Stated Goals be more mobile and steady on her feet   Currently in Pain? No/denies   Pain Score 0-No pain   Pain Location Back   Pain Orientation Lower   Pain Descriptors / Indicators Aching   Pain Type Chronic pain   Pain Onset More than a month ago   Pain Frequency Intermittent   Aggravating Factors  bending, walking, turning over in bed, difficulty sleeping   Pain Relieving Factors walk   Multiple Pain Sites No                         [facility] Adult PT Treatment/Exercise - 08/18/16 0001      Lumbar Exercises: Supine   Ab Set 10 reps  With ball rolling   Bent Knee Raise 20 reps   Bridge 10 reps   Other Supine Lumbar Exercises abdom squeeze with red band abduction 10x     Knee/Hip Exercises: Stretches   Active Hamstring Stretch Both;1 rep;10  seconds   Gastroc Stretch 10 seconds;Both   Soleus Stretch 10 seconds;Both     Knee/Hip Exercises: Aerobic   Nustep Nustep L2, 6 minutes     Knee/Hip Exercises: Standing   Hip Flexion Stengthening;Both;2 sets   Hip Abduction Both;Stengthening;2 sets;10 reps   Hip Extension Stengthening;Both;2 sets;10 reps   Forward Step Up 10 reps;Hand Hold: 2;Both     Knee/Hip Exercises: Seated   Sit to Sand 10 reps;without UE support     Modalities   Modalities Electrical Stimulation     Electrical Stimulation   Electrical Stimulation Location low back   Electrical Stimulation Action IFC   Electrical Stimulation Parameters to tolerance   Electrical Stimulation Goals Pain                PT Education - 08/18/16 1207    Education provided Yes   Education Details importance of posture, and daily exercises and HEP review   Person(s) Educated Patient   Methods Explanation   Comprehension Verbalized understanding;Returned  demonstration          PT Short Term Goals - 08/18/16 1216      PT SHORT TERM GOAL #1   Title Pt will independently perform home exercises to indicate safe daily compliance with HEP. Target date: 07/11/15   Baseline HEP issued 8/3; gaze stabilization exercises initiated 8/10.   Status Achieved     PT SHORT TERM GOAL #2   Title Pt will ambulate at self-selected gait speed of 2.63 ft/sec to indicate status of community ambulator. Target date: 07/11/15   Status On-going     PT SHORT TERM GOAL #3   Title Pt will perform 5 Times Sit-to-Stand Test in <15 seconds without UE support to indicate decreased risk of recurrent falls. Target date: 07/11/15   Status Achieved     PT SHORT TERM GOAL #4   Title Perform DGI to assess gait stability in presence of external demands. Target date: 07/11/15   Baseline 06/20/15: baseline established with score of 15/24   Status Achieved           PT Long Term Goals - 08/14/16 1433      PT LONG TERM GOAL #1   Title Pt will verbalize understanding of fall prevention strategies to decrease risk of falls within home.    Time 4   Period Weeks   Status Achieved     PT LONG TERM GOAL #2   Title Berg balance score >/= 50/56   Time 4   Period Weeks   Status On-going     PT LONG TERM GOAL #3   Title moving in bed with pain minimal due to increased lumbar mobility   Time 4   Period Weeks   Status On-going     PT LONG TERM GOAL #4   Title report minimal pain with daily activites in lumbar spine due to increased strength   Time 4   Status On-going     PT LONG TERM GOAL #5   Title FOTO score </56% limitation   Time 4   Period Weeks   Status On-going     PT LONG TERM GOAL #6   Title independent with HEP with spinal neutral   Time 4   Period Weeks   Status On-going               Plan - 08/18/16 1210    Clinical Impression Statement pt tolerated well. Pt reporting signs of improvement since beginning therapy. Pt reporting  stiffness when  waking up but no pain when arriving to therapy and when leaving therapy. Continue skilled therapy for continued improvements in balance, strength, pain, and ROM.    Rehab Potential Good   Clinical Impairments Affecting Rehab Potential None   PT Frequency 2x / week   PT Duration 4 weeks   PT Treatment/Interventions ADLs/Self Care Home Management;Cryotherapy;Electrical Stimulation;Gait training;Ultrasound;Traction;Therapeutic activities;Therapeutic exercise;Balance training;Neuromuscular re-education;Patient/family education;Passive range of motion;Manual techniques;Energy conservation   PT Next Visit Plan exercise in spinal neutral only as per MD,balance exercises; hip and plantarflexion strength;  neutral spine core stabilization    PT Home Exercise Plan fall prevention, HEP   Consulted and Agree with Plan of Care Patient      Patient will benefit from skilled therapeutic intervention in order to improve the following deficits and impairments:  Abnormal gait, Decreased range of motion, Difficulty walking, Increased fascial restricitons, Increased muscle spasms, Decreased endurance, Decreased activity tolerance, Pain, Decreased balance, Decreased strength, Decreased mobility  Visit Diagnosis: Muscle weakness (generalized)  Other abnormalities of gait and mobility  Abnormality of gait  Weakness generalized     Problem List Patient Active Problem List   Diagnosis Date Noted  . Acute bronchitis due to Streptococcus 11/22/2015  . OSA on CPAP 11/22/2015  . Depression (emotion) 11/22/2015  . Abnormal CT scan   . Hematoma of left psoas region to anticoagulant therapy 10/11/2014  . Coagulopathy ([place]) 10/11/2014  . Psoas hematoma, left, secondary to anticoagulant therapy 10/11/2014  . Acute hyponatremia 10/11/2014  . S/P total knee arthroplasty 09/24/2014  . Hypertension   . GERD (gastroesophageal reflux disease)   . Diabetes type 2, controlled ([place])   . Depression   . Hyperlipidemia    . 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 Cough 08/31/2013    Oretha Caprice, MPT 08/18/2016, 12:20 PM  [facility] [address] [place], [place], [address] Phone: [phone]   Fax:  [phone]  Name: Amanda [name] MRN: [id number] Date of Birth: [birth year]

## 2016-08-21 ENCOUNTER — Encounter: Payer: Self-pay | Admitting: Physical Therapy

## 2016-08-21 ENCOUNTER — Ambulatory Visit: Payer: Medicare Other | Admitting: Physical Therapy

## 2016-08-21 DIAGNOSIS — M6281 Muscle weakness (generalized): Secondary | ICD-10-CM

## 2016-08-21 DIAGNOSIS — R531 Weakness: Secondary | ICD-10-CM

## 2016-08-21 DIAGNOSIS — R269 Unspecified abnormalities of gait and mobility: Secondary | ICD-10-CM

## 2016-08-21 DIAGNOSIS — R2689 Other abnormalities of gait and mobility: Secondary | ICD-10-CM

## 2016-08-21 NOTE — Therapy (Signed)
[facility] [address] [place], [place], [address] Phone: [phone]   Fax:  [phone]  Physical Therapy Treatment  Patient Details  Name: Amanda [name] MRN: [id number] Date of Birth: [birth year] Referring Provider: Dr. Magnus Sinning  Encounter Date: 08/21/2016      PT End of Session - 08/21/16 1114    Visit Number 7   Number of Visits 10   Date for PT Re-Evaluation 08/25/16   Authorization Type Medicare - G Codes   PT Start Time 1108/02/27  Pt 9 minutes late   PT Stop Time 1145   PT Time Calculation (min) 36 min   Activity Tolerance Patient tolerated treatment well   Behavior During Therapy [facility] for tasks assessed/performed      Past Medical History:  Diagnosis Date  . Anemia due to blood loss   . Anxiety   . Arthritis   . Asthma    seasonal or with bronchitis  . Complication of anesthesia    "forgets to breathe" when she wakes up  . Depression   . Diabetes type 2, controlled ([place])    fasting 100-120  . GERD (gastroesophageal reflux disease)   . Headache   . Heart murmur   . Hepatitis    A -- as teenager  . Hyperlipidemia   . Hypertension   . Hypothyroidism   . Mitral valve prolapse   . OSA on CPAP   . Pneumonia    hx of  . Vertigo     Past Surgical History:  Procedure Laterality Date  . ABDOMINAL HYSTERECTOMY    . CHOLECYSTECTOMY    . FRACTURE SURGERY Right    leg  . GALLBLADDER SURGERY  1995  . JOINT REPLACEMENT Left    knee  . NASAL SINUS SURGERY  February 27, 2004  . PARTIAL HYSTERECTOMY  1983  . THROAT SURGERY  02/27/2004  . TOTAL KNEE ARTHROPLASTY Right 09/12/2014   Procedure: TOTAL KNEE ARTHROPLASTY;  Surgeon: Meredith Pel, MD;  Location: [facility];  Service: Orthopedics;  Laterality: Right;    There were no vitals filed for this visit.      Subjective Assessment - 08/21/16 1112    Subjective Pt reports feeling well today. Having some increased knee stiffness today but back feels good.     Currently in Pain? No/denies                         [facility] Adult PT Treatment/Exercise - 08/21/16 0001      Lumbar Exercises: Supine   Ab Set 10 reps  With ball rolling   Bent Knee Raise 20 reps  #2   Bridge 10 reps     Knee/Hip Exercises: Stretches   Active Hamstring Stretch Both;1 rep;10 seconds   Gastroc Stretch 10 seconds;Both   Soleus Stretch 10 seconds;Both     Knee/Hip Exercises: Aerobic   Stationary Bike L1 7 minutes     Knee/Hip Exercises: Standing   Hip Flexion Stengthening;Both;2 sets  yellow tband   Hip Abduction Both;Stengthening;2 sets;10 reps  yellow tband   Hip Extension Stengthening;Both;2 sets;10 reps  yellow tband   Forward Step Up Both;20 reps;Hand Hold: 1     Knee/Hip Exercises: Seated   Sit to Sand --     Knee/Hip Exercises: Supine   Straight Leg Raises Strengthening;Both;2 sets;10 reps  #2                  PT Short Term  Goals - 08/18/16 1216      PT SHORT TERM GOAL #1   Title Pt will independently perform home exercises to indicate safe daily compliance with HEP. Target date: 07/11/15   Baseline HEP issued 8/3; gaze stabilization exercises initiated 8/10.   Status Achieved     PT SHORT TERM GOAL #2   Title Pt will ambulate at self-selected gait speed of 2.63 ft/sec to indicate status of community ambulator. Target date: 07/11/15   Status On-going     PT SHORT TERM GOAL #3   Title Pt will perform 5 Times Sit-to-Stand Test in <15 seconds without UE support to indicate decreased risk of recurrent falls. Target date: 07/11/15   Status Achieved     PT SHORT TERM GOAL #4   Title Perform DGI to assess gait stability in presence of external demands. Target date: 07/11/15   Baseline 06/20/15: baseline established with score of 15/24   Status Achieved           PT Long Term Goals - 08/14/16 1433      PT LONG TERM GOAL #1   Title Pt will verbalize understanding of fall prevention strategies to decrease risk of falls  within home.    Time 4   Period Weeks   Status Achieved     PT LONG TERM GOAL #2   Title Berg balance score >/= 50/56   Time 4   Period Weeks   Status On-going     PT LONG TERM GOAL #3   Title moving in bed with pain minimal due to increased lumbar mobility   Time 4   Period Weeks   Status On-going     PT LONG TERM GOAL #4   Title report minimal pain with daily activites in lumbar spine due to increased strength   Time 4   Status On-going     PT LONG TERM GOAL #5   Title FOTO score </56% limitation   Time 4   Period Weeks   Status On-going     PT LONG TERM GOAL #6   Title independent with HEP with spinal neutral   Time 4   Period Weeks   Status On-going               Plan - 08/21/16 1154    Clinical Impression Statement Pt having some increased knee stiffness today but back feeling good. Able to tolerate increased resistance on standing exercises. Some verbal cueing for good posture and technique. Pt will continue to benefit from skilled therapy for core stability and balance training.    Rehab Potential Good   Clinical Impairments Affecting Rehab Potential None   PT Frequency 2x / week   PT Duration 4 weeks   PT Treatment/Interventions ADLs/Self Care Home Management;Cryotherapy;Electrical Stimulation;Gait training;Ultrasound;Traction;Therapeutic activities;Therapeutic exercise;Balance training;Neuromuscular re-education;Patient/family education;Passive range of motion;Manual techniques;Energy conservation   PT Next Visit Plan exercise in spinal neutral only as per MD,balance exercises; hip and plantarflexion strength;  neutral spine core stabilization, Balance training   Consulted and Agree with Plan of Care Patient      Patient will benefit from skilled therapeutic intervention in order to improve the following deficits and impairments:  Abnormal gait, Decreased range of motion, Difficulty walking, Increased fascial restricitons, Increased muscle spasms,  Decreased endurance, Decreased activity tolerance, Pain, Decreased balance, Decreased strength, Decreased mobility  Visit Diagnosis: Muscle weakness (generalized)  Other abnormalities of gait and mobility  Abnormality of gait  Weakness generalized     Problem List Patient  Active Problem List   Diagnosis Date Noted  . Acute bronchitis due to Streptococcus 11/22/2015  . OSA on CPAP 11/22/2015  . Depression (emotion) 11/22/2015  . Abnormal CT scan   . Hematoma of left psoas region to anticoagulant therapy 10/11/2014  . Coagulopathy ([place]) 10/11/2014  . Psoas hematoma, left, secondary to anticoagulant therapy 10/11/2014  . Acute hyponatremia 10/11/2014  . S/P total knee arthroplasty 09/24/2014  . Hypertension   . GERD (gastroesophageal reflux disease)   . Diabetes type 2, controlled ([place])   . Depression   . Hyperlipidemia   . 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 Cough 08/31/2013    Mikle Bosworth PTA 08/21/2016, 12:12 PM  [facility] [address] [place], [place], [address] Phone: [phone]   Fax:  [phone]  Name: Amanda [name] MRN: [id number] Date of Birth: May 02, 1946

## 2016-08-25 ENCOUNTER — Encounter: Payer: Self-pay | Admitting: Physical Therapy

## 2016-08-25 ENCOUNTER — Ambulatory Visit: Payer: Medicare Other | Admitting: Physical Therapy

## 2016-08-25 DIAGNOSIS — M545 Low back pain, unspecified: Secondary | ICD-10-CM

## 2016-08-25 DIAGNOSIS — M6281 Muscle weakness (generalized): Secondary | ICD-10-CM | POA: Diagnosis not present

## 2016-08-25 DIAGNOSIS — R2689 Other abnormalities of gait and mobility: Secondary | ICD-10-CM

## 2016-08-25 DIAGNOSIS — R269 Unspecified abnormalities of gait and mobility: Secondary | ICD-10-CM

## 2016-08-25 DIAGNOSIS — R531 Weakness: Secondary | ICD-10-CM

## 2016-08-25 NOTE — Therapy (Addendum)
[facility] [address] [place], [place], [address] Phone: [phone]   Fax:  [phone]  Physical Therapy Treatment  Patient Details  Name: Amanda [name] MRN: [id number] Date of Birth: 02/05/1946 Referring Provider: Dr. Magnus Sinning  Encounter Date: 08/25/2016      PT End of Session - 08/25/16 1107    Visit Number 8   Number of Visits 10   Date for PT Re-Evaluation 08/25/16   Authorization Type Medicare - G Codes   PT Start Time 1104/01/30   PT Stop Time 1146   PT Time Calculation (min) 41 min   Activity Tolerance Patient tolerated treatment well   Behavior During Therapy [facility] for tasks assessed/performed      Past Medical History:  Diagnosis Date  . Anemia due to blood loss   . Anxiety   . Arthritis   . Asthma    seasonal or with bronchitis  . Complication of anesthesia    "forgets to breathe" when she wakes up  . Depression   . Diabetes type 2, controlled ([place])    fasting 100-120  . GERD (gastroesophageal reflux disease)   . Headache   . Heart murmur   . Hepatitis    A -- as teenager  . Hyperlipidemia   . Hypertension   . Hypothyroidism   . Mitral valve prolapse   . OSA on CPAP   . Pneumonia    hx of  . Vertigo     Past Surgical History:  Procedure Laterality Date  . ABDOMINAL HYSTERECTOMY    . CHOLECYSTECTOMY    . FRACTURE SURGERY Right    leg  . GALLBLADDER SURGERY  1995  . JOINT REPLACEMENT Left    knee  . NASAL SINUS SURGERY  Jan 30, 2004  . PARTIAL HYSTERECTOMY  1983  . THROAT SURGERY  01/30/04  . TOTAL KNEE ARTHROPLASTY Right 09/12/2014   Procedure: TOTAL KNEE ARTHROPLASTY;  Surgeon: Meredith Pel, MD;  Location: [facility];  Service: Orthopedics;  Laterality: Right;    There were no vitals filed for this visit.      Subjective Assessment - 08/25/16 1123    Subjective Pt reports no pain today. Yesterday had increased pain of 5/10 due to prolonged sitting followed by prolonged  walking.    Currently in Pain? No/denies      g-code: Functional assessment tool used is FOTO score is 56% limitation; Functional limitation is Mobility walking and moving around; goal is CK and discharge is CK.  Earlie Counts, PT 10/16/16 3:28 PM                     [facility] Adult PT Treatment/Exercise - 08/25/16 0001      Lumbar Exercises: Supine   Ab Set 10 reps  With ball rolling   Bent Knee Raise 20 reps  #2   Bridge 10 reps     Knee/Hip Exercises: Stretches   Active Hamstring Stretch Both;1 rep;10 seconds   Gastroc Stretch 10 seconds;Both   Soleus Stretch 10 seconds;Both     Knee/Hip Exercises: Aerobic   Stationary Bike L1 8 minutes     Knee/Hip Exercises: Standing   Hip Flexion Stengthening;Both;2 sets  yellow tband   Hip Abduction Both;Stengthening;2 sets;10 reps  yellow tband   Hip Extension Stengthening;Both;2 sets;10 reps  yellow tband   Forward Step Up Both;20 reps;Hand Hold: 1     Knee/Hip Exercises: Seated   Sit to Sand without UE support;20 reps  Knee/Hip Exercises: Supine   Straight Leg Raises Strengthening;Both;2 sets;10 reps  #2                  PT Short Term Goals - 08/25/16 1108      PT SHORT TERM GOAL #1   Title Pt will independently perform home exercises to indicate safe daily compliance with HEP. Target date: 07/11/15   Baseline HEP issued 8/3; gaze stabilization exercises initiated 8/10.   Status Achieved     PT SHORT TERM GOAL #2   Title Pt will ambulate at self-selected gait speed of 2.63 ft/sec to indicate status of community ambulator. Target date: 07/11/15   Status On-going     PT SHORT TERM GOAL #3   Title Pt will perform 5 Times Sit-to-Stand Test in <15 seconds without UE support to indicate decreased risk of recurrent falls. Target date: 07/11/15   Status Achieved     PT SHORT TERM GOAL #4   Title Perform DGI to assess gait stability in presence of external demands. Target date: 07/11/15   Baseline  06/20/15: baseline established with score of 15/24   Status Achieved           PT Long Term Goals - 08/25/16 1109      PT LONG TERM GOAL #1   Title Pt will verbalize understanding of fall prevention strategies to decrease risk of falls within home.    Time 4   Period Weeks   Status Achieved     PT LONG TERM GOAL #2   Title Berg balance score >/= 50/56   Time 4   Period Weeks   Status On-going     PT LONG TERM GOAL #3   Title moving in bed with pain minimal due to increased lumbar mobility   Time 4   Period Weeks   Status On-going     PT LONG TERM GOAL #4   Title report minimal pain with daily activites in lumbar spine due to increased strength   Time 4   Status On-going     PT LONG TERM GOAL #5   Title FOTO score </56% limitation   Time 4   Period Weeks   Status On-going     PT LONG TERM GOAL #6   Title independent with HEP with spinal neutral   Time 4   Period Weeks   Status On-going               Plan - 08/25/16 1138    Clinical Impression Statement Pt reports no pain in back today. Able to copmlete all exercises with no increase in pain. Needing some verbal cues to engage core. Pt continues to have weakness in core and Bil LE. Pt will continue to benefit from skilled therapy  for core strenghtening and stability.    Rehab Potential Good   Clinical Impairments Affecting Rehab Potential None   PT Frequency 2x / week   PT Duration 4 weeks   PT Treatment/Interventions ADLs/Self Care Home Management;Cryotherapy;Electrical Stimulation;Gait training;Ultrasound;Traction;Therapeutic activities;Therapeutic exercise;Balance training;Neuromuscular re-education;Patient/family education;Passive range of motion;Manual techniques;Energy conservation   PT Next Visit Plan exercise in spinal neutral only as per MD,balance exercises; hip and plantarflexion strength;  neutral spine core stabilization, Balance training   Consulted and Agree with Plan of Care Patient       Patient will benefit from skilled therapeutic intervention in order to improve the following deficits and impairments:  Abnormal gait, Decreased range of motion, Difficulty walking, Increased fascial restricitons, Increased muscle  spasms, Decreased endurance, Decreased activity tolerance, Pain, Decreased balance, Decreased strength, Decreased mobility  Visit Diagnosis: Muscle weakness (generalized)  Other abnormalities of gait and mobility  Weakness generalized  Abnormality of gait  Acute midline low back pain without sciatica     Problem List Patient Active Problem List   Diagnosis Date Noted  . Acute bronchitis due to Streptococcus 11/22/2015  . OSA on CPAP 11/22/2015  . Depression (emotion) 11/22/2015  . Abnormal CT scan   . Hematoma of left psoas region to anticoagulant therapy 10/11/2014  . Coagulopathy ([place]) 10/11/2014  . Psoas hematoma, left, secondary to anticoagulant therapy 10/11/2014  . Acute hyponatremia 10/11/2014  . S/P total knee arthroplasty 09/24/2014  . Hypertension   . GERD (gastroesophageal reflux disease)   . Diabetes type 2, controlled ([place])   . Depression   . Hyperlipidemia   . 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 Cough 08/31/2013    Mikle Bosworth PTA 08/25/2016, 4:51 PM  [facility] [address], [place], [place], [address] Phone: [phone]   Fax:  [phone]  Name: Amanda [name] MRN: [id number] Date of Birth: Apr 16, 1946  PHYSICAL THERAPY DISCHARGE SUMMARY  Visits from Start of Care: 8  Current functional level related to goals / functional outcomes: See above. Did not return after last visit on 08/25/2016.    Remaining deficits: See above.    Education / Equipment: HEP Plan:                                                    Patient goals were not met. Patient is being discharged due to not returning since the last visit.  Thank you for the referral. Earlie Counts, PT 10/16/16 3:31 PM  ?????

## 2016-08-27 ENCOUNTER — Ambulatory Visit
Admission: RE | Admit: 2016-08-27 | Discharge: 2016-08-27 | Disposition: A | Discharge status: Home / Self Care | Payer: Medicare Other | Source: Ambulatory Visit | Attending: Family Medicine | Admitting: Family Medicine

## 2016-08-27 DIAGNOSIS — Z1231 Encounter for screening mammogram for malignant neoplasm of breast: Secondary | ICD-10-CM

## 2016-08-28 ENCOUNTER — Encounter: Payer: Medicare Other | Admitting: Physical Therapy

## 2016-11-18 ENCOUNTER — Ambulatory Visit (INDEPENDENT_AMBULATORY_CARE_PROVIDER_SITE_OTHER): Payer: Medicare Other | Admitting: Orthopedic Surgery

## 2016-11-18 ENCOUNTER — Encounter (INDEPENDENT_AMBULATORY_CARE_PROVIDER_SITE_OTHER): Payer: Self-pay | Admitting: Orthopedic Surgery

## 2016-11-18 ENCOUNTER — Ambulatory Visit (INDEPENDENT_AMBULATORY_CARE_PROVIDER_SITE_OTHER): Payer: Medicare Other

## 2016-11-18 VITALS — Ht 64.5 in | Wt 215.0 lb

## 2016-11-18 DIAGNOSIS — M79672 Pain in left foot: Secondary | ICD-10-CM | POA: Diagnosis not present

## 2016-11-18 DIAGNOSIS — B070 Plantar wart: Secondary | ICD-10-CM | POA: Diagnosis not present

## 2016-11-18 NOTE — Progress Notes (Signed)
Office Visit Note   Patient: Amanda [name]           Date of Birth: 05/08/1946           MRN: [id number] Visit Date: 11/18/2016              Requested by: Katherina Mires, MD [address] [place], [place] [address] PCP: Suzanna Obey, MD   Assessment & Plan: Visit Diagnoses:  1. Pain in left foot   2. Plantar wart of left foot     Plan: Him discuss conservative measures for the foot pain. She will begin with over-the-counter plantar wart treatment. Follow-up in the office as needed.  Follow-Up Instructions: No Follow-up on file.   Orders:  Orders Placed This Encounter  Procedures  . XR Foot Complete Left   No orders of the defined types were placed in this encounter.     Procedures: No procedures performed   Clinical Data: No additional findings.   Subjective: Chief Complaint  Patient presents with  . Left Foot - Pain    Left foot pain ~1 month ago, patient had reindeer stocking holder fall onto her forefoot. She denies swelling, minimal redness, she c/o aching pain. She questions if she has plantar wart lateral heel. Not currently taking anything for pain.     The patient is a 71 year old woman who reports that about 3 weeks ago she was in her closet when a brass reindeer stocking holder fell onto her left foot. Points to the third metatarsal head is most painful area. Has had pain with weightbearing.  Also would like to get a spot on her left heel looked is concerned she may have a plantar's wart. Has a history of plantars warts.   Been taking anything for pain.     Review of Systems  Constitutional: Negative for chills and fever.  Musculoskeletal: Positive for arthralgias.  Skin: Positive for color change. Negative for wound.     Objective: Vital Signs: Ht 5' 4.5" (1.638 m)   Wt 215 lb (97.5 kg)   BMI 36.33 kg/m   Physical Exam  Constitutional: She is oriented to person, place, and time. She appears well-developed and  well-nourished.  Pulmonary/Chest: Effort normal.  Musculoskeletal:  Tender over dorsal left foot third metatarsal head. Does have a strong dorsalis pedis pulse. There are no wounds no erythema no infection.   Over lateral left heel, plantar aspect. Discolored area with callus. This was pared. No corn. Does have a plantar wart.   Neurological: She is alert and oriented to person, place, and time.  Psychiatric: She has a normal mood and affect.  Nursing note reviewed.   Ortho Exam  Specialty Comments:  No specialty comments available.  Imaging: Xr Foot Complete Left  Result Date: 11/18/2016 Three-view radiographs of the left foot are negative for fracture. No acute bony abnormalities.    PMFS History: Patient Active Problem List   Diagnosis Date Noted  . Acute bronchitis due to Streptococcus 11/22/2015  . OSA on CPAP 11/22/2015  . Depression (emotion) 11/22/2015  . Abnormal CT scan   . Hematoma of left psoas region to anticoagulant therapy 10/11/2014  . Coagulopathy ([facility]) 10/11/2014  . Psoas hematoma, left, secondary to anticoagulant therapy 10/11/2014  . Acute hyponatremia 10/11/2014  . S/P total knee arthroplasty 09/24/2014  . Hypertension   . GERD (gastroesophageal reflux disease)   . Diabetes type 2, controlled ([facility])   . Depression   . Hyperlipidemia   .  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 Cough 08/31/2013   Past Medical History:  Diagnosis Date  . Anemia due to blood loss   . Anxiety   . Arthritis   . Asthma    seasonal or with bronchitis  . Complication of anesthesia    "forgets to breathe" when she wakes up  . Depression   . Diabetes type 2, controlled ([facility])    fasting 100-120  . GERD (gastroesophageal reflux disease)   . Headache   . Heart murmur   . Hepatitis    A -- as teenager  . Hyperlipidemia   . Hypertension   . Hypothyroidism   . Mitral valve prolapse   . OSA on CPAP   . Pneumonia    hx of  . Vertigo     Family History  Problem Relation  Age of Onset  . Cancer Mother     panceratic  . Cancer - Colon Mother     Past Surgical History:  Procedure Laterality Date  . ABDOMINAL HYSTERECTOMY    . CHOLECYSTECTOMY    . FRACTURE SURGERY Right    leg  . GALLBLADDER SURGERY  1995  . JOINT REPLACEMENT Left    knee  . NASAL SINUS SURGERY  2005  . PARTIAL HYSTERECTOMY  1983  . THROAT SURGERY  2005  . TOTAL KNEE ARTHROPLASTY Right 09/12/2014   Procedure: TOTAL KNEE ARTHROPLASTY;  Surgeon: Meredith Pel, MD;  Location: [facility];  Service: Orthopedics;  Laterality: Right;   Social History   Occupational History  . retired     Secondary school teacher   Social History Main Topics  . Smoking status: Never Smoker  . Smokeless tobacco: Never Used  . Alcohol use No     Comment: socially  . Drug use: No  . Sexual activity: Not on file

## 2017-01-21 ENCOUNTER — Encounter: Payer: Self-pay | Admitting: Neurology

## 2017-01-22 ENCOUNTER — Ambulatory Visit (INDEPENDENT_AMBULATORY_CARE_PROVIDER_SITE_OTHER): Payer: Medicare Other | Admitting: Neurology

## 2017-01-22 ENCOUNTER — Encounter: Payer: Self-pay | Admitting: Neurology

## 2017-01-22 VITALS — BP 156/76 | HR 81 | Resp 20 | Ht 64.5 in | Wt 215.0 lb

## 2017-01-22 DIAGNOSIS — G4733 Obstructive sleep apnea (adult) (pediatric): Secondary | ICD-10-CM

## 2017-01-22 DIAGNOSIS — Z9989 Dependence on other enabling machines and devices: Secondary | ICD-10-CM

## 2017-01-22 DIAGNOSIS — R4189 Other symptoms and signs involving cognitive functions and awareness: Secondary | ICD-10-CM | POA: Insufficient documentation

## 2017-01-22 DIAGNOSIS — F6811 Factitious disorder with predominantly psychological signs and symptoms: Secondary | ICD-10-CM | POA: Diagnosis not present

## 2017-01-22 MED ORDER — CLONAZEPAM 0.5 MG PO TABS: 0.5000 mg | ORAL_TABLET | Freq: Every day | ORAL | 0 refills | Status: DC

## 2017-01-22 MED ORDER — BUPROPION HCL ER (XL) 300 MG PO TB24: 300.0000 mg | ORAL_TABLET | Freq: Every day | ORAL | 3 refills | Status: AC

## 2017-01-22 NOTE — Progress Notes (Signed)
SLEEP MEDICINE CLINIC   Provider:  Larey Seat, M D  Referring Provider: Katherina Mires, MD Primary Care Physician:  Suzanna Obey, MD  Chief Complaint  Patient presents with  . Follow-up    memory and cpap    HPI:  Amanda [name] is a 71 y.o. female , seen here as a referral from Dr. Doreene Nest for a sleep consultation.  I have met Amanda [name] many years ago when I took care of her husband who presented with progressive sexual disinhibition in the setting of  frontal lobe dementia.  The patient is here today to be reevaluated for possible obstructive sleep apnea and treatment options to be discussed. She had some medical complications over the last 24 months. In November 2015 she had a knee replacement on the right and reports that her Coumadin levels were not checked after hospitalization and surgery. She was declining in her overall health became very weak and had finally been diagnosed with retroperitoneal bleed. She had very low red blood cell counts and hematocrits and had to be transfused. Her current medications include metformin, bupropion, trazodone, Effexor, L-thyroxine, atorvastatin, metoprolol, omeprazole, Klonopin, losartan. She lost 78 pounds over the last 2 years,  partially due to her medical complications , I think partially due to the stress with her husband's progressive illness. He no longer lives at home but is now institutionalized. He usually mistakes his wife for his sister.  There have been a lot of financial stressors.  The patient was diagnosed with sleep apnea over 10 years ago. She was placed on CPAP and had used it for many years until it broke and she is now not sure if she will be qualifies or needs a new test to requalify. She wakes up with a dry mouth. I have a sleep study from 2013 available here that was ordered as a split-night polysomnography. The patient was using CPAP at 14 cm water pressure at the time at home her AHI was 20 and her RDI 28.7. Her lowest  oxygen nadir. His 84% in nonsupine non-REM sleep. The patient spent 87 minutes at or below 90% saturation. She was titrated to only 5 cm water pressure the AHI became 3.1 which led me to believe that she probably needs a slightly higher pressure than this. I will reevaluate the patient  Sleep habits are as follows: Patient goes to bed around 11 PM tries to rise in the morning at 8 AM not always feeling refreshed or restored enough to do so. She sleeps alone is alone in the house. There has been nobody witnessing her sleep. She does have a dog. She has noted that she falls sleep easier in daytime seems to be easy to doze off. She has seen her family medical physician to evaluate 3 recent falls. The patient has no history of shift work on nighttime work. 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  Usually asleep within 5 minutes , sleeping through the night, rarely have one interruption due to urinary urge. She wakes herself up from sleep snoring she reports has a dry mouth in the morning and sometimes a morning headache. In spite of the significant weight loss over the last 24 months she still considered obese.  I reviewed her recent metabolic panels and she does have an elevated fasting glucose, some microalbuminuria. TSH has been reduced over the last 12 months but  is in normal range, hemoglobin A1c 2 months ago was 6.1. The control of her HbA1c also correlated with the loss of weight. Sleep medical history ; see above doagnosed 2004 for the first time, and 2013 re titrated after a SPLIT night study.  Social history: Due to her husband's dementing illness the couple live separated. He had to be institutionalized. Amanda [name] is retired and lives no alone in the families home.  She has 2 adult children that are not living close by. She is not a smoker, not a drinker, has no history of illicit drug use.     Revisit from 11/22/2015. Amanda [name] is living alone since her husband had to be confined to a memory loss unit. She has a tendency to develop wintertime bronchitis and often develops pneumonia on top of that. She has now struggled 3 weeks with bronchitis. She had to stop using the CPAP on December 23 because of this pulmonary underlying condition. For the first 22 days of the month however her compliance was excellent at 93%. She is using the machine on average 6 hours and 56 minutes. Set pressure is 6 cm water was too thin meter EPR the AHI is 4.0 which is good.  The patient was referred for sleep study performed on 08/28/2015. She had endorsed the Epworth sleepiness score at 1211 points and presented with a BMI of 35.4. Her AHI was 16.3 and her RDI was 19.3 during supine sleep that your back her AHI was 21.2. She did have brief periods of oxygen desaturation. She did have frequent periods of limb movements which also woke her up with an arousal index of 17.8. I recommended 2 things CPAP at 6 cm water, a nasal mask as long as she has the ability to breathe through the nose  .  As to her frequent limb movements I think they need to be treated with medication. Restless leg syndrome can also be  following iron deficiency, and this patient has IBS.  This is our first visit with the patient since her 69th birthday. For this reason I also performed a Montral cognitive assessment test was her. She did failed the trail making test, she was able to perform a cube drawing named ALT 3 animals correctly and to a clock face. She missed 2 points of the delayed recall option and one attention.  Interval history from 01/22/2017, Amanda [name] is meanwhile widowed, and also is living with less stress, there is less financial burden on her and certainly less worry. This is reflected and better sleep, a normalized appetite and high activity levels she has more desire to be socially active and interactive. She is no longer  excessively fatigued endorsed the fatigue score at only 21 points, the Epworth sleepiness score at 5 points and the depression score at 1 out of 15 points. She scored a normal level on the Montral cognitive assessment a 28 given her education, she has a Chiropodist in education. She had to interrupt CPAP use from February 22 through the third of due to nasal congestion. She has used the machine every other day of the month but over the last 90 days has been extremely compliant 87% compliance and an average user time of 6 hours and 19 minutes, the pressure is set at 6 cm water pressure with 2 cm EPR, her residual AHI is 3.3. She did gain a little bit of weight and I think that we need to adjust her pressure by 1  cm to cut down on the residual apnea index.  Montreal Cognitive Assessment  01/22/2017 11/22/2015  Visuospatial/ Executive (0/5) 5 4  Naming (0/3) 3 3  Attention: Read list of digits (0/2) 2 1  Attention: Read list of letters (0/1) 1 1  Attention: Serial 7 subtraction starting at 100 (0/3) 3 3  Language: Repeat phrase (0/2) 2 2  Language : Fluency (0/1) 1 1  Abstraction (0/2) 2 2  Delayed Recall (0/5) 4 3  Orientation (0/6) 5 6  Total 28 26  Adjusted Score (based on education) 28 26   BS degree in education.    Review of Systems: Out of a complete 14 system review, the patient complains of only the following symptoms, and all other reviewed systems are negative.  Snoring, fatigue, EDS, dry mouth, obese,  Coughing,productive cough.  No longer diarrhea.     Social History   Social History  . Marital status: Married    Spouse name: N/A  . Number of children: N/A  . Years of education: N/A   Occupational History  . retired     Secondary school teacher   Social History Main Topics  . Smoking status: Never Smoker  . Smokeless tobacco: Never Used  . Alcohol use No     Comment: socially  . Drug use: No  . Sexual activity: Not on file   Other Topics Concern  . Not  on file   Social History Narrative  . No narrative on file    Family History  Problem Relation Age of Onset  . Cancer Mother     panceratic  . Cancer - Colon Mother     Past Medical History:  Diagnosis Date  . Anemia due to blood loss   . Anxiety   . Arthritis   . Asthma    seasonal or with bronchitis  . Complication of anesthesia    "forgets to breathe" when she wakes up  . Depression   . Diabetes type 2, controlled ([facility])    fasting 100-120  . GERD (gastroesophageal reflux disease)   . Headache   . Heart murmur   . Hepatitis    A -- as teenager  . Hyperlipidemia   . Hypertension   . Hypothyroidism   . Mitral valve prolapse   . OSA on CPAP   . Pneumonia    hx of  . Vertigo     Past Surgical History:  Procedure Laterality Date  . ABDOMINAL HYSTERECTOMY    . CHOLECYSTECTOMY    . FRACTURE SURGERY Right    leg  . GALLBLADDER SURGERY  1995  . JOINT REPLACEMENT Left    knee  . NASAL SINUS SURGERY  2005  . PARTIAL HYSTERECTOMY  1983  . THROAT SURGERY  2005  . TOTAL KNEE ARTHROPLASTY Right 09/12/2014   Procedure: TOTAL KNEE ARTHROPLASTY;  Surgeon: Meredith Pel, MD;  Location: [facility];  Service: Orthopedics;  Laterality: Right;    Current Outpatient Prescriptions  Medication Sig Dispense Refill  . buPROPion (WELLBUTRIN XL) 300 MG 24 hr tablet Take 300 mg by mouth daily.      . Cholecalciferol (VITAMIN D3) 1000 UNITS CAPS Take 1,000 Units by mouth daily.     . clonazePAM (KLONOPIN) 0.5 MG tablet Take 1 tablet (0.5 mg total) by mouth daily. (Patient taking differently: Take 0.5 mg by mouth 3 (three) times daily as needed for anxiety. ) 30 tablet 0  . levothyroxine (SYNTHROID, LEVOTHROID) 25 MCG tablet Take 25 mcg by mouth  daily.      . losartan (COZAAR) 25 MG tablet Take 25 mg by mouth daily.     . metFORMIN (GLUCOPHAGE) 500 MG tablet Take 1 tablet (500 mg total) by mouth 2 (two) times daily with a meal.    . metoprolol tartrate (LOPRESSOR) 25 MG tablet Take  25 mg by mouth 2 (two) times daily.    Marland Kitchen omeprazole (PRILOSEC) 40 MG capsule Take 40 mg by mouth daily.      . pravastatin (PRAVACHOL) 40 MG tablet Take 40 mg by mouth daily.    . traZODone (DESYREL) 100 MG tablet Take 100-200 mg by mouth at bedtime. Most of the time takes 100 mg    . venlafaxine (EFFEXOR) 50 MG tablet Take 2 tablets (100 mg total) by mouth daily. 90 tablet 3   No current facility-administered medications for this visit.     Allergies as of 01/22/2017 - Review Complete 01/22/2017  Allergen Reaction Noted  . Atorvastatin Other (See Comments) 11/22/2015  . Aciphex [rabeprazole sodium] Hives 09/23/2011  . Erythromycin Nausea And Vomiting 09/23/2011  . Macrodantin Nausea And Vomiting 09/23/2011  . Nsaids  08/30/2014  . Omeprazole-sodium bicarbonate [omeprazole-sodium bicarbonate] Hives 09/23/2011  . Sulfa antibiotics Nausea Only and Other (See Comments) 09/23/2011  . Tape Rash 08/31/2014    Vitals: BP (!) 156/76   Pulse 81   Resp 20   Ht 5' 4.5" (1.638 m)   Wt 215 lb (97.5 kg)   BMI 36.33 kg/m  Last Weight:  Wt Readings from Last 1 Encounters:  01/22/17 215 lb (97.5 kg)   KGM:WNUU mass index is 36.33 kg/m.     Last Height:   Ht Readings from Last 1 Encounters:  01/22/17 5' 4.5" (1.638 m)    Physical exam:  General: The patient is awake, alert and appears not in acute distress. The patient is well groomed. Head: Normocephalic, atraumatic. Neck is supple. Mallampati 3,  neck circumference: 15 . Nasal airflow unrestricted , TMJ click is evident . Retrognathia high grade.   Cardiovascular:  Deferred. VS taken BMI  36.33   Neurologic exam : The patient is awake and alert, oriented to place and time.  She reports not having any desire to cook or eat, not to clean or iron. She appears depressed.  Memory subjective described as intact. MOCA 28 -30 .   Speech is fluent, without dysarthria, dysphonia or aphasia. Mood and affect are appropriate. Cranial  nerves: Pupils are equal and briskly reactive to light. Visual fields by finger perimetry are intact. Hearing to finger rub intact.  Facial motor strength is symmetric , with the  tongue and uvula in midline.  Shoulder shrug was symmetrical. The patient has droopy shoulders.  Finger-to-nose maneuver normal without evidence of ataxia, dysmetria or tremor. Patient walks without assistive device and is able unassisted to climb up to the exam table.  She walks arthralgic . Strength within normal limits.   Deep tendon reflexes: in the upper and lower extremities are symmetric and intact. Babinski maneuver response is  downgoing.  The patient was advised of the nature of the diagnosed sleep disorder , the treatment options and risks for general a health and wellness arising from not treating the condition.  I spent more than 15 minutes of face to face time with the patient. Greater than 50% of time was spent in counseling and coordination of care. We have discussed the diagnosis and differential and I answered the patient's questions.     Assessment:  After physical and neurologic examination, review of laboratory studies,  Personal review of imaging studies, reports of other /same  Imaging studies ,  Results of polysomnography/ neurophysiology testing and pre-existing records as far as provided in visit., my assessment is   1) Amanda [name] has OSA;  CPAP based on the November 2015 PSG and Split. She is using the CPAP compliantly . 2) Normal MOCA score- she is not as easiely distracted anymore. 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     Asencion Partridge Miliana Gangwer MD  01/22/2017   CC: Katherina Mires, Md [address] [place], [place] [address]

## 2017-01-22 NOTE — Addendum Note (Signed)
Addended by: Larey Seat on: 01/22/2017 02:55 PM   Modules accepted: Orders

## 2017-03-30 ENCOUNTER — Telehealth: Payer: Self-pay | Admitting: Neurology

## 2017-03-30 DIAGNOSIS — Z9989 Dependence on other enabling machines and devices: Principal | ICD-10-CM

## 2017-03-30 DIAGNOSIS — G4733 Obstructive sleep apnea (adult) (pediatric): Secondary | ICD-10-CM

## 2017-03-30 NOTE — Telephone Encounter (Signed)
I called pt. She reports that she cannot find the nose piece to her cpap machine. I advised her that I will send an order to [facility] so that they may replace this piece as necessary. Pt verbalized understanding.

## 2017-03-30 NOTE — Telephone Encounter (Signed)
Patient called stating she has lost a part to her nose piece on the cpap machine.  She would like for someone to call her back

## 2017-03-30 NOTE — Addendum Note (Signed)
Addended by: Lester Daingerfield A on: 03/30/2017 04:52 PM   Modules accepted: Orders

## 2017-06-29 ENCOUNTER — Ambulatory Visit (INDEPENDENT_AMBULATORY_CARE_PROVIDER_SITE_OTHER): Payer: Medicare Other

## 2017-06-29 ENCOUNTER — Ambulatory Visit (INDEPENDENT_AMBULATORY_CARE_PROVIDER_SITE_OTHER): Payer: Medicare Other | Admitting: Surgery

## 2017-06-29 ENCOUNTER — Telehealth (INDEPENDENT_AMBULATORY_CARE_PROVIDER_SITE_OTHER): Payer: Self-pay | Admitting: Orthopedic Surgery

## 2017-06-29 ENCOUNTER — Encounter (INDEPENDENT_AMBULATORY_CARE_PROVIDER_SITE_OTHER): Payer: Self-pay | Admitting: Surgery

## 2017-06-29 VITALS — BP 131/61 | HR 61 | Temp 97.1°F | Ht 64.5 in | Wt 215.0 lb

## 2017-06-29 DIAGNOSIS — S300XXA Contusion of lower back and pelvis, initial encounter: Secondary | ICD-10-CM | POA: Diagnosis not present

## 2017-06-29 DIAGNOSIS — G8929 Other chronic pain: Secondary | ICD-10-CM | POA: Diagnosis not present

## 2017-06-29 DIAGNOSIS — M545 Low back pain: Secondary | ICD-10-CM

## 2017-06-29 MED ORDER — TRAMADOL HCL 50 MG PO TABS: 50.0000 mg | ORAL_TABLET | Freq: Three times a day (TID) | ORAL | 0 refills | Status: DC | PRN

## 2017-06-29 NOTE — Telephone Encounter (Signed)
Called patient left message appointment changed to 07/13/17 at 1:00pm

## 2017-07-06 ENCOUNTER — Telehealth (INDEPENDENT_AMBULATORY_CARE_PROVIDER_SITE_OTHER): Payer: Self-pay | Admitting: Physical Medicine and Rehabilitation

## 2017-07-06 NOTE — Telephone Encounter (Signed)
Note from [facility] on that day not in the chart. If the patient is having axial low back pain which seemingly is return of her prior complaints we could repeat the facet joint blocks. I did review the x-ray images the Jeneen Rinks took it is no fractures or dislocations. However if she is having more coccydynia Ortel bone pain we may want to see her as an office visit. She is seeing Dr. Marlou Sa on the 27th that may be the right thing to do is have him look at her first.

## 2017-07-06 NOTE — Telephone Encounter (Signed)
Patient came in stating that she wants to see Dr. Ernestina Patches because he is the one that helped her with her back the last time.  Please call patient.  CB# [phone].  Thank you.

## 2017-07-07 NOTE — Telephone Encounter (Signed)
Patient will keep appointment with Dr. Marlou Sa since our next available appointments are not until after that date.

## 2017-07-08 ENCOUNTER — Ambulatory Visit (INDEPENDENT_AMBULATORY_CARE_PROVIDER_SITE_OTHER): Payer: Medicare Other | Admitting: Orthopedic Surgery

## 2017-07-13 ENCOUNTER — Encounter (INDEPENDENT_AMBULATORY_CARE_PROVIDER_SITE_OTHER): Payer: Self-pay | Admitting: Orthopedic Surgery

## 2017-07-13 ENCOUNTER — Ambulatory Visit (INDEPENDENT_AMBULATORY_CARE_PROVIDER_SITE_OTHER): Payer: Medicare Other | Admitting: Orthopedic Surgery

## 2017-07-13 DIAGNOSIS — M545 Low back pain: Secondary | ICD-10-CM

## 2017-07-13 MED ORDER — HYDROCODONE-ACETAMINOPHEN 5-325 MG PO TABS: 1.0000 | ORAL_TABLET | Freq: Two times a day (BID) | ORAL | 0 refills | Status: DC | PRN

## 2017-07-15 NOTE — Progress Notes (Signed)
Office Visit Note   Patient: Amanda [name]           Date of Birth: Jun 21, 1946           MRN: [id number] Visit Date: 07/13/2017 Requested by: Aretta Nip, [place], [place] [address] PCP: Aretta Nip, MD  Subjective: Chief Complaint  Patient presents with  . Follow-up    s/p fall 2wks ago-c/o increased pain in mid-low back and sacral area    HPI: [name] is a patient who fell 2 weeks ago.  She was seen and had radiographs of the lumbar and thoracic spine performed.  They were interpreted as being normal.  She may have a minimal upper lumbar compression fracture.  She was given Ultram for pain without relief.  She states the pain is getting worse and is worse when she is up and plating.  States the pain is constant.  She cannot get comfortable.  She ambulates with a cane.  Denies any bowel or bladder symptoms.              ROS: All systems reviewed are negative as they relate to the chief complaint within the history of present illness.  Patient denies  fevers or chills.   Assessment & Plan: Visit Diagnoses:  1. Acute midline low back pain, with sciatica presence unspecified     Plan: Impression is low back pain with possible compression fracture based on mechanism of injury and subtle radiographic findings.  I will give her a lumbar corset and prescription for a walker.  Needs MRI lumbar spine to evaluate for fracture.  See her back after that study  Follow-Up Instructions: Return if symptoms worsen or fail to improve.   Orders:  Orders Placed This Encounter  Procedures  . MR Lumbar Spine w/o contrast   Meds ordered this encounter  Medications  . HYDROcodone-acetaminophen (NORCO/VICODIN) 5-325 MG tablet    Sig: Take 1 tablet by mouth every 12 (twelve) hours as needed for moderate pain.    Dispense:  30 tablet    Refill:  0      Procedures: No procedures performed   Clinical Data: No additional findings.  Objective: Vital  Signs: There were no vitals taken for this visit.  Physical Exam:   Constitutional: Patient appears well-developed HEENT:  Head: Normocephalic Eyes:EOM are normal Neck: Normal range of motion Cardiovascular: Normal rate Pulmonary/chest: Effort normal Neurologic: Patient is alert Skin: Skin is warm Psychiatric: Patient has normal mood and affect    Ortho Exam: Orthopedic exam demonstrates full active and passive range of motion of the legs with no definite nerve root tension signs.  Pedal pulses intact.  No groin pain with internal/external rotation leg.  Patient has fairly significant pain with forward and lateral bending but no trochanteric tenderness.  Does have pain in the midline.  Specialty Comments:  No specialty comments available.  Imaging: No results found.   PMFS History: Patient Active Problem List   Diagnosis Date Noted  . 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 Acute bronchitis due to Streptococcus 11/22/2015  . OSA on CPAP 11/22/2015  . Depression (emotion) 11/22/2015  . Abnormal CT scan   . Hematoma of left psoas region to anticoagulant therapy 10/11/2014  . Coagulopathy ([facility]) 10/11/2014  . Psoas hematoma, left, secondary to anticoagulant therapy 10/11/2014  . Acute hyponatremia 10/11/2014  . S/P total knee arthroplasty 09/24/2014  . Hypertension   . GERD (gastroesophageal reflux disease)   . Diabetes type 2, controlled (  [facility])   . Depression   . Hyperlipidemia   . 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 Cough 08/31/2013   Past Medical History:  Diagnosis Date  . Anemia due to blood loss   . Anxiety   . Arthritis   . Asthma    seasonal or with bronchitis  . Complication of anesthesia    "forgets to breathe" when she wakes up  . Depression   . Diabetes type 2, controlled ([facility])    fasting 100-120  . GERD (gastroesophageal reflux disease)   . Headache   . Heart murmur   . Hepatitis    A -- as teenager  . Hyperlipidemia   . Hypertension   . Hypothyroidism   .  Mitral valve prolapse   . OSA on CPAP   . Pneumonia    hx of  . Vertigo     Family History  Problem Relation Age of Onset  . Cancer Mother        panceratic  . Cancer - Colon Mother     Past Surgical History:  Procedure Laterality Date  . ABDOMINAL HYSTERECTOMY    . CHOLECYSTECTOMY    . FRACTURE SURGERY Right    leg  . GALLBLADDER SURGERY  1995  . JOINT REPLACEMENT Left    knee  . NASAL SINUS SURGERY  2005  . PARTIAL HYSTERECTOMY  1983  . THROAT SURGERY  2005  . TOTAL KNEE ARTHROPLASTY Right 09/12/2014   Procedure: TOTAL KNEE ARTHROPLASTY;  Surgeon: Meredith Pel, MD;  Location: [facility];  Service: Orthopedics;  Laterality: Right;   Social History   Occupational History  . retired     Secondary school teacher   Social History Main Topics  . Smoking status: Never Smoker  . Smokeless tobacco: Never Used  . Alcohol use No     Comment: socially  . Drug use: No  . Sexual activity: Not on file

## 2017-07-29 ENCOUNTER — Other Ambulatory Visit (INDEPENDENT_AMBULATORY_CARE_PROVIDER_SITE_OTHER): Payer: Self-pay

## 2017-07-29 MED ORDER — HYDROCODONE-ACETAMINOPHEN 5-325 MG PO TABS: 1.0000 | ORAL_TABLET | Freq: Two times a day (BID) | ORAL | 0 refills | Status: DC | PRN

## 2017-07-30 ENCOUNTER — Ambulatory Visit
Admission: RE | Admit: 2017-07-30 | Discharge: 2017-07-30 | Disposition: A | Discharge status: Home / Self Care | Payer: Medicare Other | Source: Ambulatory Visit | Attending: Orthopedic Surgery | Admitting: Orthopedic Surgery

## 2017-07-30 ENCOUNTER — Other Ambulatory Visit: Payer: Medicare Other

## 2017-07-30 DIAGNOSIS — M545 Low back pain: Secondary | ICD-10-CM

## 2017-08-03 ENCOUNTER — Encounter (INDEPENDENT_AMBULATORY_CARE_PROVIDER_SITE_OTHER): Payer: Self-pay | Admitting: Orthopedic Surgery

## 2017-08-03 ENCOUNTER — Ambulatory Visit (INDEPENDENT_AMBULATORY_CARE_PROVIDER_SITE_OTHER): Payer: Medicare Other | Admitting: Orthopedic Surgery

## 2017-08-03 ENCOUNTER — Other Ambulatory Visit: Payer: Medicare Other

## 2017-08-03 DIAGNOSIS — M5441 Lumbago with sciatica, right side: Secondary | ICD-10-CM | POA: Diagnosis not present

## 2017-08-03 DIAGNOSIS — M5442 Lumbago with sciatica, left side: Secondary | ICD-10-CM

## 2017-08-03 MED ORDER — ACETAMINOPHEN-CODEINE #3 300-30 MG PO TABS: 1.0000 | ORAL_TABLET | Freq: Three times a day (TID) | ORAL | 0 refills | Status: DC | PRN

## 2017-08-06 NOTE — Progress Notes (Signed)
Office Visit Note   Patient: Amanda [name]           Date of Birth: 11/22/1945           MRN: [id number] Visit Date: 08/03/2017 Requested by: Aretta Nip, [place], [place] [address] PCP: Aretta Nip, MD  Subjective: Chief Complaint  Patient presents with  . Lower Back - Follow-up    HPI: Patient is here for MRI scan review of her lumbar spine.  She does have a subacute 10% L4 compression fracture.  Brace helps some.  She has all of the pain in her back and buttocks.  She fell 4-5 weeks ago.  She is taking no pain meds except hydrocodone.  Denies any history of osteoporosis.  Cataract surgery is pending.              ROS: All systems reviewed are negative as they relate to the chief complaint within the history of present illness.  Patient denies  fevers or chills.   Assessment & Plan: Visit Diagnoses:  1. Acute bilateral low back pain with bilateral sciatica     Plan: Impression is L4 compression fracture subacute.  Plan is to continue with non-operative treatment.  Tylenol 3 prescribed.  Kidney with mobilization as tolerated.  Pain should be improving over the next few weeks.  Follow-up with me as needed  Follow-Up Instructions: Return if symptoms worsen or fail to improve.   Orders:  No orders of the defined types were placed in this encounter.  Meds ordered this encounter  Medications  . acetaminophen-codeine (TYLENOL #3) 300-30 MG tablet    Sig: Take 1 tablet by mouth every 8 (eight) hours as needed for moderate pain.    Dispense:  30 tablet    Refill:  0      Procedures: No procedures performed   Clinical Data: No additional findings.  Objective: Vital Signs: There were no vitals taken for this visit.  Physical Exam:   Constitutional: Patient appears well-developed HEENT:  Head: Normocephalic Eyes:EOM are normal Neck: Normal range of motion Cardiovascular: Normal rate Pulmonary/chest: Effort normal Neurologic:  Patient is alert Skin: Skin is warm Psychiatric: Patient has normal mood and affect    Ortho Exam: Orthopedic exam demonstrates full active and passive range of motion of the knees hips and ankles.  No nerve retention signs.  Patient does have pain with forward and lateral bending but no trochanteric tenderness.  No paresthesias L1-S1 bilaterally.  No other masses lymph adenopathy or skin changes noted in the back.  Specialty Comments:  No specialty comments available.  Imaging: No results found.   PMFS History: Patient Active Problem List   Diagnosis Date Noted  . 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 Acute bronchitis due to Streptococcus 11/22/2015  . OSA on CPAP 11/22/2015  . Depression (emotion) 11/22/2015  . Abnormal CT scan   . Hematoma of left psoas region to anticoagulant therapy 10/11/2014  . Coagulopathy ([facility]) 10/11/2014  . Psoas hematoma, left, secondary to anticoagulant therapy 10/11/2014  . Acute hyponatremia 10/11/2014  . S/P total knee arthroplasty 09/24/2014  . Hypertension   . GERD (gastroesophageal reflux disease)   . Diabetes type 2, controlled ([facility])   . Depression   . Hyperlipidemia   . 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 Cough 08/31/2013   Past Medical History:  Diagnosis Date  . Anemia due to blood loss   . Anxiety   . Arthritis   . Asthma  seasonal or with bronchitis  . Complication of anesthesia    "forgets to breathe" when she wakes up  . Depression   . Diabetes type 2, controlled ([facility])    fasting 100-120  . GERD (gastroesophageal reflux disease)   . Headache   . Heart murmur   . Hepatitis    A -- as teenager  . Hyperlipidemia   . Hypertension   . Hypothyroidism   . Mitral valve prolapse   . OSA on CPAP   . Pneumonia    hx of  . Vertigo     Family History  Problem Relation Age of Onset  . Cancer Mother        panceratic  . Cancer - Colon Mother     Past Surgical History:  Procedure Laterality Date  . ABDOMINAL HYSTERECTOMY    .  CHOLECYSTECTOMY    . FRACTURE SURGERY Right    leg  . GALLBLADDER SURGERY  1995  . JOINT REPLACEMENT Left    knee  . NASAL SINUS SURGERY  2005  . PARTIAL HYSTERECTOMY  1983  . THROAT SURGERY  2005  . TOTAL KNEE ARTHROPLASTY Right 09/12/2014   Procedure: TOTAL KNEE ARTHROPLASTY;  Surgeon: Meredith Pel, MD;  Location: [facility];  Service: Orthopedics;  Laterality: Right;   Social History   Occupational History  . retired     Secondary school teacher   Social History Main Topics  . Smoking status: Never Smoker  . Smokeless tobacco: Never Used  . Alcohol use No     Comment: socially  . Drug use: No  . Sexual activity: Not on file

## 2017-08-13 ENCOUNTER — Telehealth (INDEPENDENT_AMBULATORY_CARE_PROVIDER_SITE_OTHER): Payer: Self-pay | Admitting: Orthopedic Surgery

## 2017-08-13 NOTE — Telephone Encounter (Signed)
Please advise thanks.

## 2017-08-13 NOTE — Telephone Encounter (Signed)
Patient called asked when does Dr Marlou Sa want her to stop wearing the back brace. The number to contact patient is [phone]

## 2017-08-13 NOTE — Telephone Encounter (Signed)
Back brace for comfort only dc when ready to dc

## 2017-08-14 NOTE — Telephone Encounter (Signed)
I called patient and advised. She states that she is not ready to D/C the brace yet because it helps so much. I advised that this is ok, it is for comfort.

## 2017-10-16 ENCOUNTER — Other Ambulatory Visit: Payer: Self-pay | Admitting: Family Medicine

## 2017-10-16 DIAGNOSIS — Z1231 Encounter for screening mammogram for malignant neoplasm of breast: Secondary | ICD-10-CM

## 2017-10-28 ENCOUNTER — Encounter (HOSPITAL_COMMUNITY): Payer: Self-pay | Admitting: *Deleted

## 2017-10-28 ENCOUNTER — Emergency Department (HOSPITAL_COMMUNITY)
Admission: EM | Admit: 2017-10-28 | Discharge: 2017-10-28 | Disposition: A | Discharge status: Home / Self Care | Payer: Medicare Other | Attending: Emergency Medicine | Admitting: Emergency Medicine

## 2017-10-28 DIAGNOSIS — M549 Dorsalgia, unspecified: Secondary | ICD-10-CM | POA: Insufficient documentation

## 2017-10-28 DIAGNOSIS — Z5321 Procedure and treatment not carried out due to patient leaving prior to being seen by health care provider: Secondary | ICD-10-CM | POA: Insufficient documentation

## 2017-10-28 NOTE — ED Triage Notes (Signed)
Per EMS, pt complains of right hip and back pain. Pt broke her tailbone 2-3 months ago and has had some pain since.

## 2017-10-28 NOTE — ED Triage Notes (Signed)
PT no response when called for pt room.

## 2017-10-28 NOTE — ED Notes (Signed)
Called for vitals and no response

## 2017-10-28 NOTE — ED Notes (Signed)
Pt called and no response

## 2017-10-29 ENCOUNTER — Ambulatory Visit: Payer: Medicare Other

## 2017-11-13 NOTE — Progress Notes (Signed)
Office Visit Note   Patient: Amanda [name]           Date of Birth: June 06, 1946           MRN: [id number] Visit Date: 06/29/2017              Requested by: Katherina Mires, MD [address] [place], [place] [address] PCP: Aretta Nip, MD   Assessment & Plan: Visit Diagnoses:  1. Chronic bilateral low back pain, with sciatica presence unspecified   2. Lumbar contusion, initial encounter     Plan: At this point for lumbar contusion recommend conservative management. Patient will use ice or heat. Follow-up in a couple weeks for recheck. If she still continues to have ongoing pain may consider getting an MRI scan lumbar spine. Return sooner if needed.  Follow-Up Instructions: Return in about 2 weeks (around 07/13/2017).   Orders:  Orders Placed This Encounter  Procedures  . XR Lumbar Spine 2-3 Views  . XR Thoracic Spine 2 View  . XR Sacrum/Coccyx   Meds ordered this encounter  Medications  . traMADol (ULTRAM) 50 MG tablet    Sig: Take 1 tablet (50 mg total) by mouth every 8 (eight) hours as needed.    Dispense:  60 tablet    Refill:  0      Procedures: No procedures performed   Clinical Data: No additional findings.   Subjective: Chief Complaint  Patient presents with  . Lower Back - Pain    HPI Patient comes in today with complaints of back pain. States that a week ago she was going downstairs and fell landing on her back. Denies lower extremity radicular symptoms. 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 Last MRI was done 06/01/2016. Some pain with bending and twisting. Review of Systems No current cardiopulmonary GI GU issues  Objective: Vital Signs: BP 131/61 (BP Location: Right Arm, Patient Position: Sitting, Cuff Size: Large)   Pulse 61   Temp (!) 97.1 F (36.2 C) (Oral)   Ht 5' 4.5" (1.638 m)   Wt 215 lb (97.5 kg)   BMI 36.33 kg/m   Physical Exam  Constitutional: She is oriented to person, place, and  time. No distress.  HENT:  Head: Normocephalic and atraumatic.  Eyes: EOM are normal. Pupils are equal, round, and reactive to light.  Pulmonary/Chest: No respiratory distress.  Musculoskeletal:  Gait is normal. She is mild lumbar paraspinal tenderness. Good lumbar range motion. Negative straight leg raise. Negative logroll bilateral hips. He has intact. No focal motor deficits.  Neurological: She is alert and oriented to person, place, and time.  Skin: Skin is warm and dry.  Psychiatric: She has a normal mood and affect.    Ortho Exam  Specialty Comments:  No specialty comments available.  Imaging: No results found.   PMFS History: Patient Active Problem List   Diagnosis Date Noted  . 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 Acute bronchitis due to Streptococcus 11/22/2015  . OSA on CPAP 11/22/2015  . Depression (emotion) 11/22/2015  . Abnormal CT scan   . Hematoma of left psoas region to anticoagulant therapy 10/11/2014  . Coagulopathy ([facility]) 10/11/2014  . Psoas hematoma, left, secondary to anticoagulant therapy 10/11/2014  . Acute hyponatremia 10/11/2014  . S/P total knee arthroplasty 09/24/2014  . Hypertension   . GERD (gastroesophageal reflux disease)   . Diabetes type 2, controlled ([facility])   . Depression   . Hyperlipidemia   . Arthritis of knee  09/12/2014  . Cough 08/31/2013   Past Medical History:  Diagnosis Date  . Anemia due to blood loss   . Anxiety   . Arthritis   . Asthma    seasonal or with bronchitis  . Complication of anesthesia    "forgets to breathe" when she wakes up  . Depression   . Diabetes type 2, controlled ([facility])    fasting 100-120  . GERD (gastroesophageal reflux disease)   . Headache   . Heart murmur   . Hepatitis    A -- as teenager  . Hyperlipidemia   . Hypertension   . Hypothyroidism   . Mitral valve prolapse   . OSA on CPAP   . Pneumonia    hx of  . Vertigo     Family History  Problem Relation Age of Onset  . Cancer Mother         panceratic  . Cancer - Colon Mother     Past Surgical History:  Procedure Laterality Date  . ABDOMINAL HYSTERECTOMY    . CHOLECYSTECTOMY    . FRACTURE SURGERY Right    leg  . GALLBLADDER SURGERY  1995  . JOINT REPLACEMENT Left    knee  . NASAL SINUS SURGERY  2005  . PARTIAL HYSTERECTOMY  1983  . THROAT SURGERY  2005  . TOTAL KNEE ARTHROPLASTY Right 09/12/2014   Procedure: TOTAL KNEE ARTHROPLASTY;  Surgeon: Meredith Pel, MD;  Location: [facility];  Service: Orthopedics;  Laterality: Right;   Social History   Occupational History  . Occupation: retired    Comment: Secondary school teacher  Tobacco Use  . Smoking status: Never Smoker  . Smokeless tobacco: Never Used  Substance and Sexual Activity  . Alcohol use: No    Comment: socially  . Drug use: No  . Sexual activity: Not on file

## 2017-11-25 ENCOUNTER — Telehealth (INDEPENDENT_AMBULATORY_CARE_PROVIDER_SITE_OTHER): Payer: Self-pay

## 2017-11-25 NOTE — Telephone Encounter (Signed)
Patient would like a handicap placard.  Cb# is [phone].  Please advise.

## 2017-11-25 NOTE — Telephone Encounter (Signed)
6 mos

## 2017-11-25 NOTE — Telephone Encounter (Signed)
[place] for placard? If so temp or permanent?

## 2017-11-25 NOTE — Telephone Encounter (Signed)
IC LM advising could pick up in the morning.

## 2017-12-01 ENCOUNTER — Ambulatory Visit (INDEPENDENT_AMBULATORY_CARE_PROVIDER_SITE_OTHER): Payer: Medicare Other

## 2017-12-01 ENCOUNTER — Ambulatory Visit (INDEPENDENT_AMBULATORY_CARE_PROVIDER_SITE_OTHER): Payer: Medicare Other | Admitting: Orthopaedic Surgery

## 2017-12-01 ENCOUNTER — Encounter (INDEPENDENT_AMBULATORY_CARE_PROVIDER_SITE_OTHER): Payer: Self-pay | Admitting: Orthopaedic Surgery

## 2017-12-01 DIAGNOSIS — M7061 Trochanteric bursitis, right hip: Secondary | ICD-10-CM

## 2017-12-01 MED ORDER — METHYLPREDNISOLONE ACETATE 40 MG/ML IJ SUSP
40.0000 mg | INTRAMUSCULAR | Status: AC | PRN
  Administered 2017-12-01: 40 mg via INTRA_ARTICULAR

## 2017-12-01 MED ORDER — BUPIVACAINE HCL 0.25 % IJ SOLN
2.0000 mL | INTRAMUSCULAR | Status: AC | PRN
  Administered 2017-12-01: 2 mL via INTRA_ARTICULAR

## 2017-12-01 MED ORDER — LIDOCAINE HCL 1 % IJ SOLN
3.0000 mL | INTRAMUSCULAR | Status: AC | PRN
Start: 2017-12-01 — End: 2017-12-01
  Administered 2017-12-01: 3 mL

## 2017-12-01 NOTE — Progress Notes (Signed)
xr

## 2017-12-01 NOTE — Progress Notes (Signed)
Office Visit Note   Patient: Amanda [name]           Date of Birth: 1946/01/27           MRN: [id number] Visit Date: 12/01/2017              Requested by: Aretta Nip, [place], [place] [address] PCP: Aretta Nip, MD   Assessment & Plan: Visit Diagnoses:  1. Trochanteric bursitis of right hip     Plan: Impression acute traumatic trochanteric bursitis of the right hip.  At today's visit we injected her right trochanteric bursa.  I am very hopeful this will help relieve her symptoms.  She is not any better in the next few weeks she will call and let us know.  Follow-Up Instructions: Return if symptoms worsen or fail to improve.   Orders:  Orders Placed This Encounter  Procedures  . Large Joint Inj: R greater trochanter  . XR Lumbar Spine 2-3 Views  . XR HIP UNILAT W OR W/O PELVIS 1V RIGHT   No orders of the defined types were placed in this encounter.     Procedures: Large Joint Inj: R greater trochanter on 12/01/2017 2:59 PM Indications: pain Details: 22 G needle, lateral approach Medications: 2 mL bupivacaine 0.25 %; 3 mL lidocaine 1 %; 40 mg methylPREDNISolone acetate 40 MG/ML      Clinical Data: No additional findings.   Subjective: Chief Complaint  Patient presents with  . Lower Back - Pain    HPI Amanda [name] is a pleasant 72 year old female who presents to our clinic today with new onset pain to the right hip.  This began after a fall this past Saturday night 11/28/2017.  She got dizzy from her vertigo and fell on the lip of her shower landing on the right lateral hip.  Since then she has lateral aspect of the right hip.  Worse lying on her side as well as going from a sitting to standing position.  Pain is better sitting or standing.  No numbness tingling burning.  Of note she saw Dr. Marlou Sa back in September 2018 for an L4 compression fracture.  She notes minimal pain to her back at today's visit..  Review of Systems as  detailed in HPI.  All others reviewed and are negative.   Objective: Vital Signs: There were no vitals taken for this visit.  Physical Exam well-developed well-nourished female in no acute distress.  Alert and oriented x3.  Ortho Exam examination of her right hip reveals marked tenderness over the trochanteric bursa.  Negative logroll.  Negative straight leg raise.   no spinous/paraspinous tenderness to the lumbar spine.  She is nervous intact distally.  Specialty Comments:  No specialty comments available.  Imaging: Xr Hip Unilat W Or W/o Pelvis 1v Right  Result Date: 12/01/2017 X-rays of her hip are negative for fracture.  Xr Lumbar Spine 2-3 Views  Result Date: 12/01/2017 X-rays reveal a healing compression fracture at L4.    PMFS History: Patient Active Problem List   Diagnosis Date Noted  . Trochanteric bursitis of right hip 12/01/2017  . 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 Acute bronchitis due to Streptococcus 11/22/2015  . OSA on CPAP 11/22/2015  . Depression (emotion) 11/22/2015  . Abnormal CT scan   . Hematoma of left psoas region to anticoagulant therapy 10/11/2014  . Coagulopathy ([facility]) 10/11/2014  . Psoas hematoma, left, secondary to anticoagulant therapy 10/11/2014  . Acute hyponatremia 10/11/2014  . S/P  total knee arthroplasty 09/24/2014  . Hypertension   . GERD (gastroesophageal reflux disease)   . Diabetes type 2, controlled ([facility])   . Depression   . Hyperlipidemia   . 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 Cough 08/31/2013   Past Medical History:  Diagnosis Date  . Anemia due to blood loss   . Anxiety   . Arthritis   . Asthma    seasonal or with bronchitis  . Complication of anesthesia    "forgets to breathe" when she wakes up  . Depression   . Diabetes type 2, controlled ([facility])    fasting 100-120  . GERD (gastroesophageal reflux disease)   . Headache   . Heart murmur   . Hepatitis    A -- as teenager  . Hyperlipidemia   . Hypertension   .  Hypothyroidism   . Mitral valve prolapse   . OSA on CPAP   . Pneumonia    hx of  . Vertigo     Family History  Problem Relation Age of Onset  . Cancer Mother        panceratic  . Cancer - Colon Mother     Past Surgical History:  Procedure Laterality Date  . ABDOMINAL HYSTERECTOMY    . CHOLECYSTECTOMY    . FRACTURE SURGERY Right    leg  . GALLBLADDER SURGERY  1995  . JOINT REPLACEMENT Left    knee  . NASAL SINUS SURGERY  2005  . PARTIAL HYSTERECTOMY  1983  . THROAT SURGERY  2005  . TOTAL KNEE ARTHROPLASTY Right 09/12/2014   Procedure: TOTAL KNEE ARTHROPLASTY;  Surgeon: Meredith Pel, MD;  Location: [facility];  Service: Orthopedics;  Laterality: Right;   Social History   Occupational History  . Occupation: retired    Comment: Secondary school teacher  Tobacco Use  . Smoking status: Never Smoker  . Smokeless tobacco: Never Used  Substance and Sexual Activity  . Alcohol use: No    Comment: socially  . Drug use: No  . Sexual activity: Not on file

## 2018-01-25 ENCOUNTER — Encounter: Payer: Self-pay | Admitting: Neurology

## 2018-01-27 ENCOUNTER — Ambulatory Visit: Payer: Medicare Other | Admitting: Neurology

## 2018-01-27 ENCOUNTER — Encounter: Payer: Self-pay | Admitting: Neurology

## 2018-01-27 VITALS — BP 150/61 | HR 68 | Ht 64.0 in | Wt 194.0 lb

## 2018-01-27 DIAGNOSIS — G4733 Obstructive sleep apnea (adult) (pediatric): Secondary | ICD-10-CM | POA: Diagnosis not present

## 2018-01-27 DIAGNOSIS — Z9114 Patient's other noncompliance with medication regimen: Secondary | ICD-10-CM | POA: Diagnosis not present

## 2018-01-27 DIAGNOSIS — Z9989 Dependence on other enabling machines and devices: Secondary | ICD-10-CM | POA: Diagnosis not present

## 2018-01-27 NOTE — Progress Notes (Signed)
SLEEP MEDICINE CLINIC   Provider:  Larey Seat, M D  Referring Provider: Katherina Mires, MD Primary Care Physician:  Aretta Nip, MD  Chief Complaint  Patient presents with  . Follow-up    pt alone, rm 11, pt states that CPAP working well. DME [facility].     HPI:  Amanda [name] is a 72 y.o. female , today on 01-27-2018 seen in follow up. She has lost a lot of weight and looks well, reports she feels well. She proudly reports she now wears dress size 46 W for bottom and 16 for tops, down from 24. She is very pear shaped.  Amanda [name] is widowed, she has adult children.  Her daughter got divorced after more than 2 years of marriage and re-married recently.  In addition to her 2 grandchildren that live in [place] she also gained 2 step grandchildren. Her son also lives in [place] and has 2 additional children that are 27 and 67 years old.  She has kept active, she is well-groomed, she reports that her memory has been stable. 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   She is not excessively daytime sleepy and only endorsed the Epworth sleepiness score at 2 points, fatigue severity, geriatric depression score, She is here mainly for CPAP compliance which has been higher in the past, this time she had only 18 days out of 30 days with over 4 hours of consecutive use ( 60% compliance).  Average use of time is 4 hours 28 minutes, set pressure of 6 cmH2O was 2 cm EPR and her residual apnea is 3.8/h.  She does seem to have higher than usual air leaks.  She has blisters on her nose form the nasal mask, and she feels she cannot make it tight enough. She has not been happy with nasal pillows in the past. I will first try another mask - may be the fit is not longer well because of weight loss, We can also order a repeat study to make sure she still has any OSA.    Her upper airway is very abnormal, high grade retrognathia, loss of taste and  smell, and status post UPPP.-we may ask the ENT colleagues to evaluate her. I will discuss the Inspire device with her.       Originally seen here as a referral from Dr. Doreene Nest for a sleep consultation 2015. I have met Amanda [name] many years ago when I took care of her husband who presented with progressive sexual disinhibition in the setting of a frontal lobe dementia.  The patient is here today to be re-evaluated for possible obstructive sleep apnea and treatment options to be discussed. She had some medical complications over the last 24 months. In November 2015 she had a knee replacement on the right and reports that her Coumadin levels were not checked after hospitalization and surgery. She was declining in her overall health became very weak and had finally been diagnosed with retroperitoneal bleed. She had very low red blood cell counts and hematocrits and had to be transfused. Her current medications include metformin, bupropion, trazodone, Effexor, L-thyroxine, atorvastatin, metoprolol, omeprazole, Klonopin, losartan. She lost 78 pounds over the last 2 years,  partially due to her medical complications , I think partially due to the stress with her husband's progressive illness. He no longer lives at home but is now institutionalized. He usually mistakes his wife for his sister.  There have been a lot of financial stressors.  The patient was diagnosed with sleep apnea over 10 years ago. She was placed on CPAP and had used it for many years until it broke and she is now not sure if she will be qualifies or needs a new test to requalify. She wakes up with a dry mouth.  I have a sleep study from 2013 available here that was ordered as a split-night polysomnography. The patient was using CPAP at 14 cm water pressure at the time at home her AHI was 20 and her RDI 28.7. Her lowest oxygen nadir. His 84% in nonsupine non-REM sleep. The patient spent 87 minutes at or below 90% saturation. She was titrated  to only 5 cm water pressure the AHI became 3.1 which led me to believe that she probably needs a slightly higher pressure than this. I will reevaluate the patient.   Sleep habits are as follows: Patient goes to bed around 11 PM tries to rise in the morning at 8 AM not always feeling refreshed or restored enough to do so. She sleeps alone is alone in the house. There has been nobody witnessing her sleep. She does have a dog. She has noted that she falls sleep easier in daytime seems to be easy to doze off. She has seen her family medical physician to evaluate 3 recent falls. The patient has no history of shift work on nighttime work. 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  Usually asleep within 5 minutes , sleeping through the night, rarely have one interruption due to urinary urge. She wakes herself up from sleep snoring she reports has a dry mouth in the morning and sometimes a morning headache. In spite of the significant weight loss over the last 24 months she still considered obese.  I reviewed her recent metabolic panels and she does have an elevated fasting glucose, some microalbuminuria. TSH has been reduced over the last 12 months but is in normal range, hemoglobin A1c 2 months ago was 6.1. The control of her HbA1c also correlated with the loss of weight. Sleep medical history ; see above doagnosed 2004 for the first time, and 2013 re titrated after a SPLIT night study.  Social history: Due to her husband's dementing illness the couple live separated. He had to be institutionalized. Amanda [name] is retired and lives no alone in the families home. She has 2 adult children that are not living close by. She is not a smoker, not a drinker, has no history of illicit drug use.    Revisit from 11/22/2015. Amanda [name] is living alone since her husband had to be confined to a memory loss unit. She has a  tendency to develop wintertime bronchitis and often develops pneumonia on top of that. She has now struggled 3 weeks with bronchitis. She had to stop using the CPAP on December 23 because of this pulmonary underlying condition. For the first 22 days of the month however her compliance was excellent at 93%. She is using the machine on average 6 hours and 56 minutes. Set pressure is 6 cm water was too thin meter EPR the AHI is 4.0 which is good.  The patient was referred for sleep study performed on 08/28/2015. She had endorsed the Epworth sleepiness score at 12/24 points and presented with a BMI of 35.4. Her AHI was 16.3 and her RDI was  19.3/hr  during supine sleep that your back her AHI was 21.2/hr . She did have brief periods of oxygen desaturation. She did have frequent periods of limb movements which also woke her up with an arousal index of 17.8.  I recommended 2 things CPAP at 6 cm water, a nasal mask as long as she has the ability to breathe through the nose  .  As to her frequent limb movements I think they need to be treated with medication. Restless leg syndrome can also be  following iron deficiency, and this patient has IBS. This is our first visit with the patient since her 69th birthday. For this reason I also performed a Montral cognitive assessment test was her. She did failed the trail making test, she was able to perform a cube drawing named ALT 3 animals correctly and to a clock face. She missed 2 points of the delayed recall option and one attention.  Interval history from 01/22/2017, Amanda [name] is meanwhile widowed, and also is living with less stress, there is less financial burden on her and certainly less worry. This is reflected and better sleep, a normalized appetite and high activity levels she has more desire to be socially active and interactive. She is no longer excessively fatigued endorsed the fatigue score at only 21 points, the Epworth sleepiness score at 5 points and the  depression score at 1 out of 15 points. She scored a normal level on the Montral cognitive assessment a 28 given her education, she has a Chiropodist in education. She had to interrupt CPAP use from February 22 through the third of due to nasal congestion. She has used the machine every other day of the month but over the last 90 days has been extremely compliant 87% compliance and an average user time of 6 hours and 19 minutes, the pressure is set at 6 cm water pressure with 2 cm EPR, her residual AHI is 3.3. She did gain a little bit of weight and I think that we need to adjust her pressure by 1 cm to cut down on the residual apnea index.    Montreal Cognitive Assessment  01/22/2017 11/22/2015  Visuospatial/ Executive (0/5) 5 4  Naming (0/3) 3 3  Attention: Read list of digits (0/2) 2 1  Attention: Read list of letters (0/1) 1 1  Attention: Serial 7 subtraction starting at 100 (0/3) 3 3  Language: Repeat phrase (0/2) 2 2  Language : Fluency (0/1) 1 1  Abstraction (0/2) 2 2  Delayed Recall (0/5) 4 3  Orientation (0/6) 5 6  Total 28 26  Adjusted Score (based on education) 28 26   BS degree in education.    Review of Systems: Out of a complete 14 system review, the patient complains of only the following symptoms, and all other reviewed systems are negative.  Snoring, fatigue, EDS, dry mouth, obese,  Coughing,productive cough.  No longer diarrhea.     Social History   Socioeconomic History  . Marital status: Married    Spouse name: Not on file  . Number of children: Not on file  . Years of education: Not on file  . Highest education level: Not on file  Social Needs  . Financial resource strain: Not on file  . Food insecurity - worry: Not on file  . Food insecurity - inability: Not on file  . Transportation needs - medical: Not on file  . Transportation needs - non-medical: Not on file  Occupational  History  . Occupation: retired    Comment: Secondary school teacher    Tobacco Use  . Smoking status: Never Smoker  . Smokeless tobacco: Never Used  Substance and Sexual Activity  . Alcohol use: No    Comment: socially  . Drug use: No  . Sexual activity: Not on file  Other Topics Concern  . Not on file  Social History Narrative  . Not on file    Family History  Problem Relation Age of Onset  . Cancer Mother        panceratic  . Cancer - Colon Mother     Past Medical History:  Diagnosis Date  . Anemia due to blood loss   . Anxiety   . Arthritis   . Asthma    seasonal or with bronchitis  . Complication of anesthesia    "forgets to breathe" when she wakes up  . Depression   . Diabetes type 2, controlled ([place])    fasting 100-120  . GERD (gastroesophageal reflux disease)   . Headache   . Heart murmur   . Hepatitis    A -- as teenager  . Hyperlipidemia   . Hypertension   . Hypothyroidism   . Mitral valve prolapse   . OSA on CPAP   . Pneumonia    hx of  . Vertigo     Past Surgical History:  Procedure Laterality Date  . ABDOMINAL HYSTERECTOMY    . CHOLECYSTECTOMY    . FRACTURE SURGERY Right    leg  . GALLBLADDER SURGERY  1995  . JOINT REPLACEMENT Left    knee  . NASAL SINUS SURGERY  2005  . PARTIAL HYSTERECTOMY  1983  . THROAT SURGERY  2005  . TOTAL KNEE ARTHROPLASTY Right 09/12/2014   Procedure: TOTAL KNEE ARTHROPLASTY;  Surgeon: Meredith Pel, MD;  Location: [facility];  Service: Orthopedics;  Laterality: Right;    Current Outpatient Medications  Medication Sig Dispense Refill  . acetaminophen-codeine (TYLENOL #3) 300-30 MG tablet Take 1 tablet by mouth every 8 (eight) hours as needed for moderate pain. 30 tablet 0  . buPROPion (WELLBUTRIN XL) 300 MG 24 hr tablet Take 1 tablet (300 mg total) by mouth daily. 90 tablet 3  . Cholecalciferol (VITAMIN D3) 1000 UNITS CAPS Take 1,000 Units by mouth daily.     . clonazePAM (KLONOPIN) 0.5 MG tablet Take 1 tablet (0.5 mg total) by mouth daily. 30 tablet 0  . fluticasone (FLONASE)  50 MCG/ACT nasal spray USE 2 SPRAYS EACH NOSTRIL EVERY DAY    . HYDROcodone-acetaminophen (NORCO/VICODIN) 5-325 MG tablet Take 1 tablet by mouth every 12 (twelve) hours as needed for moderate pain. 30 tablet 0  . levothyroxine (SYNTHROID, LEVOTHROID) 25 MCG tablet Take 25 mcg by mouth daily.      Marland Kitchen losartan (COZAAR) 25 MG tablet Take 25 mg by mouth daily.     . metFORMIN (GLUCOPHAGE) 500 MG tablet Take 1 tablet (500 mg total) by mouth 2 (two) times daily with a meal.    . metoprolol tartrate (LOPRESSOR) 25 MG tablet Take 25 mg by mouth 2 (two) times daily.    Marland Kitchen omeprazole (PRILOSEC) 40 MG capsule Take 40 mg by mouth daily.      Marland Kitchen oxybutynin (DITROPAN-XL) 10 MG 24 hr tablet Take 10 mg by mouth daily.  11  . pravastatin (PRAVACHOL) 40 MG tablet Take 40 mg by mouth daily.    . traMADol (ULTRAM) 50 MG tablet TAKE 1 TO 2 TABLETS BY MOUTH  EVERY 4 TO 6 HOURS AS NEEDED FOR PAIN (MAX 8 TABS/DAY)  0  . traMADol (ULTRAM) 50 MG tablet Take 1 tablet (50 mg total) by mouth every 8 (eight) hours as needed. 60 tablet 0  . traZODone (DESYREL) 100 MG tablet Take 100-200 mg by mouth at bedtime. Most of the time takes 100 mg    . venlafaxine (EFFEXOR) 50 MG tablet Take 2 tablets (100 mg total) by mouth daily. 90 tablet 3  . cetirizine (ZYRTEC) 10 MG tablet Take by mouth.     No current facility-administered medications for this visit.     Allergies as of 01/27/2018 - Review Complete 12/01/2017  Allergen Reaction Noted  . Atorvastatin Other (See Comments) 11/22/2015  . Aciphex [rabeprazole sodium] Hives 09/23/2011  . Erythromycin Nausea And Vomiting 09/23/2011  . Macrodantin Nausea And Vomiting 09/23/2011  . Nsaids  08/30/2014  . Omeprazole-sodium bicarbonate [omeprazole-sodium bicarbonate] Hives 09/23/2011  . Sulfa antibiotics Nausea Only and Other (See Comments) 09/23/2011  . Tape Rash 08/31/2014    Vitals: BP (!) 150/61   Pulse 68   Ht '5\' 4"'  (1.626 m)   Wt 194 lb (88 kg)   BMI 33.30 kg/m  Last  Weight:  Wt Readings from Last 1 Encounters:  01/27/18 194 lb (88 kg)   ZOX:WRUE mass index is 33.3 kg/m.     Last Height:   Ht Readings from Last 1 Encounters:  01/27/18 '5\' 4"'  (1.626 m)    Physical exam:  General: The patient is awake, alert and appears not in acute distress. The patient is well groomed. Head: Normocephalic, atraumatic. Neck is supple. Mallampati 3,  neck circumference: 15 . Nasal airflow unrestricted , TMJ click is evident .  Retrognathia high grade.   Cardiovascular:  Deferred. VS taken BMI  33.3 from 36. 3 last year  Pear shaped body,   Neurologic exam : The patient is awake and alert, oriented to place and time.  She reports not having any desire to cook or eat, not to clean or iron. She appears depressed.  Memory subjective described as intact. MOCA 28 -30 last year, patient declined repeat testing this year  .  Speech is fluent, with dysphonia or aphasia.Mood and affect are appropriate. Cranial nerves: Pupils are equal and briskly reactive to light. Visual fields by finger perimetry are intact. Hearing to finger rub intact.  Facial motor strength is symmetric , with the  tongue and uvula in midline.  Shoulder shrug was symmetrical. The patient has droopy shoulders. Finger-to-nose maneuver normal without evidence of ataxia, dysmetria or tremor. Patient walks without assistive device .  She walks with a wider base. Strength within normal limits.  Deep tendon reflexes: in the upper and lower extremities are symmetric and intact. Babinski maneuver response is downgoing.  The patient was advised of the nature of the diagnosed sleep disorder, the treatment options and risks for general a health and wellness arising from not treating the condition.  I spent more than 30 minutes of face to face time with the patient. Greater than 50% of time was spent in counseling and coordination of care. We have discussed the diagnosis and differential and I answered the patient's  questions.     1) OSA On CPAP, patient was very obese at the time, has 2 sleep studies in 2013 and 2016. She lost a lot of weight - may need to recheck. The patient will be send to [facility] for refitting. I ordered a SPLIT study  Assessment:  After physical and neurologic examination, review of laboratory studies,  Personal review of imaging studies, reports of other /same  Imaging studies ,  Results of polysomnography/ neurophysiology testing and pre-existing records as far as provided in visit., my assessment is   1) Amanda [name] had OSA;  CPAP based on the November 2015 PSG and Split. She is not using the CPAP compliantly because her mask doesn't fit.   2) Normal MOCA score- she is not as easiely distracted anymore. 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   3) if not longer having apnea, will d/c CPAP, and if she still has OSA will offer her the alternative of INSPIRE treatment.     Asencion Partridge Adil Tugwell MD  01/27/2018   CC: Katherina Mires, Md [address], [place] [address]

## 2018-02-14 ENCOUNTER — Ambulatory Visit (INDEPENDENT_AMBULATORY_CARE_PROVIDER_SITE_OTHER): Payer: Medicare Other | Admitting: Neurology

## 2018-02-14 DIAGNOSIS — G4733 Obstructive sleep apnea (adult) (pediatric): Secondary | ICD-10-CM

## 2018-02-14 DIAGNOSIS — Z9114 Patient's other noncompliance with medication regimen: Secondary | ICD-10-CM

## 2018-02-14 DIAGNOSIS — Z9989 Dependence on other enabling machines and devices: Secondary | ICD-10-CM

## 2018-02-17 NOTE — Addendum Note (Signed)
Addended by: Larey Seat on: 02/17/2018 05:51 PM   Modules accepted: Orders

## 2018-02-17 NOTE — Procedures (Signed)
PATIENT'S NAME:  Amanda [name], Amanda [name]    DOB:      [birth year]      MR#:    [id number]     DATE OF RECORDING: 02/14/2018 REFERRING M.D.:  Amanda [name], M.D. Study Performed:  Split-Night Titration Study HISTORY:  Amanda [name] is a 72 y.o. female patient who has lost a lot of weight and looks well, reports she feels well. She is here mainly for CPAP compliance which has been higher in the past, this time she had only 18 days out of 30 days with over 4 hours of consecutive use ( 60% compliance).  Average use of time is 4 hours 28 minutes, set pressure of 6 cmH2O was 2 cm EPR and her residual apnea is 3.8/h.  She does seem to have higher than usual air leaks.  She has blisters on her nose form the nasal mask, and she feels she cannot make it tight enough. She has not been happy with nasal pillows fit -perhaps because of weight loss. I ordered a repeat study to make sure she still has any OSA.   The patient endorsed the Epworth Sleepiness Scale at 2 points  The patient's weight 194 pounds with a height of 64 (inches), resulting in a BMI of 33.1 kg/m2.The patient's neck circumference measured 15 inches.  CURRENT MEDICATIONS: Tylenol #3, Wellbutrin, Klonopin, Flonase, Norco, Synthroid, Cozaar, Metformin, Lopressor, Prilosec, Ditropan, Pravachol, Ultram, Desyrel, Effexor, Zyrtec  PROCEDURE:  This is a multichannel digital polysomnogram utilizing the SomnoStar 11.2 system.  Electrodes and sensors were applied and monitored per AASM Specifications.   EEG, EOG, Chin and Limb EMG, were sampled at 200 Hz.  ECG, Snore and Nasal Pressure, Thermal Airflow, Respiratory Effort, CPAP Flow and Pressure, Oximetry was sampled at 50 Hz. Digital video and audio were recorded.      BASELINE STUDY WITHOUT CPAP RESULTS: Lights Out was at 21:00 and Lights On at 05:01. Total recording time (TRT) was 189, with a total sleep time (TST) of 98.5 minutes.   The patient's sleep latency was 56 minutes.  REM latency was 0 minutes.  The sleep  efficiency was 52.1 %.    SLEEP ARCHITECTURE: WASO (Wake after sleep onset) was 51 minutes, Stage N1 was 33 minutes, Stage N2 was 26.5 minutes, Stage N3 was 39 minutes and Stage R (REM sleep) was 0 minutes. The percentages were Stage N1 33.5%, Stage N2 26.9%, Stage N3 39.6%, and Stage R (REM sleep) 0%.  RESPIRATORY ANALYSIS:  There were a total of 101 respiratory events:  13 obstructive apneas, 0 central apneas and 5 mixed apneas with 83 hypopneas. The patient also had 2 respiratory event related arousals (RERAs). Snoring was noted.  The total APNEA/HYPOPNEA INDEX (AHI) was 61.5 /hour and the total RESPIRATORY DISTURBANCE INDEX was 62.7 /hour.  0 events occurred in REM sleep and 171 events in NREM. The REM AHI was 0, /hour versus a non-REM AHI of 61.5 /hour. The patient spent 162.5 minutes sleep time in the supine position 180 minutes in non-supine. The supine AHI was 64.3 /hour versus a non-supine AHI of 56.5 /hour.  OXYGEN SATURATION & C02:  The wake baseline 02 saturation was 98%, with the lowest being 86%. Time spent below 89% saturation equaled 15 minutes. PERIODIC LIMB MOVEMENTS:   The patient had a total of 0 Periodic Limb Movements.  The arousals were noted as: 19 were spontaneous, 0 were associated with PLMs, and 79 were associated with respiratory events. Audio and video analysis did not show  any abnormal or unusual movements, behaviors, phonations or vocalizations. The patient took one bathroom break. Snoring was noted. EKG was in keeping with normal sinus rhythm (NSR)  TITRATION STUDY WITH CPAP RESULTS:   CPAP was initiated at 5 cmH20 with heated humidity per AASM split night standards and pressure was advanced to 13 cmH20 because of hypopneas, apneas and desaturations.  At a PAP pressure of 13 cmH20, there was a reduction of the AHI to 0.0 /hour.  Total recording time (TRT) was 292.5 minutes, with a total sleep time (TST) of 244 minutes. The patient's sleep latency was 3 minutes. REM  latency was 166 minutes.  The sleep efficiency was 83.4 %.    SLEEP ARCHITECTURE: Wake after sleep was 45 minutes, Stage N1 15.5 minutes, Stage N2 27 minutes, Stage N3 115.5 minutes and Stage R (REM sleep) 86 minutes. The percentages were: Stage N1 6.4%, Stage N2 11.1%, Stage N3 47.3% and Stage R (REM sleep) 35.2%.  RESPIRATORY ANALYSIS:  There were a total of 13 respiratory events: 0 obstructive apneas, 1 central apnea and 2 mixed apneas with 10 hypopneas. The patient also had 2 respiratory event related arousals (RERAs).  The total APNEA/HYPOPNEA INDEX (AHI) was 3.2 /hour and the total RESPIRATORY DISTURBANCE INDEX was 3.7 /hour.  The patient spent 41% of total sleep time in the supine position. The supine AHI was 4.2 /hour, versus a non-supine AHI of 2.4/hour.  OXYGEN SATURATION & C02:  The wake baseline 02 saturation was 92%, with the lowest being 83%. Time spent below 89% saturation equaled 0.2 minutes.  AROUSALS:   The arousals were noted as: 34 were spontaneous, 0 were associated with PLMs, and 7 were associated with respiratory events. The patient had a total of 0 Periodic Limb Movements.  Post-study, the patient indicated that sleep was the same as usual.  POLYSOMNOGRAPHY IMPRESSION :   1. Still having Severe Obstructive Sleep Apnea(OSA) with an AHI of 61/h. This in spite of weight loss.  2. Snoring is noted, but no related arousals and no hypoxemia.  3. Restless sleep before CPAP was initiated.   RECOMMENDATIONS:  1. Advise to continue CPAP treatment and rest machine to 13 cm water to optimize therapy.  2. Refit for a different mask. She used here a dream wear FFM in small which seemed to fit very well.    Compliance to PAP therapy should be emphasized as 4 hours or more every night .  Compliance, AHI and air leak information to be downloaded for objective assessment at 30 days, 180 days and annually thereafter.   3. Further information regarding OSA may be obtained from [facility] (www.sleepfoundation.org) or American Sleep Apnea Association (www.sleepapnea.org). 4. Consider dedicated sleep psychology referral if insomnia is of clinical concern.   5. A follow up appointment will be scheduled in the Sleep Clinic at [facility].   I certify that I have reviewed the entire raw data recording prior to the issuance of this report in accordance with the Standards of Accreditation of the American Academy of Sleep Medicine (AASM)      Larey Seat, M.D.    02-17-2018  Diplomat, American Board of Psychiatry and Neurology  Diplomat, [place] of Sleep Medicine Medical Director, [place] [facility]

## 2018-02-18 ENCOUNTER — Telehealth: Payer: Self-pay | Admitting: Neurology

## 2018-02-18 NOTE — Telephone Encounter (Signed)
Called patient to discuss sleep study results. No answer at this time. LVM for the patient to call back.   

## 2018-02-18 NOTE — Telephone Encounter (Signed)
-----   Message from Larey Seat, MD sent at 02/17/2018  5:51 PM EDT ----- POLYSOMNOGRAPHY IMPRESSION :   1. Still having Severe Obstructive Sleep Apnea(OSA) with an AHI  of 61/h. This in spite of weight loss.  2. Snoring is noted, but no related arousals and no hypoxemia.  3. Restless sleep before CPAP was initiated.  RECOMMENDATIONS:  1. Advise to continue CPAP treatment and rest machine to 13 cm  water to optimize therapy.  2. Refit for a different mask. She used here a dream wear FFM in small which seemed to fit very well.   Cc Dr Suzanna Obey

## 2018-02-19 NOTE — Telephone Encounter (Signed)
Pt returned call. I advised pt that [name] reviewed their sleep study results and found that pt still has obstructive sleep apnea. [name] recommends that pt continues with CPAP. I reviewed PAP compliance expectations with the pt. Pt is agreeable to starting a CPAP. I advised pt that an order will be sent to a DME, [facility] , and [facility] will call the pt within about one week after they file with the pt's insurance. [facility] will show the pt how to use the machine, fit for masks, and troubleshoot the CPAP if needed. A follow up appt was made for insurance purposes with Cecille Rubin on 04/29/18 at 12:45 pm. Pt verbalized understanding to arrive 15 minutes early and bring their CPAP. A letter with all of this information in it will be mailed to the pt as a reminder. I verified with the pt that the address we have on file is correct. Pt verbalized understanding of results. Pt had no questions at this time but was encouraged to call back if questions arise.

## 2018-03-23 ENCOUNTER — Other Ambulatory Visit: Payer: Self-pay | Admitting: Family Medicine

## 2018-03-23 DIAGNOSIS — Z1231 Encounter for screening mammogram for malignant neoplasm of breast: Secondary | ICD-10-CM

## 2018-04-14 ENCOUNTER — Ambulatory Visit: Payer: Medicare Other

## 2018-04-14 ENCOUNTER — Ambulatory Visit
Admission: RE | Admit: 2018-04-14 | Discharge: 2018-04-14 | Disposition: A | Discharge status: Home / Self Care | Payer: Medicare Other | Source: Ambulatory Visit | Attending: Family Medicine | Admitting: Family Medicine

## 2018-04-14 DIAGNOSIS — Z1231 Encounter for screening mammogram for malignant neoplasm of breast: Secondary | ICD-10-CM

## 2018-04-27 ENCOUNTER — Encounter: Payer: Self-pay | Admitting: Nurse Practitioner

## 2018-04-28 NOTE — Progress Notes (Signed)
[facility]  PATIENT: Amanda [name] DOB: 09-Apr-1946   REASON FOR VISIT: Follow-up for obstructive sleep apnea here for  CPAP HISTORY FROM: Patient   HISTORY OF PRESENT ILLNESS:UPDATE 6/13/2019CM .  Amanda [name] , 72-year-old female returns for follow-up with obstructive sleep apnea here for initial CPAP compliance.  She says she is having problems with her mask however she has not been to [facility].  CPAP data dated 03/29/2018-04/27/2018 shows compliance with 3%. Usage days less than 4 hours's at 40%. Total usage 13 out of 30 at 43%.     Average usage 1 hour 50 minutes.  Set pressure 13 EPR level 2 leak 95th percentile 54.1.  AHI 2.5 ESS 19.  She returns for reevaluation    01/27/18 CDSuzanne [name] is a 72 y.o. female , today on 01-27-2018 seen in follow up. She has lost a lot of weight and looks well, reports she feels well. She proudly reports she now wears dress size 14 W for bottom and 16 for tops, down from 24. She is very pear shaped.  Amanda [name] is widowed, she has adult children.  Her daughter got divorced after more than 37 years of marriage and re-married recently.  In addition to her 2 grandchildren that live in [place] she also gained 2 step grandchildren. Her son also lives in [place] and has 2 additional children that are 59 and 29 years old.  She has kept active, she is well-groomed, she reports that her memory has been stable. 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   She is not excessively daytime sleepy and only endorsed the Epworth sleepiness score at 2 points, fatigue severity, geriatric depression score, She is here mainly for CPAP compliance which has been higher in the past, this time she had only 18 days out of 30 days with over 4 hours of consecutive use ( 60% compliance).  Average use of time is 4 hours 28 minutes, set pressure of 6 cmH2O was 2 cm EPR and her residual apnea is 3.8/h.  She does seem  to have higher than usual air leaks.  She has blisters on her nose form the nasal mask, and she feels she cannot make it tight enough. She has not been happy with nasal pillows in the past. I will first try another mask - may be the fit is not longer well because of weight loss, We can also order a repeat study to make sure she still has any OSA.      REVIEW OF SYSTEMS: Full 14 system review of systems performed and notable only for those listed, all others are neg:  Constitutional: neg  Cardiovascular: neg Ear/Nose/Throat: neg  Skin: neg Eyes: neg Respiratory: neg Gastroitestinal: neg  Hematology/Lymphatic: neg  Endocrine: neg Musculoskeletal:neg Allergy/Immunology: neg Neurological: neg Psychiatric: neg Sleep : Obstructive sleep apnea with CPAP   ALLERGIES: Allergies  Allergen Reactions  . Atorvastatin Other (See Comments)    Body aches  . Aciphex [Rabeprazole Sodium] Hives  . Erythromycin Nausea And Vomiting  . Macrodantin Nausea And Vomiting  . Nsaids     Bleeding in bladder  . Omeprazole-Sodium Bicarbonate [Omeprazole-Sodium Bicarbonate] Hives  . Sulfa Antibiotics Nausea Only and Other (See Comments)    Feel terrible  . Tape Rash    HOME MEDICATIONS: Outpatient Medications Prior to Visit  Medication Sig Dispense Refill  . albuterol (PROVENTIL HFA;VENTOLIN HFA) 108 (90 Base) MCG/ACT inhaler Inhale into  the lungs.    Marland Kitchen buPROPion (WELLBUTRIN XL) 300 MG 24 hr tablet Take 1 tablet (300 mg total) by mouth daily. 90 tablet 3  . cetirizine (ZYRTEC) 10 MG tablet Take by mouth.    . Cholecalciferol (VITAMIN D3) 1000 UNITS CAPS Take 1,000 Units by mouth daily.     . clonazePAM (KLONOPIN) 0.5 MG tablet Take 1 tablet (0.5 mg total) by mouth daily. (Patient taking differently: Take 0.5 mg by mouth 3 (three) times daily as needed. ) 30 tablet 0  . fluticasone (FLONASE) 50 MCG/ACT nasal spray USE 2 SPRAYS EACH NOSTRIL EVERY DAY    . hydrOXYzine (ATARAX/VISTARIL) 10 MG tablet Take  by mouth.    . levothyroxine (SYNTHROID, LEVOTHROID) 25 MCG tablet Take 25 mcg by mouth daily.      Marland Kitchen losartan (COZAAR) 25 MG tablet Take 25 mg by mouth daily.     . metFORMIN (GLUCOPHAGE) 500 MG tablet Take 1 tablet (500 mg total) by mouth 2 (two) times daily with a meal.    . metFORMIN (GLUCOPHAGE-XR) 500 MG 24 hr tablet     . metoprolol tartrate (LOPRESSOR) 25 MG tablet Take 25 mg by mouth 2 (two) times daily.    . montelukast (SINGULAIR) 10 MG tablet Take 10 mg by mouth daily.  1  . omeprazole (PRILOSEC) 40 MG capsule Take 40 mg by mouth daily.      Marland Kitchen oxybutynin (DITROPAN-XL) 10 MG 24 hr tablet Take 10 mg by mouth daily.  11  . pravastatin (PRAVACHOL) 40 MG tablet Take 40 mg by mouth daily.    . traMADol (ULTRAM) 50 MG tablet TAKE 1 TO 2 TABLETS BY MOUTH EVERY 4 TO 6 HOURS AS NEEDED FOR PAIN (MAX 8 TABS/DAY)  0  . traZODone (DESYREL) 100 MG tablet Take 100-200 mg by mouth at bedtime. Most of the time takes 100 mg    . venlafaxine (EFFEXOR) 50 MG tablet Take 2 tablets (100 mg total) by mouth daily. 90 tablet 3  . traMADol (ULTRAM) 50 MG tablet Take 1 tablet (50 mg total) by mouth every 8 (eight) hours as needed. 60 tablet 0  . acetaminophen-codeine (TYLENOL #3) 300-30 MG tablet Take 1 tablet by mouth every 8 (eight) hours as needed for moderate pain. (Patient not taking: Reported on 04/29/2018) 30 tablet 0  . HYDROcodone-acetaminophen (NORCO/VICODIN) 5-325 MG tablet Take 1 tablet by mouth every 12 (twelve) hours as needed for moderate pain. (Patient not taking: Reported on 04/29/2018) 30 tablet 0   No facility-administered medications prior to visit.     PAST MEDICAL HISTORY: Past Medical History:  Diagnosis Date  . Anemia due to blood loss   . Anxiety   . Arthritis   . Asthma    seasonal or with bronchitis  . Complication of anesthesia    "forgets to breathe" when she wakes up  . Depression   . Diabetes type 2, controlled ([place])    fasting 100-120  . GERD (gastroesophageal reflux  disease)   . Headache   . Heart murmur   . Hepatitis    A -- as teenager  . Hyperlipidemia   . Hypertension   . Hypothyroidism   . Mitral valve prolapse   . OSA on CPAP   . Pneumonia    hx of  . Vertigo     PAST SURGICAL HISTORY: Past Surgical History:  Procedure Laterality Date  . ABDOMINAL HYSTERECTOMY    . CHOLECYSTECTOMY    . FRACTURE SURGERY Right  leg  . GALLBLADDER SURGERY  1995  . JOINT REPLACEMENT Left    knee  . NASAL SINUS SURGERY  2005  . PARTIAL HYSTERECTOMY  1983  . THROAT SURGERY  2005  . 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 Cancer Mother        panceratic  . Cancer - Colon Mother   . Breast cancer Maternal Aunt     SOCIAL HISTORY: Social History   Socioeconomic History  . Marital status: Married    Spouse name: Not on file  . Number of children: Not on file  . Years of education: Not on file  . Highest education level: Not on file  Occupational History  . Occupation: retired    Comment: Secondary school teacher  Social Needs  . Financial resource strain: Not on file  . Food insecurity:    Worry: Not on file    Inability: Not on file  . Transportation needs:    Medical: Not on file    Non-medical: Not on file  Tobacco Use  . Smoking status: Never Smoker  . Smokeless tobacco: Never Used  Substance and Sexual Activity  . Alcohol use: No    Comment: socially  . Drug use: No  . Sexual activity: Not on file  Lifestyle  . Physical activity:    Days per week: Not on file    Minutes per session: Not on file  . Stress: Not on file  Relationships  . Social connections:    Talks on phone: Not on file    Gets together: Not on file    Attends religious service: Not on file    Active member of club or organization: Not on file    Attends  meetings of clubs or organizations: Not on file    Relationship status: Not on file  . Intimate partner violence:    Fear of current or ex partner: Not on file    Emotionally abused: Not on file    Physically abused: Not on file    Forced sexual activity: Not on file  Other Topics Concern  . Not on file  Social History Narrative  . Not on file     PHYSICAL EXAM  Vitals:   04/29/18 1249  BP: (!) 151/63  Pulse: (!) 57  Weight: 190 lb 6.4 oz (86.4 kg)  Height: 5\' 4"  (1.626 m)   Body mass index is 32.68 kg/m.  Generalized: Well developed, in no acute distress  Head: normocephalic and atraumatic,. Oropharynx benign mallopatti 3 Neck: Supple, circumference 15 Lungs: Clear Musculoskeletal: No deformity  Skin no edema  Neurological examination   Mentation: Alert oriented to time, place, history taking. Attention span and concentration appropriate. Recent and remote memory intact.  Follows all commands speech and language fluent.   Cranial nerve II-XII: Pupils were equal round reactive to light extraocular movements were full, visual field were full on confrontational test. Facial sensation and strength were normal. hearing was intact to finger rubbing bilaterally. Uvula tongue midline. head turning and shoulder shrug were normal and symmetric.Tongue protrusion into cheek strength was normal. Motor: normal bulk and tone, full strength in the BUE, BLE,  Sensory: normal and symmetric to light touch,   Coordination: finger-nose-finger, heel-to-shin bilaterally, no dysmetria Gait and Station: Rising up from seated position  without assistance, normal stance,  moderate stride, good arm swing, smooth turning, able to perform tiptoe, and heel walking without difficulty. Tandem gait is unsteady  DIAGNOSTIC DATA (LABS, IMAGING, TESTING) - I reviewed patient records, labs, notes, testing and imaging myself where available.  Lab Results  Component Value Date   WBC 9.6 10/15/2014   HGB 8.3  (L) 10/15/2014   HCT 26.3 (L) 10/15/2014   MCV 84.6 10/15/2014   PLT 392 10/15/2014      Component Value Date/Time   NA 133 (L) 10/15/2014 1924   K 3.5 (L) 10/15/2014 1924   CL 94 (L) 10/15/2014 1924   CO2 27 10/15/2014 1924   GLUCOSE 81 10/15/2014 1924   BUN 10 10/15/2014 1924   CREATININE 0.81 10/15/2014 1924   CALCIUM 9.3 10/15/2014 1924   PROT 6.4 10/11/2014 1313   ALBUMIN 3.1 (L) 10/11/2014 1313   AST 28 10/11/2014 1313   ALT 10 10/11/2014 1313   ALKPHOS 86 10/11/2014 1313   BILITOT 0.6 10/11/2014 1313   GFRNONAA 72 (L) 10/15/2014 1924   GFRAA 84 (L) 10/15/2014 1924    Lab Results  Component Value Date   HGBA1C 5.9 (H) 10/12/2014    ASSESSMENT AND PLAN  72 y.o. year old female  has a past medical history of OSA on CPAP, here for CPAP compliance.CPAP data dated 03/29/2018-04/27/2018 shows compliance with 3%.  Usage days less than 4 hours's at 40%. Total usage 13 out of 30 at 43%.  Average usage 1 hour 50 minutes.  Set pressure 13 EPR level 2 leak 95th percentile 54.1.  AHI 2.5 ESS 19.   CPAP compliance 3% greater than 4 hours Needs mask refit  Continue same settings  Follow up in 4 months Dennie Bible, [facility], [facility], APRN  [facility] [address] [place],  [address] [phone]

## 2018-04-29 ENCOUNTER — Encounter: Payer: Self-pay | Admitting: Nurse Practitioner

## 2018-04-29 ENCOUNTER — Ambulatory Visit: Payer: Medicare Other | Admitting: Nurse Practitioner

## 2018-04-29 VITALS — BP 151/63 | HR 57 | Ht 64.0 in | Wt 190.4 lb

## 2018-04-29 DIAGNOSIS — G4733 Obstructive sleep apnea (adult) (pediatric): Secondary | ICD-10-CM | POA: Diagnosis not present

## 2018-04-29 DIAGNOSIS — Z9989 Dependence on other enabling machines and devices: Secondary | ICD-10-CM | POA: Diagnosis not present

## 2018-04-29 NOTE — Patient Instructions (Signed)
CPAP compliance 3% greater than 4 hours Needs mask refit  Continue same settings  Follow up in 4 months

## 2018-05-03 NOTE — Progress Notes (Signed)
Fax confirmation received to [facility] [phone]. ([place] office).

## 2018-05-07 NOTE — Progress Notes (Signed)
I agree with the assessment and plan as directed by NP .The patient is known to me .   Gareld Obrecht, MD  

## 2018-05-07 NOTE — Progress Notes (Signed)
I agree with the assessment and plan as directed by NP .The patient is known to me .   Jeff Frieden, MD  

## 2018-08-25 ENCOUNTER — Encounter: Payer: Self-pay | Admitting: Nurse Practitioner

## 2018-08-30 NOTE — Progress Notes (Signed)
[facility]  PATIENT: Amanda [name] DOB: 06/05/1946   REASON FOR VISIT: Follow-up for obstructive sleep apnea here for  CPAP compliance HISTORY FROM: Patient   HISTORY OF PRESENT ILLNESS: Amanda [name], 72 year old female returns for follow-up with history of obstructive sleep apnea.  She is here for her second compliance visit she had poor compliance on her last visit.  Patient states that [facility] cannot get a mask that fits correctly.  According to Lovena Le at [facility]  she had a mask refit in August.  Compliance data dated 07/27/2018 to 08/25/2018 shows compliance greater than 4 hours at 20%.  Average usage 4 hours 10 minutes.  Set pressure 13 cm leak 95th percentile 47.4.  AHI 19.1 ESS 12 she returns for reevaluation UPDATE 6/13/2019CM .  Amanda [name] , 89-year-old female returns for follow-up with obstructive sleep apnea here for initial CPAP compliance.  She says she is having problems with her mask however she has not been to [facility].  CPAP data dated 03/29/2018-04/27/2018 shows compliance with 3%. Usage days less than 4 hours's at 40%. Total usage 13 out of 30 at 43%.     Average usage 1 hour 50 minutes.  Set pressure 13 EPR Amanda 2 leak 95th percentile 54.1.  AHI 2.5 ESS 19.  She returns for reevaluation    01/27/18 CDSuzanne Darnell Amanda [name] is a 72 y.o. female , today on 01-27-2018 seen in follow up. She has lost a lot of weight and looks well, reports she feels well. She proudly reports she now wears dress size 65 W for bottom and 16 for tops, down from 24. She is very pear shaped.  Amanda [name] is widowed, she has adult children.  Her daughter got divorced after more than 18 years of marriage and re-married recently.  In addition to her 2 grandchildren that live in [place] she also gained 2 step grandchildren. Her son also lives in [place] and has 2 additional children that are 18 and 38 years old.  She has kept active, she is well-groomed, she reports that her memory has been  stable. 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   She is not excessively daytime sleepy and only endorsed the Epworth sleepiness score at 2 points, fatigue severity, geriatric depression score, She is here mainly for CPAP compliance which has been higher in the past, this time she had only 18 days out of 30 days with over 4 hours of consecutive use ( 60% compliance).  Average use of time is 4 hours 28 minutes, set pressure of 6 cmH2O was 2 cm EPR and her residual apnea is 3.8/h.  She does seem to have higher than usual air leaks.  She has blisters on her nose form the nasal mask, and she feels she cannot make it tight enough. She has not been happy with nasal pillows in the past. I will first try another mask - may be the fit is not longer well because of weight loss, We can also order a repeat study to make sure she still has any OSA.      REVIEW OF SYSTEMS: Full 14 system review of systems performed and notable only for those listed, all others are neg:  Constitutional: neg  Cardiovascular: neg Ear/Nose/Throat: neg  Skin: neg Eyes: neg Respiratory: neg Gastroitestinal: neg  Hematology/Lymphatic: neg  Endocrine: neg Musculoskeletal:neg Allergy/Immunology: neg Neurological: neg Psychiatric: Anxiety Sleep : Obstructive sleep apnea with CPAP  ALLERGIES: Allergies  Allergen Reactions  . Atorvastatin Other (See Comments)    Body aches  . Aciphex [Rabeprazole Sodium] Hives  . Erythromycin Nausea And Vomiting  . Macrodantin Nausea And Vomiting  . Nsaids     Bleeding in bladder  . Omeprazole-Sodium Bicarbonate [Omeprazole-Sodium Bicarbonate] Hives  . Sulfa Antibiotics Nausea Only and Other (See Comments)    Feel terrible  . Tape Rash    HOME MEDICATIONS: Outpatient Medications Prior to Visit  Medication Sig Dispense Refill  . albuterol (PROVENTIL HFA;VENTOLIN HFA) 108 (90 Base) MCG/ACT inhaler Inhale into the lungs.    Marland Kitchen  buPROPion (WELLBUTRIN XL) 300 MG 24 hr tablet Take 1 tablet (300 mg total) by mouth daily. 90 tablet 3  . cetirizine (ZYRTEC) 10 MG tablet Take by mouth.    . Cholecalciferol (VITAMIN D3) 1000 UNITS CAPS Take 1,000 Units by mouth daily.     . clonazePAM (KLONOPIN) 0.5 MG tablet Take 1 tablet (0.5 mg total) by mouth daily. (Patient taking differently: Take 0.5 mg by mouth 3 (three) times daily as needed. ) 30 tablet 0  . fluticasone (FLONASE) 50 MCG/ACT nasal spray USE 2 SPRAYS EACH NOSTRIL EVERY DAY    . hydrOXYzine (ATARAX/VISTARIL) 10 MG tablet Take by mouth.    . levothyroxine (SYNTHROID, LEVOTHROID) 25 MCG tablet Take 25 mcg by mouth daily.      Marland Kitchen losartan (COZAAR) 25 MG tablet Take 25 mg by mouth daily.     . metFORMIN (GLUCOPHAGE) 500 MG tablet Take 1 tablet (500 mg total) by mouth 2 (two) times daily with a meal.    . metFORMIN (GLUCOPHAGE-XR) 500 MG 24 hr tablet     . metoprolol tartrate (LOPRESSOR) 25 MG tablet Take 25 mg by mouth 2 (two) times daily.    . montelukast (SINGULAIR) 10 MG tablet Take 10 mg by mouth daily.  1  . omeprazole (PRILOSEC) 40 MG capsule Take 40 mg by mouth daily.      Marland Kitchen oxybutynin (DITROPAN-XL) 10 MG 24 hr tablet Take 10 mg by mouth daily.  11  . pravastatin (PRAVACHOL) 40 MG tablet Take 40 mg by mouth daily.    . traMADol (ULTRAM) 50 MG tablet TAKE 1 TO 2 TABLETS BY MOUTH EVERY 4 TO 6 HOURS AS NEEDED FOR PAIN (MAX 8 TABS/DAY)  0  . traZODone (DESYREL) 100 MG tablet Take 100-200 mg by mouth at bedtime. Most of the time takes 100 mg    . venlafaxine (EFFEXOR) 50 MG tablet Take 2 tablets (100 mg total) by mouth daily. 90 tablet 3  . acetaminophen-codeine (TYLENOL #3) 300-30 MG tablet Take 1 tablet by mouth every 8 (eight) hours as needed for moderate pain. (Patient not taking: Reported on 04/29/2018) 30 tablet 0  . HYDROcodone-acetaminophen (NORCO/VICODIN) 5-325 MG tablet Take 1 tablet by mouth every 12 (twelve) hours as needed for moderate pain. (Patient not taking:  Reported on 04/29/2018) 30 tablet 0   No facility-administered medications prior to visit.     PAST MEDICAL HISTORY: Past Medical History:  Diagnosis Date  . Anemia due to blood loss   . Anxiety   . Arthritis   . Asthma    seasonal or with bronchitis  . Complication of anesthesia    "forgets to breathe" when she wakes up  . Depression   . Diabetes type 2, controlled ([place])    fasting 100-120  . GERD (gastroesophageal reflux disease)   . Headache   . Heart murmur   . Hepatitis  A -- as teenager  . Hyperlipidemia   . Hypertension   . Hypothyroidism   . Mitral valve prolapse   . OSA on CPAP   . Pneumonia    hx of  . Vertigo     PAST SURGICAL HISTORY: Past Surgical History:  Procedure Laterality Date  . ABDOMINAL HYSTERECTOMY    . CHOLECYSTECTOMY    . FRACTURE SURGERY Right    leg  . GALLBLADDER SURGERY  1995  . JOINT REPLACEMENT Left    knee  . NASAL SINUS SURGERY  2005  . PARTIAL HYSTERECTOMY  1983  . THROAT SURGERY  2005  . TOTAL KNEE ARTHROPLASTY Right 09/12/2014   Procedure: TOTAL KNEE ARTHROPLASTY;  Surgeon: Meredith Pel, MD;  Location: [facility];  Service: Orthopedics;  Laterality: Right;    FAMILY HISTORY: Family History  Problem Relation Age of Onset  . Cancer Mother        panceratic  . Cancer - Colon Mother   . Breast cancer Maternal Aunt     SOCIAL HISTORY: Social History   Socioeconomic History  . Marital status: Married    Spouse name: Not on file  . Number of children: Not on file  . Years of education: Not on file  . Highest education Amanda: Not on file  Occupational History  . Occupation: retired    Comment: Secondary school teacher  Social Needs  . Financial resource strain: Not on file  . Food insecurity:    Worry: Not on file    Inability: Not on file  . Transportation needs:    Medical: Not on file    Non-medical: Not on file  Tobacco Use  . Smoking status: Never Smoker  . Smokeless tobacco: Never Used  Substance and Sexual  Activity  . Alcohol use: No    Comment: socially  . Drug use: No  . Sexual activity: Not on file  Lifestyle  . Physical activity:    Days per week: Not on file    Minutes per session: Not on file  . Stress: Not on file  Relationships  . Social connections:    Talks on phone: Not on file    Gets together: Not on file    Attends religious service: Not on file    Active member of club or organization: Not on file    Attends meetings of clubs or organizations: Not on file    Relationship status: Not on file  . Intimate partner violence:    Fear of current or ex partner: Not on file    Emotionally abused: Not on file    Physically abused: Not on file    Forced sexual activity: Not on file  Other Topics Concern  . Not on file  Social History Narrative  . Not on file     PHYSICAL EXAM  Vitals:   09/01/18 1311  BP: 117/76  Pulse: 70  Weight: 183 lb 12.8 oz (83.4 kg)  Height: 5\' 4"  (1.626 m)   Body mass index is 31.55 kg/m.  Generalized: Well developed, in no acute distress  Head: normocephalic and atraumatic,. Oropharynx benign mallopatti 3 Neck: Supple, circumference 15 Lungs: Clear Musculoskeletal: No deformity  Skin no edema  Neurological examination   Mentation: Alert oriented to time, place, history taking. Attention span and concentration appropriate. Recent and remote memory intact.  Follows all commands speech and language fluent.   Cranial nerve II-XII: Pupils were equal round reactive to light extraocular movements were full, visual field were  full on confrontational test. Facial sensation and strength were normal. hearing was intact to finger rubbing bilaterally. Uvula tongue midline. head turning and shoulder shrug were normal and symmetric.Tongue protrusion into cheek strength was normal. Motor: normal bulk and tone, full strength in the BUE, BLE,  Sensory: normal and symmetric to light touch,   Coordination: finger-nose-finger, heel-to-shin bilaterally,  no dysmetria Gait and Station: Rising up from seated position without assistance, normal stance,  moderate stride, good arm swing, smooth turning, able to perform tiptoe, and heel walking without difficulty. Tandem gait is steady.  No assistive device  DIAGNOSTIC DATA (LABS, IMAGING, TESTING) - I reviewed patient records, labs, notes, testing and imaging myself where available.  Lab Results  Component Value Date   WBC 9.6 10/15/2014   HGB 8.3 (L) 10/15/2014   HCT 26.3 (L) 10/15/2014   MCV 84.6 10/15/2014   PLT 392 10/15/2014      Component Value Date/Time   NA 133 (L) 10/15/2014 1924   K 3.5 (L) 10/15/2014 1924   CL 94 (L) 10/15/2014 1924   CO2 27 10/15/2014 1924   GLUCOSE 81 10/15/2014 1924   BUN 10 10/15/2014 1924   CREATININE 0.81 10/15/2014 1924   CALCIUM 9.3 10/15/2014 1924   PROT 6.4 10/11/2014 1313   ALBUMIN 3.1 (L) 10/11/2014 1313   AST 28 10/11/2014 1313   ALT 10 10/11/2014 1313   ALKPHOS 86 10/11/2014 1313   BILITOT 0.6 10/11/2014 1313   GFRNONAA 72 (L) 10/15/2014 1924   GFRAA 84 (L) 10/15/2014 1924    Lab Results  Component Value Date   HGBA1C 5.9 (H) 10/12/2014    ASSESSMENT AND PLAN  72 y.o. year old female  has a past medical history of OSA on CPAP, here for CPAP compliance.CPAP data dated 07/27/2018 to 08/25/2018 shows compliance greater than 4 hours at 20%.  Average usage 4 hours 10 minutes.  Set pressure 13 cm leak 95th percentile 47.4.  AHI 19.1 ESS 12   CPAP compliance 20% greater than 4 hours Needs mask refit spoke to [place] at [facility] same settings  Follow up in 4 months Dennie Bible, [facility], [facility], [facility] [address] [place],  [address] [phone]

## 2018-09-01 ENCOUNTER — Encounter: Payer: Self-pay | Admitting: Nurse Practitioner

## 2018-09-01 ENCOUNTER — Ambulatory Visit: Payer: Medicare Other | Admitting: Nurse Practitioner

## 2018-09-01 VITALS — BP 117/76 | HR 70 | Ht 64.0 in | Wt 183.8 lb

## 2018-09-01 DIAGNOSIS — G4733 Obstructive sleep apnea (adult) (pediatric): Secondary | ICD-10-CM | POA: Diagnosis not present

## 2018-09-01 DIAGNOSIS — Z9114 Patient's other noncompliance with medication regimen: Secondary | ICD-10-CM

## 2018-09-01 DIAGNOSIS — Z9989 Dependence on other enabling machines and devices: Secondary | ICD-10-CM

## 2018-09-01 NOTE — Progress Notes (Signed)
Spoke with Sallee Lange with [facility] and she stated that Ms. Martha received a new mask in August 2019. [facility] hasn't received any new orders since then.   New order placed for mask refitting with [facility]. Community message sent to [employer] and [employer].

## 2018-09-01 NOTE — Patient Instructions (Signed)
CPAP compliance 20% greater than 4 hours Needs mask refit spoke to [place] at [facility] same settings  Follow up in 4 months

## 2018-09-27 ENCOUNTER — Observation Stay (HOSPITAL_COMMUNITY)
Admission: EM | Admit: 2018-09-27 | Discharge: 2018-09-28 | Disposition: A | Discharge status: Home / Self Care | Payer: Medicare Other | Attending: Family Medicine | Admitting: Family Medicine

## 2018-09-27 ENCOUNTER — Encounter (HOSPITAL_COMMUNITY): Payer: Self-pay

## 2018-09-27 ENCOUNTER — Other Ambulatory Visit: Payer: Self-pay

## 2018-09-27 ENCOUNTER — Observation Stay (HOSPITAL_BASED_OUTPATIENT_CLINIC_OR_DEPARTMENT_OTHER): Payer: Medicare Other

## 2018-09-27 DIAGNOSIS — E039 Hypothyroidism, unspecified: Secondary | ICD-10-CM | POA: Insufficient documentation

## 2018-09-27 DIAGNOSIS — K219 Gastro-esophageal reflux disease without esophagitis: Secondary | ICD-10-CM | POA: Insufficient documentation

## 2018-09-27 DIAGNOSIS — N183 Chronic kidney disease, stage 3 (moderate): Secondary | ICD-10-CM | POA: Insufficient documentation

## 2018-09-27 DIAGNOSIS — W06XXXA Fall from bed, initial encounter: Secondary | ICD-10-CM | POA: Diagnosis not present

## 2018-09-27 DIAGNOSIS — R296 Repeated falls: Secondary | ICD-10-CM | POA: Diagnosis not present

## 2018-09-27 DIAGNOSIS — Z86718 Personal history of other venous thrombosis and embolism: Secondary | ICD-10-CM | POA: Insufficient documentation

## 2018-09-27 DIAGNOSIS — I341 Nonrheumatic mitral (valve) prolapse: Secondary | ICD-10-CM | POA: Insufficient documentation

## 2018-09-27 DIAGNOSIS — R531 Weakness: Secondary | ICD-10-CM | POA: Diagnosis present

## 2018-09-27 DIAGNOSIS — E1122 Type 2 diabetes mellitus with diabetic chronic kidney disease: Secondary | ICD-10-CM | POA: Diagnosis not present

## 2018-09-27 DIAGNOSIS — J45909 Unspecified asthma, uncomplicated: Secondary | ICD-10-CM | POA: Insufficient documentation

## 2018-09-27 DIAGNOSIS — Z882 Allergy status to sulfonamides status: Secondary | ICD-10-CM | POA: Insufficient documentation

## 2018-09-27 DIAGNOSIS — Z9181 History of falling: Secondary | ICD-10-CM | POA: Diagnosis not present

## 2018-09-27 DIAGNOSIS — W19XXXA Unspecified fall, initial encounter: Secondary | ICD-10-CM

## 2018-09-27 DIAGNOSIS — Z79899 Other long term (current) drug therapy: Secondary | ICD-10-CM | POA: Insufficient documentation

## 2018-09-27 DIAGNOSIS — D649 Anemia, unspecified: Secondary | ICD-10-CM | POA: Insufficient documentation

## 2018-09-27 DIAGNOSIS — Z7984 Long term (current) use of oral hypoglycemic drugs: Secondary | ICD-10-CM | POA: Diagnosis not present

## 2018-09-27 DIAGNOSIS — I129 Hypertensive chronic kidney disease with stage 1 through stage 4 chronic kidney disease, or unspecified chronic kidney disease: Secondary | ICD-10-CM | POA: Insufficient documentation

## 2018-09-27 DIAGNOSIS — Z7951 Long term (current) use of inhaled steroids: Secondary | ICD-10-CM | POA: Diagnosis not present

## 2018-09-27 DIAGNOSIS — Z79890 Hormone replacement therapy (postmenopausal): Secondary | ICD-10-CM | POA: Diagnosis not present

## 2018-09-27 DIAGNOSIS — F329 Major depressive disorder, single episode, unspecified: Secondary | ICD-10-CM | POA: Insufficient documentation

## 2018-09-27 DIAGNOSIS — I4891 Unspecified atrial fibrillation: Secondary | ICD-10-CM | POA: Diagnosis not present

## 2018-09-27 DIAGNOSIS — I340 Nonrheumatic mitral (valve) insufficiency: Secondary | ICD-10-CM

## 2018-09-27 DIAGNOSIS — I10 Essential (primary) hypertension: Secondary | ICD-10-CM

## 2018-09-27 DIAGNOSIS — E785 Hyperlipidemia, unspecified: Secondary | ICD-10-CM | POA: Diagnosis not present

## 2018-09-27 DIAGNOSIS — Z888 Allergy status to other drugs, medicaments and biological substances status: Secondary | ICD-10-CM | POA: Insufficient documentation

## 2018-09-27 DIAGNOSIS — M1990 Unspecified osteoarthritis, unspecified site: Secondary | ICD-10-CM | POA: Insufficient documentation

## 2018-09-27 DIAGNOSIS — Z886 Allergy status to analgesic agent status: Secondary | ICD-10-CM | POA: Insufficient documentation

## 2018-09-27 DIAGNOSIS — Z8701 Personal history of pneumonia (recurrent): Secondary | ICD-10-CM | POA: Diagnosis not present

## 2018-09-27 DIAGNOSIS — I272 Pulmonary hypertension, unspecified: Secondary | ICD-10-CM | POA: Diagnosis not present

## 2018-09-27 DIAGNOSIS — G809 Cerebral palsy, unspecified: Secondary | ICD-10-CM | POA: Diagnosis not present

## 2018-09-27 DIAGNOSIS — I480 Paroxysmal atrial fibrillation: Secondary | ICD-10-CM | POA: Diagnosis not present

## 2018-09-27 DIAGNOSIS — E119 Type 2 diabetes mellitus without complications: Secondary | ICD-10-CM

## 2018-09-27 DIAGNOSIS — F419 Anxiety disorder, unspecified: Secondary | ICD-10-CM | POA: Insufficient documentation

## 2018-09-27 DIAGNOSIS — G4733 Obstructive sleep apnea (adult) (pediatric): Secondary | ICD-10-CM | POA: Insufficient documentation

## 2018-09-27 DIAGNOSIS — Z66 Do not resuscitate: Secondary | ICD-10-CM | POA: Insufficient documentation

## 2018-09-27 DIAGNOSIS — Z881 Allergy status to other antibiotic agents status: Secondary | ICD-10-CM | POA: Insufficient documentation

## 2018-09-27 LAB — URINALYSIS, ROUTINE W REFLEX MICROSCOPIC
Bilirubin Urine: NEGATIVE
GLUCOSE, UA: NEGATIVE mg/dL
HGB URINE DIPSTICK: NEGATIVE
KETONES UR: 5 mg/dL — AB
NITRITE: NEGATIVE
Protein, ur: NEGATIVE mg/dL
Specific Gravity, Urine: 1.015 (ref 1.005–1.030)
pH: 5 (ref 5.0–8.0)

## 2018-09-27 LAB — ECHOCARDIOGRAM COMPLETE
HEIGHTINCHES: 64 in
Weight: 2880 oz

## 2018-09-27 LAB — PROTIME-INR
INR: 1.13
Prothrombin Time: 14.4 seconds (ref 11.4–15.2)

## 2018-09-27 LAB — APTT: aPTT: 33 seconds (ref 24–36)

## 2018-09-27 LAB — TROPONIN I
Troponin I: <0.03 ng/mL (ref <0.03)
Troponin I: <0.03 ng/mL (ref <0.03)
Troponin I: <0.03 ng/mL (ref <0.03)

## 2018-09-27 LAB — GLUCOSE, CAPILLARY
GLUCOSE-CAPILLARY: 131 mg/dL — AB (ref 70–99)
GLUCOSE-CAPILLARY: 143 mg/dL — AB (ref 70–99)
Glucose-Capillary: 64 mg/dL — ABNORMAL LOW (ref 70–99)
Glucose-Capillary: 94 mg/dL (ref 70–99)

## 2018-09-27 LAB — COMPREHENSIVE METABOLIC PANEL
ALK PHOS: 52 U/L (ref 38–126)
ALT: 12 U/L (ref 0–44)
ANION GAP: 10 (ref 5–15)
AST: 18 U/L (ref 15–41)
Albumin: 3.6 g/dL (ref 3.5–5.0)
BUN: 24 mg/dL — ABNORMAL HIGH (ref 8–23)
CALCIUM: 9 mg/dL (ref 8.9–10.3)
CO2: 22 mmol/L (ref 22–32)
Chloride: 106 mmol/L (ref 98–111)
Creatinine, Ser: 1.28 mg/dL — ABNORMAL HIGH (ref 0.44–1.00)
GFR calc non Af Amer: 40 mL/min — ABNORMAL LOW (ref >60)
GFR, EST AFRICAN AMERICAN: 47 mL/min — AB (ref >60)
Glucose, Bld: 100 mg/dL — ABNORMAL HIGH (ref 70–99)
Potassium: 4.2 mmol/L (ref 3.5–5.1)
SODIUM: 138 mmol/L (ref 135–145)
Total Bilirubin: 1.2 mg/dL (ref 0.3–1.2)
Total Protein: 6.2 g/dL — ABNORMAL LOW (ref 6.5–8.1)

## 2018-09-27 LAB — DIFFERENTIAL
Abs Immature Granulocytes: 0.04 10*3/uL (ref 0.00–0.07)
BASOS PCT: 1 %
Basophils Absolute: 0.1 10*3/uL (ref 0.0–0.1)
EOS ABS: 0.1 10*3/uL (ref 0.0–0.5)
EOS PCT: 1 %
Immature Granulocytes: 0 %
Lymphocytes Relative: 18 %
Lymphs Abs: 2 10*3/uL (ref 0.7–4.0)
MONO ABS: 0.8 10*3/uL (ref 0.1–1.0)
MONOS PCT: 8 %
Neutro Abs: 8 10*3/uL — ABNORMAL HIGH (ref 1.7–7.7)
Neutrophils Relative %: 72 %

## 2018-09-27 LAB — CBC
HCT: 35.9 % — ABNORMAL LOW (ref 36.0–46.0)
Hemoglobin: 11.4 g/dL — ABNORMAL LOW (ref 12.0–15.0)
MCH: 28.4 pg (ref 26.0–34.0)
MCHC: 31.8 g/dL (ref 30.0–36.0)
MCV: 89.5 fL (ref 80.0–100.0)
PLATELETS: 295 10*3/uL (ref 150–400)
RBC: 4.01 MIL/uL (ref 3.87–5.11)
RDW: 13 % (ref 11.5–15.5)
WBC: 11 10*3/uL — AB (ref 4.0–10.5)
nRBC: 0 % (ref 0.0–0.2)

## 2018-09-27 LAB — I-STAT TROPONIN, ED: Troponin i, poc: 0.02 ng/mL (ref 0.00–0.08)

## 2018-09-27 LAB — TSH: TSH: 5.227 u[IU]/mL — ABNORMAL HIGH (ref 0.350–4.500)

## 2018-09-27 LAB — T4, FREE: Free T4: 1.45 ng/dL (ref 0.82–1.77)

## 2018-09-27 MED ORDER — PANTOPRAZOLE SODIUM 40 MG PO TBEC
40.0000 mg | DELAYED_RELEASE_TABLET | Freq: Every day | ORAL | Status: DC
  Administered 2018-09-27 – 2018-09-28 (×2): 40 mg via ORAL
  Filled 2018-09-27 (×2): qty 1

## 2018-09-27 MED ORDER — METOPROLOL TARTRATE 25 MG PO TABS: 25.0000 mg | ORAL_TABLET | Freq: Two times a day (BID) | ORAL | Status: DC

## 2018-09-27 MED ORDER — METOPROLOL TARTRATE 25 MG PO TABS
25.0000 mg | ORAL_TABLET | Freq: Once | ORAL | Status: AC
  Administered 2018-09-27: 25 mg via ORAL
  Filled 2018-09-27: qty 1

## 2018-09-27 MED ORDER — MONTELUKAST SODIUM 10 MG PO TABS
10.0000 mg | ORAL_TABLET | Freq: Every day | ORAL | Status: DC
  Administered 2018-09-27: 10 mg via ORAL
  Filled 2018-09-27 (×2): qty 1

## 2018-09-27 MED ORDER — HEPARIN BOLUS VIA INFUSION
3000.0000 [IU] | Freq: Once | INTRAVENOUS | Status: AC
  Administered 2018-09-27: 3000 [IU] via INTRAVENOUS
  Filled 2018-09-27: qty 3000

## 2018-09-27 MED ORDER — OXYBUTYNIN CHLORIDE ER 5 MG PO TB24
10.0000 mg | ORAL_TABLET | Freq: Every day | ORAL | Status: DC
  Administered 2018-09-27 – 2018-09-28 (×2): 10 mg via ORAL
  Filled 2018-09-27 (×2): qty 2

## 2018-09-27 MED ORDER — LOSARTAN POTASSIUM 25 MG PO TABS
25.0000 mg | ORAL_TABLET | Freq: Every day | ORAL | Status: DC
  Administered 2018-09-27 – 2018-09-28 (×2): 25 mg via ORAL
  Filled 2018-09-27 (×2): qty 1

## 2018-09-27 MED ORDER — INSULIN ASPART 100 UNIT/ML ~~LOC~~ SOLN
0.0000 [IU] | Freq: Three times a day (TID) | SUBCUTANEOUS | Status: DC
  Administered 2018-09-27: 1 [IU] via SUBCUTANEOUS

## 2018-09-27 MED ORDER — HEPARIN (PORCINE) 25000 UT/250ML-% IV SOLN
1000.0000 [IU]/h | INTRAVENOUS | Status: DC
  Administered 2018-09-27: 1000 [IU]/h via INTRAVENOUS
  Filled 2018-09-27: qty 250

## 2018-09-27 MED ORDER — APIXABAN 5 MG PO TABS
5.0000 mg | ORAL_TABLET | Freq: Two times a day (BID) | ORAL | Status: DC
  Administered 2018-09-27 – 2018-09-28 (×3): 5 mg via ORAL
  Filled 2018-09-27 (×3): qty 1

## 2018-09-27 MED ORDER — METOPROLOL TARTRATE 50 MG PO TABS
50.0000 mg | ORAL_TABLET | Freq: Two times a day (BID) | ORAL | Status: DC
  Administered 2018-09-27: 50 mg via ORAL
  Filled 2018-09-27: qty 1

## 2018-09-27 MED ORDER — CLONAZEPAM 0.5 MG PO TABS
0.5000 mg | ORAL_TABLET | Freq: Two times a day (BID) | ORAL | Status: DC
  Administered 2018-09-27 – 2018-09-28 (×3): 0.5 mg via ORAL
  Filled 2018-09-27 (×3): qty 1

## 2018-09-27 MED ORDER — BUPROPION HCL ER (XL) 300 MG PO TB24
300.0000 mg | ORAL_TABLET | Freq: Every day | ORAL | Status: DC
  Administered 2018-09-27 – 2018-09-28 (×2): 300 mg via ORAL
  Filled 2018-09-27 (×2): qty 1

## 2018-09-27 MED ORDER — TRAZODONE HCL 100 MG PO TABS
100.0000 mg | ORAL_TABLET | Freq: Every day | ORAL | Status: DC
  Administered 2018-09-27: 100 mg via ORAL
  Filled 2018-09-27: qty 1

## 2018-09-27 MED ORDER — PRAVASTATIN SODIUM 40 MG PO TABS
40.0000 mg | ORAL_TABLET | Freq: Every day | ORAL | Status: DC
  Administered 2018-09-27 – 2018-09-28 (×2): 40 mg via ORAL
  Filled 2018-09-27 (×2): qty 1

## 2018-09-27 MED ORDER — VENLAFAXINE HCL 50 MG PO TABS
100.0000 mg | ORAL_TABLET | Freq: Every day | ORAL | Status: DC
  Administered 2018-09-27 – 2018-09-28 (×2): 100 mg via ORAL
  Filled 2018-09-27 (×2): qty 2

## 2018-09-27 MED ORDER — PERFLUTREN LIPID MICROSPHERE
1.0000 mL | INTRAVENOUS | Status: AC | PRN
  Administered 2018-09-27: 2 mL via INTRAVENOUS
  Filled 2018-09-27: qty 10

## 2018-09-27 MED ORDER — ACETAMINOPHEN 650 MG RE SUPP: 650.0000 mg | Freq: Four times a day (QID) | RECTAL | Status: DC | PRN

## 2018-09-27 MED ORDER — ACETAMINOPHEN 325 MG PO TABS: 650.0000 mg | ORAL_TABLET | Freq: Four times a day (QID) | ORAL | Status: DC | PRN

## 2018-09-27 MED ORDER — LEVOTHYROXINE SODIUM 25 MCG PO TABS
25.0000 ug | ORAL_TABLET | Freq: Every day | ORAL | Status: DC
  Administered 2018-09-27 – 2018-09-28 (×2): 25 ug via ORAL
  Filled 2018-09-27 (×2): qty 1

## 2018-09-27 MED ORDER — METOPROLOL TARTRATE 25 MG PO TABS
37.5000 mg | ORAL_TABLET | Freq: Two times a day (BID) | ORAL | Status: DC
  Administered 2018-09-27: 37.5 mg via ORAL
  Filled 2018-09-27: qty 2

## 2018-09-27 NOTE — ED Provider Notes (Signed)
[facility] DEPT Provider Note   CSN: [id number] Arrival date & time: 09/27/18  0041     History   Chief Complaint Chief Complaint  Patient presents with  . Fall    HPI Amanda [name] is a 72 y.o. female.  HPI   Presents with generalized weakness Developed over the last 2-3 days Today was in bed, reached over to get drink of water and then slid slowly off bed losing balance. Did not lose consciousness, no head trauma or headche. Did not have strength in arms to pull up and crawled on floor to phone to call EMS No focal weakness or numbness, no change in speech No chest pain or shortness of breath No fevers or chills No n/v/d No cough or dysuria No black or bloody stool No palpitations  Eating and drinking ok, sometimes not feeling like cooking  On medication from Urology, thinks it is making her dehydrated Feeling dehydrated Ants bit her last Friday Red fire ants came out of the mailbox, was bit by a lot of them one week ago Friday  Known MVP, has not seen Cardiology, no known hx of afib  Not on blood thinners, lives alone No changes in medications   Past Medical History:  Diagnosis Date  . Anemia due to blood loss   . Anxiety   . Arthritis   . Asthma    seasonal or with bronchitis  . Complication of anesthesia    "forgets to breathe" when she wakes up  . Depression   . Diabetes type 2, controlled ([place])    fasting 100-120  . GERD (gastroesophageal reflux disease)   . Headache   . Heart murmur   . Hepatitis    A -- as teenager  . Hyperlipidemia   . Hypertension   . Hypothyroidism   . Mitral valve prolapse   . OSA on CPAP   . Pneumonia    hx of  . Vertigo     Patient Active Problem List   Diagnosis Date Noted  . Atrial fibrillation with RVR ([place]) 09/27/2018  . Weakness generalized 09/27/2018  . Poor compliance with CPAP treatment 01/27/2018  . Trochanteric bursitis of right hip 12/01/2017  . 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 Acute bronchitis due to Streptococcus 11/22/2015  . OSA on CPAP 11/22/2015  . Depression (emotion) 11/22/2015  . Abnormal CT scan   . Hematoma of left psoas region to anticoagulant therapy 10/11/2014  . Coagulopathy ([place]) 10/11/2014  . Psoas hematoma, left, secondary to anticoagulant therapy 10/11/2014  . Acute hyponatremia 10/11/2014  . S/P total knee arthroplasty 09/24/2014  . Hypertension   . GERD (gastroesophageal reflux disease)   . Diabetes type 2, controlled ([place])   . Depression   . Hyperlipidemia   . 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 Cough 08/31/2013    Past Surgical History:  Procedure Laterality Date  . ABDOMINAL HYSTERECTOMY    . CHOLECYSTECTOMY    . FRACTURE SURGERY Right    leg  . GALLBLADDER SURGERY  1995  . JOINT REPLACEMENT Left    knee  . NASAL SINUS SURGERY  2005  . PARTIAL HYSTERECTOMY  1983  . THROAT SURGERY  2005  . TOTAL KNEE ARTHROPLASTY Right 09/12/2014   Procedure: TOTAL KNEE ARTHROPLASTY;  Surgeon: Meredith Pel, MD;  Location: [facility];  Service: Orthopedics;  Laterality: Right;     OB History   None      Home Medications    Prior to Admission medications  Medication Sig Start Date End Date Taking? Authorizing Provider  acetaminophen (TYLENOL) 500 MG tablet Take 500-1,000 mg by mouth every 6 (six) hours as needed for moderate pain.   Yes [provider]  buPROPion (WELLBUTRIN XL) 300 MG 24 hr tablet Take 1 tablet (300 mg total) by mouth daily. 01/22/17  Yes Dohmeier, Asencion Partridge, MD  Cholecalciferol (VITAMIN D3) 1000 UNITS CAPS Take 1,000 Units by mouth daily.    Yes [provider]  clonazePAM (KLONOPIN) 0.5 MG tablet Take 1 tablet (0.5 mg total) by mouth daily. Patient taking differently: Take 0.5 mg by mouth 2 (two) times daily.  01/22/17  Yes Dohmeier, Asencion Partridge, MD  cycloSPORINE (RESTASIS) 0.05 % ophthalmic emulsion Place 1 drop into both eyes 2 (two) times daily.   Yes [provider]  fluticasone  (FLONASE) 50 MCG/ACT nasal spray Place 1 spray into both nostrils daily as needed for allergies.  08/20/17  Yes [provider]  levothyroxine (SYNTHROID, LEVOTHROID) 25 MCG tablet Take 25 mcg by mouth daily.     Yes [provider]  losartan (COZAAR) 25 MG tablet Take 25 mg by mouth daily.    Yes [provider]  metFORMIN (GLUCOPHAGE-XR) 500 MG 24 hr tablet Take 500 mg by mouth 2 (two) times daily.  01/20/18  Yes [provider]  metoprolol tartrate (LOPRESSOR) 25 MG tablet Take 25 mg by mouth 2 (two) times daily.   Yes [provider]  montelukast (SINGULAIR) 10 MG tablet Take 10 mg by mouth daily. 12/28/17  Yes [provider]  omeprazole (PRILOSEC) 40 MG capsule Take 40 mg by mouth daily.     Yes [provider]  oxybutynin (DITROPAN-XL) 10 MG 24 hr tablet Take 10 mg by mouth daily. 06/17/17  Yes [provider]  pravastatin (PRAVACHOL) 40 MG tablet Take 40 mg by mouth daily.   Yes [provider]  traZODone (DESYREL) 100 MG tablet Take 100 mg by mouth at bedtime.    Yes [provider]  venlafaxine (EFFEXOR) 50 MG tablet Take 2 tablets (100 mg total) by mouth daily. 11/22/15  Yes Dohmeier, Asencion Partridge, MD  acetaminophen-codeine (TYLENOL #3) 300-30 MG tablet Take 1 tablet by mouth every 8 (eight) hours as needed for moderate pain. Patient not taking: Reported on 04/29/2018 08/03/17   Meredith Pel, MD  HYDROcodone-acetaminophen (NORCO/VICODIN) 5-325 MG tablet Take 1 tablet by mouth every 12 (twelve) hours as needed for moderate pain. Patient not taking: Reported on 04/29/2018 07/29/17   Meredith Pel, MD  metFORMIN (GLUCOPHAGE) 500 MG tablet Take 1 tablet (500 mg total) by mouth 2 (two) times daily with a meal. Patient not taking: Reported on 09/27/2018 10/14/14   Thurnell Lose, MD    Family History Family History  Problem Relation Age of Onset  . Cancer Mother        panceratic  . Cancer - Colon  Mother   . Breast cancer Maternal Aunt     Social History Social History   Tobacco Use  . Smoking status: Never Smoker  . Smokeless tobacco: Never Used  Substance Use Topics  . Alcohol use: No    Comment: socially  . Drug use: No     Allergies   Atorvastatin; Aciphex [rabeprazole sodium]; Coumadin [warfarin sodium]; Erythromycin; Macrodantin; Nsaids; Omeprazole-sodium bicarbonate [omeprazole-sodium bicarbonate]; Sulfa antibiotics; and Tape   Review of Systems Review of Systems  Constitutional: Positive for fatigue. Negative for appetite change and fever.  HENT: Negative for sore throat.  Eyes: Negative for visual disturbance.  Respiratory: Negative for cough and shortness of breath.   Cardiovascular: Negative for chest pain.  Gastrointestinal: Negative for abdominal pain, diarrhea, nausea and vomiting.  Genitourinary: Negative for difficulty urinating.  Musculoskeletal: Negative for back pain and neck pain.  Skin: Negative for rash.  Neurological: Negative for syncope and headaches.     Physical Exam Updated Vital Signs BP (!) 134/94 (BP Location: Left Arm)   Pulse (!) 122 Comment: with ambulation  Temp 98.4 F (36.9 C) (Oral)   Resp 18   Ht 5\' 4"  (1.626 m)   Wt 81.6 kg   SpO2 95%   BMI 30.90 kg/m   Physical Exam  Constitutional: She is oriented to person, place, and time. She appears well-developed and well-nourished. No distress.  HENT:  Head: Normocephalic and atraumatic.  Eyes: Conjunctivae and EOM are normal.  Neck: Normal range of motion.  Cardiovascular: Normal heart sounds and intact distal pulses. An irregularly irregular rhythm present. Tachycardia present. Exam reveals no gallop and no friction rub.  No murmur heard. Pulmonary/Chest: Effort normal and breath sounds normal. No respiratory distress. She has no wheezes. She has no rales.  Abdominal: Soft. She exhibits no distension. There is no tenderness. There is no guarding.  Musculoskeletal: She  exhibits no edema or tenderness.  Neurological: She is alert and oriented to person, place, and time.  Skin: Skin is warm and dry. No rash noted. She is not diaphoretic. No erythema.  Nursing note and vitals reviewed.    ED Treatments / Results  Labs (all labs ordered are listed, but only abnormal results are displayed) Labs Reviewed  COMPREHENSIVE METABOLIC PANEL - Abnormal; Notable for the following components:      Result Value   Glucose, Bld 100 (*)    BUN 24 (*)    Creatinine, Ser 1.28 (*)    Total Protein 6.2 (*)    GFR calc non Af Amer 40 (*)    GFR calc Af Amer 47 (*)    All other components within normal limits  URINALYSIS, ROUTINE W REFLEX MICROSCOPIC - Abnormal; Notable for the following components:   Ketones, ur 5 (*)    Leukocytes, UA SMALL (*)    Bacteria, UA RARE (*)    All other components within normal limits  CBC - Abnormal; Notable for the following components:   WBC 11.0 (*)    Hemoglobin 11.4 (*)    HCT 35.9 (*)    All other components within normal limits  DIFFERENTIAL - Abnormal; Notable for the following components:   Neutro Abs 8.0 (*)    All other components within normal limits  TSH - Abnormal; Notable for the following components:   TSH 5.227 (*)    All other components within normal limits  GLUCOSE, CAPILLARY - Abnormal; Notable for the following components:   Glucose-Capillary 64 (*)    All other components within normal limits  GLUCOSE, CAPILLARY - Abnormal; Notable for the following components:   Glucose-Capillary 131 (*)    All other components within normal limits  T4, FREE  PROTIME-INR  APTT  TROPONIN I  TROPONIN I  CBC WITH DIFFERENTIAL/PLATELET  T3, FREE  TROPONIN I  I-STAT TROPONIN, ED    EKG EKG Interpretation  Date/Time:  Monday September 27 2018 00:57:04 EST Ventricular Rate:  109 PR Interval:    QRS Duration: 96 QT Interval:  329 QTC Calculation: 443 R Axis:   94 Text Interpretation:  Atrial fibrillation Right  axis deviation  Borderline repolarization abnormality Baseline wander in lead(s) V2 Since prior ECG, pt now appears to be in atrial fibrillation Nonspecific ST changes Confirmed by Gareth Morgan [phone]) on 09/27/2018 3:19:27 AM   Radiology No results found.  Procedures Procedures (including critical care time)  Medications Ordered in ED Medications  losartan (COZAAR) tablet 25 mg (25 mg Oral Given 09/27/18 1100)  pravastatin (PRAVACHOL) tablet 40 mg (40 mg Oral Given 09/27/18 1100)  buPROPion (WELLBUTRIN XL) 24 hr tablet 300 mg (300 mg Oral Given 09/27/18 1100)  traZODone (DESYREL) tablet 100 mg (has no administration in time range)  venlafaxine (EFFEXOR) tablet 100 mg (100 mg Oral Given 09/27/18 1100)  levothyroxine (SYNTHROID, LEVOTHROID) tablet 25 mcg (25 mcg Oral Given 09/27/18 0656)  pantoprazole (PROTONIX) EC tablet 40 mg (40 mg Oral Given 09/27/18 1100)  oxybutynin (DITROPAN-XL) 24 hr tablet 10 mg (10 mg Oral Given 09/27/18 1100)  clonazePAM (KLONOPIN) tablet 0.5 mg (0.5 mg Oral Given 09/27/18 1100)  montelukast (SINGULAIR) tablet 10 mg (10 mg Oral Given 09/27/18 1100)  acetaminophen (TYLENOL) tablet 650 mg (has no administration in time range)    Or  acetaminophen (TYLENOL) suppository 650 mg (has no administration in time range)  insulin aspart (novoLOG) injection 0-9 Units (1 Units Subcutaneous Given 09/27/18 1212)  perflutren lipid microspheres (DEFINITY) IV suspension (2 mLs Intravenous Given 09/27/18 0852)  apixaban (ELIQUIS) tablet 5 mg (5 mg Oral Given 09/27/18 1155)  metoprolol tartrate (LOPRESSOR) tablet 50 mg (has no administration in time range)  heparin bolus via infusion 3,000 Units (3,000 Units Intravenous Bolus from Bag 09/27/18 0456)  metoprolol tartrate (LOPRESSOR) tablet 25 mg (25 mg Oral Given 09/27/18 0448)     Initial Impression / Assessment and Plan / ED Course  I have reviewed the triage vital signs and the nursing notes.  Pertinent labs & imaging  results that were available during my care of the patient were reviewed by me and considered in my medical decision making (see chart for details).     72 year old female with a history of hypertension, diabetes, hyperlipidemia, hypothyroidism, mitral valve prolapse, presents with concern for generalized weakness and falls.  Regarding the falls, patient denies any traumatic injuries, head injury, loss of consciousness, or headache.  Denies focal neurologic concerns.  Reports she had falls, due to significant generalized weakness is unable to get up.  Labs obtained in the emergency department to evaluate her generalized weakness, show no sign of significant anemia, electrolyte problem, or urinary tract infection.  She does not have symptoms to suggest pneumonia.  She denies chest pain, shortness of breath, low suspicion for pulmonary embolus or ACS.  Troponin is within normal limits.  EKG and telemetry monitoring both show atrial fibrillation.  Patient with a rate between 100-120s.  Is unclear how long she has been in this heart rhythm.  It is possible that the atrial fibrillation is what contributed to her generalized weakness and falls.  Her CHADSVasc score is 4. Discussed with her we would recommend anticoagulation. She denies bleeding issues in the past but reports she was once on a blood thinner after surgery which was not regulated properly and does not want to be on something that could be misregulated.  Will admit for new onset of atrial fibrillation with generalized weakness, falls.  Given metoprolol po.   Final Clinical Impressions(s) / ED Diagnoses   Final diagnoses:  Fall, initial encounter  Generalized weakness  Atrial fibrillation, unspecified type [facility])    ED Discharge Orders    None  Gareth Morgan, MD 09/27/18 1304

## 2018-09-27 NOTE — Consult Note (Addendum)
Cardiology Consultation:   Patient ID: Amanda [name] MRN: [id number]; DOB: [birth year]  Admit date: 09/27/2018 Date of Consult: 09/27/2018  Primary Care Provider: Aretta Nip, MD Primary Cardiologist: No primary care provider on file.  Primary Electrophysiologist:  None    Patient Profile:   Amanda [name] is a 72 y.o. female with a hx of hypertension, hyperlipidemia, hypothyroidism, GERD, type 2 diabetes, asthma, OSA on CPAP who is being seen today for the evaluation of new onset atrial fibrillation at the request of Dr. Loleta Books.  History of Present Illness:   Amanda [name] has a medical history as above.  She presented to the hospital after a fall and complaints of leg weakness.  She has been having leg weakness and frequent falls over the last few months.  She did have a fall about 2 weeks ago at which time she did hit her head but did not lose consciousness.  Upon my exam the patient tells me that she has never had a heart attack or significant cardiac issue.  She says that she has known mitral valve prolapse and has felt irregular heartbeats off and on over her life.  She has never been told that she has A. Fib.  She developed significant generalized weakness, all over, several weeks ago.  It has been so severe that has caused multiple falls.  She says that prior to some of her falls she may have felt lightheaded but for most of them her legs just gave out under her.  She notes that she has lost approximately 100 pounds over the prior 4 years.  At first this was due to IBSD.  This resolved and she continued her weight loss with portion control.  She has had no chest pain, pressure or discomfort.  She had one episode of feeling like she could not get a deep breath about a week ago but this resolved with rest and she has had no other shortness of breath.  She has had no edema or orthopnea.  She reports that she had bleeding issues while on Coumadin after a knee replacement.  She is  not sure where the bleeding was but she denies that it was in her GI tract or head.  Currently she is feeling "much better "than when she came into the hospital.  She has not been up walking yet, but so far she feels far less weak.  Past Medical History:  Diagnosis Date  . Anemia due to blood loss   . Anxiety   . Arthritis   . Asthma    seasonal or with bronchitis  . Complication of anesthesia    "forgets to breathe" when she wakes up  . Depression   . Diabetes type 2, controlled ([facility])    fasting 100-120  . GERD (gastroesophageal reflux disease)   . Headache   . Heart murmur   . Hepatitis    A -- as teenager  . Hyperlipidemia   . Hypertension   . Hypothyroidism   . Mitral valve prolapse   . OSA on CPAP   . Pneumonia    hx of  . Vertigo     Past Surgical History:  Procedure Laterality Date  . ABDOMINAL HYSTERECTOMY    . CHOLECYSTECTOMY    . FRACTURE SURGERY Right    leg  . GALLBLADDER SURGERY  1995  . JOINT REPLACEMENT Left    knee  . NASAL SINUS SURGERY  2005  . PARTIAL HYSTERECTOMY  1983  . THROAT SURGERY  2005  . TOTAL KNEE ARTHROPLASTY Right 09/12/2014   Procedure: TOTAL KNEE ARTHROPLASTY;  Surgeon: Meredith Pel, MD;  Location: [facility];  Service: Orthopedics;  Laterality: Right;     Home Medications:  Prior to Admission medications   Medication Sig Start Date End Date Taking? Authorizing Provider  acetaminophen (TYLENOL) 500 MG tablet Take 500-1,000 mg by mouth every 6 (six) hours as needed for moderate pain.   Yes [provider]  buPROPion (WELLBUTRIN XL) 300 MG 24 hr tablet Take 1 tablet (300 mg total) by mouth daily. 01/22/17  Yes Dohmeier, Asencion Partridge, MD  Cholecalciferol (VITAMIN D3) 1000 UNITS CAPS Take 1,000 Units by mouth daily.    Yes [provider]  clonazePAM (KLONOPIN) 0.5 MG tablet Take 1 tablet (0.5 mg total) by mouth daily. Patient taking differently: Take 0.5 mg by mouth 2 (two) times daily.  01/22/17  Yes Dohmeier, Asencion Partridge, MD    cycloSPORINE (RESTASIS) 0.05 % ophthalmic emulsion Place 1 drop into both eyes 2 (two) times daily.   Yes [provider]  fluticasone (FLONASE) 50 MCG/ACT nasal spray Place 1 spray into both nostrils daily as needed for allergies.  08/20/17  Yes [provider]  levothyroxine (SYNTHROID, LEVOTHROID) 25 MCG tablet Take 25 mcg by mouth daily.     Yes [provider]  losartan (COZAAR) 25 MG tablet Take 25 mg by mouth daily.    Yes [provider]  metFORMIN (GLUCOPHAGE-XR) 500 MG 24 hr tablet Take 500 mg by mouth 2 (two) times daily.  01/20/18  Yes [provider]  metoprolol tartrate (LOPRESSOR) 25 MG tablet Take 25 mg by mouth 2 (two) times daily.   Yes [provider]  montelukast (SINGULAIR) 10 MG tablet Take 10 mg by mouth daily. 12/28/17  Yes [provider]  omeprazole (PRILOSEC) 40 MG capsule Take 40 mg by mouth daily.     Yes [provider]  oxybutynin (DITROPAN-XL) 10 MG 24 hr tablet Take 10 mg by mouth daily. 06/17/17  Yes [provider]  pravastatin (PRAVACHOL) 40 MG tablet Take 40 mg by mouth daily.   Yes [provider]  traZODone (DESYREL) 100 MG tablet Take 100 mg by mouth at bedtime.    Yes [provider]  venlafaxine (EFFEXOR) 50 MG tablet Take 2 tablets (100 mg total) by mouth daily. 11/22/15  Yes Dohmeier, Asencion Partridge, MD  acetaminophen-codeine (TYLENOL #3) 300-30 MG tablet Take 1 tablet by mouth every 8 (eight) hours as needed for moderate pain. Patient not taking: Reported on 04/29/2018 08/03/17   Meredith Pel, MD  HYDROcodone-acetaminophen (NORCO/VICODIN) 5-325 MG tablet Take 1 tablet by mouth every 12 (twelve) hours as needed for moderate pain. Patient not taking: Reported on 04/29/2018 07/29/17   Meredith Pel, MD  metFORMIN (GLUCOPHAGE) 500 MG tablet Take 1 tablet (500 mg total) by mouth 2 (two) times daily with a meal. Patient not taking: Reported on 09/27/2018 10/14/14    Thurnell Lose, MD    Inpatient Medications: Scheduled Meds: . buPROPion  300 mg Oral Daily  . clonazePAM  0.5 mg Oral BID  . insulin aspart  0-9 Units Subcutaneous TID WC  . levothyroxine  25 mcg Oral Q0600  . losartan  25 mg Oral Daily  . metoprolol tartrate  25 mg Oral BID  . montelukast  10 mg Oral Daily  . oxybutynin  10 mg Oral Daily  . pantoprazole  40 mg Oral Daily  . pravastatin  40 mg Oral Daily  .  traZODone  100 mg Oral QHS  . venlafaxine  100 mg Oral Daily   Continuous Infusions: . heparin 1,000 Units/hr (09/27/18 0454)   PRN Meds: acetaminophen **OR** acetaminophen  Allergies:    Allergies  Allergen Reactions  . Atorvastatin Other (See Comments)    Body aches  . Aciphex [Rabeprazole Sodium] Hives  . Coumadin [Warfarin Sodium] Other (See Comments)    Bleeding   . Erythromycin Nausea And Vomiting  . Macrodantin Nausea And Vomiting  . Nsaids     Bleeding in bladder  . Omeprazole-Sodium Bicarbonate [Omeprazole-Sodium Bicarbonate] Hives  . Sulfa Antibiotics Nausea Only and Other (See Comments)    Feel terrible  . Tape Rash    Social History:   Social History   Socioeconomic History  . Marital status: Married    Spouse name: Not on file  . Number of children: Not on file  . Years of education: Not on file  . Highest education level: Not on file  Occupational History  . Occupation: retired    Comment: Secondary school teacher  Social Needs  . Financial resource strain: Not on file  . Food insecurity:    Worry: Not on file    Inability: Not on file  . Transportation needs:    Medical: Not on file    Non-medical: Not on file  Tobacco Use  . Smoking status: Never Smoker  . Smokeless tobacco: Never Used  Substance and Sexual Activity  . Alcohol use: No    Comment: socially  . Drug use: No  . Sexual activity: Not on file  Lifestyle  . Physical activity:    Days per week: Not on file    Minutes per session: Not on file  . Stress: Not on file    Relationships  . Social connections:    Talks on phone: Not on file    Gets together: Not on file    Attends religious service: Not on file    Active member of club or organization: Not on file    Attends meetings of clubs or organizations: Not on file    Relationship status: Not on file  . Intimate partner violence:    Fear of current or ex partner: Not on file    Emotionally abused: Not on file    Physically abused: Not on file    Forced sexual activity: Not on file  Other Topics Concern  . Not on file  Social History Narrative  . Not on file    Family History:    Family History  Problem Relation Age of Onset  . Cancer Mother        panceratic  . Cancer - Colon Mother   . Breast cancer Maternal Aunt      ROS:  Please see the history of present illness.   All other ROS reviewed and negative.     Physical Exam/Data:   Vitals:   09/27/18 0056 09/27/18 0300 09/27/18 0400 09/27/18 0534  BP:  (!) 126/94 (!) 126/115 (!) 134/94  Pulse:  (!) 116 97 98  Resp:  (!) 22 (!) 25 18  Temp:    98.4 F (36.9 C)  TempSrc:    Oral  SpO2:  90% 93% 95%  Weight: 81.6 kg     Height: 5\' 4"  (1.626 m)      No intake or output data in the 24 hours ending 09/27/18 0738 Filed Weights   09/27/18 0056  Weight: 81.6 kg   Body mass index is 30.9  kg/m.  General:  Well nourished, well developed, in no acute distress HEENT: normal Lymph: no adenopathy Neck: no JVD Endocrine:  No thryomegaly Vascular: No carotid bruits; FA pulses 2+ bilaterally without bruits  Cardiac:  normal S1, S2; irregularly irregular rhythm; no murmur  Lungs:  clear to auscultation bilaterally, no wheezing, rhonchi or rales  Abd: soft, nontender, no hepatomegaly  Ext: no edema Musculoskeletal:  No deformities, BUE and BLE strength normal and equal Skin: warm and dry  Neuro:  CNs 2-12 intact, no focal abnormalities noted Psych:  Normal affect   EKG:  The EKG was personally reviewed and demonstrates:  Atrial  fibrillation, 109 bpm, RAD, Borderline repolarization abnormality, Non-specific ST cahgnes Telemetry:  Telemetry was personally reviewed and demonstrates: Atrial fibrillation 100-110 bpm  Relevant CV Studies:  Echo pending  Laboratory Data:  Chemistry Recent Labs  Lab 09/27/18 0210  NA 138  K 4.2  CL 106  CO2 22  GLUCOSE 100*  BUN 24*  CREATININE 1.28*  CALCIUM 9.0  GFRNONAA 40*  GFRAA 47*  ANIONGAP 10    Recent Labs  Lab 09/27/18 0210  PROT 6.2*  ALBUMIN 3.6  AST 18  ALT 12  ALKPHOS 52  BILITOT 1.2   Hematology Recent Labs  Lab 09/27/18 0234  WBC 11.0*  RBC 4.01  HGB 11.4*  HCT 35.9*  MCV 89.5  MCH 28.4  MCHC 31.8  RDW 13.0  PLT 295   Cardiac Enzymes Recent Labs  Lab 09/27/18 0636  TROPONINI <0.03    Recent Labs  Lab 09/27/18 0216  TROPIPOC 0.02    BNPNo results for input(s): BNP, PROBNP in the last 168 hours.  DDimer No results for input(s): DDIMER in the last 168 hours.  Radiology/Studies:  No results found.  Assessment and Plan:   Atrial fibrillation -Unclear onset. Patient presented with a fall and weakness and found to be in atrial fibrillation around 110 bpm.  She has had weakness for several weeks. -She continues in A. fib 100-110 bpm, but is feeling much better since admission. -Troponins are negative. -Echocardiogram has been ordered to evaluate LV function and valves. -CHA2DS2/VAS Stroke Risk Score is 4 (HTN, Age, DM, female).  He is currently on a heparin drip for stroke risk reduction.  Recommend transition to DOAC, Eliquis 5 mg BID. The patient does have a recent history of frequent falls, however, this may be related to her being in atrial fibrillation with potentially rapid rates.  I think it is worth attempting a trial of anticoagulation -We will increase her beta-blocker to attempt better rate control.  Could use diltiazem if echocardiogram does not show LV dysfunction. -She appears to be tolerating the A. fib better  currently, however, observe with activity.  It seems that she has not been tolerating A. fib over the prior few weeks, possibly causing her significant weakness.  If we cannot get better rate control and/or she does not tolerate the A. fib, consider TEE guided DCCV.  Otherwise if improved rate control is better tolerated could consider anticoagulation for 3 to 4 weeks then DCCV if still in A. Fib and attempt to restore sinus rhythm.  Hypertension -Home medications include losartan 25 mg daily, metoprolol tartrate 25 mg twice daily. -Diastolic blood pressure is elevated today -We will increase metoprolol for better rate control and watch blood pressure  Hyperlipidemia -On pravastatin 40 mg daily -Last lipid panel in care everywhere, 07/16/2016, TC 178, LDL 104  Hypothyroidism -TSH is mildly elevated at 5.227.  Free  T4 is normal  For questions or updates, please contact [place] Please consult [email or web address] for contact info under   Signed, Amanda Perch, NP  09/27/2018 7:38 AM  The patient was seen, examined and discussed with Amanda Perch, NP-C and I agree with the above.   72 year old female with prior medical history of hypertension, hyperlipidemia, hypothyroidism who was admitted after multiple falls and significant weight loss and was found to be in atrial fibrillation with RVR.  Her echocardiogram performed today shows normal LVEF 55 to 60%, since the patient is in atrial fibrillation we do not have information about diastolic function.  There is moderate mitral regurgitation with mild posterior leaflet prolapse, left atrium is moderately dilated, right ventricle systolic function is mildly reduced patient's right-sided pressures are mildly elevated with RVSP of 38 mmHg. The patient was started on metoprolol with some improvement of ventricular rates from 140s to 100-1 10, she feels better, she was also started on Eliquis.  TSH is minimally elevated however free T4 is normal. On  physical exam she is a very pleasant female sitting comfortably in bed, ready for physical therapy, she is irregular heartbeats minimal systolic murmur, clear lungs, great peripheral pulses and no peripheral edema. I would continue Eliquis, and increase metoprolol to 50 mg p.o. twice daily.  The plan would be 4 weeks of anticoagulation, outpatient follow-up and if she does not cardiovert on her own and bring her for an outpatient cardioversion after 4 weeks of full anticoagulation. With regards to her frequent falls, I personally do not believe that her A. 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  A. fib with RVR might have contributed to lower blood pressure as well.  Ena Dawley, MD 09/27/2018

## 2018-09-27 NOTE — ED Notes (Signed)
ED TO INPATIENT HANDOFF REPORT  Name/Age/Gender Amanda [name] 72 y.o. female  Code Status Code Status History    Date Active Date Inactive Code Status Order ID Comments User Context   10/11/2014 1804 10/13/2014 1611 DNR [id number]  Debbe Odea, MD ED   09/12/2014 1206 09/15/2014 1801 Full Code [id number]  Marlou Sa, Tonna Corner, MD Inpatient    Questions for Most Recent Historical Code Status (Order [id number])    Question Answer Comment   In the event of cardiac or respiratory ARREST Do not call a "code blue"    In the event of cardiac or respiratory ARREST Do not perform Intubation, CPR, defibrillation or ACLS    In the event of cardiac or respiratory ARREST Use medication by any route, position, wound care, and other measures to relive pain and suffering. May use oxygen, suction and manual treatment of airway obstruction as needed for comfort.         Advance Directive Documentation     Most Recent Value  Type of Advance Directive  Healthcare Power of Attorney  Pre-existing out of facility DNR order (yellow form or pink MOST form)  -  "MOST" Form in Place?  -      Home/SNF/Other Home  Chief Complaint Fall  Level of Care/Admitting Diagnosis ED Disposition    ED Disposition Condition [place]: [facility] [100102]  Level of Care: Telemetry [5]  Admit to tele based on following criteria: Complex arrhythmia (Bradycardia/Tachycardia)  Diagnosis: Atrial fibrillation with RVR [facility]) [301601]  Admitting Physician: Rise Patience [phone]  Attending Physician: Rise Patience [email or web address]  PT Class (Do Not Modify): Observation [104]  PT Acc Code (Do Not Modify): Observation [10022]       Medical History Past Medical History:  Diagnosis Date  . Anemia due to blood loss   . Anxiety   . Arthritis   . Asthma    seasonal or with bronchitis  . Complication of anesthesia    "forgets to breathe" when she wakes up  . Depression    . Diabetes type 2, controlled ([place])    fasting 100-120  . GERD (gastroesophageal reflux disease)   . Headache   . Heart murmur   . Hepatitis    A -- as teenager  . Hyperlipidemia   . Hypertension   . Hypothyroidism   . Mitral valve prolapse   . OSA on CPAP   . Pneumonia    hx of  . Vertigo     Allergies Allergies  Allergen Reactions  . Atorvastatin Other (See Comments)    Body aches  . Aciphex [Rabeprazole Sodium] Hives  . Coumadin [Warfarin Sodium] Other (See Comments)    Bleeding   . Erythromycin Nausea And Vomiting  . Macrodantin Nausea And Vomiting  . Nsaids     Bleeding in bladder  . Omeprazole-Sodium Bicarbonate [Omeprazole-Sodium Bicarbonate] Hives  . Sulfa Antibiotics Nausea Only and Other (See Comments)    Feel terrible  . Tape Rash    IV Location/Drains/Wounds Patient Lines/Drains/Airways Status   Active Line/Drains/Airways    Name:   Placement date:   Placement time:   Site:   Days:   Peripheral IV 09/27/18 Right Antecubital   09/27/18    0213    Antecubital   less than 1   Incision (Closed) 09/12/14 Knee Right   09/12/14    1001     1476          Labs/Imaging Results  for orders placed or performed during the hospital encounter of 09/27/18 (from the past 48 hour(s))  Urinalysis, Routine w reflex microscopic     Status: Abnormal   Collection Time: 09/27/18  1:42 AM  Result Value Ref Range   Color, Urine YELLOW YELLOW   APPearance CLEAR CLEAR   Specific Gravity, Urine 1.015 1.005 - 1.030   pH 5.0 5.0 - 8.0   Glucose, UA NEGATIVE NEGATIVE mg/dL   Hgb urine dipstick NEGATIVE NEGATIVE   Bilirubin Urine NEGATIVE NEGATIVE   Ketones, ur 5 (A) NEGATIVE mg/dL   Protein, ur NEGATIVE NEGATIVE mg/dL   Nitrite NEGATIVE NEGATIVE   Leukocytes, UA SMALL (A) NEGATIVE   RBC / HPF 0-5 0 - 5 RBC/hpf   WBC, UA 0-5 0 - 5 WBC/hpf   Bacteria, UA RARE (A) NONE SEEN   Squamous Epithelial / LPF 0-5 0 - 5   Mucus PRESENT    Hyaline Casts, UA PRESENT     Comment:  Performed at [facility], [place] [address] [place], [place] [address]  Comprehensive metabolic panel     Status: Abnormal   Collection Time: 09/27/18  2:10 AM  Result Value Ref Range   Sodium 138 135 - 145 mmol/L   Potassium 4.2 3.5 - 5.1 mmol/L   Chloride 106 98 - 111 mmol/L   CO2 22 22 - 32 mmol/L   Glucose, Bld 100 (H) 70 - 99 mg/dL   BUN 24 (H) 8 - 23 mg/dL   Creatinine, Ser 1.28 (H) 0.44 - 1.00 mg/dL   Calcium 9.0 8.9 - 10.3 mg/dL   Total Protein 6.2 (L) 6.5 - 8.1 g/dL   Albumin 3.6 3.5 - 5.0 g/dL   AST 18 15 - 41 U/L   ALT 12 0 - 44 U/L   Alkaline Phosphatase 52 38 - 126 U/L   Total Bilirubin 1.2 0.3 - 1.2 mg/dL   GFR calc non Af Amer 40 (L) >60 mL/min   GFR calc Af Amer 47 (L) >60 mL/min    Comment: (NOTE) The eGFR has been calculated using the CKD EPI equation. This calculation has not been validated in all clinical situations. eGFR's persistently <60 mL/min signify possible Chronic Kidney Disease.    Anion gap 10 5 - 15    Comment: Performed at [facility], [place] [address], [place], [place] [address]  I-Stat Troponin, ED (not at [facility])     Status: None   Collection Time: 09/27/18  2:16 AM  Result Value Ref Range   Troponin i, poc 0.02 0.00 - 0.08 ng/mL   Comment 3            Comment: Due to the release kinetics of cTnI, a negative result within the first hours of the onset of symptoms does not rule out myocardial infarction with certainty. If myocardial infarction is still suspected, repeat the test at appropriate intervals.   CBC     Status: Abnormal   Collection Time: 09/27/18  2:34 AM  Result Value Ref Range   WBC 11.0 (H) 4.0 - 10.5 K/uL   RBC 4.01 3.87 - 5.11 MIL/uL   Hemoglobin 11.4 (L) 12.0 - 15.0 g/dL   HCT 35.9 (L) 36.0 - 46.0 %   MCV 89.5 80.0 - 100.0 fL   MCH 28.4 26.0 - 34.0 pg   MCHC 31.8 30.0 - 36.0 g/dL   RDW 13.0 11.5 - 15.5 %   Platelets 295 150 - 400 K/uL   nRBC 0.0 0.0 -  0.2 %    Comment: Performed at  [facility], [place] [address] [place], [place] [address]  Differential     Status: Abnormal   Collection Time: 09/27/18  2:34 AM  Result Value Ref Range   Neutrophils Relative % 72 %   Neutro Abs 8.0 (H) 1.7 - 7.7 K/uL   Lymphocytes Relative 18 %   Lymphs Abs 2.0 0.7 - 4.0 K/uL   Monocytes Relative 8 %   Monocytes Absolute 0.8 0.1 - 1.0 K/uL   Eosinophils Relative 1 %   Eosinophils Absolute 0.1 0.0 - 0.5 K/uL   Basophils Relative 1 %   Basophils Absolute 0.1 0.0 - 0.1 K/uL   Immature Granulocytes 0 %   Abs Immature Granulocytes 0.04 0.00 - 0.07 K/uL    Comment: Performed at [facility], [place] [address], [place], [place] [address]   No results found.  Pending Labs Unresulted Labs (From admission, onward)    Start     Ordered   09/27/18 1300  Heparin level (unfractionated)  Once-Timed,   STAT     09/27/18 0342   09/27/18 0341  Protime-INR  Once-Timed,   R     09/27/18 0341   09/27/18 0341  APTT  Once,   R     09/27/18 0341   09/27/18 0332  TSH  STAT - ONCE,   STAT     09/27/18 0331   09/27/18 0332  T4, free  STAT - ONCE,   STAT     09/27/18 0331   09/27/18 0332  T3, free  STAT - ONCE,   STAT     09/27/18 0331   09/27/18 0142  CBC with Differential  STAT - ONCE,   STAT     09/27/18 0141          Vitals/Pain Today's Vitals   09/27/18 0051 09/27/18 0053 09/27/18 0056 09/27/18 0300  BP:  130/66  (!) 126/94  Pulse:  91  (!) 116  Resp:  16  (!) 22  Temp:  97.9 F (36.6 C)    TempSrc:  Oral    SpO2: 96% 92%  90%  Weight:   81.6 kg   Height:   _0  (1.626 m)   PainSc:   0-No pain     Isolation Precautions No active isolations  Medications Medications  heparin bolus via infusion 3,000 Units (has no administration in time range)  heparin ADULT infusion 100 units/mL (25000 units/268m sodium chloride 0.45%) (has no administration in time range)  metoprolol tartrate (LOPRESSOR) tablet 25 mg (has no administration in time range)     Mobility walks with device

## 2018-09-27 NOTE — ED Triage Notes (Addendum)
Per EMS, patient coming from home with two falls tonight. She has been experiencing increased weakness for a while now. She fell in the bathroom and used her life alert to call EMS. She refused transport. The second time she was leaning over in bed and slid onto the floor but could not get up so she used her life alert.  Patient did not hit her head or LOC and there are no obvious injuries. Patient is not on blood thinners.  Neighbor reports that patient has had increased confusion at nights recently.   According to life alert, patient has a history of Hep A

## 2018-09-27 NOTE — ED Notes (Signed)
Bed: WA21 Expected date:  Expected time:  Means of arrival:  Comments: 72 yo F/weakness-falls

## 2018-09-27 NOTE — Discharge Instructions (Signed)

## 2018-09-27 NOTE — Progress Notes (Signed)
PROGRESS NOTE    Amanda [name]  ZOX:[id number] DOB: [birth year] DOA: 09/27/2018 PCP: Aretta Nip, MD      Brief Narrative:  Amanda [name] is a 72 y.o. F with HTN, DM, hypothyroidism who presents with several weeks progressive dyspnea.  Found to have Afib with RVR.     Assessment & Plan:  New onset atrial fibrillation with RVR Chads 2 Vascor 4. - Convert heparin to Eliquis - Follow echocardiogram - Continue metoprolol -Consult cardiology  Falls and weakness The patient reports global weakness, never any focal weakness, -Check CK -Defer MRI pain  Hypertension -Continue metoprolol, losartan  Diabetes -Hold metformin -Continue SSI  Hypothyroidism -Continue levothyroxine  Depression -Continue venlafaxine, bupropion, Klonopin  Other medications -Continue montelukast, oxybutynin, pantoprazole, trazodone  CKD 3 Baseline creatinine 1.2, stable relative to baseline  Mild anemia Unclear cause -Follow-up as an outpatient       DVT prophylaxis: Not applicable Code Status: DO NOT RESUSCITATE Family Communication: None present MDM and disposition Plan: This is a no charge note.  For further details, please see H&P by my partner Dr. Hal Hope from earlier today.  The below labs and imaging reports were reviewed and summarized above.    The patient was admitted with new osnet Afib.    Objective: Vitals:   09/27/18 0400 09/27/18 0534 09/27/18 1231 09/27/18 1419  BP: (!) 126/115 (!) 134/94  123/77  Pulse: 97 98 (!) 122 97  Resp: (!) 25 18  (!) 24  Temp:  98.4 F (36.9 C)    TempSrc:  Oral    SpO2: 93% 95%  96%  Weight:      Height:       No intake or output data in the 24 hours ending 09/27/18 1748 Filed Weights   09/27/18 0056  Weight: 81.6 kg    Examination: The patient was seen and examined.      Data Reviewed: I have personally reviewed following labs and imaging studies:  CBC: Recent Labs  Lab 09/27/18 0234  WBC 11.0*    NEUTROABS 8.0*  HGB 11.4*  HCT 35.9*  MCV 89.5  PLT 811   Basic Metabolic Panel: Recent Labs  Lab 09/27/18 0210  NA 138  K 4.2  CL 106  CO2 22  GLUCOSE 100*  BUN 24*  CREATININE 1.28*  CALCIUM 9.0   GFR: Estimated Creatinine Clearance: 40.5 mL/min (A) (by C-G formula based on SCr of 1.28 mg/dL (H)). Liver Function Tests: Recent Labs  Lab 09/27/18 0210  AST 18  ALT 12  ALKPHOS 52  BILITOT 1.2  PROT 6.2*  ALBUMIN 3.6   No results for input(s): LIPASE, AMYLASE in the last 168 hours. No results for input(s): AMMONIA in the last 168 hours. Coagulation Profile: Recent Labs  Lab 09/27/18 0345  INR 1.13   Cardiac Enzymes: Recent Labs  Lab 09/27/18 0636 09/27/18 1129  TROPONINI <0.03 <0.03   BNP (last 3 results) No results for input(s): PROBNP in the last 8760 hours. HbA1C: No results for input(s): HGBA1C in the last 72 hours. CBG: Recent Labs  Lab 09/27/18 0812 09/27/18 1124 09/27/18 1633  GLUCAP 64* 131* 94   Lipid Profile: No results for input(s): CHOL, HDL, LDLCALC, TRIG, CHOLHDL, LDLDIRECT in the last 72 hours. Thyroid Function Tests: Recent Labs    09/27/18 0345  TSH 5.227*  FREET4 1.45   Anemia Panel: No results for input(s): VITAMINB12, FOLATE, FERRITIN, TIBC, IRON, RETICCTPCT in the last 72 hours. Urine analysis:    Component Value  Date/Time   COLORURINE YELLOW 09/27/2018 0142   APPEARANCEUR CLEAR 09/27/2018 0142   LABSPEC 1.015 09/27/2018 0142   PHURINE 5.0 09/27/2018 0142   GLUCOSEU NEGATIVE 09/27/2018 0142   HGBUR NEGATIVE 09/27/2018 0142   BILIRUBINUR NEGATIVE 09/27/2018 0142   KETONESUR 5 (A) 09/27/2018 0142   PROTEINUR NEGATIVE 09/27/2018 0142   UROBILINOGEN 0.2 10/15/2014 2020   NITRITE NEGATIVE 09/27/2018 0142   LEUKOCYTESUR SMALL (A) 09/27/2018 0142   Sepsis Labs: @LABRCNTIP (procalcitonin:4,lacticacidven:4)  )No results found for this or any previous visit (from the past 240 hour(s)).       Radiology  Studies: No results found.      Scheduled Meds: . apixaban  5 mg Oral BID  . buPROPion  300 mg Oral Daily  . clonazePAM  0.5 mg Oral BID  . insulin aspart  0-9 Units Subcutaneous TID WC  . levothyroxine  25 mcg Oral Q0600  . losartan  25 mg Oral Daily  . metoprolol tartrate  50 mg Oral BID  . montelukast  10 mg Oral Daily  . oxybutynin  10 mg Oral Daily  . pantoprazole  40 mg Oral Daily  . pravastatin  40 mg Oral Daily  . traZODone  100 mg Oral QHS  . venlafaxine  100 mg Oral Daily   Continuous Infusions:   LOS: 0 days    Time spent: 25 minutes    Edwin Dada, MD [facility] 09/27/2018, 5:48 PM     Pager [phone] --- please page though AMION:  [email or web address] Password [email or web address] If 7PM-7AM, please contact night-coverage

## 2018-09-27 NOTE — Progress Notes (Signed)
  Echocardiogram 2D Echocardiogram has been performed.  Jennette Dubin 09/27/2018, 9:03 AM

## 2018-09-27 NOTE — H&P (Addendum)
History and Physical    Amanda [name] VEL:[id number] DOB: 1946/08/18 DOA: 09/27/2018  PCP: Aretta Nip, MD  Patient coming from: Home.  Chief Complaint: Falls.  HPI: Amanda [name] is a 72 y.o. female with history of diabetes mellitus type 2, hypertension, hypothyroidism has been experiencing weakness and frequent falls over the last few months.  Patient states 2 weeks ago she did fall and hit her head but did not lose consciousness.  Last evening she again fell and she did not hit but was feeling weak and the weakness become more profound.  She decided to come to the ER.  Few days ago she has some chest tightness which resolved without any intervention.  Presently denies any chest pain or shortness of breath.  Her main concern is of weakness and falls.  ED Course: In the ER patient is found to be in A. fib which is new with heart rate around 110 bpm.  Was given p.o. metoprolol 25 mg at this time heart rate is around 90/min.  Given the weakness and new onset A. fib patient admitted for further observation.  Patient  Review of Systems: As per HPI, rest all negative.   Past Medical History:  Diagnosis Date  . Anemia due to blood loss   . Anxiety   . Arthritis   . Asthma    seasonal or with bronchitis  . Complication of anesthesia    "forgets to breathe" when she wakes up  . Depression   . Diabetes type 2, controlled ([place])    fasting 100-120  . GERD (gastroesophageal reflux disease)   . Headache   . Heart murmur   . Hepatitis    A -- as teenager  . Hyperlipidemia   . Hypertension   . Hypothyroidism   . Mitral valve prolapse   . OSA on CPAP   . Pneumonia    hx of  . Vertigo     Past Surgical History:  Procedure Laterality Date  . ABDOMINAL HYSTERECTOMY    . CHOLECYSTECTOMY    . FRACTURE SURGERY Right    leg  . GALLBLADDER SURGERY  1995  . JOINT REPLACEMENT Left    knee  . NASAL SINUS SURGERY  2005  . PARTIAL HYSTERECTOMY  1983  . THROAT SURGERY  2005    . TOTAL KNEE ARTHROPLASTY Right 09/12/2014   Procedure: TOTAL KNEE ARTHROPLASTY;  Surgeon: Meredith Pel, MD;  Location: [facility];  Service: Orthopedics;  Laterality: Right;     reports that she has never smoked. She has never used smokeless tobacco. She reports that she does not drink alcohol or use drugs.  Allergies  Allergen Reactions  . Atorvastatin Other (See Comments)    Body aches  . Aciphex [Rabeprazole Sodium] Hives  . Coumadin [Warfarin Sodium] Other (See Comments)    Bleeding   . Erythromycin Nausea And Vomiting  . Macrodantin Nausea And Vomiting  . Nsaids     Bleeding in bladder  . Omeprazole-Sodium Bicarbonate [Omeprazole-Sodium Bicarbonate] Hives  . Sulfa Antibiotics Nausea Only and Other (See Comments)    Feel terrible  . Tape Rash    Family History  Problem Relation Age of Onset  . Cancer Mother        panceratic  . Cancer - Colon Mother   . Breast cancer Maternal Aunt     Prior to Admission medications   Medication Sig Start Date End Date Taking? Authorizing Provider  acetaminophen (TYLENOL) 500 MG tablet Take 500-1,000  mg by mouth every 6 (six) hours as needed for moderate pain.   Yes [provider]  buPROPion (WELLBUTRIN XL) 300 MG 24 hr tablet Take 1 tablet (300 mg total) by mouth daily. 01/22/17  Yes Dohmeier, Asencion Partridge, MD  Cholecalciferol (VITAMIN D3) 1000 UNITS CAPS Take 1,000 Units by mouth daily.    Yes [provider]  clonazePAM (KLONOPIN) 0.5 MG tablet Take 1 tablet (0.5 mg total) by mouth daily. Patient taking differently: Take 0.5 mg by mouth 2 (two) times daily.  01/22/17  Yes Dohmeier, Asencion Partridge, MD  cycloSPORINE (RESTASIS) 0.05 % ophthalmic emulsion Place 1 drop into both eyes 2 (two) times daily.   Yes [provider]  fluticasone (FLONASE) 50 MCG/ACT nasal spray Place 1 spray into both nostrils daily as needed for allergies.  08/20/17  Yes [provider]  levothyroxine (SYNTHROID, LEVOTHROID) 25 MCG tablet  Take 25 mcg by mouth daily.     Yes [provider]  losartan (COZAAR) 25 MG tablet Take 25 mg by mouth daily.    Yes [provider]  metFORMIN (GLUCOPHAGE-XR) 500 MG 24 hr tablet Take 500 mg by mouth 2 (two) times daily.  01/20/18  Yes [provider]  metoprolol tartrate (LOPRESSOR) 25 MG tablet Take 25 mg by mouth 2 (two) times daily.   Yes [provider]  montelukast (SINGULAIR) 10 MG tablet Take 10 mg by mouth daily. 12/28/17  Yes [provider]  omeprazole (PRILOSEC) 40 MG capsule Take 40 mg by mouth daily.     Yes [provider]  oxybutynin (DITROPAN-XL) 10 MG 24 hr tablet Take 10 mg by mouth daily. 06/17/17  Yes [provider]  pravastatin (PRAVACHOL) 40 MG tablet Take 40 mg by mouth daily.   Yes [provider]  traZODone (DESYREL) 100 MG tablet Take 100 mg by mouth at bedtime.    Yes [provider]  venlafaxine (EFFEXOR) 50 MG tablet Take 2 tablets (100 mg total) by mouth daily. 11/22/15  Yes Dohmeier, Asencion Partridge, MD  acetaminophen-codeine (TYLENOL #3) 300-30 MG tablet Take 1 tablet by mouth every 8 (eight) hours as needed for moderate pain. Patient not taking: Reported on 04/29/2018 08/03/17   Meredith Pel, MD  HYDROcodone-acetaminophen (NORCO/VICODIN) 5-325 MG tablet Take 1 tablet by mouth every 12 (twelve) hours as needed for moderate pain. Patient not taking: Reported on 04/29/2018 07/29/17   Meredith Pel, MD  metFORMIN (GLUCOPHAGE) 500 MG tablet Take 1 tablet (500 mg total) by mouth 2 (two) times daily with a meal. Patient not taking: Reported on 09/27/2018 10/14/14   Thurnell Lose, MD    Physical Exam: Vitals:   09/27/18 0056 09/27/18 0300 09/27/18 0400 09/27/18 0534  BP:  (!) 126/94 (!) 126/115 (!) 134/94  Pulse:  (!) 116 97 98  Resp:  (!) 22 (!) 25 18  Temp:    98.4 F (36.9 C)  TempSrc:    Oral  SpO2:  90% 93% 95%  Weight: 81.6 kg     Height: 5\' 4"  (1.626 m)          Constitutional: Moderately built and nourished. Vitals:   09/27/18 0056 09/27/18 0300 09/27/18 0400 09/27/18 0534  BP:  (!) 126/94 (!) 126/115 (!) 134/94  Pulse:  (!) 116 97 98  Resp:  (!) 22 (!) 25 18  Temp:    98.4 F (36.9 C)  TempSrc:    Oral  SpO2:  90% 93% 95%  Weight: 81.6 kg  Height: 5\' 4"  (1.626 m)      Eyes: Anicteric no pallor. ENMT: No discharge from the ears eyes nose or mouth. Neck: No mass or.  No neck rigidity. Respiratory: No rhonchi or crepitations. Cardiovascular: S1-S2 heard no murmurs appreciated. Abdomen: Soft nontender bowel sounds present. Musculoskeletal: No edema.  No joint effusion. Skin: No rash. Neurologic: Alert awake oriented to time place and person.  Moves all extremities. Psychiatric: Appears normal.  Normal affect.   Labs on Admission: I have personally reviewed following labs and imaging studies  CBC: Recent Labs  Lab 09/27/18 0234  WBC 11.0*  NEUTROABS 8.0*  HGB 11.4*  HCT 35.9*  MCV 89.5  PLT 185   Basic Metabolic Panel: Recent Labs  Lab 09/27/18 0210  NA 138  K 4.2  CL 106  CO2 22  GLUCOSE 100*  BUN 24*  CREATININE 1.28*  CALCIUM 9.0   GFR: Estimated Creatinine Clearance: 40.5 mL/min (A) (by C-G formula based on SCr of 1.28 mg/dL (H)). Liver Function Tests: Recent Labs  Lab 09/27/18 0210  AST 18  ALT 12  ALKPHOS 52  BILITOT 1.2  PROT 6.2*  ALBUMIN 3.6   No results for input(s): LIPASE, AMYLASE in the last 168 hours. No results for input(s): AMMONIA in the last 168 hours. Coagulation Profile: Recent Labs  Lab 09/27/18 0345  INR 1.13   Cardiac Enzymes: No results for input(s): CKTOTAL, CKMB, CKMBINDEX, TROPONINI in the last 168 hours. BNP (last 3 results) No results for input(s): PROBNP in the last 8760 hours. HbA1C: No results for input(s): HGBA1C in the last 72 hours. CBG: No results for input(s): GLUCAP in the last 168 hours. Lipid Profile: No results for input(s): CHOL, HDL, LDLCALC,  TRIG, CHOLHDL, LDLDIRECT in the last 72 hours. Thyroid Function Tests: Recent Labs    09/27/18 0345  TSH 5.227*   Anemia Panel: No results for input(s): VITAMINB12, FOLATE, FERRITIN, TIBC, IRON, RETICCTPCT in the last 72 hours. Urine analysis:    Component Value Date/Time   COLORURINE YELLOW 09/27/2018 0142   APPEARANCEUR CLEAR 09/27/2018 0142   LABSPEC 1.015 09/27/2018 0142   PHURINE 5.0 09/27/2018 0142   GLUCOSEU NEGATIVE 09/27/2018 0142   HGBUR NEGATIVE 09/27/2018 0142   BILIRUBINUR NEGATIVE 09/27/2018 0142   KETONESUR 5 (A) 09/27/2018 0142   PROTEINUR NEGATIVE 09/27/2018 0142   UROBILINOGEN 0.2 10/15/2014 2020   NITRITE NEGATIVE 09/27/2018 0142   LEUKOCYTESUR SMALL (A) 09/27/2018 0142   Sepsis Labs: @LABRCNTIP (procalcitonin:4,lacticidven:4) )No results found for this or any previous visit (from the past 240 hour(s)).   Radiological Exams on Admission: No results found.  EKG: Independently reviewed.  With RVR.  Assessment/Plan Principal Problem:   Atrial fibrillation with RVR (HCC) Active Problems:   Hypertension   Diabetes type 2, controlled (HCC)   Weakness generalized    1. New onset A. fib with RVR.  Rate improved with p.o. metoprolol 25 mg given in the ER.  Patient takes scheduled dose of metoprolol 25 twice daily.  Patient's chads 2 vasc score is 4.  Patient is on heparin at this time.  Check 2D echo cardiac markers.  TSH is around 5.  Cardiology has been consulted. 2. Falls with weakness -patient states her weakness has worsened.  On exam patient appears nonfocal but patient states when she tries to walk her legs give away.  Denies any peripheral numbness.  Denies any incontinence of urine.  Will check MRI brain.  Check CK levels. 3. Hypertension on metoprolol and Cozaar. 4. Hyperlipidemia  on statins.  Check CK levels. 5. Diabetes mellitus type 2 we will keep patient on sliding scale coverage and hold metformin while inpatient. 6. Hypothyroidism on  Synthroid. 7. Depression on venlafaxine bupropion and Klonopin.   Note that patient has had psoas hematoma in 2015 while being on anticoagulation for DVT.  DVT prophylaxis: Heparin infusion. Code Status: Full code. Family Communication: Discussed with patient. Disposition Plan: To be determined. Consults called: Cardiology and physical therapy. Admission status: Observation.   Rise Patience MD [facility] Pager [phone].  If 7PM-7AM, please contact night-coverage [email or web address] Password [email or web address]  09/27/2018, 6:02 AM

## 2018-09-27 NOTE — Progress Notes (Signed)
ANTICOAGULATION CONSULT NOTE - Initial Consult  Pharmacy Consult for Heparin Indication: atrial fibrillation  Allergies  Allergen Reactions  . Atorvastatin Other (See Comments)    Body aches  . Aciphex [Rabeprazole Sodium] Hives  . Erythromycin Nausea And Vomiting  . Macrodantin Nausea And Vomiting  . Nsaids     Bleeding in bladder  . Omeprazole-Sodium Bicarbonate [Omeprazole-Sodium Bicarbonate] Hives  . Sulfa Antibiotics Nausea Only and Other (See Comments)    Feel terrible  . Tape Rash    Patient Measurements: Height: 5\' 4"  (162.6 cm) Weight: 180 lb (81.6 kg) IBW/kg (Calculated) : 54.7 Heparin Dosing Weight:   Vital Signs: Temp: 97.9 F (36.6 C) (11/11 0053) Temp Source: Oral (11/11 0053) BP: 126/94 (11/11 0300) Pulse Rate: 116 (11/11 0300)  Labs: Recent Labs    09/27/18 0210 09/27/18 0234  HGB  --  11.4*  HCT  --  35.9*  PLT  --  295  CREATININE 1.28*  --     Estimated Creatinine Clearance: 40.5 mL/min (A) (by C-G formula based on SCr of 1.28 mg/dL (H)).   Medical History: Past Medical History:  Diagnosis Date  . Anemia due to blood loss   . Anxiety   . Arthritis   . Asthma    seasonal or with bronchitis  . Complication of anesthesia    "forgets to breathe" when she wakes up  . Depression   . Diabetes type 2, controlled ([place])    fasting 100-120  . GERD (gastroesophageal reflux disease)   . Headache   . Heart murmur   . Hepatitis    A -- as teenager  . Hyperlipidemia   . Hypertension   . Hypothyroidism   . Mitral valve prolapse   . OSA on CPAP   . Pneumonia    hx of  . Vertigo     Medications:  Infusions:  . heparin      Assessment: Patient with afib and MD wishes for heparin per pharmacy.  No oral anticoagulants noted on med rec. Baseline coags ordered.  Goal of Therapy:  Heparin level 0.3-0.7 units/ml Monitor platelets by anticoagulation protocol: Yes   Plan:  Heparin bolus 3000  units iv x1 Heparin drip at 1000  units/hr Daily CBC Next heparin level at [address], [place] 09/27/2018,3:42 AM

## 2018-09-27 NOTE — Evaluation (Addendum)
Physical Therapy Evaluation Patient Details Name: Amanda [name] MRN: [id number] DOB: 06/04/1946 Today's Date: 09/27/2018   History of Present Illness  72 y.o. female with history of diabetes mellitus type 2, hypertension, B TKA, hypothyroidism has been experiencing weakness and frequent falls over the last few months.  Dx of new onset afib.   Clinical Impression  Pt admitted with above diagnosis. Pt currently with functional limitations due to the deficits listed below (see PT Problem List). Pt ambulated 120' with RW, no loss of balance, HR 110 resting, HR 122 walking. Pt reports she feels she can manage at home upon acute DC, she has friends that can assist with getting groceries, etc. HHPT recommended.  Pt will benefit from skilled PT to increase their independence and safety with mobility to allow discharge to the venue listed below.       Follow Up Recommendations Home health PT    Equipment Recommendations  None recommended by PT    Recommendations for Other Services       Precautions / Restrictions Precautions Precautions: Fall Precaution Comments: monitor HR Restrictions Weight Bearing Restrictions: No      Mobility  Bed Mobility Overal bed mobility: Modified Independent             General bed mobility comments: used bedrail  Transfers Overall transfer level: Needs assistance Equipment used: Rolling walker (2 wheeled) Transfers: Sit to/from Stand Sit to Stand: Supervision         General transfer comment: VCs for hand placement  Ambulation/Gait Ambulation/Gait assistance: Supervision Gait Distance (Feet): 120 Feet Assistive device: Rolling walker (2 wheeled) Gait Pattern/deviations: Step-through pattern Gait velocity: WFL   General Gait Details: steady with RW, no loss of balance, HR 110 at rest, 122 with walking  Stairs            Wheelchair Mobility    Modified Rankin (Stroke Patients Only)       Balance Overall balance  assessment: Modified Independent;History of Falls                                           Pertinent Vitals/Pain Pain Assessment: No/denies pain    Home Living Family/patient expects to be discharged to:: Private residence Living Arrangements: Alone   Type of Home: House Home Access: Stairs to enter Entrance Stairs-Rails: None Entrance Stairs-Number of Steps: 1 Home Layout: Two level Home Equipment: Walker - 2 wheels  Pt has a [employer] at home.      Prior Function Level of Independence: Independent with assistive device(s)         Comments: uses RW prn     Hand Dominance        Extremity/Trunk Assessment   Upper Extremity Assessment Upper Extremity Assessment: Overall WFL for tasks assessed    Lower Extremity Assessment Lower Extremity Assessment: Overall WFL for tasks assessed    Cervical / Trunk Assessment Cervical / Trunk Assessment: Normal  Communication   Communication: No difficulties  Cognition Arousal/Alertness: Awake/alert Behavior During Therapy: WFL for tasks assessed/performed Overall Cognitive Status: Within Functional Limits for tasks assessed                                        General Comments      Exercises     Assessment/Plan  PT Assessment Patient needs continued PT services  PT Problem List Decreased mobility;Decreased activity tolerance       PT Treatment Interventions Gait training    PT Goals (Current goals can be found in the Care Plan section)  Acute Rehab PT Goals Patient Stated Goal: return home PT Goal Formulation: With patient Time For Goal Achievement: 10/11/18 Potential to Achieve Goals: Good    Frequency Min 3X/week   Barriers to discharge        Co-evaluation               AM-PAC PT "6 Clicks" Daily Activity  Outcome Measure Difficulty turning over in bed (including adjusting bedclothes, sheets and blankets)?: None Difficulty moving from lying on back to  sitting on the side of the bed? : A Little Difficulty sitting down on and standing up from a chair with arms (e.g., wheelchair, bedside commode, etc,.)?: A Little Help needed moving to and from a bed to chair (including a wheelchair)?: A Little Help needed walking in hospital room?: A Little Help needed climbing 3-5 steps with a railing? : A Little 6 Click Score: 19    End of Session Equipment Utilized During Treatment: Gait belt Activity Tolerance: Patient tolerated treatment well Patient left: in chair;with call bell/phone within reach Nurse Communication: Mobility status PT Visit Diagnosis: History of falling (Z91.81);Difficulty in walking, not elsewhere classified (R26.2)    Time: 6168-3729 PT Time Calculation (min) (ACUTE ONLY): 29 min   Charges:   PT Evaluation $PT Eval Low Complexity: 1 Low PT Treatments $Gait Training: 8-22 mins       Blondell Reveal Kistler PT 09/27/2018  Acute Rehabilitation Services Pager [phone] Office [phone]

## 2018-09-28 DIAGNOSIS — I10 Essential (primary) hypertension: Secondary | ICD-10-CM | POA: Diagnosis not present

## 2018-09-28 DIAGNOSIS — I4891 Unspecified atrial fibrillation: Secondary | ICD-10-CM | POA: Diagnosis not present

## 2018-09-28 LAB — CBC
HEMATOCRIT: 36.1 % (ref 36.0–46.0)
Hemoglobin: 11.5 g/dL — ABNORMAL LOW (ref 12.0–15.0)
MCH: 28.4 pg (ref 26.0–34.0)
MCHC: 31.9 g/dL (ref 30.0–36.0)
MCV: 89.1 fL (ref 80.0–100.0)
NRBC: 0 % (ref 0.0–0.2)
PLATELETS: 277 10*3/uL (ref 150–400)
RBC: 4.05 MIL/uL (ref 3.87–5.11)
RDW: 13.2 % (ref 11.5–15.5)
WBC: 6 10*3/uL (ref 4.0–10.5)

## 2018-09-28 LAB — BASIC METABOLIC PANEL
ANION GAP: 8 (ref 5–15)
BUN: 16 mg/dL (ref 8–23)
CALCIUM: 9.1 mg/dL (ref 8.9–10.3)
CO2: 25 mmol/L (ref 22–32)
Chloride: 106 mmol/L (ref 98–111)
Creatinine, Ser: 1.23 mg/dL — ABNORMAL HIGH (ref 0.44–1.00)
GFR, EST AFRICAN AMERICAN: 49 mL/min — AB (ref >60)
GFR, EST NON AFRICAN AMERICAN: 42 mL/min — AB (ref >60)
Glucose, Bld: 96 mg/dL (ref 70–99)
Potassium: 4 mmol/L (ref 3.5–5.1)
SODIUM: 139 mmol/L (ref 135–145)

## 2018-09-28 LAB — GLUCOSE, CAPILLARY
Glucose-Capillary: 86 mg/dL (ref 70–99)
Glucose-Capillary: 153 mg/dL — ABNORMAL HIGH (ref 70–99)

## 2018-09-28 LAB — T3, FREE: T3, Free: 2.9 pg/mL (ref 2.0–4.4)

## 2018-09-28 MED ORDER — METOPROLOL TARTRATE 50 MG PO TABS
75.0000 mg | ORAL_TABLET | Freq: Two times a day (BID) | ORAL | Status: DC
  Administered 2018-09-28: 75 mg via ORAL
  Filled 2018-09-28: qty 1

## 2018-09-28 MED ORDER — APIXABAN 5 MG PO TABS: 5.0000 mg | ORAL_TABLET | Freq: Two times a day (BID) | ORAL | 0 refills | Status: AC

## 2018-09-28 MED ORDER — METOPROLOL TARTRATE 75 MG PO TABS: 75.0000 mg | ORAL_TABLET | Freq: Two times a day (BID) | ORAL | 4 refills | Status: DC

## 2018-09-28 NOTE — Progress Notes (Addendum)
Progress Note  Patient Name: Amanda [name] Date of Encounter: 09/28/2018  Primary Cardiologist: Ena Dawley, MD   Subjective   The patient is feeling much better today.  She says that her weakness has greatly improved.  She occasionally feels an irregular heartbeat in her chest but no chest discomfort, shortness of breath or lightheadedness.  Inpatient Medications    Scheduled Meds: . apixaban  5 mg Oral BID  . buPROPion  300 mg Oral Daily  . clonazePAM  0.5 mg Oral BID  . insulin aspart  0-9 Units Subcutaneous TID WC  . levothyroxine  25 mcg Oral Q0600  . losartan  25 mg Oral Daily  . metoprolol tartrate  50 mg Oral BID  . montelukast  10 mg Oral Daily  . oxybutynin  10 mg Oral Daily  . pantoprazole  40 mg Oral Daily  . pravastatin  40 mg Oral Daily  . traZODone  100 mg Oral QHS  . venlafaxine  100 mg Oral Daily   Continuous Infusions:  PRN Meds: acetaminophen **OR** acetaminophen   Vital Signs    Vitals:   09/27/18 1231 09/27/18 1419 09/27/18 2200 09/28/18 0438  BP:  123/77 123/84 (!) 126/98  Pulse: (!) 122 97 80 83  Resp:  (!) 24 20 20   Temp:   97.8 F (36.6 C) 97.6 F (36.4 C)  TempSrc:   Oral Oral  SpO2:  96% 92% 92%  Weight:      Height:       No intake or output data in the 24 hours ending 09/28/18 0800 Filed Weights   09/27/18 0056  Weight: 81.6 kg    Telemetry    A. fib with rates 90s-low 100s- Personally Reviewed  ECG    No new tracings- Personally Reviewed  Physical Exam   GEN: No acute distress.   Neck: No JVD Cardiac:  Irregularly irregular rhythm, no murmurs, rubs, or gallops.  Respiratory: Clear to auscultation bilaterally. GI: Soft, nontender, non-distended  MS: No edema; No deformity. Neuro:  Nonfocal  Psych: Normal affect   Labs    Chemistry Recent Labs  Lab 09/27/18 0210 09/28/18 0510  NA 138 139  K 4.2 4.0  CL 106 106  CO2 22 25  GLUCOSE 100* 96  BUN 24* 16  CREATININE 1.28* 1.23*  CALCIUM 9.0 9.1    PROT 6.2*  --   ALBUMIN 3.6  --   AST 18  --   ALT 12  --   ALKPHOS 52  --   BILITOT 1.2  --   GFRNONAA 40* 42*  GFRAA 47* 49*  ANIONGAP 10 8     Hematology Recent Labs  Lab 09/27/18 0234 09/28/18 0510  WBC 11.0* 6.0  RBC 4.01 4.05  HGB 11.4* 11.5*  HCT 35.9* 36.1  MCV 89.5 89.1  MCH 28.4 28.4  MCHC 31.8 31.9  RDW 13.0 13.2  PLT 295 277    Cardiac Enzymes Recent Labs  Lab 09/27/18 0636 09/27/18 1129 09/27/18 1723  TROPONINI <0.03 <0.03 <0.03    Recent Labs  Lab 09/27/18 0216  TROPIPOC 0.02     BNPNo results for input(s): BNP, PROBNP in the last 168 hours.   DDimer No results for input(s): DDIMER in the last 168 hours.   Radiology    No results found.  Cardiac Studies   Echocardiogram 09/27/2018 Study Conclusions - Left ventricle: The cavity size was normal. Wall thickness was   normal. Systolic function was normal. The estimated ejection  fraction was in the range of 55% to 60%. Although no diagnostic   regional wall motion abnormality was identified, this possibility   cannot be completely excluded on the basis of this study. The   study was not technically sufficient to allow evaluation of LV   diastolic dysfunction due to atrial fibrillation. - Aortic valve: There was no stenosis. There was trivial   regurgitation. - Mitral valve: Mildly calcified annulus. Mild posterior leaflet   prolapse. There was moderate regurgitation. - Left atrium: The atrium was moderately dilated. - Right ventricle: The cavity size was mildly dilated. Systolic   function was mildly reduced. - Right atrium: The atrium was moderately dilated. - Tricuspid valve: Peak RV-RA gradient (S): 32 mm Hg. - Pulmonary arteries: PA peak pressure: 35 mm Hg (S). - Inferior vena cava: The vessel was normal in size. The   respirophasic diameter changes were in the normal range (>= 50%),   consistent with normal central venous pressure. - Pericardium, extracardiac: A trivial  pericardial effusion was   identified.  Impressions: - The patient was in atrial fibrillation. Normal LV size with EF   55-60%. Mildly dilated RV with mildly decreased systolic   function. Mild posterior mitral leaflet prolapse with moderate   mitral regurgitation. Biatrial enlargement. Mild pulmonary   hypertension.  Patient Profile     72 y.o. female with a hx of hypertension, hyperlipidemia, hypothyroidism, GERD, type 2 diabetes, asthma, OSA on CPAP who is being seen for the evaluation of new onset atrial fibrillation of unclear duration.   Assessment & Plan    Atrial fibrillation -Metoprolol increased to 50 mg twice daily for improved rate control -Continues in A. fib with rates 90s-low 100s.  Will increase metoprolol to 75 mg twice daily for improved rate control. -Echocardiogram done yesterday showed normal LV systolic function with EF 55-60%, MVP with moderate MR, moderately dilated bilateral atria, mild pulmonary hypertension -CHA2DS2/VAS Stroke Risk Score is 4 (HTN, Age, DM, female).  Eliquis 5 mg BID has been initiated for stroke risk reduction.  -Plan for 4 weeks of anticoagulation, outpatient follow-up and if she does not cardiovert on her own we would plan for outpatient cardioversion after 4 weeks of full anticoagulation. -From a cardiac standpoint she is likely ready to go home today.  I will arrange for follow-up in our office.  Hypertension -Home medications include losartan 25 mg daily, metoprolol tartrate 25 mg twice daily.  -Metoprolol now increased to 50 mg twice daily for better rate control -Systolic blood pressure well controlled, diastolic BP continues to be intermittently elevated -We will increase metoprolol to 75 mg twice daily as above for rate control.  CKD stage 3 -Scr 1.28 on admission, 1.23 today.   Hyperlipidemia -On pravastatin 40 mg daily -Last lipid panel in care everywhere, 07/16/2016, TC 178, LDL 104  OSA -Patient states that she is  compliant with CPAP.  We discussed how this is important in the treatment of atrial fibrillation.  Hypothyroidism -TSH is mildly elevated at 5.227.  Free T4 is normal -Continues on levothyroxine  Frequent falls -Worse over the last few years.  The patient is on oxybutynin for significant urinary incontinence, which can be associated with dizziness and hypotension.  Atrial fibrillation with RVR may have contributed to lower blood pressure as well.  She also has a history of cerebral palsy and she feels like some of her weakness and falls is related to this.  Mitral valve prolapse -With moderate regurgitation.  I do not appreciate  a murmur today. -We will follow this in our office  For questions or updates, please contact [place] Please consult [email or web address] for contact info under     Signed, Daune Perch, NP  09/28/2018, 8:00 AM    The patient was seen, examined and discussed with Daune Perch, NP-C and I agree with the above.   72 year old female with prior medical history of hypertension, hyperlipidemia, hypothyroidism who was admitted after multiple falls and significant weight loss and was found to be in atrial fibrillation with RVR.  Her echocardiogram performed today shows normal LVEF 55 to 60%, since the patient is in atrial fibrillation we do not have information about diastolic function.  There is moderate mitral regurgitation with mild posterior leaflet prolapse, left atrium is moderately dilated, right ventricle systolic function is mildly reduced patient's right-sided pressures are mildly elevated with RVSP of 38 mmHg.  Overnight significantly improved ventricular rates, now in 80', the patient also feels significantly better.  Continue metoprolol 50 mg PO BID and Eliquis 5 mg PO BID.  The plan would be 4 weeks of anticoagulation, outpatient follow-up and if she does not cardiovert on her own and bring her for an outpatient cardioversion after 4 weeks of full  anticoagulation. We will arrange for a follow up.  [facility] will sign off.   Medication Recommendations:  As above Other recommendations (labs, testing, etc):  No further testing Follow up as an outpatient:  We will arrange prior to discharge  Ena Dawley, MD 09/28/2018

## 2018-09-28 NOTE — Care Management Obs Status (Signed)
[place] NOTIFICATION   Patient Details  Name: Amanda [name] MRN: [id number] Date of Birth: 31-Dec-1945   Medicare Observation Status Notification Given:   yes    Purcell Mouton, RN 09/28/2018, 12:13 PM

## 2018-09-28 NOTE — Progress Notes (Signed)
Assumed care of pt @ 2300. I agree with the assessment completed by previous RN.

## 2018-09-28 NOTE — Care Management (Signed)
#    4   S/W   NIKI   @   [facility]  RX # [id number]   ELIQUIS  10 MG : NONE FORMULARY  1. ELIQUIS  2.5 MG BID COVER- YES CO-PAY- $ 45.00   TIER-  E DRUG PRIOR APPROVAL-NO  2. ELIQUIS  5 MG BID COVER- YES CO-PAY- $ 45.00 TIER- 3 DRUG PRIOR APPROVAL- NO  3 XARELTO   15 MG BID COVER- YES CO-PAY-  $ 45.00 TIER- 3 DRUG PRIOR APPROVAL- NO  4. XARELTO  20 MG DAILY COVER- YES CO-PAY- $ 45.00 TIER- 3 DRUG PRIOR APPROVAL- NO  5. LOVENOX  80 MG BID SYRINGE COVER- NONE FORMULARY PRIOR  APPROVAL- YES  # [id number]  5. ENOXAPARIN  80 MG BID SYRINGE COVER- YES CO-PAY- $ 95.00 TIER -  4 DRUG PRIOR APPROVAL- NO  NO DEDUCTIBLE  PREFERRED PHARMACY : YES [facility]  AND [facility]

## 2018-09-28 NOTE — Discharge Summary (Signed)
Physician Discharge Summary  VAISHNAVI DALBY FYB:[id number] DOB: 06-11-1946 DOA: 09/27/2018  PCP: Aretta Nip, MD  Admit date: 09/27/2018 Discharge date: 09/28/2018  Admitted From: Home  Disposition:  Home   Recommendations for Outpatient Follow-up:  1. Follow up with PCP in 1-2 weeks 2. Follow up with Cardiology in 4 weeks for consideration of cardioversion 3. Please obtain BMP in 1 week to follow up creatinine and CBC in 1 month to re-evaluate anemia    Home Health: None  Equipment/Devices: None  Discharge Condition: Good  CODE STATUS: FULL Diet recommendation: Cardiac  Brief/Interim Summary: Amanda [name] is a 72 y.o. F with HTN, DM, hypothyroidism who presents with several weeks progressive dyspnea.  Found to have Afib with RVR.     PRINCIPAL HOSPITAL DIAGNOSIS: New onset atrial fibrillation    Discharge Diagnoses:     New onset atrial fibrillation with RVR CHA2DS2-VASc Score = 4 (age, F, HTN, DM).  HASBLED 2 (age and history of bleeding), representing a 5-7% risk of stroke vs 5% risk of bleeding.  Patient favors anticoagulation under circumstances.  Anticipatory guidance re: bleeding was reviewed.  TSH high normal.  Echo showed normal EF, mitral valve prolapse and moderate MR.  Eliquis approved by Cardiology.  Started on Eliquis.  Metoprolol titrated to 75 mg BID and HR controlled.  To follow up with Cardiology in 1 month for consideration cardioversion.     Falls and weakness The patient reports global weakness, no focal weakness.  Agree with Cardiology, this is likely from Klonopin, oxybutynin.  Her acute weakness was considerably better after HR normalized with metoprolol.    -Recommend to explore alternatives to oxybutynin, taper Klonopin  Hypertension Well-controlled  Diabetes  Hypothyroidism  Chronic kidney disease Baseline creatinine 1.2, stable relative to baseline  Mild anemia Unclear cause -Follow-up as an  outpatient         Discharge Instructions  Discharge Instructions    Diet - low sodium heart healthy   Complete by:  As directed    Discharge instructions   Complete by:  As directed    From Dr. Loleta Books: You were admitted with new onset atrial fibrillation.  With atrial fibrillation, we worry about two things: fast heart rates, and strokes.  For controlling fast heart rates, we have increased your metoprolol, which is a heart rate slowing medicine. Take metoprolol 75 mg twice daily  For preventing strokes, take apixaban/Eliquis: Take Eliquis 5 mg twice daily   The Cardiology office will call you for a follow up appointment in 4 weeks. If you haven't heard from them in 1 week call [phone] to follow up.   Call your primary care doctor for a follow up appointment in 1 week   Look up "Beers criteria" for unsafe medications in the elderly  Also, contact your pharmacy today to verify the copay on your Eliquis after the first month.  If it will be high, call your primary immediately to come up with an alternative plan.   And consider going to Medicare.gov to enroll in a medicare plan that covers prescription drugs.   Increase activity slowly   Complete by:  As directed      Allergies as of 09/28/2018      Reactions   Atorvastatin Other (See Comments)   Body aches   Aciphex [rabeprazole Sodium] Hives   Coumadin [warfarin Sodium] Other (See Comments)   Bleeding   Erythromycin Nausea And Vomiting   Macrodantin Nausea And Vomiting   Nsaids  Bleeding in bladder   Omeprazole-sodium Bicarbonate [omeprazole-sodium Bicarbonate] Hives   Sulfa Antibiotics Nausea Only, Other (See Comments)   Feel terrible   Tape Rash      Medication List    STOP taking these medications   acetaminophen-codeine 300-30 MG tablet Commonly known as:  TYLENOL #3   HYDROcodone-acetaminophen 5-325 MG tablet Commonly known as:  NORCO/VICODIN     TAKE these medications    acetaminophen 500 MG tablet Commonly known as:  TYLENOL Take 500-1,000 mg by mouth every 6 (six) hours as needed for moderate pain.   apixaban 5 MG Tabs tablet Commonly known as:  ELIQUIS Take 1 tablet (5 mg total) by mouth 2 (two) times daily.   buPROPion 300 MG 24 hr tablet Commonly known as:  WELLBUTRIN XL Take 1 tablet (300 mg total) by mouth daily.   clonazePAM 0.5 MG tablet Commonly known as:  KLONOPIN Take 1 tablet (0.5 mg total) by mouth daily. What changed:  when to take this   cycloSPORINE 0.05 % ophthalmic emulsion Commonly known as:  RESTASIS Place 1 drop into both eyes 2 (two) times daily.   fluticasone 50 MCG/ACT nasal spray Commonly known as:  FLONASE Place 1 spray into both nostrils daily as needed for allergies.   levothyroxine 25 MCG tablet Commonly known as:  SYNTHROID, LEVOTHROID Take 25 mcg by mouth daily.   losartan 25 MG tablet Commonly known as:  COZAAR Take 25 mg by mouth daily.   metFORMIN 500 MG tablet Commonly known as:  GLUCOPHAGE Take 1 tablet (500 mg total) by mouth 2 (two) times daily with a meal.   metFORMIN 500 MG 24 hr tablet Commonly known as:  GLUCOPHAGE-XR Take 500 mg by mouth 2 (two) times daily.   Metoprolol Tartrate 75 MG Tabs Take 75 mg by mouth 2 (two) times daily. What changed:    medication strength  how much to take   montelukast 10 MG tablet Commonly known as:  SINGULAIR Take 10 mg by mouth daily.   omeprazole 40 MG capsule Commonly known as:  PRILOSEC Take 40 mg by mouth daily.   oxybutynin 10 MG 24 hr tablet Commonly known as:  DITROPAN-XL Take 10 mg by mouth daily.   pravastatin 40 MG tablet Commonly known as:  PRAVACHOL Take 40 mg by mouth daily.   traZODone 100 MG tablet Commonly known as:  DESYREL Take 100 mg by mouth at bedtime.   venlafaxine 50 MG tablet Commonly known as:  EFFEXOR Take 2 tablets (100 mg total) by mouth daily.   Vitamin D3 25 MCG (1000 UT) Caps Take 1,000 Units by mouth  daily.      Follow-up Information    Dorothy Spark, MD Follow up.   Specialty:  Cardiology Why:  Cardiology hospital follow up on November 19th at 8:40 am. Please arrive 15 minutes early for check in.  Contact information: [address] [place] [phone]          Allergies  Allergen Reactions  . Atorvastatin Other (See Comments)    Body aches  . Aciphex [Rabeprazole Sodium] Hives  . Coumadin [Warfarin Sodium] Other (See Comments)    Bleeding   . Erythromycin Nausea And Vomiting  . Macrodantin Nausea And Vomiting  . Nsaids     Bleeding in bladder  . Omeprazole-Sodium Bicarbonate [Omeprazole-Sodium Bicarbonate] Hives  . Sulfa Antibiotics Nausea Only and Other (See Comments)    Feel terrible  . Tape Rash    Consultations:  Cardiology   Procedures/Studies:  No results found.    Subjective: Feeling well.  No chest pain, no palpitations.   Discharge Exam: Vitals:   09/27/18 2200 09/28/18 0438  BP: 123/84 (!) 126/98  Pulse: 80 83  Resp: 20 20  Temp: 97.8 F (36.6 C) 97.6 F (36.4 C)  SpO2: 92% 92%   Vitals:   09/27/18 1231 09/27/18 1419 09/27/18 2200 09/28/18 0438  BP:  123/77 123/84 (!) 126/98  Pulse: (!) 122 97 80 83  Resp:  (!) 24 20 20   Temp:   97.8 F (36.6 C) 97.6 F (36.4 C)  TempSrc:   Oral Oral  SpO2:  96% 92% 92%  Weight:      Height:        General: Pt is alert, awake, not in acute distress Cardiovascular: RRR, nl S1-S2, no murmurs appreciated.   No LE edema.   Respiratory: Normal respiratory rate and rhythm.  CTAB without rales or wheezes. Abdominal: Abdomen soft and non-tender.  No distension or HSM.   Neuro/Psych: Strength symmetric in upper and lower extremities.  Judgment and insight appear normal.   The results of significant diagnostics from this hospitalization (including imaging, microbiology, ancillary and laboratory) are listed below for reference.     Microbiology: No results  found for this or any previous visit (from the past 240 hour(s)).   Labs: BNP (last 3 results) No results for input(s): BNP in the last 8760 hours. Basic Metabolic Panel: Recent Labs  Lab 09/27/18 0210 09/28/18 0510  NA 138 139  K 4.2 4.0  CL 106 106  CO2 22 25  GLUCOSE 100* 96  BUN 24* 16  CREATININE 1.28* 1.23*  CALCIUM 9.0 9.1   Liver Function Tests: Recent Labs  Lab 09/27/18 0210  AST 18  ALT 12  ALKPHOS 52  BILITOT 1.2  PROT 6.2*  ALBUMIN 3.6   No results for input(s): LIPASE, AMYLASE in the last 168 hours. No results for input(s): AMMONIA in the last 168 hours. CBC: Recent Labs  Lab 09/27/18 0234 09/28/18 0510  WBC 11.0* 6.0  NEUTROABS 8.0*  --   HGB 11.4* 11.5*  HCT 35.9* 36.1  MCV 89.5 89.1  PLT 295 277   Cardiac Enzymes: Recent Labs  Lab 09/27/18 0636 09/27/18 1129 09/27/18 1723  TROPONINI <0.03 <0.03 <0.03   BNP: Invalid input(s): POCBNP CBG: Recent Labs  Lab 09/27/18 1124 09/27/18 1633 09/27/18 2204 09/28/18 0741 09/28/18 1150  GLUCAP 131* 94 143* 86 153*   D-Dimer No results for input(s): DDIMER in the last 72 hours. Hgb A1c No results for input(s): HGBA1C in the last 72 hours. Lipid Profile No results for input(s): CHOL, HDL, LDLCALC, TRIG, CHOLHDL, LDLDIRECT in the last 72 hours. Thyroid function studies Recent Labs    09/27/18 0345  TSH 5.227*  T3FREE 2.9   Anemia work up No results for input(s): VITAMINB12, FOLATE, FERRITIN, TIBC, IRON, RETICCTPCT in the last 72 hours. Urinalysis    Component Value Date/Time   COLORURINE YELLOW 09/27/2018 0142   APPEARANCEUR CLEAR 09/27/2018 0142   LABSPEC 1.015 09/27/2018 0142   PHURINE 5.0 09/27/2018 0142   GLUCOSEU NEGATIVE 09/27/2018 0142   HGBUR NEGATIVE 09/27/2018 0142   BILIRUBINUR NEGATIVE 09/27/2018 0142   KETONESUR 5 (A) 09/27/2018 0142   PROTEINUR NEGATIVE 09/27/2018 0142   UROBILINOGEN 0.2 10/15/2014 2020   NITRITE NEGATIVE 09/27/2018 0142   LEUKOCYTESUR SMALL (A)  09/27/2018 0142   Sepsis Labs Invalid input(s): PROCALCITONIN,  WBC,  LACTICIDVEN Microbiology No  results found for this or any previous visit (from the past 240 hour(s)).   Time coordinating discharge: 40 minutes       SIGNED:   Edwin Dada, MD  [facility] 09/28/2018, 10:12 PM

## 2018-09-30 ENCOUNTER — Telehealth: Payer: Self-pay | Admitting: Physician Assistant

## 2018-09-30 NOTE — Telephone Encounter (Signed)
Went over the pts discharge meds and instructions with her over the phone, from her recent discharge from the hospital on 09/28/18.  Provided the pt our office location and her appt date and time.  Advised the pt to bring all her meds to her appt with Dr Meda Coffee on 10/05/18 at [place]. Advised the pt to arrive 15 mins prior to that appt.  All questions answered for the pt regarding her discharge instructions. Pt verbalized understanding and agrees with this plan.  Pt more than gracious for all the assistance provided.

## 2018-09-30 NOTE — Telephone Encounter (Signed)
From Dr. Loleta Books: You were admitted with new onset atrial fibrillation.  With atrial fibrillation, we worry about two things: fast heart rates, and strokes.  For controlling fast heart rates, we have increased your metoprolol, which is a heart rate slowing medicine. Take metoprolol 75 mg twice daily  For preventing strokes, take apixaban/Eliquis: Take Eliquis 5 mg twice daily   The Cardiology office will call you for a follow up appointment in 4 weeks. If you haven't heard from them in 1 week call [phone] to follow up.   Call your primary care doctor for a follow up appointment in 1 week   Look up "Beers criteria" for unsafe medications in the elderly  Also, contact your pharmacy today to verify the copay on your Eliquis after the first month.  If it will be high, call your primary immediately to come up with an alternative plan.   And consider going to Medicare.gov to enroll in a medicare plan that covers prescription drugs.   Increase activity slowly

## 2018-09-30 NOTE — Telephone Encounter (Signed)
New Message         Pt c/o medication issue:  1. Name of Medication: Metoprolol   2. How are you currently taking this medication (dosage and times per day)? 1 in AM 1 at PM  3. Are you having a reaction (difficulty breathing--STAT)? No   4. What is your medication issue? Patient was just recently discharged and was given different instruction on how to take the Metoprolol, pls call and advise. She is a patient of [facility].

## 2018-10-05 ENCOUNTER — Encounter: Payer: Self-pay | Admitting: Cardiology

## 2018-10-05 ENCOUNTER — Encounter: Payer: Self-pay | Admitting: *Deleted

## 2018-10-05 ENCOUNTER — Encounter (INDEPENDENT_AMBULATORY_CARE_PROVIDER_SITE_OTHER): Payer: Self-pay

## 2018-10-05 ENCOUNTER — Ambulatory Visit: Payer: Medicare Other | Admitting: Cardiology

## 2018-10-05 VITALS — BP 108/80 | HR 118 | Ht 64.0 in | Wt 177.8 lb

## 2018-10-05 DIAGNOSIS — I10 Essential (primary) hypertension: Secondary | ICD-10-CM

## 2018-10-05 DIAGNOSIS — R42 Dizziness and giddiness: Secondary | ICD-10-CM

## 2018-10-05 DIAGNOSIS — I4891 Unspecified atrial fibrillation: Secondary | ICD-10-CM

## 2018-10-05 DIAGNOSIS — E782 Mixed hyperlipidemia: Secondary | ICD-10-CM

## 2018-10-05 NOTE — Patient Instructions (Addendum)
Medication Instructions:   Your physician recommends that you continue on your current medications as directed. Please refer to the Current Medication list given to you today.    Testing/Procedures:  Dear Amanda [name]  You are scheduled for a TEE/Cardioversion/TEE Cardioversion on Monday 10/11/18  with Dr. Meda Coffee.  Please arrive at the [facility] (Main Entrance A) at [facility]: [place], [place] [address] at 7:15 am. (1 hour prior to procedure unless lab work is needed; if lab work is needed arrive 1.5 hours ahead)  DIET: Nothing to eat or drink after midnight except a sip of water with medications (see medication instructions below)  Medication Instructions: Hold [place] your anticoagulant: ELIQUIS You will need to continue your anticoagulant after your procedure until you  are told by your  Provider that it is safe to stop   Labs: If patient is on Coumadin, patient needs pt/INR, CBC, BMET within 3 days (No pt/INR needed for patients taking Xarelto, Eliquis, Pradaxa) For patients receiving anesthesia for TEE and all Cardioversion patients: BMET, CBC within 1 week  Come to: [place]  (Lab option #3) your lab work will be done at the hospital prior to your procedure - you will need to arrive 1  hours ahead of your procedure  You must have a responsible person to drive you home and stay in the waiting area during your procedure. Failure to do so could result in cancellation.  Bring your insurance cards.  *Special Note: Every effort is made to have your procedure done on time. Occasionally there are emergencies that occur at the hospital that may cause delays. Please be patient if a delay does occur.     Follow-Up:  2 WEEKS WITH AN EXTENDER FOR FOLLOW-UP OF TEE/CARDIOVERSION  Any Other Special Instructions Will Be Listed Below (If Applicable).     If  you need a refill on your cardiac medications before your next appointment, please call your pharmacy.

## 2018-10-05 NOTE — Progress Notes (Signed)
Cardiology Office Note:    Date:  10/05/2018   ID:  Amanda [name], DOB [birth year], MRN [id number]  PCP:  Aretta Nip, MD  Cardiologist:  Ena Dawley, MD  Electrophysiologist:  None   Referring MD: Aretta Nip, MD   Chief complaint: Palpitations, dizziness  History of Present Illness:    Amanda [name] is a 72 y.o. female with a hx of hypertension, hyperlipidemia, hypothyroidism who was admitted to [facility] on November 11 after multiple falls and significant weight loss and was found to be in atrial fibrillation with RVR. Her echocardiogram performed today shows normal LVEF 55 to 60%, since the patient is in atrial fibrillation we do not have information about diastolic function. There is moderate mitral regurgitation with mild posterior leaflet prolapse, left atrium is moderately dilated, right ventricle systolic function is mildly reduced patient's right-sided pressures are mildly elevated with RVSP of 38 mmHg. She was discharged on metoprolol 75 mg PO BID and Eliquis 5 mg PO BID.  the plan was for cardioversion after 4 weeks of anticoagulation as outpatient.  However today she is coming in states that her heart has been beating real fast, she is more dizzy than usual and has had couple presyncopal episodes.  No syncope or falls.  No bleeding.  Past Medical History:  Diagnosis Date  . Anemia due to blood loss   . Anxiety   . Arthritis   . Asthma    seasonal or with bronchitis  . Complication of anesthesia    "forgets to breathe" when she wakes up  . Depression   . Diabetes type 2, controlled ([place])    fasting 100-120  . GERD (gastroesophageal reflux disease)   . Headache   . Heart murmur   . Hepatitis    A -- as teenager  . Hyperlipidemia   . Hypertension   . Hypothyroidism   . Mitral valve prolapse   . OSA on CPAP   . Pneumonia    hx of  . Vertigo    Past Surgical History:  Procedure Laterality Date  . ABDOMINAL HYSTERECTOMY    .  CHOLECYSTECTOMY    . FRACTURE SURGERY Right    leg  . GALLBLADDER SURGERY  1995  . JOINT REPLACEMENT Left    knee  . NASAL SINUS SURGERY  2005  . PARTIAL HYSTERECTOMY  1983  . THROAT SURGERY  2005  . TOTAL KNEE ARTHROPLASTY Right 09/12/2014   Procedure: TOTAL KNEE ARTHROPLASTY;  Surgeon: Meredith Pel, MD;  Location: [facility];  Service: Orthopedics;  Laterality: Right;   Current Medications: Current Meds  Medication Sig  . acetaminophen (TYLENOL) 500 MG tablet Take 500-1,000 mg by mouth every 6 (six) hours as needed for moderate pain.  Marland Kitchen apixaban (ELIQUIS) 5 MG TABS tablet Take 1 tablet (5 mg total) by mouth 2 (two) times daily.  Marland Kitchen buPROPion (WELLBUTRIN XL) 300 MG 24 hr tablet Take 1 tablet (300 mg total) by mouth daily.  . Cholecalciferol (VITAMIN D3) 1000 UNITS CAPS Take 1,000 Units by mouth daily.   . clonazePAM (KLONOPIN) 0.5 MG tablet Take 1 tablet (0.5 mg total) by mouth daily. (Patient taking differently: Take 0.5 mg by mouth 2 (two) times daily. )  . cycloSPORINE (RESTASIS) 0.05 % ophthalmic emulsion Place 1 drop into both eyes 2 (two) times daily.  . fluticasone (FLONASE) 50 MCG/ACT nasal spray Place 1 spray into both nostrils daily as needed for allergies.   Marland Kitchen levothyroxine (SYNTHROID, LEVOTHROID) 25 MCG  tablet Take 25 mcg by mouth daily.    Marland Kitchen losartan (COZAAR) 25 MG tablet Take 25 mg by mouth daily.   . metFORMIN (GLUCOPHAGE) 500 MG tablet Take 1 tablet (500 mg total) by mouth 2 (two) times daily with a meal.  . metFORMIN (GLUCOPHAGE-XR) 500 MG 24 hr tablet Take 500 mg by mouth 2 (two) times daily.   . metoprolol tartrate 75 MG TABS Take 75 mg by mouth 2 (two) times daily.  . montelukast (SINGULAIR) 10 MG tablet Take 10 mg by mouth daily.  Marland Kitchen omeprazole (PRILOSEC) 40 MG capsule Take 40 mg by mouth daily.    Marland Kitchen oxybutynin (DITROPAN-XL) 10 MG 24 hr tablet Take 10 mg by mouth daily.  . pravastatin (PRAVACHOL) 40 MG tablet Take 40 mg by mouth daily.  . traZODone (DESYREL) 100 MG  tablet Take 100 mg by mouth at bedtime.   Marland Kitchen venlafaxine (EFFEXOR) 50 MG tablet Take 2 tablets (100 mg total) by mouth daily.     Allergies:   Atorvastatin; Aciphex [rabeprazole sodium]; Coumadin [warfarin sodium]; Erythromycin; Macrodantin; Nsaids; Omeprazole-sodium bicarbonate [omeprazole-sodium bicarbonate]; Sulfa antibiotics; and Tape   Social History   Socioeconomic History  . Marital status: Married    Spouse name: Not on file  . Number of children: Not on file  . Years of education: Not on file  . Highest education level: Not on file  Occupational History  . Occupation: retired    Comment: Secondary school teacher  Social Needs  . Financial resource strain: Not on file  . Food insecurity:    Worry: Not on file    Inability: Not on file  . Transportation needs:    Medical: Not on file    Non-medical: Not on file  Tobacco Use  . Smoking status: Never Smoker  . Smokeless tobacco: Never Used  Substance and Sexual Activity  . Alcohol use: No    Comment: socially  . Drug use: No  . Sexual activity: Not on file  Lifestyle  . Physical activity:    Days per week: Not on file    Minutes per session: Not on file  . Stress: Not on file  Relationships  . Social connections:    Talks on phone: Not on file    Gets together: Not on file    Attends religious service: Not on file    Active member of club or organization: Not on file    Attends meetings of clubs or organizations: Not on file    Relationship status: Not on file  Other Topics Concern  . Not on file  Social History Narrative  . Not on file    Family History: The patient's family history includes Breast cancer in her maternal aunt; Cancer in her mother; Cancer - Colon in her mother.  ROS:   Please see the history of present illness.    All other systems reviewed and are negative.  EKGs/Labs/Other Studies Reviewed:    The following studies were reviewed today:  EKG:  EKG is ordered today.  The ekg ordered today  demonstrates atrial fibrillation with rapid ventricular response, 118 bpm, this was personally reviewed.  Recent Labs: 09/27/2018: ALT 12; TSH 5.227 09/28/2018: BUN 16; Creatinine, Ser 1.23; Hemoglobin 11.5; Platelets 277; Potassium 4.0; Sodium 139  Recent Lipid Panel No results found for: CHOL, TRIG, HDL, CHOLHDL, VLDL, LDLCALC, LDLDIRECT  Physical Exam:    VS:  BP 108/80   Pulse (!) 118   Ht 5\' 4"  (1.626 m)   Wt  177 lb 12.8 oz (80.6 kg)   SpO2 96%   BMI 30.52 kg/m     Wt Readings from Last 3 Encounters:  10/05/18 177 lb 12.8 oz (80.6 kg)  09/27/18 180 lb (81.6 kg)  09/01/18 183 lb 12.8 oz (83.4 kg)     GEN: Well nourished, well developed in no acute distress HEENT: Normal NECK: No JVD; No carotid bruits LYMPHATICS: No lymphadenopathy CARDIAC: iRRR, no murmurs, rubs, gallops RESPIRATORY: Trivial crackles at bases bilaterally, wheezing or rhonchi  ABDOMEN: Soft, non-tender, non-distended MUSCULOSKELETAL:  No edema; No deformity  SKIN: Warm and dry NEUROLOGIC:  Alert and oriented x 3 PSYCHIATRIC:  Normal affect   ASSESSMENT:    1. Atrial fibrillation with RVR (HCC)    PLAN:    In order of problems listed above:  Atrial fibrillation with persistent RVR -Worsening dizziness, no room to increase metoprolol she is hypotensive -CHA2DS2/VAS Stroke RiskScore is 4 (HTN, Age, DM, female). -Continue Eliquis 500 mg p.o. twice daily -Continue metoprolol 75 mg p.o. twice daily -Schedule outpatient TEE/cardioversion as soon as possible.  Hypertension -She is rather hypotensive.  CKD stage 3  Hyperlipidemia -On pravastatin 40 mg daily  OSA -Patient states that she is compliant with CPAP.  We discussed how this is important in the treatment of atrial fibrillation.  Hypothyroidism -TSH is mildly elevated at 5.227. Free T4 is normal -Continues on levothyroxine  Frequent falls -Worse over the last few years.  The patient is on oxybutynin for significant urinary  incontinence, which can be associated with dizziness and hypotension.  Atrial fibrillation with RVR may have contributed to lower blood pressure as well.  She also has a history of cerebral palsy and she feels like some of her weakness and falls is related to this.  Mitral valve prolapse -With moderate regurgitation.  I do not appreciate a murmur today. -We will follow this in our office  Follow-up with a PA in 2 weeks.  Medication Adjustments/Labs and Tests Ordered: Current medicines are reviewed at length with the patient today.  Concerns regarding medicines are outlined above.  Orders Placed This Encounter  Procedures  . EKG 12-Lead   No orders of the defined types were placed in this encounter.   There are no Patient Instructions on file for this visit.   Signed, Ena Dawley, MD  10/05/2018 9:21 AM    [place]

## 2018-10-05 NOTE — H&P (View-Only) (Signed)
Cardiology Office Note:    Date:  10/05/2018   ID:  Amanda [name], DOB 09/06/1946, MRN [id number]  PCP:  Aretta Nip, MD  Cardiologist:  Ena Dawley, MD  Electrophysiologist:  None   Referring MD: Aretta Nip, MD   Chief complaint: Palpitations, dizziness  History of Present Illness:    Amanda [name] is a 72 y.o. female with a hx of hypertension, hyperlipidemia, hypothyroidism who was admitted to [facility] on November 11 after multiple falls and significant weight loss and was found to be in atrial fibrillation with RVR. Her echocardiogram performed today shows normal LVEF 55 to 60%, since the patient is in atrial fibrillation we do not have information about diastolic function. There is moderate mitral regurgitation with mild posterior leaflet prolapse, left atrium is moderately dilated, right ventricle systolic function is mildly reduced patient's right-sided pressures are mildly elevated with RVSP of 38 mmHg. She was discharged on metoprolol 75 mg PO BID and Eliquis 5 mg PO BID.  the plan was for cardioversion after 4 weeks of anticoagulation as outpatient.  However today she is coming in states that her heart has been beating real fast, she is more dizzy than usual and has had couple presyncopal episodes.  No syncope or falls.  No bleeding.  Past Medical History:  Diagnosis Date  . Anemia due to blood loss   . Anxiety   . Arthritis   . Asthma    seasonal or with bronchitis  . Complication of anesthesia    "forgets to breathe" when she wakes up  . Depression   . Diabetes type 2, controlled ([place])    fasting 100-120  . GERD (gastroesophageal reflux disease)   . Headache   . Heart murmur   . Hepatitis    A -- as teenager  . Hyperlipidemia   . Hypertension   . Hypothyroidism   . Mitral valve prolapse   . OSA on CPAP   . Pneumonia    hx of  . Vertigo    Past Surgical History:  Procedure Laterality Date  . ABDOMINAL HYSTERECTOMY    .  CHOLECYSTECTOMY    . FRACTURE SURGERY Right    leg  . GALLBLADDER SURGERY  1995  . JOINT REPLACEMENT Left    knee  . NASAL SINUS SURGERY  2005  . PARTIAL HYSTERECTOMY  1983  . THROAT SURGERY  2005  . TOTAL KNEE ARTHROPLASTY Right 09/12/2014   Procedure: TOTAL KNEE ARTHROPLASTY;  Surgeon: Meredith Pel, MD;  Location: [facility];  Service: Orthopedics;  Laterality: Right;   Current Medications: Current Meds  Medication Sig  . acetaminophen (TYLENOL) 500 MG tablet Take 500-1,000 mg by mouth every 6 (six) hours as needed for moderate pain.  Marland Kitchen apixaban (ELIQUIS) 5 MG TABS tablet Take 1 tablet (5 mg total) by mouth 2 (two) times daily.  Marland Kitchen buPROPion (WELLBUTRIN XL) 300 MG 24 hr tablet Take 1 tablet (300 mg total) by mouth daily.  . Cholecalciferol (VITAMIN D3) 1000 UNITS CAPS Take 1,000 Units by mouth daily.   . clonazePAM (KLONOPIN) 0.5 MG tablet Take 1 tablet (0.5 mg total) by mouth daily. (Patient taking differently: Take 0.5 mg by mouth 2 (two) times daily. )  . cycloSPORINE (RESTASIS) 0.05 % ophthalmic emulsion Place 1 drop into both eyes 2 (two) times daily.  . fluticasone (FLONASE) 50 MCG/ACT nasal spray Place 1 spray into both nostrils daily as needed for allergies.   Marland Kitchen levothyroxine (SYNTHROID, LEVOTHROID) 25 MCG  tablet Take 25 mcg by mouth daily.    Marland Kitchen losartan (COZAAR) 25 MG tablet Take 25 mg by mouth daily.   . metFORMIN (GLUCOPHAGE) 500 MG tablet Take 1 tablet (500 mg total) by mouth 2 (two) times daily with a meal.  . metFORMIN (GLUCOPHAGE-XR) 500 MG 24 hr tablet Take 500 mg by mouth 2 (two) times daily.   . metoprolol tartrate 75 MG TABS Take 75 mg by mouth 2 (two) times daily.  . montelukast (SINGULAIR) 10 MG tablet Take 10 mg by mouth daily.  Marland Kitchen omeprazole (PRILOSEC) 40 MG capsule Take 40 mg by mouth daily.    Marland Kitchen oxybutynin (DITROPAN-XL) 10 MG 24 hr tablet Take 10 mg by mouth daily.  . pravastatin (PRAVACHOL) 40 MG tablet Take 40 mg by mouth daily.  . traZODone (DESYREL) 100 MG  tablet Take 100 mg by mouth at bedtime.   Marland Kitchen venlafaxine (EFFEXOR) 50 MG tablet Take 2 tablets (100 mg total) by mouth daily.     Allergies:   Atorvastatin; Aciphex [rabeprazole sodium]; Coumadin [warfarin sodium]; Erythromycin; Macrodantin; Nsaids; Omeprazole-sodium bicarbonate [omeprazole-sodium bicarbonate]; Sulfa antibiotics; and Tape   Social History   Socioeconomic History  . Marital status: Married    Spouse name: Not on file  . Number of children: Not on file  . Years of education: Not on file  . Highest education level: Not on file  Occupational History  . Occupation: retired    Comment: Secondary school teacher  Social Needs  . Financial resource strain: Not on file  . Food insecurity:    Worry: Not on file    Inability: Not on file  . Transportation needs:    Medical: Not on file    Non-medical: Not on file  Tobacco Use  . Smoking status: Never Smoker  . Smokeless tobacco: Never Used  Substance and Sexual Activity  . Alcohol use: No    Comment: socially  . Drug use: No  . Sexual activity: Not on file  Lifestyle  . Physical activity:    Days per week: Not on file    Minutes per session: Not on file  . Stress: Not on file  Relationships  . Social connections:    Talks on phone: Not on file    Gets together: Not on file    Attends religious service: Not on file    Active member of club or organization: Not on file    Attends meetings of clubs or organizations: Not on file    Relationship status: Not on file  Other Topics Concern  . Not on file  Social History Narrative  . Not on file    Family History: The patient's family history includes Breast cancer in her maternal aunt; Cancer in her mother; Cancer - Colon in her mother.  ROS:   Please see the history of present illness.    All other systems reviewed and are negative.  EKGs/Labs/Other Studies Reviewed:    The following studies were reviewed today:  EKG:  EKG is ordered today.  The ekg ordered today  demonstrates atrial fibrillation with rapid ventricular response, 118 bpm, this was personally reviewed.  Recent Labs: 09/27/2018: ALT 12; TSH 5.227 09/28/2018: BUN 16; Creatinine, Ser 1.23; Hemoglobin 11.5; Platelets 277; Potassium 4.0; Sodium 139  Recent Lipid Panel No results found for: CHOL, TRIG, HDL, CHOLHDL, VLDL, LDLCALC, LDLDIRECT  Physical Exam:    VS:  BP 108/80   Pulse (!) 118   Ht 5\' 4"  (1.626 m)   Wt  177 lb 12.8 oz (80.6 kg)   SpO2 96%   BMI 30.52 kg/m     Wt Readings from Last 3 Encounters:  10/05/18 177 lb 12.8 oz (80.6 kg)  09/27/18 180 lb (81.6 kg)  09/01/18 183 lb 12.8 oz (83.4 kg)     GEN: Well nourished, well developed in no acute distress HEENT: Normal NECK: No JVD; No carotid bruits LYMPHATICS: No lymphadenopathy CARDIAC: iRRR, no murmurs, rubs, gallops RESPIRATORY: Trivial crackles at bases bilaterally, wheezing or rhonchi  ABDOMEN: Soft, non-tender, non-distended MUSCULOSKELETAL:  No edema; No deformity  SKIN: Warm and dry NEUROLOGIC:  Alert and oriented x 3 PSYCHIATRIC:  Normal affect   ASSESSMENT:    1. Atrial fibrillation with RVR (HCC)    PLAN:    In order of problems listed above:  Atrial fibrillation with persistent RVR -Worsening dizziness, no room to increase metoprolol she is hypotensive -CHA2DS2/VAS Stroke RiskScore is 4 (HTN, Age, DM, female). -Continue Eliquis 500 mg p.o. twice daily -Continue metoprolol 75 mg p.o. twice daily -Schedule outpatient TEE/cardioversion as soon as possible.  Hypertension -She is rather hypotensive.  CKD stage 3  Hyperlipidemia -On pravastatin 40 mg daily  OSA -Patient states that she is compliant with CPAP.  We discussed how this is important in the treatment of atrial fibrillation.  Hypothyroidism -TSH is mildly elevated at 5.227. Free T4 is normal -Continues on levothyroxine  Frequent falls -Worse over the last few years.  The patient is on oxybutynin for significant urinary  incontinence, which can be associated with dizziness and hypotension.  Atrial fibrillation with RVR may have contributed to lower blood pressure as well.  She also has a history of cerebral palsy and she feels like some of her weakness and falls is related to this.  Mitral valve prolapse -With moderate regurgitation.  I do not appreciate a murmur today. -We will follow this in our office  Follow-up with a PA in 2 weeks.  Medication Adjustments/Labs and Tests Ordered: Current medicines are reviewed at length with the patient today.  Concerns regarding medicines are outlined above.  Orders Placed This Encounter  Procedures  . EKG 12-Lead   No orders of the defined types were placed in this encounter.   There are no Patient Instructions on file for this visit.   Signed, Ena Dawley, MD  10/05/2018 9:21 AM    [place]

## 2018-10-06 ENCOUNTER — Emergency Department (HOSPITAL_COMMUNITY)
Admission: EM | Admit: 2018-10-06 | Discharge: 2018-10-06 | Disposition: A | Discharge status: Home / Self Care | Payer: Medicare Other | Attending: Emergency Medicine | Admitting: Emergency Medicine

## 2018-10-06 ENCOUNTER — Other Ambulatory Visit: Payer: Self-pay

## 2018-10-06 ENCOUNTER — Encounter (HOSPITAL_COMMUNITY): Payer: Self-pay | Admitting: *Deleted

## 2018-10-06 ENCOUNTER — Emergency Department (HOSPITAL_COMMUNITY): Payer: Medicare Other

## 2018-10-06 DIAGNOSIS — E039 Hypothyroidism, unspecified: Secondary | ICD-10-CM | POA: Insufficient documentation

## 2018-10-06 DIAGNOSIS — Y999 Unspecified external cause status: Secondary | ICD-10-CM | POA: Diagnosis not present

## 2018-10-06 DIAGNOSIS — E119 Type 2 diabetes mellitus without complications: Secondary | ICD-10-CM | POA: Diagnosis not present

## 2018-10-06 DIAGNOSIS — Z7901 Long term (current) use of anticoagulants: Secondary | ICD-10-CM | POA: Insufficient documentation

## 2018-10-06 DIAGNOSIS — W07XXXA Fall from chair, initial encounter: Secondary | ICD-10-CM | POA: Insufficient documentation

## 2018-10-06 DIAGNOSIS — J45909 Unspecified asthma, uncomplicated: Secondary | ICD-10-CM | POA: Diagnosis not present

## 2018-10-06 DIAGNOSIS — Z7984 Long term (current) use of oral hypoglycemic drugs: Secondary | ICD-10-CM | POA: Insufficient documentation

## 2018-10-06 DIAGNOSIS — R42 Dizziness and giddiness: Secondary | ICD-10-CM | POA: Diagnosis not present

## 2018-10-06 DIAGNOSIS — I10 Essential (primary) hypertension: Secondary | ICD-10-CM | POA: Insufficient documentation

## 2018-10-06 DIAGNOSIS — Z79899 Other long term (current) drug therapy: Secondary | ICD-10-CM | POA: Diagnosis not present

## 2018-10-06 DIAGNOSIS — Y92009 Unspecified place in unspecified non-institutional (private) residence as the place of occurrence of the external cause: Secondary | ICD-10-CM | POA: Diagnosis not present

## 2018-10-06 DIAGNOSIS — W19XXXA Unspecified fall, initial encounter: Secondary | ICD-10-CM

## 2018-10-06 LAB — CBC WITH DIFFERENTIAL/PLATELET
ABS IMMATURE GRANULOCYTES: 0.03 10*3/uL (ref 0.00–0.07)
BASOS ABS: 0.1 10*3/uL (ref 0.0–0.1)
Basophils Relative: 1 %
Eosinophils Absolute: 0.2 10*3/uL (ref 0.0–0.5)
Eosinophils Relative: 3 %
HCT: 36.4 % (ref 36.0–46.0)
HEMOGLOBIN: 11.4 g/dL — AB (ref 12.0–15.0)
Immature Granulocytes: 0 %
LYMPHS PCT: 29 %
Lymphs Abs: 2.2 10*3/uL (ref 0.7–4.0)
MCH: 27.7 pg (ref 26.0–34.0)
MCHC: 31.3 g/dL (ref 30.0–36.0)
MCV: 88.6 fL (ref 80.0–100.0)
Monocytes Absolute: 0.8 10*3/uL (ref 0.1–1.0)
Monocytes Relative: 10 %
NEUTROS ABS: 4.4 10*3/uL (ref 1.7–7.7)
NRBC: 0 % (ref 0.0–0.2)
Neutrophils Relative %: 57 %
Platelets: 304 10*3/uL (ref 150–400)
RBC: 4.11 MIL/uL (ref 3.87–5.11)
RDW: 13.6 % (ref 11.5–15.5)
WBC: 7.7 10*3/uL (ref 4.0–10.5)

## 2018-10-06 LAB — URINALYSIS, ROUTINE W REFLEX MICROSCOPIC
Bilirubin Urine: NEGATIVE
GLUCOSE, UA: NEGATIVE mg/dL
Hgb urine dipstick: NEGATIVE
Ketones, ur: NEGATIVE mg/dL
NITRITE: NEGATIVE
PROTEIN: NEGATIVE mg/dL
Specific Gravity, Urine: 1.009 (ref 1.005–1.030)
pH: 5 (ref 5.0–8.0)

## 2018-10-06 LAB — BASIC METABOLIC PANEL
Anion gap: 9 (ref 5–15)
BUN: 24 mg/dL — ABNORMAL HIGH (ref 8–23)
CALCIUM: 9 mg/dL (ref 8.9–10.3)
CO2: 22 mmol/L (ref 22–32)
CREATININE: 1.58 mg/dL — AB (ref 0.44–1.00)
Chloride: 104 mmol/L (ref 98–111)
GFR calc non Af Amer: 31 mL/min — ABNORMAL LOW (ref >60)
GFR, EST AFRICAN AMERICAN: 36 mL/min — AB (ref >60)
Glucose, Bld: 104 mg/dL — ABNORMAL HIGH (ref 70–99)
Potassium: 3.9 mmol/L (ref 3.5–5.1)
SODIUM: 135 mmol/L (ref 135–145)

## 2018-10-06 NOTE — ED Triage Notes (Signed)
Pt from home after falling at home. Pt reports she fell off the stool at home after losing her balance. Denies LOC. Recently started on Eliquis a week ago for new onset afib. Pt was complaining of neck pain, ccollar in place. Denies pain at present. EMS reported pt had been having dizziness all day

## 2018-10-06 NOTE — ED Provider Notes (Signed)
[facility] EMERGENCY DEPARTMENT Provider Note   CSN: [id number] Arrival date & time: 10/06/18  0036     History   Chief Complaint Chief Complaint  Patient presents with  . Fall    HPI Amanda [name] is a 72 y.o. female.  Patient presents to the emergency department for evaluation after a fall.  Patient reports that she was sitting on a stool and fell, hitting the back of her head on a TV cabinet.  No loss of consciousness.  She has been experiencing neck pain since the fall.  Denies back, extremity pain.  Patient not experiencing significant headache, but she was started on Eliquis a week ago for A. Fib.  Patient reports that she has been feeling dizzy today.  She reports "today was a dizzy day".  She reports that she frequently has this dizziness, and it comes and goes.     Past Medical History:  Diagnosis Date  . A-fib ([place])   . Anemia due to blood loss   . Anxiety   . Arthritis   . Asthma    seasonal or with bronchitis  . Complication of anesthesia    "forgets to breathe" when she wakes up  . Depression   . Diabetes type 2, controlled ([place])    fasting 100-120  . GERD (gastroesophageal reflux disease)   . Headache   . Heart murmur   . Hepatitis    A -- as teenager  . Hyperlipidemia   . Hypertension   . Hypothyroidism   . Mitral valve prolapse   . OSA on CPAP   . Pneumonia    hx of  . Vertigo     Patient Active Problem List   Diagnosis Date Noted  . Atrial fibrillation with RVR ([place]) 09/27/2018  . Weakness generalized 09/27/2018  . Poor compliance with CPAP treatment 01/27/2018  . Trochanteric bursitis of right hip 12/01/2017  . 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 Acute bronchitis due to Streptococcus 11/22/2015  . OSA on CPAP 11/22/2015  . Depression (emotion) 11/22/2015  . Abnormal CT scan   . Hematoma of left psoas region to anticoagulant therapy 10/11/2014  . Coagulopathy ([place]) 10/11/2014  . Psoas hematoma, left, secondary to  anticoagulant therapy 10/11/2014  . Acute hyponatremia 10/11/2014  . S/P total knee arthroplasty 09/24/2014  . Hypertension   . GERD (gastroesophageal reflux disease)   . Diabetes type 2, controlled ([place])   . Depression   . Hyperlipidemia   . 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 Cough 08/31/2013    Past Surgical History:  Procedure Laterality Date  . ABDOMINAL HYSTERECTOMY    . CHOLECYSTECTOMY    . FRACTURE SURGERY Right    leg  . GALLBLADDER SURGERY  1995  . JOINT REPLACEMENT Left    knee  . NASAL SINUS SURGERY  2005  . PARTIAL HYSTERECTOMY  1983  . THROAT SURGERY  2005  . TOTAL KNEE ARTHROPLASTY Right 09/12/2014   Procedure: TOTAL KNEE ARTHROPLASTY;  Surgeon: Meredith Pel, MD;  Location: [facility];  Service: Orthopedics;  Laterality: Right;     OB History   None      Home Medications    Prior to Admission medications   Medication Sig Start Date End Date Taking? Authorizing Provider  acetaminophen (TYLENOL) 500 MG tablet Take 1,000 mg by mouth 2 (two) times daily as needed for moderate pain.    Yes [provider]  apixaban (ELIQUIS) 5 MG TABS tablet Take 1 tablet (5 mg total)  by mouth 2 (two) times daily. 09/28/18  Yes Danford, Suann Larry, MD  ARTIFICIAL TEAR SOLUTION OP Place 1 drop into both eyes daily as needed (dry eyes).   Yes [provider]  buPROPion (WELLBUTRIN XL) 300 MG 24 hr tablet Take 1 tablet (300 mg total) by mouth daily. Patient taking differently: Take 300 mg by mouth at bedtime.  01/22/17  Yes Dohmeier, Asencion Partridge, MD  cetirizine (ZYRTEC) 10 MG tablet Take 10 mg by mouth daily as needed for allergies.   Yes [provider]  clonazePAM (KLONOPIN) 0.5 MG tablet Take 1 tablet (0.5 mg total) by mouth daily. Patient taking differently: Take 0.5 mg by mouth 2 (two) times daily.  01/22/17  Yes Dohmeier, Asencion Partridge, MD  cycloSPORINE (RESTASIS) 0.05 % ophthalmic emulsion Place 1 drop into both eyes 2 (two) times daily as needed (dry eyes).     Yes [provider]  fluticasone (FLONASE) 50 MCG/ACT nasal spray Place 1 spray into both nostrils daily as needed for allergies.  08/20/17  Yes [provider]  levothyroxine (SYNTHROID, LEVOTHROID) 25 MCG tablet Take 25 mcg by mouth daily before breakfast.    Yes [provider]  losartan (COZAAR) 25 MG tablet Take 25 mg by mouth daily.    Yes [provider]  metFORMIN (GLUCOPHAGE-XR) 500 MG 24 hr tablet Take 500 mg by mouth 2 (two) times daily.   Yes [provider]  metoprolol tartrate (LOPRESSOR) 50 MG tablet Take 75 mg by mouth 2 (two) times daily.   Yes [provider]  montelukast (SINGULAIR) 10 MG tablet Take 10 mg by mouth daily as needed (allergies).  12/28/17  Yes [provider]  omeprazole (PRILOSEC) 20 MG capsule Take 20 mg by mouth every evening.   Yes [provider]  oxybutynin (DITROPAN-XL) 10 MG 24 hr tablet Take 10 mg by mouth every evening.  06/17/17  Yes [provider]  pravastatin (PRAVACHOL) 40 MG tablet Take 40 mg by mouth at bedtime.    Yes [provider]  traZODone (DESYREL) 100 MG tablet Take 100-200 mg by mouth at bedtime.    Yes [provider]  venlafaxine (EFFEXOR) 50 MG tablet Take 2 tablets (100 mg total) by mouth daily. 11/22/15  Yes Dohmeier, Asencion Partridge, MD  metoprolol tartrate 75 MG TABS Take 75 mg by mouth 2 (two) times daily. Patient not taking: Reported on 10/05/2018 09/28/18   Edwin Dada, MD    Family History Family History  Problem Relation Age of Onset  . Cancer Mother        panceratic  . Cancer - Colon Mother   . Breast cancer Maternal Aunt     Social History Social History   Tobacco Use  . Smoking status: Never Smoker  . Smokeless tobacco: Never Used  Substance Use Topics  . Alcohol use: No    Comment: socially  . Drug use: No     Allergies   Atorvastatin; Aciphex [rabeprazole sodium]; Coumadin [warfarin sodium]; Erythromycin;  Lactose intolerance (gi); Macrodantin; Nsaids; Sulfa antibiotics; and Tape   Review of Systems Review of Systems  Musculoskeletal: Positive for neck pain.  Neurological: Positive for dizziness.  All other systems reviewed and are negative.    Physical Exam Updated Vital Signs BP 115/77   Pulse 93   Temp 97.7 F (36.5 C) (Oral)   Resp (!) 23   SpO2 90%   Physical Exam  Constitutional: She is oriented to person, place, and time. She appears well-developed and  well-nourished. No distress.  HENT:  Head: Normocephalic and atraumatic.  Right Ear: Hearing normal.  Left Ear: Hearing normal.  Nose: Nose normal.  Mouth/Throat: Oropharynx is clear and moist and mucous membranes are normal.  Eyes: Pupils are equal, round, and reactive to light. Conjunctivae and EOM are normal.  Neck: Normal range of motion. Neck supple.  Cardiovascular: Regular rhythm, S1 normal and S2 normal. Exam reveals no gallop and no friction rub.  No murmur heard. Pulmonary/Chest: Effort normal and breath sounds normal. No respiratory distress. She exhibits no tenderness.  Abdominal: Soft. Normal appearance and bowel sounds are normal. There is no hepatosplenomegaly. There is no tenderness. There is no rebound, no guarding, no tenderness at McBurney's point and negative Murphy's sign. No hernia.  Musculoskeletal: Normal range of motion.  Neurological: She is alert and oriented to person, place, and time. She has normal strength. No cranial nerve deficit or sensory deficit. Coordination normal. GCS eye subscore is 4. GCS verbal subscore is 5. GCS motor subscore is 6.  Skin: Skin is warm, dry and intact. No rash noted. No cyanosis.  Psychiatric: She has a normal mood and affect. Her speech is normal and behavior is normal. Thought content normal.  Nursing note and vitals reviewed.    ED Treatments / Results  Labs (all labs ordered are listed, but only abnormal results are displayed) Labs Reviewed  CBC WITH  DIFFERENTIAL/PLATELET - Abnormal; Notable for the following components:      Result Value   Hemoglobin 11.4 (*)    All other components within normal limits  BASIC METABOLIC PANEL - Abnormal; Notable for the following components:   Glucose, Bld 104 (*)    BUN 24 (*)    Creatinine, Ser 1.58 (*)    GFR calc non Af Amer 31 (*)    GFR calc Af Amer 36 (*)    All other components within normal limits  URINALYSIS, ROUTINE W REFLEX MICROSCOPIC - Abnormal; Notable for the following components:   APPearance HAZY (*)    Leukocytes, UA MODERATE (*)    Bacteria, UA MANY (*)    All other components within normal limits    EKG None  Radiology Ct Head Wo Contrast  Result Date: 10/06/2018 CLINICAL DATA:  Pain after fall.  Patient on blood thinner. EXAM: CT HEAD WITHOUT CONTRAST CT CERVICAL SPINE WITHOUT CONTRAST TECHNIQUE: Multidetector CT imaging of the head and cervical spine was performed following the standard protocol without intravenous contrast. Multiplanar CT image reconstructions of the cervical spine were also generated. COMPARISON:  None. FINDINGS: CT HEAD FINDINGS Brain: No subdural, epidural, or subarachnoid hemorrhage. Ventricles and sulci are prominent. Mild white matter changes. No acute cortical ischemia or infarct. Cerebellum, brainstem, and basal cisterns are normal. No mass effect or midline shift. No acute cortical ischemia or infarct. Vascular: Calcified atherosclerosis in the intracranial carotids. Skull: Normal. Negative for fracture or focal lesion. Sinuses/Orbits: No acute finding. Other: None. CT CERVICAL SPINE FINDINGS Alignment: Normal. Skull base and vertebrae: No acute fracture. No primary bone lesion or focal pathologic process. Soft tissues and spinal canal: There is a mass in the region of the right thyroid lobe measuring 2.7 cm. Soft tissues otherwise unremarkable. Disc levels:  Multilevel degenerative changes. Upper chest: Negative. Other: No other abnormalities.  IMPRESSION: 1. No acute intracranial abnormalities. Chronic white matter changes. 2. 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 Recommend ultrasound for further evaluation. 3. No fracture or traumatic malalignment in the cervical spine. Electronically Signed   By: Shanon Brow  Jimmye Norman III M.D   On: 10/06/2018 01:52   Ct Cervical Spine Wo Contrast  Result Date: 10/06/2018 CLINICAL DATA:  Pain after fall.  Patient on blood thinner. EXAM: CT HEAD WITHOUT CONTRAST CT CERVICAL SPINE WITHOUT CONTRAST TECHNIQUE: Multidetector CT imaging of the head and cervical spine was performed following the standard protocol without intravenous contrast. Multiplanar CT image reconstructions of the cervical spine were also generated. COMPARISON:  None. FINDINGS: CT HEAD FINDINGS Brain: No subdural, epidural, or subarachnoid hemorrhage. Ventricles and sulci are prominent. Mild white matter changes. No acute cortical ischemia or infarct. Cerebellum, brainstem, and basal cisterns are normal. No mass effect or midline shift. No acute cortical ischemia or infarct. Vascular: Calcified atherosclerosis in the intracranial carotids. Skull: Normal. Negative for fracture or focal lesion. Sinuses/Orbits: No acute finding. Other: None. CT CERVICAL SPINE FINDINGS Alignment: Normal. Skull base and vertebrae: No acute fracture. No primary bone lesion or focal pathologic process. Soft tissues and spinal canal: There is a mass in the region of the right thyroid lobe measuring 2.7 cm. Soft tissues otherwise unremarkable. Disc levels:  Multilevel degenerative changes. Upper chest: Negative. Other: No other abnormalities. IMPRESSION: 1. No acute intracranial abnormalities. Chronic white matter changes. 2. 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 Recommend ultrasound for further evaluation. 3. No fracture or traumatic malalignment in the cervical spine. Electronically Signed   By: Dorise Bullion III M.D   On: 10/06/2018 01:52    Procedures Procedures (including critical  care time)  Medications Ordered in ED Medications - No data to display   Initial Impression / Assessment and Plan / ED Course  I have reviewed the triage vital signs and the nursing notes.  Pertinent labs & imaging results that were available during my care of the patient were reviewed by me and considered in my medical decision making (see chart for details).     Patient presents to the emergency department for evaluation after a fall.  Patient reports a history of intermittent dizziness that has been ongoing for some time.  She had a dizzy spell tonight that caused her to fall off of a stool, hitting her head.  She was just started on Eliquis for atrial fibrillation.  She does not have a headache and no focal neurologic deficit.  Because of the anticoagulation, underwent CT head.  She did have neck pain after the fall which has now resolved.  CT cervical spine also performed.  CTs are unremarkable.  CBC basic metabolic panel did not show any acute abnormality, no evidence of urinary tract infection.  Patient comfortable at this time, will be discharged and is to follow-up with primary doctor if dizziness continues.  Final Clinical Impressions(s) / ED Diagnoses   Final diagnoses:  Fall, initial encounter  Dizziness    ED Discharge Orders    None       , Gwenyth Allegra, MD 10/06/18 [phone]

## 2018-10-06 NOTE — ED Notes (Signed)
Patient verbalizes understanding of discharge instructions. Opportunity for questioning and answers were provided. Armband removed by staff, pt discharged from ED in wheelchair.  

## 2018-10-11 ENCOUNTER — Ambulatory Visit (HOSPITAL_BASED_OUTPATIENT_CLINIC_OR_DEPARTMENT_OTHER)
Admission: RE | Admit: 2018-10-11 | Discharge: 2018-10-11 | Disposition: A | Discharge status: Home / Self Care | Payer: Medicare Other | Source: Ambulatory Visit | Attending: Cardiology | Admitting: Cardiology

## 2018-10-11 ENCOUNTER — Encounter (HOSPITAL_COMMUNITY): Payer: Self-pay | Admitting: *Deleted

## 2018-10-11 ENCOUNTER — Ambulatory Visit (HOSPITAL_COMMUNITY): Payer: Medicare Other | Admitting: Anesthesiology

## 2018-10-11 ENCOUNTER — Ambulatory Visit (HOSPITAL_COMMUNITY)
Admission: RE | Admit: 2018-10-11 | Discharge: 2018-10-11 | Disposition: A | Discharge status: Home / Self Care | Payer: Medicare Other | Source: Ambulatory Visit | Attending: Cardiology | Admitting: Cardiology

## 2018-10-11 ENCOUNTER — Encounter (HOSPITAL_COMMUNITY)
Admission: RE | Disposition: A | Discharge status: Home / Self Care | Payer: Self-pay | Source: Ambulatory Visit | Attending: Cardiology

## 2018-10-11 DIAGNOSIS — J45909 Unspecified asthma, uncomplicated: Secondary | ICD-10-CM | POA: Diagnosis not present

## 2018-10-11 DIAGNOSIS — G809 Cerebral palsy, unspecified: Secondary | ICD-10-CM | POA: Diagnosis not present

## 2018-10-11 DIAGNOSIS — I341 Nonrheumatic mitral (valve) prolapse: Secondary | ICD-10-CM | POA: Diagnosis not present

## 2018-10-11 DIAGNOSIS — I129 Hypertensive chronic kidney disease with stage 1 through stage 4 chronic kidney disease, or unspecified chronic kidney disease: Secondary | ICD-10-CM | POA: Insufficient documentation

## 2018-10-11 DIAGNOSIS — Z882 Allergy status to sulfonamides status: Secondary | ICD-10-CM | POA: Insufficient documentation

## 2018-10-11 DIAGNOSIS — I351 Nonrheumatic aortic (valve) insufficiency: Secondary | ICD-10-CM

## 2018-10-11 DIAGNOSIS — R296 Repeated falls: Secondary | ICD-10-CM | POA: Insufficient documentation

## 2018-10-11 DIAGNOSIS — E039 Hypothyroidism, unspecified: Secondary | ICD-10-CM | POA: Diagnosis not present

## 2018-10-11 DIAGNOSIS — F419 Anxiety disorder, unspecified: Secondary | ICD-10-CM | POA: Insufficient documentation

## 2018-10-11 DIAGNOSIS — I4819 Other persistent atrial fibrillation: Secondary | ICD-10-CM | POA: Diagnosis not present

## 2018-10-11 DIAGNOSIS — Z7901 Long term (current) use of anticoagulants: Secondary | ICD-10-CM | POA: Diagnosis not present

## 2018-10-11 DIAGNOSIS — K219 Gastro-esophageal reflux disease without esophagitis: Secondary | ICD-10-CM | POA: Insufficient documentation

## 2018-10-11 DIAGNOSIS — Z7984 Long term (current) use of oral hypoglycemic drugs: Secondary | ICD-10-CM | POA: Insufficient documentation

## 2018-10-11 DIAGNOSIS — G4733 Obstructive sleep apnea (adult) (pediatric): Secondary | ICD-10-CM | POA: Insufficient documentation

## 2018-10-11 DIAGNOSIS — M1990 Unspecified osteoarthritis, unspecified site: Secondary | ICD-10-CM | POA: Diagnosis not present

## 2018-10-11 DIAGNOSIS — E785 Hyperlipidemia, unspecified: Secondary | ICD-10-CM | POA: Insufficient documentation

## 2018-10-11 DIAGNOSIS — I340 Nonrheumatic mitral (valve) insufficiency: Secondary | ICD-10-CM

## 2018-10-11 DIAGNOSIS — I959 Hypotension, unspecified: Secondary | ICD-10-CM | POA: Insufficient documentation

## 2018-10-11 DIAGNOSIS — E1122 Type 2 diabetes mellitus with diabetic chronic kidney disease: Secondary | ICD-10-CM | POA: Insufficient documentation

## 2018-10-11 DIAGNOSIS — Z6830 Body mass index (BMI) 30.0-30.9, adult: Secondary | ICD-10-CM | POA: Diagnosis not present

## 2018-10-11 DIAGNOSIS — I4891 Unspecified atrial fibrillation: Secondary | ICD-10-CM

## 2018-10-11 DIAGNOSIS — F329 Major depressive disorder, single episode, unspecified: Secondary | ICD-10-CM | POA: Diagnosis not present

## 2018-10-11 DIAGNOSIS — E669 Obesity, unspecified: Secondary | ICD-10-CM | POA: Insufficient documentation

## 2018-10-11 DIAGNOSIS — N183 Chronic kidney disease, stage 3 (moderate): Secondary | ICD-10-CM | POA: Diagnosis not present

## 2018-10-11 HISTORY — PX: CARDIOVERSION: SHX1299

## 2018-10-11 HISTORY — PX: TEE WITHOUT CARDIOVERSION: SHX5443

## 2018-10-11 LAB — GLUCOSE, CAPILLARY: Glucose-Capillary: 117 mg/dL — ABNORMAL HIGH (ref 70–99)

## 2018-10-11 SURGERY — ECHOCARDIOGRAM, TRANSESOPHAGEAL
Anesthesia: General

## 2018-10-11 MED ORDER — PROPOFOL 10 MG/ML IV BOLUS
INTRAVENOUS | Status: DC | PRN
  Administered 2018-10-11 (×2): 20 mg via INTRAVENOUS

## 2018-10-11 MED ORDER — LACTATED RINGERS IV SOLN
INTRAVENOUS | Status: DC | PRN
  Administered 2018-10-11: 09:00:00 via INTRAVENOUS

## 2018-10-11 MED ORDER — SODIUM CHLORIDE 0.9 % IV SOLN: INTRAVENOUS | Status: DC

## 2018-10-11 MED ORDER — PROPOFOL 500 MG/50ML IV EMUL
INTRAVENOUS | Status: DC | PRN
  Administered 2018-10-11: 100 ug/kg/min via INTRAVENOUS

## 2018-10-11 MED ORDER — LIDOCAINE HCL (CARDIAC) PF 100 MG/5ML IV SOSY
PREFILLED_SYRINGE | INTRAVENOUS | Status: DC | PRN
  Administered 2018-10-11: 40 mg via INTRAVENOUS

## 2018-10-11 MED ORDER — ONDANSETRON HCL 4 MG/2ML IJ SOLN
INTRAMUSCULAR | Status: DC | PRN
  Administered 2018-10-11: 4 mg via INTRAVENOUS

## 2018-10-11 MED ORDER — BUTAMBEN-TETRACAINE-BENZOCAINE 2-2-14 % EX AERO
INHALATION_SPRAY | CUTANEOUS | Status: DC | PRN
  Administered 2018-10-11: 1 via TOPICAL

## 2018-10-11 NOTE — Discharge Instructions (Signed)
Monitored Anesthesia Care Anesthesia is a term that refers to techniques, procedures, and medicines that help a person stay safe and comfortable during a medical procedure. Monitored anesthesia care, or sedation, is one type of anesthesia. Your anesthesia specialist may recommend sedation if you will be having a procedure that does not require you to be unconscious, such as:  Cataract surgery.  A dental procedure.  A biopsy.  A colonoscopy.  During the procedure, you may receive a medicine to help you relax (sedative). There are three levels of sedation:  Mild sedation. At this level, you may feel awake and relaxed. You will be able to follow directions.  Moderate sedation. At this level, you will be sleepy. You may not remember the procedure.  Deep sedation. At this level, you will be asleep. You will not remember the procedure.  The more medicine you are given, the deeper your level of sedation will be. Depending on how you respond to the procedure, the anesthesia specialist may change your level of sedation or the type of anesthesia to fit your needs. An anesthesia specialist will monitor you closely during the procedure. Let your health care provider know about:  Any allergies you have.  All medicines you are taking, including vitamins, herbs, eye drops, creams, and over-the-counter medicines.  Any use of steroids (by mouth or as a cream).  Any problems you or family members have had with sedatives and anesthetic medicines.  Any blood disorders you have.  Any surgeries you have had.  Any medical conditions you have, such as sleep apnea.  Whether you are pregnant or may be pregnant.  Any use of cigarettes, alcohol, or street drugs. What are the risks? Generally, this is a safe procedure. However, problems may occur, including:  Getting too much medicine (oversedation).  Nausea.  Allergic reaction to medicines.  Trouble breathing. If this happens, a breathing tube  may be used to help with breathing. It will be removed when you are awake and breathing on your own.  Heart trouble.  Lung trouble.  Before the procedure Staying hydrated Follow instructions from your health care provider about hydration, which may include:  Up to 2 hours before the procedure - you may continue to drink clear liquids, such as water, clear fruit juice, black coffee, and plain tea.  Eating and drinking restrictions Follow instructions from your health care provider about eating and drinking, which may include:  8 hours before the procedure - stop eating heavy meals or foods such as meat, fried foods, or fatty foods.  6 hours before the procedure - stop eating light meals or foods, such as toast or cereal.  6 hours before the procedure - stop drinking milk or drinks that contain milk.  2 hours before the procedure - stop drinking clear liquids.  Medicines Ask your health care provider about:  Changing or stopping your regular medicines. This is especially important if you are taking diabetes medicines or blood thinners.  Taking medicines such as aspirin and ibuprofen. These medicines can thin your blood. Do not take these medicines before your procedure if your health care provider instructs you not to.  Tests and exams  You will have a physical exam.  You may have blood tests done to show: ? How well your kidneys and liver are working. ? How well your blood can clot.  General instructions  Plan to have someone take you home from the hospital or clinic.  If you will be going home right after the  procedure, plan to have someone with you for 24 hours.  What happens during the procedure?  Your blood pressure, heart rate, breathing, level of pain and overall condition will be monitored.  An IV tube will be inserted into one of your veins.  Your anesthesia specialist will give you medicines as needed to keep you comfortable during the procedure. This may  mean changing the level of sedation.  The procedure will be performed. After the procedure  Your blood pressure, heart rate, breathing rate, and blood oxygen level will be monitored until the medicines you were given have worn off.  Do not drive for 24 hours if you received a sedative.  You may: ? Feel sleepy, clumsy, or nauseous. ? Feel forgetful about what happened after the procedure. ? Have a sore throat if you had a breathing tube during the procedure. ? Vomit. This information is not intended to replace advice given to you by your health care provider. Make sure you discuss any questions you have with your health care provider. Document Released: 07/30/2005 Document Revised: 04/11/2016 Document Reviewed: 02/24/2016 Elsevier Interactive Patient Education  2018 [employer]. Transesophageal Echocardiogram Transesophageal echocardiography (TEE) is a special type of test that produces images of the heart by using sound waves (echocardiogram). This type of echocardiography can obtain better images of the heart than standard echocardiography. TEE is done by passing a flexible tube down the esophagus. The heart is located in front of the esophagus. Because the heart and esophagus are close to one another, your health care provider can take very clear, detailed pictures of the heart via ultrasound waves. TEE may be done:  If your health care provider needs more information based on standard echocardiography findings.  If you had a stroke. This might have happened because a clot formed in your heart. TEE can visualize different areas of the heart and check for clots.  To check valve anatomy and function.  To check for infection on the inside of your heart (endocarditis).  To evaluate the dividing wall (septum) of the heart and presence of a hole that did not close after birth (patent foramen ovale or atrial septal defect).  To help diagnose a tear in the wall of the aorta (aortic  dissection).  During cardiac valve surgery. This allows the surgeon to assess the valve repair before closing the chest.  During a variety of other cardiac procedures to guide positioning of catheters.  Sometimes before a cardioversion, which is a shock to convert heart rhythm back to normal.  Tell a health care provider about:  Any allergies you have.  All medicines you are taking, including vitamins, herbs, eye drops, creams, and over-the-counter medicines.  Any problems you or family members have had with anesthetic medicines.  Any blood disorders you have.  Any surgeries you have had.  Any medical conditions you have.  Swallowing difficulties.  An esophageal obstruction. What are the risks? Generally, TEE is a safe procedure. However, as with any procedure, complications can occur. Possible complications include an esophageal tear (rupture). What happens before the procedure?  Do not eat or drink for 6 hours before the procedure or as directed by your health care provider.  Arrange for someone to drive you home after the procedure. Do not drive yourself home. During the procedure, you will be given medicines that can continue to make you feel drowsy and can impair your reflexes.  An IV access tube will be started in the arm. What happens during the procedure?  A medicine to help you relax (sedative) will be given through the IV access tube.  A medicine may be sprayed or gargled to numb the back of the throat.  Your blood pressure, heart rate, and breathing (vital signs) will be monitored during the procedure.  The TEE probe is a long, flexible tube. The tip of the probe is placed into the back of the mouth, and you will be asked to swallow. This helps to pass the tip of the probe into the esophagus. Once the tip of the probe is in the correct area, your health care provider can take pictures of the heart.  TEE is usually not a painful procedure. You may feel the probe  press against the back of the throat. The probe does not enter the trachea and does not affect your breathing. What happens after the procedure?  You will be in bed, resting, until you have fully returned to consciousness.  When you first awaken, your throat may feel slightly sore and will probably still feel numb. This will improve slowly over time.  You will not be allowed to eat or drink until it is clear that the numbness has improved.  Once you have been able to drink, urinate, and sit on the edge of the bed without feeling sick to your stomach (nausea) or dizzy, you may be cleared to go home.  You should have a friend or family member with you for the next 24 hours after your procedure. This information is not intended to replace advice given to you by your health care provider. Make sure you discuss any questions you have with your health care provider. Document Released: 01/24/2003 Document Revised: 04/10/2016 Document Reviewed: 05/05/2013 Elsevier Interactive Patient Education  2018 [employer]. Hospital doctor cardioversion is the delivery of a jolt of electricity to restore a normal rhythm to the heart. A rhythm that is too fast or is not regular keeps the heart from pumping well. In this procedure, sticky patches or metal paddles are placed on the chest to deliver electricity to the heart from a device. This procedure may be done in an emergency if:  There is low or no blood pressure as a result of the heart rhythm.  Normal rhythm must be restored as fast as possible to protect the brain and heart from further damage.  It may save a life.  This procedure may also be done for irregular or fast heart rhythms that are not immediately life-threatening. Tell a health care provider about:  Any allergies you have.  All medicines you are taking, including vitamins, herbs, eye drops, creams, and over-the-counter medicines.  Any problems you or family members  have had with anesthetic medicines.  Any blood disorders you have.  Any surgeries you have had.  Any medical conditions you have.  Whether you are pregnant or may be pregnant. What are the risks? Generally, this is a safe procedure. However, problems may occur, including:  Allergic reactions to medicines.  A blood clot that breaks free and travels to other parts of your body.  The possible return of an abnormal heart rhythm within hours or days after the procedure.  Your heart stopping (cardiac arrest). This is rare.  What happens before the procedure? Medicines  Your health care provider may have you start taking: ? Blood-thinning medicines (anticoagulants) so your blood does not clot as easily. ? Medicines may be given to help stabilize your heart rate and rhythm.  Ask your health care provider  about changing or stopping your regular medicines. This is especially important if you are taking diabetes medicines or blood thinners. General instructions  Plan to have someone take you home from the hospital or clinic.  If you will be going home right after the procedure, plan to have someone with you for 24 hours.  Follow instructions from your health care provider about eating or drinking restrictions. What happens during the procedure?  To lower your risk of infection: ? Your health care team will wash or sanitize their hands. ? Your skin will be washed with soap.  An IV tube will be inserted into one of your veins.  You will be given a medicine to help you relax (sedative).  Sticky patches (electrodes) or metal paddles may be placed on your chest.  An electrical shock will be delivered. The procedure may vary among health care providers and hospitals. What happens after the procedure?  Your blood pressure, heart rate, breathing rate, and blood oxygen level will be monitored until the medicines you were given have worn off.  Do not drive for 24 hours if you were  given a sedative.  Your heart rhythm will be watched to make sure it does not change. This information is not intended to replace advice given to you by your health care provider. Make sure you discuss any questions you have with your health care provider. Document Released: 10/24/2002 Document Revised: 07/02/2016 Document Reviewed: 05/09/2016 Elsevier Interactive Patient Education  2017 [employer].

## 2018-10-11 NOTE — Interval H&P Note (Signed)
History and Physical Interval Note:  10/11/2018 8:39 AM  Amanda [name]  has presented today for surgery, with the diagnosis of atrial fibrillation with rapid ventricular rate  The various methods of treatment have been discussed with the patient and family. After consideration of risks, benefits and other options for treatment, the patient has consented to  Procedure(s): TRANSESOPHAGEAL ECHOCARDIOGRAM (TEE) (N/A) CARDIOVERSION (N/A) as a surgical intervention .  The patient's history has been reviewed, patient examined, no change in status, stable for surgery.  I have reviewed the patient's chart and labs.  Questions were answered to the patient's satisfaction.     Ena Dawley

## 2018-10-11 NOTE — CV Procedure (Signed)
   Transesophageal Echocardiogram Note  Amanda [name] [id number] Jul 01, 1946  Procedure: Transesophageal Echocardiogram Indications: atrial fibrillation  Procedure Details Consent: Obtained Time Out: Verified patient identification, verified procedure, site/side was marked, verified correct patient position, special equipment/implants available, Radiology Safety Procedures followed,  medications/allergies/relevent history reviewed, required imaging and test results available.  Performed  Medications: During this procedure the patient is administered iv Propofol by anesthesia staff to achieve and maintain moderate conscious sedation.  The patient's heart rate, blood pressure, and oxygen saturation are monitored continuously during the procedure. The period of conscious sedation is 30 minutes, of which I was present face-to-face 100% of this time.  No intracardiac source of embolism was identified.   Complications: No apparent complications Patient did tolerate procedure well.  Ena Dawley, MD, [facility] 10/11/2018, 11:11 AM     Cardioversion Note  Amanda [name] [id number] December 20, 1945  Procedure: DC Cardioversion Indications: atrial fibrillation  Procedure Details Consent: Obtained Time Out: Verified patient identification, verified procedure, site/side was marked, verified correct patient position, special equipment/implants available, Radiology Safety Procedures followed,  medications/allergies/relevent history reviewed, required imaging and test results available.  Performed  The patient has been on adequate anticoagulation.  The patient received IV propofol for sedation.  Synchronous cardioversion was performed at 120 joules.  The cardioversion was successful.  Complications: No apparent complications Patient did tolerate procedure well.  Ena Dawley, MD, [facility] 10/11/2018, 11:11 AM

## 2018-10-11 NOTE — Transfer of Care (Signed)
Immediate Anesthesia Transfer of Care Note  Patient: Amanda [name]  Procedure(s) Performed: TRANSESOPHAGEAL ECHOCARDIOGRAM (TEE) (N/A ) CARDIOVERSION (N/A )  Patient Location: Endoscopy Unit  Anesthesia Type:General  Level of Consciousness: awake, alert  and sedated  Airway & Oxygen Therapy: Patient connected to face mask oxygen  Post-op Assessment: Post -op Vital signs reviewed and stable  Post vital signs: stable  Last Vitals:  Vitals Value Taken Time  BP    Temp    Pulse    Resp    SpO2      Last Pain:  Vitals:   10/11/18 0717  TempSrc: Oral  PainSc: 0-No pain         Complications: No apparent anesthesia complications

## 2018-10-11 NOTE — Progress Notes (Signed)
  Echocardiogram Echocardiogram Transesophageal has been performed.  Marybelle Killings 10/11/2018, 10:37 AM

## 2018-10-11 NOTE — Anesthesia Preprocedure Evaluation (Addendum)
Anesthesia Evaluation  Patient identified by MRN, date of birth, ID band Patient awake    Reviewed: Allergy & Precautions, NPO status , Patient's Chart, lab work & pertinent test results, reviewed documented beta blocker date and time   Airway Mallampati: III  TM Distance: >3 FB Neck ROM: Full    Dental no notable dental hx. (+) Dental Advisory Given, Teeth Intact   Pulmonary asthma , sleep apnea and Continuous Positive Airway Pressure Ventilation ,    Pulmonary exam normal breath sounds clear to auscultation       Cardiovascular hypertension, Pt. on medications and Pt. on home beta blockers + dysrhythmias Atrial Fibrillation + Valvular Problems/Murmurs MVP  Rhythm:Irregular Rate:Tachycardia  ECG: a-fib, rate 87  ECHO:  The patient was in atrial fibrillation. Normal LV size with EF 55-60%. Mildly dilated RV with mildly decreased systolic function. Mild posterior mitral leaflet prolapse with moderate mitral regurgitation. Biatrial enlargement. Mild pulmonary hypertension.    Neuro/Psych  Headaches, PSYCHIATRIC DISORDERS Anxiety Depression    GI/Hepatic Neg liver ROS, GERD  Medicated and Controlled,  Endo/Other  diabetes, Oral Hypoglycemic AgentsHypothyroidism   Renal/GU Renal disease     Musculoskeletal   Abdominal (+) + obese,   Peds  Hematology HLD   Anesthesia Other Findings Atrial fibrillation with rapid ventricular rate  Reproductive/Obstetrics                           Anesthesia Physical Anesthesia Plan  ASA: IV  Anesthesia Plan: General   Post-op Pain Management:    Induction: Intravenous  PONV Risk Score and Plan: 3 and Propofol infusion and Treatment may vary due to age or medical condition  Airway Management Planned: Nasal Cannula  Additional Equipment:   Intra-op Plan:   Post-operative Plan:   Informed Consent: I have reviewed the patients History and Physical,  chart, labs and discussed the procedure including the risks, benefits and alternatives for the proposed anesthesia with the patient or authorized representative who has indicated his/her understanding and acceptance.   Dental advisory given  Plan Discussed with: CRNA  Anesthesia Plan Comments:        Anesthesia Quick Evaluation

## 2018-10-11 NOTE — Anesthesia Postprocedure Evaluation (Signed)
Anesthesia Post Note  Patient: Amanda [name]  Procedure(s) Performed: TRANSESOPHAGEAL ECHOCARDIOGRAM (TEE) (N/A ) CARDIOVERSION (N/A )     Patient location during evaluation: PACU Anesthesia Type: General Level of consciousness: awake Pain management: pain level controlled Vital Signs Assessment: post-procedure vital signs reviewed and stable Respiratory status: spontaneous breathing, nonlabored ventilation, respiratory function stable and patient connected to nasal cannula oxygen Cardiovascular status: blood pressure returned to baseline and stable Postop Assessment: no apparent nausea or vomiting Anesthetic complications: no    Last Vitals:  Vitals:   10/11/18 1030 10/11/18 1039  BP:  114/85  Pulse: (!) 56 (!) 59  Resp: (!) 24 15  Temp:    SpO2: 97% 95%    Last Pain:  Vitals:   10/11/18 1039  TempSrc:   PainSc: 0-No pain                 [facility]

## 2018-10-12 ENCOUNTER — Encounter (HOSPITAL_COMMUNITY): Payer: Self-pay | Admitting: Cardiology

## 2018-10-20 ENCOUNTER — Encounter: Payer: Self-pay | Admitting: Cardiology

## 2018-10-20 ENCOUNTER — Telehealth: Payer: Self-pay | Admitting: Cardiology

## 2018-10-20 ENCOUNTER — Ambulatory Visit: Payer: Medicare Other | Admitting: Cardiology

## 2018-10-20 VITALS — BP 130/58 | HR 109 | Ht 64.0 in | Wt 177.0 lb

## 2018-10-20 DIAGNOSIS — I480 Paroxysmal atrial fibrillation: Secondary | ICD-10-CM

## 2018-10-20 DIAGNOSIS — R42 Dizziness and giddiness: Secondary | ICD-10-CM

## 2018-10-20 DIAGNOSIS — I10 Essential (primary) hypertension: Secondary | ICD-10-CM | POA: Diagnosis not present

## 2018-10-20 DIAGNOSIS — I4892 Unspecified atrial flutter: Secondary | ICD-10-CM | POA: Diagnosis not present

## 2018-10-20 MED ORDER — METOPROLOL TARTRATE 100 MG PO TABS: 100.0000 mg | ORAL_TABLET | Freq: Two times a day (BID) | ORAL | 2 refills | Status: DC

## 2018-10-20 NOTE — Patient Instructions (Signed)
Medication Instructions:   STOP TAKING LOSARTAN NOW  INCREASE YOUR METOPROLOL TARTRATE 100 MG BY MOUTH TWICE DAILY   If you need a refill on your cardiac medications before your next appointment, please call your pharmacy.      You have been referred URGENTLY TO SEE AN ELECTROPHYSIOLOGIST IN OUR OFFICE PER DR NELSON--PER DR NELSON THIS NEEDS TO BE SCHEDULED ASAP     Follow-Up:  URGENT REFERRAL TO OUR EP PROVIDERS  3 MONTHS WITH DR Meda Coffee

## 2018-10-20 NOTE — Telephone Encounter (Signed)
Dr Meda Coffee aware that the office note with the pt today needs to be sent to her PCP office.  Per Dr Meda Coffee, after she closes the note, Epic will automatically CC and route/fax the pts note from our office today, to her PCP at [facility]. Dr Meda Coffee is addressing this.

## 2018-10-20 NOTE — Progress Notes (Signed)
Cardiology Office Note:    Date:  10/20/2018   ID:  Amanda [name], DOB Aug 12, 1946, MRN [id number]  PCP:  Aretta Nip, MD  Cardiologist:  Ena Dawley, MD  Electrophysiologist:  None   Referring MD: Aretta Nip, MD   Chief complaint: Palpitations, dizziness, fatigue  History of Present Illness:    Amanda [name] is a 72 y.o. female with a hx of hypertension, hyperlipidemia, hypothyroidism who was admitted to [facility] on November 11 after multiple falls and significant weight loss and was found to be in atrial fibrillation with RVR. Her echocardiogram performed today shows normal LVEF 55 to 60%, since the patient is in atrial fibrillation we do not have information about diastolic function. There is moderate mitral regurgitation with mild posterior leaflet prolapse, left atrium is moderately dilated, right ventricle systolic function is mildly reduced patient's right-sided pressures are mildly elevated with RVSP of 38 mmHg. She was discharged on metoprolol 75 mg PO BID and Eliquis 5 mg PO BID.  the plan was for cardioversion after 4 weeks of anticoagulation as outpatient.  However today she is coming in states that her heart has been beating real fast, she is more dizzy than usual and has had couple presyncopal episodes.  No syncope or falls.  No bleeding.  10/20/2018, the patient is coming after cardioversion there was performed today, she states that she felt better for about a day but then is back to her symptoms of dizziness palpitations and fatigue.  She denies any presyncope or syncope.  She has been compliant with her meds, and she has no bleeding.  Past Medical History:  Diagnosis Date  . A-fib ([place])   . Anemia due to blood loss   . Anxiety   . Arthritis   . Asthma    seasonal or with bronchitis  . Complication of anesthesia    "forgets to breathe" when she wakes up  . Depression   . Diabetes type 2, controlled ([place])    fasting 100-120  .  GERD (gastroesophageal reflux disease)   . Headache   . Heart murmur   . Hepatitis    A -- as teenager  . Hyperlipidemia   . Hypertension   . Hypothyroidism   . Mitral valve prolapse   . OSA on CPAP   . Pneumonia    hx of  . Vertigo    Past Surgical History:  Procedure Laterality Date  . ABDOMINAL HYSTERECTOMY    . CARDIOVERSION N/A 10/11/2018   Procedure: CARDIOVERSION;  Surgeon: Dorothy Spark, MD;  Location: [facility] ENDOSCOPY;  Service: Cardiovascular;  Laterality: N/A;  . CHOLECYSTECTOMY    . FRACTURE SURGERY Right    leg  . GALLBLADDER SURGERY  1995  . JOINT REPLACEMENT Left    knee  . NASAL SINUS SURGERY  2005  . PARTIAL HYSTERECTOMY  1983  . TEE WITHOUT CARDIOVERSION N/A 10/11/2018   Procedure: TRANSESOPHAGEAL ECHOCARDIOGRAM (TEE);  Surgeon: Dorothy Spark, MD;  Location: [facility] ENDOSCOPY;  Service: Cardiovascular;  Laterality: N/A;  . THROAT SURGERY  2005  . TOTAL KNEE ARTHROPLASTY Right 09/12/2014   Procedure: TOTAL KNEE ARTHROPLASTY;  Surgeon: Meredith Pel, MD;  Location: [place];  Service: Orthopedics;  Laterality: Right;   Current Medications: Current Meds  Medication Sig  . acetaminophen (TYLENOL) 500 MG tablet Take 1,000 mg by mouth 2 (two) times daily as needed for moderate pain.   Marland Kitchen apixaban (ELIQUIS) 5 MG TABS tablet Take 1 tablet (5 mg  total) by mouth 2 (two) times daily.  . ARTIFICIAL TEAR SOLUTION OP Place 1 drop into both eyes daily as needed (dry eyes).  Marland Kitchen buPROPion (WELLBUTRIN XL) 300 MG 24 hr tablet Take 1 tablet (300 mg total) by mouth daily.  . cetirizine (ZYRTEC) 10 MG tablet Take 10 mg by mouth daily as needed for allergies.  . clonazePAM (KLONOPIN) 0.5 MG tablet Take 0.5 mg by mouth 2 (two) times daily.  . cycloSPORINE (RESTASIS) 0.05 % ophthalmic emulsion Place 1 drop into both eyes 2 (two) times daily as needed (dry eyes).   . fluticasone (FLONASE) 50 MCG/ACT nasal spray Place 1 spray into both nostrils daily as needed for allergies.   Marland Kitchen  levothyroxine (SYNTHROID, LEVOTHROID) 25 MCG tablet Take 25 mcg by mouth daily before breakfast.   . metFORMIN (GLUCOPHAGE-XR) 500 MG 24 hr tablet Take 500 mg by mouth 2 (two) times daily.  . montelukast (SINGULAIR) 10 MG tablet Take 10 mg by mouth daily as needed (allergies).   Marland Kitchen omeprazole (PRILOSEC) 20 MG capsule Take 20 mg by mouth every evening.  Marland Kitchen oxybutynin (DITROPAN-XL) 10 MG 24 hr tablet Take 10 mg by mouth every evening.   . pravastatin (PRAVACHOL) 40 MG tablet Take 40 mg by mouth at bedtime.   . traZODone (DESYREL) 100 MG tablet Take 100-200 mg by mouth at bedtime.   Marland Kitchen venlafaxine (EFFEXOR) 50 MG tablet Take 2 tablets (100 mg total) by mouth daily.  . [DISCONTINUED] clonazePAM (KLONOPIN) 0.5 MG tablet Take 1 tablet (0.5 mg total) by mouth daily. (Patient taking differently: Take 0.5 mg by mouth 2 (two) times daily. )  . [DISCONTINUED] losartan (COZAAR) 25 MG tablet Take 25 mg by mouth daily.   . [DISCONTINUED] metoprolol tartrate (LOPRESSOR) 50 MG tablet Take 75 mg by mouth 2 (two) times daily.     Allergies:   Aciphex [rabeprazole sodium]; Coumadin [warfarin sodium]; Erythromycin; Lactose intolerance (gi); Macrodantin; Nsaids; Atorvastatin; Sulfa antibiotics; and Tape   Social History   Socioeconomic History  . Marital status: Married    Spouse name: Not on file  . Number of children: Not on file  . Years of education: Not on file  . Highest education level: Not on file  Occupational History  . Occupation: retired    Comment: Secondary school teacher  Social Needs  . Financial resource strain: Not on file  . Food insecurity:    Worry: Not on file    Inability: Not on file  . Transportation needs:    Medical: Not on file    Non-medical: Not on file  Tobacco Use  . Smoking status: Never Smoker  . Smokeless tobacco: Never Used  Substance and Sexual Activity  . Alcohol use: No    Comment: socially  . Drug use: No  . Sexual activity: Not on file  Lifestyle  . Physical  activity:    Days per week: Not on file    Minutes per session: Not on file  . Stress: Not on file  Relationships  . Social connections:    Talks on phone: Not on file    Gets together: Not on file    Attends religious service: Not on file    Active member of club or organization: Not on file    Attends meetings of clubs or organizations: Not on file    Relationship status: Not on file  Other Topics Concern  . Not on file  Social History Narrative  . Not on file    Family  History: The patient's family history includes Breast cancer in her maternal aunt; Cancer in her mother; Cancer - Colon in her mother.  ROS:   Please see the history of present illness.    All other systems reviewed and are negative.  EKGs/Labs/Other Studies Reviewed:    The following studies were reviewed today:  EKG:  EKG is ordered today.  The ekg ordered today demonstrates atrial fibrillation with rapid ventricular response, 118 bpm, this was personally reviewed.  Recent Labs: 09/27/2018: ALT 12; TSH 5.227 10/06/2018: BUN 24; Creatinine, Ser 1.58; Hemoglobin 11.4; Platelets 304; Potassium 3.9; Sodium 135  Recent Lipid Panel No results found for: CHOL, TRIG, HDL, CHOLHDL, VLDL, LDLCALC, LDLDIRECT  Physical Exam:    VS:  BP (!) 130/58   Pulse (!) 109   Ht 5\' 4"  (1.626 m)   Wt 177 lb (80.3 kg)   SpO2 94%   BMI 30.38 kg/m     Wt Readings from Last 3 Encounters:  10/20/18 177 lb (80.3 kg)  10/05/18 177 lb 12.8 oz (80.6 kg)  09/27/18 180 lb (81.6 kg)     GEN: Well nourished, well developed in no acute distress HEENT: Normal NECK: No JVD; No carotid bruits LYMPHATICS: No lymphadenopathy CARDIAC: iRRR, no murmurs, rubs, gallops RESPIRATORY: Clear to auscultation bilaterally, no wheezing or rhonchi  ABDOMEN: Soft, non-tender, non-distended MUSCULOSKELETAL:  No edema; No deformity  SKIN: Warm and dry NEUROLOGIC:  Alert and oriented x 3 PSYCHIATRIC:  Normal affect   ASSESSMENT:    1. PAF  (paroxysmal atrial fibrillation) ([place])   2. Atrial flutter, paroxysmal ([place])   3. Dizziness   4. Essential hypertension    PLAN:    In order of problems listed above:  Atrial fibrillation with persistent RVR Paroxysmal atrial flutter  Failed cardioversion, I will increase metoprolol to 100 mg p.o. twice daily, and refer to EP for further management and possible consideration of ablation. -She is symptomatic with dizziness palpitations and fatigue. -CHA2DS2/VAS Stroke RiskScore is 4 (HTN, Age, DM, female). -Continue Eliquis 500 mg p.o. twice daily  Hypertension -Discontinue losartan as am increasing metoprolol..  CKD stage 3  Hyperlipidemia -On pravastatin 40 mg daily  OSA -Patient states that she is compliant with CPAP.  We discussed how this is important in the treatment of atrial fibrillation.  Hypothyroidism -TSH is mildly elevated at 5.227. Free T4 is normal -Continues on levothyroxine  Frequent falls -Worse over the last few years.  The patient is on oxybutynin for significant urinary incontinence, which can be associated with dizziness and hypotension.  Atrial fibrillation with RVR may have contributed to lower blood pressure as well.  She also has a history of cerebral palsy and she feels like some of her weakness and falls is related to this.  Mitral valve prolapse -With moderate regurgitation.  I do not appreciate a murmur today. -We will follow this in our office  Follow-up with EP as soon as possible, follow-up with me in 3 months.  Medication Adjustments/Labs and Tests Ordered: Current medicines are reviewed at length with the patient today.  Concerns regarding medicines are outlined above.  Orders Placed This Encounter  Procedures  . Ambulatory referral to Cardiac Electrophysiology  . EKG 12-Lead   Meds ordered this encounter  Medications  . metoprolol tartrate (LOPRESSOR) 100 MG tablet    Sig: Take 1 tablet (100 mg total) by mouth 2 (two)  times daily.    Dispense:  180 tablet    Refill:  2  Patient Instructions  Medication Instructions:   STOP TAKING LOSARTAN NOW  INCREASE YOUR METOPROLOL TARTRATE 100 MG BY MOUTH TWICE DAILY   If you need a refill on your cardiac medications before your next appointment, please call your pharmacy.      You have been referred URGENTLY TO SEE AN ELECTROPHYSIOLOGIST IN OUR OFFICE PER DR Kordell Jafri--PER DR Tamarion Haymond THIS NEEDS TO BE SCHEDULED ASAP     Follow-Up:  URGENT REFERRAL TO OUR EP PROVIDERS  3 MONTHS WITH DR Kate Sable, Ena Dawley, MD  10/20/2018 10:26 AM    [place]

## 2018-10-20 NOTE — Telephone Encounter (Signed)
  Please fax visit note for 10/20/18 to [place]

## 2018-10-21 ENCOUNTER — Ambulatory Visit (HOSPITAL_COMMUNITY)
Admission: RE | Admit: 2018-10-21 | Discharge: 2018-10-21 | Disposition: A | Discharge status: Home / Self Care | Payer: Medicare Other | Source: Ambulatory Visit | Attending: Nurse Practitioner | Admitting: Nurse Practitioner

## 2018-10-21 ENCOUNTER — Encounter (HOSPITAL_COMMUNITY): Payer: Self-pay | Admitting: Nurse Practitioner

## 2018-10-21 VITALS — BP 114/76 | HR 108 | Ht 64.0 in | Wt 175.0 lb

## 2018-10-21 DIAGNOSIS — I4892 Unspecified atrial flutter: Secondary | ICD-10-CM | POA: Diagnosis not present

## 2018-10-21 DIAGNOSIS — J45909 Unspecified asthma, uncomplicated: Secondary | ICD-10-CM | POA: Diagnosis not present

## 2018-10-21 DIAGNOSIS — E119 Type 2 diabetes mellitus without complications: Secondary | ICD-10-CM | POA: Diagnosis not present

## 2018-10-21 DIAGNOSIS — F329 Major depressive disorder, single episode, unspecified: Secondary | ICD-10-CM | POA: Diagnosis not present

## 2018-10-21 DIAGNOSIS — Z888 Allergy status to other drugs, medicaments and biological substances status: Secondary | ICD-10-CM | POA: Diagnosis not present

## 2018-10-21 DIAGNOSIS — E785 Hyperlipidemia, unspecified: Secondary | ICD-10-CM | POA: Insufficient documentation

## 2018-10-21 DIAGNOSIS — I341 Nonrheumatic mitral (valve) prolapse: Secondary | ICD-10-CM | POA: Diagnosis not present

## 2018-10-21 DIAGNOSIS — E039 Hypothyroidism, unspecified: Secondary | ICD-10-CM | POA: Diagnosis not present

## 2018-10-21 DIAGNOSIS — Z881 Allergy status to other antibiotic agents status: Secondary | ICD-10-CM | POA: Diagnosis not present

## 2018-10-21 DIAGNOSIS — I480 Paroxysmal atrial fibrillation: Secondary | ICD-10-CM

## 2018-10-21 DIAGNOSIS — Z7901 Long term (current) use of anticoagulants: Secondary | ICD-10-CM | POA: Diagnosis not present

## 2018-10-21 DIAGNOSIS — K219 Gastro-esophageal reflux disease without esophagitis: Secondary | ICD-10-CM | POA: Insufficient documentation

## 2018-10-21 DIAGNOSIS — I4819 Other persistent atrial fibrillation: Secondary | ICD-10-CM

## 2018-10-21 DIAGNOSIS — F419 Anxiety disorder, unspecified: Secondary | ICD-10-CM | POA: Diagnosis not present

## 2018-10-21 DIAGNOSIS — Z79899 Other long term (current) drug therapy: Secondary | ICD-10-CM | POA: Diagnosis not present

## 2018-10-21 DIAGNOSIS — Z886 Allergy status to analgesic agent status: Secondary | ICD-10-CM | POA: Diagnosis not present

## 2018-10-21 DIAGNOSIS — I4891 Unspecified atrial fibrillation: Secondary | ICD-10-CM | POA: Diagnosis present

## 2018-10-21 DIAGNOSIS — Z882 Allergy status to sulfonamides status: Secondary | ICD-10-CM | POA: Insufficient documentation

## 2018-10-21 DIAGNOSIS — G4733 Obstructive sleep apnea (adult) (pediatric): Secondary | ICD-10-CM | POA: Insufficient documentation

## 2018-10-21 DIAGNOSIS — I10 Essential (primary) hypertension: Secondary | ICD-10-CM | POA: Insufficient documentation

## 2018-10-21 DIAGNOSIS — Z7984 Long term (current) use of oral hypoglycemic drugs: Secondary | ICD-10-CM | POA: Insufficient documentation

## 2018-10-21 LAB — BASIC METABOLIC PANEL
Anion gap: 9 (ref 5–15)
BUN: 13 mg/dL (ref 8–23)
CO2: 26 mmol/L (ref 22–32)
Calcium: 9.6 mg/dL (ref 8.9–10.3)
Chloride: 106 mmol/L (ref 98–111)
Creatinine, Ser: 1.28 mg/dL — ABNORMAL HIGH (ref 0.44–1.00)
GFR calc Af Amer: 48 mL/min — ABNORMAL LOW (ref >60)
GFR calc non Af Amer: 41 mL/min — ABNORMAL LOW (ref >60)
Glucose, Bld: 110 mg/dL — ABNORMAL HIGH (ref 70–99)
Potassium: 4.6 mmol/L (ref 3.5–5.1)
SODIUM: 141 mmol/L (ref 135–145)

## 2018-10-21 NOTE — Progress Notes (Signed)
Primary Care Physician: Aretta Nip, MD Referring Physician: Dr. Melchor Amour Amanda [name] is a 72 y.o. female with a h/o HTN, DM, hypothyroidism, weakness, falls, who presented to [facility]/11 with several weeks progressive dyspnea. Found to have Afib with RVR.CHA2DS2-VASc Score =4 (age, F, HTN, DM).  HASBLED 2 (age and history of bleeding), representing a 5-7% risk of stroke vs 5% risk of bleeding and started on anticoagulation.  TSH high normal.  Echo showed normal EF, mitral valve prolapse and moderate MR.  Eliquis approved by Cardiology. BB was titrated and she was discharged in afib to be cardioverted in one month.   Pt has f/u yesterday with Dr. Meda Coffee. She held in SR x one day. She felt improved during that time. Unfortunately she was back in afib yesterday and is in the afib clinic to discuss options to restore SR.  Her falls and weakness were thought secondary to  Klonopin, oxybutynin.  Her acute weakness was considerably better after HR normalized with metoprolol.  She states that she did fall/did not pass out, several nights ago and it took a couple of hours for her to get up to chair, "my legs were too weak to pull my self up."  Dr. Meda Coffee stopped losartan but did increase metoprolol.  She reports no alcohol or tobacco use.   Today, she denies symptoms of palpitations, chest pain, shortness of breath, orthopnea, PND, lower extremity edema, dizziness, presyncope, syncope, or neurologic sequela. The patient is tolerating medications without difficulties and is otherwise without complaint today.   Past Medical History:  Diagnosis Date  . A-fib ([place])   . Anemia due to blood loss   . Anxiety   . Arthritis   . Asthma    seasonal or with bronchitis  . Complication of anesthesia    "forgets to breathe" when she wakes up  . Depression   . Diabetes type 2, controlled ([place])    fasting 100-120  . GERD (gastroesophageal reflux disease)   . Headache   . Heart murmur   .  Hepatitis    A -- as teenager  . Hyperlipidemia   . Hypertension   . Hypothyroidism   . Mitral valve prolapse   . OSA on CPAP   . Pneumonia    hx of  . Vertigo    Past Surgical History:  Procedure Laterality Date  . ABDOMINAL HYSTERECTOMY    . CARDIOVERSION N/A 10/11/2018   Procedure: CARDIOVERSION;  Surgeon: Dorothy Spark, MD;  Location: [facility] ENDOSCOPY;  Service: Cardiovascular;  Laterality: N/A;  . CHOLECYSTECTOMY    . FRACTURE SURGERY Right    leg  . GALLBLADDER SURGERY  1995  . JOINT REPLACEMENT Left    knee  . NASAL SINUS SURGERY  2005  . PARTIAL HYSTERECTOMY  1983  . TEE WITHOUT CARDIOVERSION N/A 10/11/2018   Procedure: TRANSESOPHAGEAL ECHOCARDIOGRAM (TEE);  Surgeon: Dorothy Spark, MD;  Location: [facility] ENDOSCOPY;  Service: Cardiovascular;  Laterality: N/A;  . THROAT SURGERY  2005  . TOTAL KNEE ARTHROPLASTY Right 09/12/2014   Procedure: TOTAL KNEE ARTHROPLASTY;  Surgeon: Meredith Pel, MD;  Location: [place];  Service: Orthopedics;  Laterality: Right;    Current Outpatient Medications  Medication Sig Dispense Refill  . acetaminophen (TYLENOL) 500 MG tablet Take 1,000 mg by mouth 2 (two) times daily as needed for moderate pain.     Marland Kitchen apixaban (ELIQUIS) 5 MG TABS tablet Take 1 tablet (5 mg total) by mouth 2 (two) times daily.  60 tablet 0  . ARTIFICIAL TEAR SOLUTION OP Place 1 drop into both eyes daily as needed (dry eyes).    Marland Kitchen buPROPion (WELLBUTRIN XL) 300 MG 24 hr tablet Take 1 tablet (300 mg total) by mouth daily. 90 tablet 3  . cetirizine (ZYRTEC) 10 MG tablet Take 10 mg by mouth daily as needed for allergies.    . clonazePAM (KLONOPIN) 0.5 MG tablet Take 0.5 mg by mouth 2 (two) times daily.    . cycloSPORINE (RESTASIS) 0.05 % ophthalmic emulsion Place 1 drop into both eyes 2 (two) times daily as needed (dry eyes).     . fluticasone (FLONASE) 50 MCG/ACT nasal spray Place 1 spray into both nostrils daily as needed for allergies.     Marland Kitchen levothyroxine (SYNTHROID,  LEVOTHROID) 25 MCG tablet Take 25 mcg by mouth daily before breakfast.     . metFORMIN (GLUCOPHAGE-XR) 500 MG 24 hr tablet Take 500 mg by mouth 2 (two) times daily.    . metoprolol tartrate (LOPRESSOR) 100 MG tablet Take 1 tablet (100 mg total) by mouth 2 (two) times daily. 180 tablet 2  . montelukast (SINGULAIR) 10 MG tablet Take 10 mg by mouth daily as needed (allergies).   1  . omeprazole (PRILOSEC) 20 MG capsule Take 20 mg by mouth every evening.    Marland Kitchen oxybutynin (DITROPAN-XL) 10 MG 24 hr tablet Take 10 mg by mouth every morning.   11  . pravastatin (PRAVACHOL) 40 MG tablet Take 40 mg by mouth at bedtime.     . traZODone (DESYREL) 100 MG tablet Take 100-200 mg by mouth at bedtime.     Marland Kitchen venlafaxine (EFFEXOR) 50 MG tablet Take 2 tablets (100 mg total) by mouth daily. 90 tablet 3   No current facility-administered medications for this encounter.     Allergies  Allergen Reactions  . Aciphex [Rabeprazole Sodium] Hives  . Coumadin [Warfarin Sodium] Other (See Comments)    Bleeding   . Erythromycin Nausea And Vomiting  . Lactose Intolerance (Gi) Diarrhea    Stomach pain  . Macrodantin Nausea And Vomiting  . Nsaids     Bleeding in bladder  . Atorvastatin Other (See Comments)    Body aches  . Sulfa Antibiotics Nausea Only and Other (See Comments)    Feel terrible  . Tape Rash    Social History   Socioeconomic History  . Marital status: Married    Spouse name: Not on file  . Number of children: Not on file  . Years of education: Not on file  . Highest education level: Not on file  Occupational History  . Occupation: retired    Comment: Secondary school teacher  Social Needs  . Financial resource strain: Not on file  . Food insecurity:    Worry: Not on file    Inability: Not on file  . Transportation needs:    Medical: Not on file    Non-medical: Not on file  Tobacco Use  . Smoking status: Never Smoker  . Smokeless tobacco: Never Used  Substance and Sexual Activity  . Alcohol  use: No    Comment: socially  . Drug use: No  . Sexual activity: Not on file  Lifestyle  . Physical activity:    Days per week: Not on file    Minutes per session: Not on file  . Stress: Not on file  Relationships  . Social connections:    Talks on phone: Not on file    Gets together: Not on file  Attends religious service: Not on file    Active member of club or organization: Not on file    Attends meetings of clubs or organizations: Not on file    Relationship status: Not on file  . Intimate partner violence:    Fear of current or ex partner: Not on file    Emotionally abused: Not on file    Physically abused: Not on file    Forced sexual activity: Not on file  Other Topics Concern  . Not on file  Social History Narrative  . Not on file    Family History  Problem Relation Age of Onset  . Cancer Mother        panceratic  . Cancer - Colon Mother   . Breast cancer Maternal Aunt     ROS- All systems are reviewed and negative except as per the HPI above  Physical Exam: Vitals:   10/21/18 0919  BP: 114/76  Pulse: (!) 108  Weight: 79.4 kg  Height: 5\' 4"  (1.626 m)   Wt Readings from Last 3 Encounters:  10/21/18 79.4 kg  10/20/18 80.3 kg  10/05/18 80.6 kg    Labs: Lab Results  Component Value Date   NA 141 10/21/2018   K 4.6 10/21/2018   CL 106 10/21/2018   CO2 26 10/21/2018   GLUCOSE 110 (H) 10/21/2018   BUN 13 10/21/2018   CREATININE 1.28 (H) 10/21/2018   CALCIUM 9.6 10/21/2018   Lab Results  Component Value Date   INR 1.13 09/27/2018   No results found for: CHOL, HDL, LDLCALC, TRIG   GEN- The patient is well appearing, alert and oriented x 3 today.   Head- normocephalic, atraumatic Eyes-  Sclera clear, conjunctiva pink Ears- hearing intact Oropharynx- clear Neck- supple, no JVP Lymph- no cervical lymphadenopathy Lungs- Clear to ausculation bilaterally, normal work of breathing Heart- irregular rate and rhythm, no murmurs, rubs or gallops,  PMI not laterally displaced GI- soft, NT, ND, + BS Extremities- no clubbing, cyanosis, or edema MS- no significant deformity or atrophy Skin- no rash or lesion Psych- euthymic mood, full affect Neuro- strength and sensation are intact  EKG-atrial  flutter at 108 bpm, qrs int 78 ms, qtc 517 ms    Assessment and Plan: 1. Symptomatic  Afib/flutter General education  re afib Pt failed cardioversion Discussed options, states that she has very little income and cannot afford any expensive medication May be able to use flecainide if stress test is low risk Will request lexi [facility]oview at [facility] Repeat bmet today as previous creatinine had showed an increase  She will not be an ablation candidate unless fails an antiarrythmic drug Continue metoprolol tartrate at 100 mg bid  2. chadsvasc score of 4 Continue eliquis 5 mg bid Stressed not to miss doses, she reports none missed  Will see back after stress test for further management  Butch Penny C. Carroll, [facility] [address] [place], [place] [address] [phone]

## 2018-10-27 ENCOUNTER — Ambulatory Visit: Payer: Medicare Other | Admitting: Physician Assistant

## 2018-10-28 ENCOUNTER — Telehealth (HOSPITAL_COMMUNITY): Payer: Self-pay | Admitting: *Deleted

## 2018-10-28 NOTE — Telephone Encounter (Signed)
Patient given detailed instructions per Myocardial Perfusion Study Information Sheet for the test on 11/03/18 at 10:00. Patient notified to arrive 15 minutes early and that it is imperative to arrive on time for appointment to keep from having the test rescheduled.  If you need to cancel or reschedule your appointment, please call the office within 24 hours of your appointment. . Patient verbalized understanding.Amanda [name]

## 2018-11-03 ENCOUNTER — Ambulatory Visit (HOSPITAL_COMMUNITY): Payer: Medicare Other | Attending: Cardiovascular Disease

## 2018-11-03 ENCOUNTER — Institutional Professional Consult (permissible substitution): Payer: Medicare Other | Admitting: Internal Medicine

## 2018-11-03 DIAGNOSIS — I4819 Other persistent atrial fibrillation: Secondary | ICD-10-CM

## 2018-11-03 LAB — MYOCARDIAL PERFUSION IMAGING
CHL CUP RESTING HR STRESS: 108 {beats}/min
CSEPPHR: 111 {beats}/min
LV dias vol: 76 mL (ref 46–106)
LV sys vol: 46 mL
SDS: 1
SRS: 1
SSS: 2
TID: 0.97

## 2018-11-03 MED ORDER — TECHNETIUM TC 99M TETROFOSMIN IV KIT
26.9000 | PACK | Freq: Once | INTRAVENOUS | Status: AC | PRN
  Administered 2018-11-03: 26.9 via INTRAVENOUS
  Filled 2018-11-03: qty 27

## 2018-11-03 MED ORDER — TECHNETIUM TC 99M TETROFOSMIN IV KIT
7.4000 | PACK | Freq: Once | INTRAVENOUS | Status: AC | PRN
  Administered 2018-11-03: 7.4 via INTRAVENOUS
  Filled 2018-11-03: qty 8

## 2018-11-03 MED ORDER — REGADENOSON 0.4 MG/5ML IV SOLN
0.4000 mg | Freq: Once | INTRAVENOUS | Status: AC
  Administered 2018-11-03: 0.4 mg via INTRAVENOUS

## 2018-11-17 DIAGNOSIS — K7200 Acute and subacute hepatic failure without coma: Secondary | ICD-10-CM

## 2018-11-17 HISTORY — DX: Acute and subacute hepatic failure without coma: K72.00

## 2018-11-22 ENCOUNTER — Other Ambulatory Visit: Payer: Self-pay

## 2018-11-22 ENCOUNTER — Ambulatory Visit (HOSPITAL_COMMUNITY)
Admission: RE | Admit: 2018-11-22 | Discharge: 2018-11-22 | Disposition: A | Discharge status: Home / Self Care | Payer: Medicare Other | Source: Ambulatory Visit | Attending: Nurse Practitioner | Admitting: Nurse Practitioner

## 2018-11-22 ENCOUNTER — Encounter (HOSPITAL_COMMUNITY): Payer: Self-pay | Admitting: Nurse Practitioner

## 2018-11-22 VITALS — BP 118/68 | HR 117 | Ht 64.0 in | Wt 173.0 lb

## 2018-11-22 DIAGNOSIS — Z7984 Long term (current) use of oral hypoglycemic drugs: Secondary | ICD-10-CM | POA: Diagnosis not present

## 2018-11-22 DIAGNOSIS — I4891 Unspecified atrial fibrillation: Secondary | ICD-10-CM | POA: Insufficient documentation

## 2018-11-22 DIAGNOSIS — Z79899 Other long term (current) drug therapy: Secondary | ICD-10-CM | POA: Diagnosis not present

## 2018-11-22 DIAGNOSIS — K219 Gastro-esophageal reflux disease without esophagitis: Secondary | ICD-10-CM | POA: Insufficient documentation

## 2018-11-22 DIAGNOSIS — Z882 Allergy status to sulfonamides status: Secondary | ICD-10-CM | POA: Insufficient documentation

## 2018-11-22 DIAGNOSIS — E039 Hypothyroidism, unspecified: Secondary | ICD-10-CM | POA: Insufficient documentation

## 2018-11-22 DIAGNOSIS — Z7901 Long term (current) use of anticoagulants: Secondary | ICD-10-CM | POA: Diagnosis not present

## 2018-11-22 DIAGNOSIS — E785 Hyperlipidemia, unspecified: Secondary | ICD-10-CM | POA: Diagnosis not present

## 2018-11-22 DIAGNOSIS — Z888 Allergy status to other drugs, medicaments and biological substances status: Secondary | ICD-10-CM | POA: Insufficient documentation

## 2018-11-22 DIAGNOSIS — I10 Essential (primary) hypertension: Secondary | ICD-10-CM | POA: Insufficient documentation

## 2018-11-22 DIAGNOSIS — E119 Type 2 diabetes mellitus without complications: Secondary | ICD-10-CM | POA: Insufficient documentation

## 2018-11-22 DIAGNOSIS — I4819 Other persistent atrial fibrillation: Secondary | ICD-10-CM

## 2018-11-22 DIAGNOSIS — G4733 Obstructive sleep apnea (adult) (pediatric): Secondary | ICD-10-CM | POA: Diagnosis not present

## 2018-11-22 DIAGNOSIS — Z79890 Hormone replacement therapy (postmenopausal): Secondary | ICD-10-CM | POA: Insufficient documentation

## 2018-11-22 LAB — BASIC METABOLIC PANEL
Anion gap: 9 (ref 5–15)
BUN: 19 mg/dL (ref 8–23)
CO2: 23 mmol/L (ref 22–32)
CREATININE: 1.14 mg/dL — AB (ref 0.44–1.00)
Calcium: 9.3 mg/dL (ref 8.9–10.3)
Chloride: 106 mmol/L (ref 98–111)
GFR calc Af Amer: 55 mL/min — ABNORMAL LOW (ref >60)
GFR calc non Af Amer: 48 mL/min — ABNORMAL LOW (ref >60)
Glucose, Bld: 125 mg/dL — ABNORMAL HIGH (ref 70–99)
Potassium: 4.1 mmol/L (ref 3.5–5.1)
Sodium: 138 mmol/L (ref 135–145)

## 2018-11-23 NOTE — Progress Notes (Signed)
Primary Care Physician: Aretta Nip, MD Referring Physician: Dr. Melchor Amour Amanda [name] is a 73 y.o. female with a h/o HTN, DM, hypothyroidism, weakness, falls, with h/o CP, who presented to [facility] 11/11 with several weeks progressive dyspnea. Found to have Afib with RVR.CHA2DS2-VASc Score =4 (age, F, HTN, DM).  HASBLED 2 (age and history of bleeding), representing a 5-7% risk of stroke vs 5% risk of bleeding and started on anticoagulation.  TSH high normal.  Echo showed normal EF, mitral valve prolapse and moderate MR.  Eliquis approved by Cardiology. BB was titrated and she was discharged in afib to be cardioverted in one month.   Pt has f/u yesterday with Dr. Meda Coffee. She held in SR x one day. She felt improved during that time. Unfortunately she was back in afib yesterday and is in the afib clinic to discuss options to restore SR.  Her falls and weakness were thought secondary to  Klonopin, oxybutynin.  Her acute weakness was considerably better after HR normalized with metoprolol.  She states that she did fall/did not pass out, several nights ago and it took a couple of hours for her to get up to chair, "my legs were too weak to pull my self up."  Dr. Meda Coffee stopped losartan but did increase metoprolol.  She reports no alcohol or tobacco use.   F/u in afib clinic 11/22/2018 for further consideration of antiarrythmic's. She recently had a stress test that was low risk. We discussed flecainide but drugs screened by PharmD and Wellbutrin with flecainide is contraindicated. Pt states that she has been on this a long time and she does not know if she would do well stopping it.   Today, she denies symptoms of palpitations, chest pain, shortness of breath, orthopnea, PND, lower extremity edema, dizziness, presyncope, syncope, or neurologic sequela. The patient is tolerating medications without difficulties and is otherwise without complaint today.   Past Medical History:  Diagnosis Date  .  A-fib ([place])   . Anemia due to blood loss   . Anxiety   . Arthritis   . Asthma    seasonal or with bronchitis  . Complication of anesthesia    "forgets to breathe" when she wakes up  . Depression   . Diabetes type 2, controlled ([place])    fasting 100-120  . GERD (gastroesophageal reflux disease)   . Headache   . Heart murmur   . Hepatitis    A -- as teenager  . Hyperlipidemia   . Hypertension   . Hypothyroidism   . Mitral valve prolapse   . OSA on CPAP   . Pneumonia    hx of  . Vertigo    Past Surgical History:  Procedure Laterality Date  . ABDOMINAL HYSTERECTOMY    . CARDIOVERSION N/A 10/11/2018   Procedure: CARDIOVERSION;  Surgeon: Dorothy Spark, MD;  Location[facility] ENDOSCOPY;  Service: Cardiovascular;  Laterality: N/A;  . CHOLECYSTECTOMY    . FRACTURE SURGERY Right    leg  . GALLBLADDER SURGERY  1995  . JOINT REPLACEMENT Left    knee  . NASAL SINUS SURGERY  2005  . PARTIAL HYSTERECTOMY  1983  . TEE WITHOUT CARDIOVERSION N/A 10/11/2018   Procedure: TRANSESOPHAGEAL ECHOCARDIOGRAM (TEE);  Surgeon: Dorothy Spark, MD;  Location: [facility] ENDOSCOPY;  Service: Cardiovascular;  Laterality: N/A;  . THROAT SURGERY  2005  . TOTAL KNEE ARTHROPLASTY Right 09/12/2014   Procedure: TOTAL KNEE ARTHROPLASTY;  Surgeon: Meredith Pel, MD;  Location: [facility]  OR;  Service: Orthopedics;  Laterality: Right;    Current Outpatient Medications  Medication Sig Dispense Refill  . apixaban (ELIQUIS) 5 MG TABS tablet Take 1 tablet (5 mg total) by mouth 2 (two) times daily. 60 tablet 0  . ARTIFICIAL TEAR SOLUTION OP Place 1 drop into both eyes daily as needed (dry eyes).    Marland Kitchen buPROPion (WELLBUTRIN XL) 300 MG 24 hr tablet Take 1 tablet (300 mg total) by mouth daily. 90 tablet 3  . cetirizine (ZYRTEC) 10 MG tablet Take 10 mg by mouth daily as needed for allergies.    . clonazePAM (KLONOPIN) 0.5 MG tablet Take 0.5 mg by mouth 2 (two) times daily.    . cycloSPORINE (RESTASIS) 0.05 % ophthalmic  emulsion Place 1 drop into both eyes 2 (two) times daily as needed (dry eyes).     . fluticasone (FLONASE) 50 MCG/ACT nasal spray Place 1 spray into both nostrils daily as needed for allergies.     Marland Kitchen levothyroxine (SYNTHROID, LEVOTHROID) 25 MCG tablet Take 25 mcg by mouth daily before breakfast.     . metFORMIN (GLUCOPHAGE-XR) 500 MG 24 hr tablet Take 500 mg by mouth 2 (two) times daily.    . metoprolol tartrate (LOPRESSOR) 100 MG tablet Take 1 tablet (100 mg total) by mouth 2 (two) times daily. 180 tablet 2  . montelukast (SINGULAIR) 10 MG tablet Take 10 mg by mouth daily as needed (allergies).   1  . omeprazole (PRILOSEC) 20 MG capsule Take 20 mg by mouth every evening.    Marland Kitchen oxybutynin (DITROPAN-XL) 10 MG 24 hr tablet Take 10 mg by mouth every morning.   11  . pravastatin (PRAVACHOL) 40 MG tablet Take 40 mg by mouth at bedtime.     . traZODone (DESYREL) 100 MG tablet Take 100-200 mg by mouth at bedtime.     Marland Kitchen venlafaxine (EFFEXOR) 50 MG tablet Take 2 tablets (100 mg total) by mouth daily. 90 tablet 3  . acetaminophen (TYLENOL) 500 MG tablet Take 1,000 mg by mouth 2 (two) times daily as needed for moderate pain.      No current facility-administered medications for this encounter.     Allergies  Allergen Reactions  . Aciphex [Rabeprazole Sodium] Hives  . Coumadin [Warfarin Sodium] Other (See Comments)    Bleeding   . Erythromycin Nausea And Vomiting  . Lactose Intolerance (Gi) Diarrhea    Stomach pain  . Macrodantin Nausea And Vomiting  . Nsaids     Bleeding in bladder  . Atorvastatin Other (See Comments)    Body aches  . Sulfa Antibiotics Nausea Only and Other (See Comments)    Feel terrible  . Tape Rash    Social History   Socioeconomic History  . Marital status: Married    Spouse name: Not on file  . Number of children: Not on file  . Years of education: Not on file  . Highest education level: Not on file  Occupational History  . Occupation: retired    Comment:  Secondary school teacher  Social Needs  . Financial resource strain: Not on file  . Food insecurity:    Worry: Not on file    Inability: Not on file  . Transportation needs:    Medical: Not on file    Non-medical: Not on file  Tobacco Use  . Smoking status: Never Smoker  . Smokeless tobacco: Never Used  Substance and Sexual Activity  . Alcohol use: No    Comment: socially  . Drug use:  No  . Sexual activity: Not on file  Lifestyle  . Physical activity:    Days per week: Not on file    Minutes per session: Not on file  . Stress: Not on file  Relationships  . Social connections:    Talks on phone: Not on file    Gets together: Not on file    Attends religious service: Not on file    Active member of club or organization: Not on file    Attends meetings of clubs or organizations: Not on file    Relationship status: Not on file  . Intimate partner violence:    Fear of current or ex partner: Not on file    Emotionally abused: Not on file    Physically abused: Not on file    Forced sexual activity: Not on file  Other Topics Concern  . Not on file  Social History Narrative  . Not on file    Family History  Problem Relation Age of Onset  . Cancer Mother        panceratic  . Cancer - Colon Mother   . Breast cancer Maternal Aunt     ROS- All systems are reviewed and negative except as per the HPI above  Physical Exam: Vitals:   11/22/18 1349  BP: 118/68  Pulse: (!) 117  Weight: 78.5 kg  Height: 5\' 4"  (1.626 m)   Wt Readings from Last 3 Encounters:  11/22/18 78.5 kg  11/03/18 79.4 kg  10/21/18 79.4 kg    Labs: Lab Results  Component Value Date   NA 138 11/22/2018   K 4.1 11/22/2018   CL 106 11/22/2018   CO2 23 11/22/2018   GLUCOSE 125 (H) 11/22/2018   BUN 19 11/22/2018   CREATININE 1.14 (H) 11/22/2018   CALCIUM 9.3 11/22/2018   Lab Results  Component Value Date   INR 1.13 09/27/2018   No results found for: CHOL, HDL, LDLCALC, TRIG   GEN- The patient is  well appearing, alert and oriented x 3 today.   Head- normocephalic, atraumatic Eyes-  Sclera clear, conjunctiva pink Ears- hearing intact Oropharynx- clear Neck- supple, no JVP Lymph- no cervical lymphadenopathy Lungs- Clear to ausculation bilaterally, normal work of breathing Heart- irregular rate and rhythm, no murmurs, rubs or gallops, PMI not laterally displaced GI- soft, NT, ND, + BS Extremities- no clubbing, cyanosis, or edema MS- no significant deformity or atrophy Skin- no rash or lesion Psych- euthymic mood, full affect Neuro- strength and sensation are intact  EKG-atrial  flutter at 108 bpm, qrs int 78 ms, qtc 517 ms    Assessment and Plan: 1. Symptomatic  Afib/flutter General education  re afib Pt failed cardioversion Discussed options, states that she has very little income and cannot afford any expensive medication Stress test was low risk, but is on Wellbutrin which is contraindicated with flecainide as it increases levels of flecainide,will further discuss with PharmD Repeat bmet today as previous creatinine had showed an increase  She will not be an ablation candidate unless fails an antiarrythmic drug Continue metoprolol tartrate at 100 mg bid  2. Chadsvasc score of 4 Continue eliquis 5 mg bid Stressed not to miss doses, she reports none missed  Addendum: 1/7- I called pt to let her know that PharmD thought it would be best to not to  combine Wellbutrin and flecainide. LMOM to call  back to see if she is willing to have her PCP try another antidepressant so flecainide can be used.  Geroge Baseman Shany Marinez, [facility] [address] [place],  [address] [phone]

## 2018-12-09 ENCOUNTER — Other Ambulatory Visit: Payer: Self-pay

## 2018-12-09 ENCOUNTER — Inpatient Hospital Stay (HOSPITAL_COMMUNITY)
Admission: EM | Admit: 2018-12-09 | Discharge: 2018-12-21 | DRG: 441 | Disposition: A | Discharge status: Skilled Nursing Facility | Payer: Medicare Other | Attending: Internal Medicine | Admitting: Internal Medicine

## 2018-12-09 ENCOUNTER — Emergency Department (HOSPITAL_COMMUNITY): Payer: Medicare Other

## 2018-12-09 ENCOUNTER — Inpatient Hospital Stay (HOSPITAL_COMMUNITY): Payer: Medicare Other

## 2018-12-09 DIAGNOSIS — E039 Hypothyroidism, unspecified: Secondary | ICD-10-CM | POA: Diagnosis present

## 2018-12-09 DIAGNOSIS — R571 Hypovolemic shock: Secondary | ICD-10-CM | POA: Diagnosis present

## 2018-12-09 DIAGNOSIS — M6282 Rhabdomyolysis: Secondary | ICD-10-CM

## 2018-12-09 DIAGNOSIS — R296 Repeated falls: Secondary | ICD-10-CM | POA: Diagnosis present

## 2018-12-09 DIAGNOSIS — R7401 Elevation of levels of liver transaminase levels: Secondary | ICD-10-CM

## 2018-12-09 DIAGNOSIS — Z79890 Hormone replacement therapy (postmenopausal): Secondary | ICD-10-CM

## 2018-12-09 DIAGNOSIS — Z7901 Long term (current) use of anticoagulants: Secondary | ICD-10-CM

## 2018-12-09 DIAGNOSIS — I129 Hypertensive chronic kidney disease with stage 1 through stage 4 chronic kidney disease, or unspecified chronic kidney disease: Secondary | ICD-10-CM | POA: Diagnosis present

## 2018-12-09 DIAGNOSIS — G4733 Obstructive sleep apnea (adult) (pediatric): Secondary | ICD-10-CM | POA: Diagnosis present

## 2018-12-09 DIAGNOSIS — E8770 Fluid overload, unspecified: Secondary | ICD-10-CM | POA: Diagnosis present

## 2018-12-09 DIAGNOSIS — I4891 Unspecified atrial fibrillation: Secondary | ICD-10-CM

## 2018-12-09 DIAGNOSIS — Z7984 Long term (current) use of oral hypoglycemic drugs: Secondary | ICD-10-CM

## 2018-12-09 DIAGNOSIS — T7849XA Other allergy, initial encounter: Secondary | ICD-10-CM | POA: Diagnosis present

## 2018-12-09 DIAGNOSIS — J90 Pleural effusion, not elsewhere classified: Secondary | ICD-10-CM | POA: Diagnosis present

## 2018-12-09 DIAGNOSIS — E1122 Type 2 diabetes mellitus with diabetic chronic kidney disease: Secondary | ICD-10-CM | POA: Diagnosis present

## 2018-12-09 DIAGNOSIS — K7290 Hepatic failure, unspecified without coma: Secondary | ICD-10-CM | POA: Diagnosis present

## 2018-12-09 DIAGNOSIS — Z96651 Presence of right artificial knee joint: Secondary | ICD-10-CM | POA: Diagnosis present

## 2018-12-09 DIAGNOSIS — I340 Nonrheumatic mitral (valve) insufficiency: Secondary | ICD-10-CM | POA: Diagnosis present

## 2018-12-09 DIAGNOSIS — R5381 Other malaise: Secondary | ICD-10-CM | POA: Diagnosis present

## 2018-12-09 DIAGNOSIS — R0902 Hypoxemia: Secondary | ICD-10-CM

## 2018-12-09 DIAGNOSIS — E872 Acidosis, unspecified: Secondary | ICD-10-CM | POA: Insufficient documentation

## 2018-12-09 DIAGNOSIS — Z452 Encounter for adjustment and management of vascular access device: Secondary | ICD-10-CM

## 2018-12-09 DIAGNOSIS — F329 Major depressive disorder, single episode, unspecified: Secondary | ICD-10-CM | POA: Diagnosis present

## 2018-12-09 DIAGNOSIS — K7201 Acute and subacute hepatic failure with coma: Secondary | ICD-10-CM | POA: Diagnosis not present

## 2018-12-09 DIAGNOSIS — E870 Hyperosmolality and hypernatremia: Secondary | ICD-10-CM | POA: Diagnosis not present

## 2018-12-09 DIAGNOSIS — N182 Chronic kidney disease, stage 2 (mild): Secondary | ICD-10-CM | POA: Diagnosis present

## 2018-12-09 DIAGNOSIS — R740 Nonspecific elevation of levels of transaminase and lactic acid dehydrogenase [LDH]: Secondary | ICD-10-CM

## 2018-12-09 DIAGNOSIS — L509 Urticaria, unspecified: Secondary | ICD-10-CM | POA: Diagnosis present

## 2018-12-09 DIAGNOSIS — I4819 Other persistent atrial fibrillation: Secondary | ICD-10-CM | POA: Diagnosis not present

## 2018-12-09 DIAGNOSIS — D688 Other specified coagulation defects: Secondary | ICD-10-CM | POA: Diagnosis present

## 2018-12-09 DIAGNOSIS — E875 Hyperkalemia: Secondary | ICD-10-CM | POA: Diagnosis present

## 2018-12-09 DIAGNOSIS — E876 Hypokalemia: Secondary | ICD-10-CM | POA: Diagnosis not present

## 2018-12-09 DIAGNOSIS — J9601 Acute respiratory failure with hypoxia: Secondary | ICD-10-CM | POA: Diagnosis not present

## 2018-12-09 DIAGNOSIS — N170 Acute kidney failure with tubular necrosis: Secondary | ICD-10-CM

## 2018-12-09 DIAGNOSIS — Z800 Family history of malignant neoplasm of digestive organs: Secondary | ICD-10-CM

## 2018-12-09 DIAGNOSIS — I482 Chronic atrial fibrillation, unspecified: Secondary | ICD-10-CM | POA: Diagnosis not present

## 2018-12-09 DIAGNOSIS — R112 Nausea with vomiting, unspecified: Secondary | ICD-10-CM

## 2018-12-09 DIAGNOSIS — E871 Hypo-osmolality and hyponatremia: Secondary | ICD-10-CM | POA: Diagnosis not present

## 2018-12-09 DIAGNOSIS — E785 Hyperlipidemia, unspecified: Secondary | ICD-10-CM | POA: Diagnosis present

## 2018-12-09 DIAGNOSIS — N179 Acute kidney failure, unspecified: Secondary | ICD-10-CM | POA: Diagnosis present

## 2018-12-09 DIAGNOSIS — K7200 Acute and subacute hepatic failure without coma: Secondary | ICD-10-CM | POA: Diagnosis not present

## 2018-12-09 DIAGNOSIS — I341 Nonrheumatic mitral (valve) prolapse: Secondary | ICD-10-CM | POA: Diagnosis present

## 2018-12-09 DIAGNOSIS — F419 Anxiety disorder, unspecified: Secondary | ICD-10-CM | POA: Diagnosis present

## 2018-12-09 DIAGNOSIS — K219 Gastro-esophageal reflux disease without esophagitis: Secondary | ICD-10-CM | POA: Diagnosis present

## 2018-12-09 DIAGNOSIS — D72829 Elevated white blood cell count, unspecified: Secondary | ICD-10-CM | POA: Diagnosis present

## 2018-12-09 DIAGNOSIS — Z803 Family history of malignant neoplasm of breast: Secondary | ICD-10-CM

## 2018-12-09 HISTORY — DX: Acute and subacute hepatic failure without coma: K72.00

## 2018-12-09 LAB — POCT I-STAT EG7
Acid-base deficit: 17 mmol/L — ABNORMAL HIGH (ref 0.0–2.0)
Bicarbonate: 9.6 mmol/L — ABNORMAL LOW (ref 20.0–28.0)
Calcium, Ion: 1.02 mmol/L — ABNORMAL LOW (ref 1.15–1.40)
HCT: 39 % (ref 36.0–46.0)
HEMOGLOBIN: 13.3 g/dL (ref 12.0–15.0)
O2 SAT: 66 %
Potassium: 5.6 mmol/L — ABNORMAL HIGH (ref 3.5–5.1)
Sodium: 135 mmol/L (ref 135–145)
TCO2: 10 mmol/L — ABNORMAL LOW (ref 22–32)
pCO2, Ven: 24.6 mmHg — ABNORMAL LOW (ref 44.0–60.0)
pH, Ven: 7.198 — CL (ref 7.250–7.430)
pO2, Ven: 41 mmHg (ref 32.0–45.0)

## 2018-12-09 LAB — CBC WITH DIFFERENTIAL/PLATELET
Abs Immature Granulocytes: 0.13 10*3/uL — ABNORMAL HIGH (ref 0.00–0.07)
Basophils Absolute: 0 10*3/uL (ref 0.0–0.1)
Basophils Relative: 0 %
Eosinophils Absolute: 0 10*3/uL (ref 0.0–0.5)
Eosinophils Relative: 0 %
HCT: 43.8 % (ref 36.0–46.0)
Hemoglobin: 13 g/dL (ref 12.0–15.0)
Immature Granulocytes: 1 %
LYMPHS ABS: 1.1 10*3/uL (ref 0.7–4.0)
Lymphocytes Relative: 7 %
MCH: 27.7 pg (ref 26.0–34.0)
MCHC: 29.7 g/dL — AB (ref 30.0–36.0)
MCV: 93.4 fL (ref 80.0–100.0)
Monocytes Absolute: 1.8 10*3/uL — ABNORMAL HIGH (ref 0.1–1.0)
Monocytes Relative: 12 %
Neutro Abs: 12.2 10*3/uL — ABNORMAL HIGH (ref 1.7–7.7)
Neutrophils Relative %: 80 %
PLATELETS: 294 10*3/uL (ref 150–400)
RBC: 4.69 MIL/uL (ref 3.87–5.11)
RDW: 14.4 % (ref 11.5–15.5)
WBC: 15.2 10*3/uL — ABNORMAL HIGH (ref 4.0–10.5)
nRBC: 0 % (ref 0.0–0.2)

## 2018-12-09 LAB — COMPREHENSIVE METABOLIC PANEL
ALBUMIN: 3.8 g/dL (ref 3.5–5.0)
ALT: 1690 U/L — ABNORMAL HIGH (ref 0–44)
AST: 3257 U/L — ABNORMAL HIGH (ref 15–41)
Alkaline Phosphatase: 111 U/L (ref 38–126)
Anion gap: 24 — ABNORMAL HIGH (ref 5–15)
BUN: 29 mg/dL — ABNORMAL HIGH (ref 8–23)
CHLORIDE: 100 mmol/L (ref 98–111)
CO2: 11 mmol/L — ABNORMAL LOW (ref 22–32)
Calcium: 9.6 mg/dL (ref 8.9–10.3)
Creatinine, Ser: 1.91 mg/dL — ABNORMAL HIGH (ref 0.44–1.00)
GFR calc Af Amer: 30 mL/min — ABNORMAL LOW (ref >60)
GFR calc non Af Amer: 26 mL/min — ABNORMAL LOW (ref >60)
Glucose, Bld: 81 mg/dL (ref 70–99)
Potassium: 5.7 mmol/L — ABNORMAL HIGH (ref 3.5–5.1)
Sodium: 135 mmol/L (ref 135–145)
Total Bilirubin: 3.6 mg/dL — ABNORMAL HIGH (ref 0.3–1.2)
Total Protein: 6.3 g/dL — ABNORMAL LOW (ref 6.5–8.1)

## 2018-12-09 LAB — LACTIC ACID, PLASMA: Lactic Acid, Venous: 10.4 mmol/L (ref 0.5–1.9)

## 2018-12-09 LAB — CK: CK TOTAL: 1150 U/L — AB (ref 38–234)

## 2018-12-09 LAB — ACETAMINOPHEN LEVEL

## 2018-12-09 LAB — TSH: TSH: 3.903 u[IU]/mL (ref 0.350–4.500)

## 2018-12-09 LAB — MAGNESIUM: Magnesium: 2 mg/dL (ref 1.7–2.4)

## 2018-12-09 LAB — ETHANOL: Alcohol, Ethyl (B): <10 mg/dL (ref <10)

## 2018-12-09 LAB — PROTIME-INR
INR: 6.81 — AB
PROTHROMBIN TIME: 57.9 s — AB (ref 11.4–15.2)

## 2018-12-09 LAB — LIPASE, BLOOD: Lipase: 22 U/L (ref 11–51)

## 2018-12-09 LAB — BILIRUBIN, DIRECT: Bilirubin, Direct: 1.6 mg/dL — ABNORMAL HIGH (ref 0.0–0.2)

## 2018-12-09 LAB — PREPARE RBC (CROSSMATCH)

## 2018-12-09 LAB — CBG MONITORING, ED: Glucose-Capillary: 74 mg/dL (ref 70–99)

## 2018-12-09 MED ORDER — METRONIDAZOLE IN NACL 5-0.79 MG/ML-% IV SOLN
500.0000 mg | Freq: Three times a day (TID) | INTRAVENOUS | Status: DC
  Administered 2018-12-10 – 2018-12-12 (×7): 500 mg via INTRAVENOUS
  Filled 2018-12-09 (×7): qty 100

## 2018-12-09 MED ORDER — ACETYLCYSTEINE LOAD VIA INFUSION
150.0000 mg/kg | Freq: Once | INTRAVENOUS | Status: AC
  Administered 2018-12-09: 11565 mg via INTRAVENOUS
  Filled 2018-12-09: qty 290

## 2018-12-09 MED ORDER — INSULIN ASPART 100 UNIT/ML ~~LOC~~ SOLN: 0.0000 [IU] | SUBCUTANEOUS | Status: DC

## 2018-12-09 MED ORDER — SODIUM CHLORIDE 0.9 % IV BOLUS
1000.0000 mL | Freq: Once | INTRAVENOUS | Status: AC
  Administered 2018-12-09: 1000 mL via INTRAVENOUS

## 2018-12-09 MED ORDER — DEXTROSE 5 % IV SOLN
15.0000 mg/kg/h | INTRAVENOUS | Status: DC
  Administered 2018-12-09: 15 mg/kg/h via INTRAVENOUS
  Filled 2018-12-09: qty 200

## 2018-12-09 MED ORDER — METRONIDAZOLE IN NACL 5-0.79 MG/ML-% IV SOLN
500.0000 mg | Freq: Once | INTRAVENOUS | Status: AC
  Administered 2018-12-09: 500 mg via INTRAVENOUS
  Filled 2018-12-09: qty 100

## 2018-12-09 MED ORDER — LACTULOSE 10 GM/15ML PO SOLN
30.0000 g | Freq: Three times a day (TID) | ORAL | Status: DC
  Administered 2018-12-09 – 2018-12-10 (×2): 30 g via ORAL
  Filled 2018-12-09 (×3): qty 45

## 2018-12-09 MED ORDER — SODIUM BICARBONATE 8.4 % IV SOLN
INTRAVENOUS | Status: DC
  Administered 2018-12-09 – 2018-12-11 (×2): via INTRAVENOUS
  Filled 2018-12-09 (×4): qty 150

## 2018-12-09 MED ORDER — SODIUM CHLORIDE 0.9 % IV SOLN
10.0000 mL/h | Freq: Once | INTRAVENOUS | Status: AC
  Administered 2018-12-09: 10 mL/h via INTRAVENOUS

## 2018-12-09 MED ORDER — PHYTONADIONE 5 MG PO TABS
2.5000 mg | ORAL_TABLET | Freq: Once | ORAL | Status: AC
  Administered 2018-12-09: 2.5 mg via ORAL
  Filled 2018-12-09: qty 1

## 2018-12-09 MED ORDER — LACTATED RINGERS IV BOLUS
500.0000 mL | Freq: Once | INTRAVENOUS | Status: AC
  Administered 2018-12-09: 500 mL via INTRAVENOUS

## 2018-12-09 MED ORDER — INSULIN ASPART 100 UNIT/ML ~~LOC~~ SOLN
1.0000 [IU] | SUBCUTANEOUS | Status: DC
  Administered 2018-12-10: 3 [IU] via SUBCUTANEOUS
  Administered 2018-12-10 (×4): 2 [IU] via SUBCUTANEOUS
  Administered 2018-12-11: 3 [IU] via SUBCUTANEOUS
  Administered 2018-12-11 (×2): 2 [IU] via SUBCUTANEOUS

## 2018-12-09 MED ORDER — SODIUM CHLORIDE 0.9 % IV SOLN
1.0000 g | Freq: Once | INTRAVENOUS | Status: AC
  Administered 2018-12-09: 1 g via INTRAVENOUS
  Filled 2018-12-09: qty 10

## 2018-12-09 MED ORDER — LACTATED RINGERS IV BOLUS
1000.0000 mL | Freq: Once | INTRAVENOUS | Status: AC
  Administered 2018-12-09: 1000 mL via INTRAVENOUS

## 2018-12-09 MED ORDER — SODIUM CHLORIDE 0.9 % IV SOLN
1.0000 g | INTRAVENOUS | Status: DC
  Administered 2018-12-10 – 2018-12-11 (×2): 1 g via INTRAVENOUS
  Filled 2018-12-09 (×2): qty 10

## 2018-12-09 MED ORDER — LEVOTHYROXINE SODIUM 25 MCG PO TABS
25.0000 ug | ORAL_TABLET | Freq: Every day | ORAL | Status: DC
  Administered 2018-12-10 – 2018-12-21 (×12): 25 ug via ORAL
  Filled 2018-12-09 (×12): qty 1

## 2018-12-09 MED ORDER — DILTIAZEM HCL-DEXTROSE 100-5 MG/100ML-% IV SOLN (PREMIX)
5.0000 mg/h | INTRAVENOUS | Status: DC
  Administered 2018-12-09 – 2018-12-11 (×3): 5 mg/h via INTRAVENOUS
  Administered 2018-12-11: 10 mg/h via INTRAVENOUS
  Filled 2018-12-09 (×6): qty 100

## 2018-12-09 NOTE — ED Triage Notes (Addendum)
Per [facility], Pt reports N/V, weakness and dizziness x 5 days. Pt has hx of a-fib. Pt in A-fib 100-150s. Pt pale upon arrival. EMS reports BP 70/30. Pt is alert and oriented x 4, pt sleepy, but repsonds to voice and remains awake during assessment. Pt denies hematemesis.

## 2018-12-09 NOTE — H&P (Signed)
NAME:  Amanda [name], MRN:  [id number], DOB:  1946/09/01, LOS: 0 ADMISSION DATE:  12/09/2018, CONSULTATION DATE:  1/23 REFERRING MD:  Dr. 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  History of present illness   73 year old female with past medical history as below, which is significant for atrial fibrillation on Eliquis, OSA on CPAP, hypertension, diabetes, and frequent falls.  Atrial fibrillation was her most recent diagnosis back in November 2019 and she was started on metoprolol and Eliquis at that time.  In reviewing her cardiology outpatient notes it seems that she is followed her medication regimen pretty strictly, however, in the months since her diagnosis she has frequently been in A. fib and has felt very "beat down"as a result.    Upon presenting to [facility] emergency department on 1/23 she complained of a 4 to 5-day history of nausea and vomiting.  Emesis described as clear mucus.  The symptoms did improve after the first 2 days, however, she continued to be weak and dizzy up until her presentation.  She denies having abdominal pain, hematemesis, diarrhea, constipation, hematochezia, cough, fevers, chills.  Describes normal bowel movements.  She seems to be a good historian and the history she gave seem to match up with chart review, however, she then told me she had been in the hospital for the last 2 days which is untrue.  She also describes having been on Eliquis for only a couple of weeks when it looks like this was started back in November 2019.   Laboratory evaluation in the emergency department was significant for AST 3257, ALT 1690, total bilirubin 3.6, CK 1150, lactic acid 10.4, potassium 5.7, bicarbonate 11, WBC 15.2, INR 6.81.  CT scan of the abdomen and pelvis demonstrated a possible mild pancreatitis with a small right pleural effusion.  She was treated  with ceftriaxone and Flagyl.  And was loaded with acetylcysteine and continued on an infusion.  Given the critical nature of her illness PCCM was asked to admit.  The emergency physician has contacted the hospital to [facility] for transfer.  She has been accepted, but is awaiting bed assignment.   Past Medical History   has a past medical history of A-fib ([place]), Anemia due to blood loss, Anxiety, Arthritis, Asthma, Complication of anesthesia, Depression, Diabetes type 2, controlled ([place]), GERD (gastroesophageal reflux disease), Headache, Heart murmur, Hepatitis, Hyperlipidemia, Hypertension, Hypothyroidism, Mitral valve prolapse, OSA on CPAP, Pneumonia, and Vertigo.  Significant Hospital Events   1/23 admit  Consults:    Procedures:    Significant Diagnostic Tests:  CT abd, pelv 1/23 > Subtle hazy attenuation of the peripancreatic fat in the region of the head of the pancreas/common bile duct which could be seen due to mild acute pancreatitis. Recommend clinical correlation. Small right pleural effusion with mild dependent right basilar atelectasis. Bilateral renal cysts unchanged.   Micro Data:  Blood 1/23 > Urine 1/23 >  Antimicrobials:  CTX 1/23 > Flagyl 1/23 >  Interim history/subjective:    Objective   Blood pressure 111/80, pulse (!) 131, resp. rate (!) 32, weight 77.1 kg, SpO2 96 %.        Intake/Output Summary (Last 24 hours) at 12/09/2018 2154 Last data filed at 12/09/2018 2106 Gross per 24 hour  Intake 2700 ml  Output -  Net 2700 ml   Filed Weights   12/09/18  2000  Weight: 77.1 kg    Examination: General: Overweight elderly female in no acute distress HENT: [place]/At, PERRL, No appreciable JVD Lungs: Clear bilateral breath sounds Cardiovascular: Tachycardic, irregularly irregular, no MRG Abdomen: Soft, non-tender, non-distended. Hypoactive bowel sounds.  Extremities: No acute deformity or ROM limitation. No edema.  Neuro: Alert, oriented  x 3 and non-focal. However, she does seem to have some delirium and has answered some questions incorrectly.   Resolved Hospital Problem list      Assessment & Plan:   Hepatic failure: Etiology unclear. Associated hepatic encephalopathy and coagulopathy - NAC loaded in ED, continue infusion - Doppler liver - Check ammonia - Lactulose - Trend LFT, Coags - Acute hepatitis panel - Continue ceftriaxone and flagyl until intraabdominal infection can be ruled out.  - GI consult in the AM if she is not already transferred.  - Awaiting transfer to [facility]  AKI: likely secondary to rhabdomyolysis. CK 1,150 on admission.  - Gentle hydration - Repeat CK  Hyperkalemia - bicarb infusion - Repeat BMP  Anion gap metabolic acidosis: Lactic, may be a small element of NAG due to GI losses. This is mostly if not all a gap acidosis.  - trend lactic - bicarb infusion  Possible pancreatitis on CT: Lipase on admission 22 - Supportive care.   - NPO  Atrial fibrillation with RVR - Telemetry monitoring - Gentle hydration - Monitor for now - If sustains above 130 can start diltiazem.   DM - SSI  OSA on CPAP - hold CPAP due to vomiting  Hypothyroid TSH wnl - continue synthroid  Best practice:  Diet: NPO Pain/Anxiety/Delirium protocol (if indicated): na VAP protocol (if indicated): na DVT prophylaxis: Coagulopathic GI prophylaxis: not yet indicated Glucose control: SSI Mobility: BR Code Status: FULL Family Communication: Patient updated.  Disposition: ICU, pending transfer to [facility].   Labs   CBC: Recent Labs  Lab 12/09/18 1613 12/09/18 1833  WBC 15.2*  --   NEUTROABS 12.2*  --   HGB 13.0 13.3  HCT 43.8 39.0  MCV 93.4  --   PLT 294  --     Basic Metabolic Panel: Recent Labs  Lab 12/09/18 1613 12/09/18 1833  NA 135 135  K 5.7* 5.6*  CL 100  --   CO2 11*  --   GLUCOSE 81  --   BUN 29*  --   CREATININE 1.91*  --   CALCIUM 9.6  --   MG 2.0  --    GFR: Estimated  Creatinine Clearance: 26.4 mL/min (A) (by C-G formula based on SCr of 1.91 mg/dL (H)). Recent Labs  Lab 12/09/18 1613 12/09/18 1805  WBC 15.2*  --   LATICACIDVEN  --  10.4*    Liver Function Tests: Recent Labs  Lab 12/09/18 1613  AST 3,257*  ALT 1,690*  ALKPHOS 111  BILITOT 3.6*  PROT 6.3*  ALBUMIN 3.8   Recent Labs  Lab 12/09/18 1613  LIPASE 22   No results for input(s): AMMONIA in the last 168 hours.  ABG    Component Value Date/Time   HCO3 9.6 (L) 12/09/2018 1833   TCO2 10 (L) 12/09/2018 1833   ACIDBASEDEF 17.0 (H) 12/09/2018 1833   O2SAT 66.0 12/09/2018 1833     Coagulation Profile: Recent Labs  Lab 12/09/18 1613  INR 6.81*    Cardiac Enzymes: Recent Labs  Lab 12/09/18 1950  CKTOTAL 1,150*    HbA1C: Hgb A1c MFr Bld  Date/Time Value Ref Range Status  10/12/2014 10:14 AM  5.9 (H) <5.7 % Final    Comment:    (NOTE)                                                                       According to the ADA Clinical Practice Recommendations for 2011, when HbA1c is used as a screening test:  >=6.5%   Diagnostic of Diabetes Mellitus           (if abnormal result is confirmed) 5.7-6.4%   Increased risk of developing Diabetes Mellitus References:Diagnosis and Classification of Diabetes Mellitus,Diabetes VOJJ,0093,81(WEXHB 1):S62-S69 and Standards of Medical Care in         Diabetes - 2011,Diabetes ZJIR,6789,38 (Suppl 1):S11-S61.     CBG: Recent Labs  Lab 12/09/18 1600  GLUCAP 74    Review of Systems:   Bolds are positive  Constitutional: weight loss, gain, night sweats, Fevers, chills, fatigue .  HEENT: headaches, Sore throat, sneezing, nasal congestion, post nasal drip, Difficulty swallowing, Tooth/dental problems, visual complaints visual changes, ear ache CV:  chest pain, radiates:,Orthopnea, PND, swelling in lower extremities, dizziness, palpitations, syncope.  GI  heartburn, indigestion, abdominal pain, nausea, vomiting, diarrhea, change  in bowel habits, loss of appetite, bloody stools.  Resp: cough, productive:, hemoptysis, dyspnea, chest pain, pleuritic.  Skin: rash or itching or icterus GU: dysuria, change in color of urine, urgency or frequency. flank pain, hematuria  MS: joint pain or swelling. decreased range of motion  Psych: change in mood or affect. depression or anxiety.  Neuro: difficulty with speech, weakness, numbness, ataxia    Past Medical History  She,  has a past medical history of A-fib ([place]), Anemia due to blood loss, Anxiety, Arthritis, Asthma, Complication of anesthesia, Depression, Diabetes type 2, controlled ([place]), GERD (gastroesophageal reflux disease), Headache, Heart murmur, Hepatitis, Hyperlipidemia, Hypertension, Hypothyroidism, Mitral valve prolapse, OSA on CPAP, Pneumonia, and Vertigo.   Surgical History    Past Surgical History:  Procedure Laterality Date  . ABDOMINAL HYSTERECTOMY    . CARDIOVERSION N/A 10/11/2018   Procedure: CARDIOVERSION;  Surgeon: Dorothy Spark, MD;  Location: [facility] ENDOSCOPY;  Service: Cardiovascular;  Laterality: N/A;  . CHOLECYSTECTOMY    . FRACTURE SURGERY Right    leg  . GALLBLADDER SURGERY  1995  . JOINT REPLACEMENT Left    knee  . NASAL SINUS SURGERY  2005  . PARTIAL HYSTERECTOMY  1983  . TEE WITHOUT CARDIOVERSION N/A 10/11/2018   Procedure: TRANSESOPHAGEAL ECHOCARDIOGRAM (TEE);  Surgeon: Dorothy Spark, MD;  Location: [facility] ENDOSCOPY;  Service: Cardiovascular;  Laterality: N/A;  . THROAT SURGERY  2005  . TOTAL KNEE ARTHROPLASTY Right 09/12/2014   Procedure: TOTAL KNEE ARTHROPLASTY;  Surgeon: Meredith Pel, MD;  Location: [place];  Service: Orthopedics;  Laterality: Right;     Social History   reports that she has never smoked. She has never used smokeless tobacco. She reports that she does not drink alcohol or use drugs.   Family History   Her family history includes Breast cancer in her maternal aunt; Cancer in her mother; Cancer - Colon in her  mother.   Allergies Allergies  Allergen Reactions  . Aciphex [Rabeprazole Sodium] Hives  . Coumadin [Warfarin Sodium] Other (See Comments)    Bleeding   . Erythromycin Nausea  And Vomiting  . Lactose Intolerance (Gi) Diarrhea    Stomach pain  . Macrodantin Nausea And Vomiting  . Nsaids     Bleeding in bladder  . Atorvastatin Other (See Comments)    Body aches  . Sulfa Antibiotics Nausea Only and Other (See Comments)    Feel terrible  . Tape Rash     Home Medications  Prior to Admission medications   Medication Sig Start Date End Date Taking? Authorizing Provider  apixaban (ELIQUIS) 5 MG TABS tablet Take 1 tablet (5 mg total) by mouth 2 (two) times daily. 09/28/18  Yes Danford, Suann Larry, MD  ARTIFICIAL TEAR SOLUTION OP Place 1 drop into both eyes daily as needed (dry eyes).   Yes [provider]  buPROPion (WELLBUTRIN XL) 300 MG 24 hr tablet Take 1 tablet (300 mg total) by mouth daily. 01/22/17  Yes Dohmeier, Asencion Partridge, MD  cetirizine (ZYRTEC) 10 MG tablet Take 10 mg by mouth daily as needed for allergies.   Yes [provider]  clonazePAM (KLONOPIN) 0.5 MG tablet Take 0.5 mg by mouth 2 (two) times daily.   Yes [provider]  fluticasone (FLONASE) 50 MCG/ACT nasal spray Place 1 spray into both nostrils daily as needed for allergies.  08/20/17  Yes [provider]  levothyroxine (SYNTHROID, LEVOTHROID) 25 MCG tablet Take 25 mcg by mouth daily before breakfast.    Yes [provider]  metFORMIN (GLUCOPHAGE-XR) 500 MG 24 hr tablet Take 500 mg by mouth 2 (two) times daily.   Yes [provider]  montelukast (SINGULAIR) 10 MG tablet Take 10 mg by mouth daily as needed (allergies).  12/28/17  Yes [provider]  omeprazole (PRILOSEC) 20 MG capsule Take 20 mg by mouth every evening.   Yes [provider]  oxybutynin (DITROPAN-XL) 10 MG 24 hr tablet Take 10 mg by mouth every morning.  06/17/17  Yes [provider]  pravastatin (PRAVACHOL) 40 MG tablet Take 40 mg by mouth at bedtime.    Yes [provider]  traZODone (DESYREL) 100 MG tablet Take 100 mg by mouth at bedtime.    Yes [provider]  venlafaxine (EFFEXOR) 50 MG tablet Take 2 tablets (100 mg total) by mouth daily. 11/22/15  Yes Dohmeier, Asencion Partridge, MD     Critical care time: 36 mins     Georgann Housekeeper, [facility] [place] Pager [phone] or [phone]  12/09/2018 10:29 PM

## 2018-12-09 NOTE — ED Provider Notes (Signed)
[facility] EMERGENCY DEPARTMENT Provider Note   CSN: [id number] Arrival date & time: 12/09/18  1553     History   Chief Complaint Chief Complaint  Patient presents with  . Hypotension  . Emesis    HPI Amanda [name] is a 73 y.o. female.  HPI   Amanda [name] is a 73 y.o. female with PMH of A. fib on Eliquis and Lopressor, anemia, anxiety, arthritis, asthma, pneumonia, hypothyroidism, anxiety, headache, hepatitis, hypertension, GERD, type 2 diabetes, HLD, OSA on CPAP who presents with 5 days of nausea and vomiting.  She has been progressively weaker. Reports that she has had no abdominal pain but worsening nausea with several episodes of nonbloody nonbilious emesis with no coffee-ground emesis every day for the past 5 days.  It has worsened.  She denies melena or hematochezia.  Has had normal bowel movements and is passing flatus.  No abdominal pain including cramping.  No recent falls or trauma.  Compliant with all of her medications and states that she thinks she has been able to keep them down the last few days including her Eliquis today.  She thinks that she was taken off of metoprolol and her most recent cardiology appointment.  States that she may have had a fever for the last few days but felt that it "broke" before EMS arrived.  Had no objective fever at home.  No chest pain but endorses significant dyspnea and feels that she has been in atrial fibrillation for the last 5 to 6 days.  States that normally she goes between sinus and A. fib but has been in A. fib nonstop recently.  Past Medical History:  Diagnosis Date  . A-fib ([place])   . Anemia due to blood loss   . Anxiety   . Arthritis   . Asthma    seasonal or with bronchitis  . Complication of anesthesia    "forgets to breathe" when she wakes up  . Depression   . Diabetes type 2, controlled ([place])    fasting 100-120  . GERD (gastroesophageal reflux disease)   . Headache   . Heart murmur   .  Hepatitis    A -- as teenager  . Hyperlipidemia   . Hypertension   . Hypothyroidism   . Mitral valve prolapse   . OSA on CPAP   . Pneumonia    hx of  . Vertigo     Patient Active Problem List   Diagnosis Date Noted  . Acute liver failure without hepatic coma   . Acute kidney injury (AKI) with acute tubular necrosis (ATN) (HCC)   . Hyperkalemia   . Metabolic acidosis   . Atrial fibrillation with RVR ([place]) 09/27/2018  . Weakness generalized 09/27/2018  . Poor compliance with CPAP treatment 01/27/2018  . Trochanteric bursitis of right hip 12/01/2017  . 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 Acute bronchitis due to Streptococcus 11/22/2015  . OSA on CPAP 11/22/2015  . Depression (emotion) 11/22/2015  . Abnormal CT scan   . Hematoma of left psoas region to anticoagulant therapy 10/11/2014  . Coagulopathy ([place]) 10/11/2014  . Psoas hematoma, left, secondary to anticoagulant therapy 10/11/2014  . Acute hyponatremia 10/11/2014  . S/P total knee arthroplasty 09/24/2014  . Hypertension   . GERD (gastroesophageal reflux disease)   . Diabetes type 2, controlled ([place])   . Depression   . Hyperlipidemia   . 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 Cough 08/31/2013    Past Surgical History:  Procedure Laterality  Date  . ABDOMINAL HYSTERECTOMY    . CARDIOVERSION N/A 10/11/2018   Procedure: CARDIOVERSION;  Surgeon: Dorothy Spark, MD;  Location: [facility] ENDOSCOPY;  Service: Cardiovascular;  Laterality: N/A;  . CHOLECYSTECTOMY    . FRACTURE SURGERY Right    leg  . GALLBLADDER SURGERY  1995  . JOINT REPLACEMENT Left    knee  . NASAL SINUS SURGERY  2005  . PARTIAL HYSTERECTOMY  1983  . TEE WITHOUT CARDIOVERSION N/A 10/11/2018   Procedure: TRANSESOPHAGEAL ECHOCARDIOGRAM (TEE);  Surgeon: Dorothy Spark, MD;  Location: [facility] ENDOSCOPY;  Service: Cardiovascular;  Laterality: N/A;  . THROAT SURGERY  2005  . TOTAL KNEE ARTHROPLASTY Right 09/12/2014   Procedure: TOTAL KNEE ARTHROPLASTY;  Surgeon: Meredith Pel, MD;  Location: [facility];  Service: Orthopedics;  Laterality: Right;     OB History   No obstetric history on file.      Home Medications    Prior to Admission medications   Medication Sig Start Date End Date Taking? Authorizing Provider  apixaban (ELIQUIS) 5 MG TABS tablet Take 1 tablet (5 mg total) by mouth 2 (two) times daily. 09/28/18  Yes Danford, Suann Larry, MD  ARTIFICIAL TEAR SOLUTION OP Place 1 drop into both eyes daily as needed (dry eyes).   Yes [provider]  buPROPion (WELLBUTRIN XL) 300 MG 24 hr tablet Take 1 tablet (300 mg total) by mouth daily. 01/22/17  Yes Dohmeier, Asencion Partridge, MD  cetirizine (ZYRTEC) 10 MG tablet Take 10 mg by mouth daily as needed for allergies.   Yes [provider]  clonazePAM (KLONOPIN) 0.5 MG tablet Take 0.5 mg by mouth 2 (two) times daily.   Yes [provider]  fluticasone (FLONASE) 50 MCG/ACT nasal spray Place 1 spray into both nostrils daily as needed for allergies.  08/20/17  Yes [provider]  levothyroxine (SYNTHROID, LEVOTHROID) 25 MCG tablet Take 25 mcg by mouth daily before breakfast.    Yes [provider]  metFORMIN (GLUCOPHAGE-XR) 500 MG 24 hr tablet Take 500 mg by mouth 2 (two) times daily.   Yes [provider]  montelukast (SINGULAIR) 10 MG tablet Take 10 mg by mouth daily as needed (allergies).  12/28/17  Yes [provider]  omeprazole (PRILOSEC) 20 MG capsule Take 20 mg by mouth every evening.   Yes [provider]  oxybutynin (DITROPAN-XL) 10 MG 24 hr tablet Take 10 mg by mouth every morning.  06/17/17  Yes [provider]  pravastatin (PRAVACHOL) 40 MG tablet Take 40 mg by mouth at bedtime.    Yes [provider]  traZODone (DESYREL) 100 MG tablet Take 100 mg by mouth at bedtime.    Yes [provider]  venlafaxine (EFFEXOR) 50 MG tablet Take 2 tablets (100 mg total) by mouth daily. 11/22/15  Yes Dohmeier, Asencion Partridge, MD    Family  History Family History  Problem Relation Age of Onset  . Cancer Mother        panceratic  . Cancer - Colon Mother   . Breast cancer Maternal Aunt     Social History Social History   Tobacco Use  . Smoking status: Never Smoker  . Smokeless tobacco: Never Used  Substance Use Topics  . Alcohol use: No    Comment: socially  . Drug use: No     Allergies   Aciphex [rabeprazole sodium]; Coumadin [warfarin sodium]; Erythromycin; Lactose intolerance (gi); Macrodantin; Nsaids; Atorvastatin; Sulfa antibiotics; and Tape   Review of Systems Review of Systems  Constitutional: Positive for activity change, appetite change and fatigue. Negative for chills and fever.  HENT: Negative for ear pain and sore throat.   Eyes: Negative for pain and visual disturbance.  Respiratory: Positive for shortness of breath. Negative for cough.   Cardiovascular: Negative for chest pain and palpitations.  Gastrointestinal: Positive for nausea and vomiting. Negative for abdominal pain.  Genitourinary: Negative for difficulty urinating, dysuria, frequency, hematuria and vaginal bleeding.  Musculoskeletal: Negative for arthralgias and back pain.  Skin: Negative for color change and rash.  Neurological: Positive for weakness and light-headedness. Negative for dizziness, seizures and syncope.  All other systems reviewed and are negative.    Physical Exam Updated Vital Signs BP 131/67 (BP Location: Right Arm)   Pulse (!) 140   Temp (!) 97.4 F (36.3 C) (Oral)   Resp (!) 33   Wt 77.1 kg   SpO2 98%   BMI 29.18 kg/m   Physical Exam Vitals signs and nursing note reviewed.  Constitutional:      General: She is in acute distress.     Appearance: Normal appearance. She is well-developed. She is ill-appearing.  HENT:     Head: Normocephalic and atraumatic.     Mouth/Throat:     Lips: Pink.     Mouth: Mucous membranes are dry.  Eyes:     Conjunctiva/sclera: Conjunctivae normal.  Neck:      Musculoskeletal: Neck supple.  Cardiovascular:     Rate and Rhythm: Tachycardia present. Rhythm regularly irregular.     Heart sounds: S1 normal and S2 normal. No murmur.     Comments: Radial pulses difficult to palpate on exam.  Brachial and central pulses palpable on initial exam. Pulmonary:     Effort: Pulmonary effort is normal. No respiratory distress.     Breath sounds: Normal breath sounds. No decreased breath sounds, wheezing or rhonchi.  Abdominal:     General: Abdomen is flat.     Palpations: Abdomen is soft.     Tenderness: There is no abdominal tenderness. There is no guarding or rebound. Negative signs include Murphy's sign, Rovsing's sign, McBurney's sign, psoas sign and obturator sign.  Skin:    General: Skin is cool and dry.     Capillary Refill: Capillary refill takes more than 3 seconds.     Coloration: Skin is pale.  Neurological:     General: No focal deficit present.     Mental Status: She is alert and oriented to person, place, and time.     GCS: GCS eye subscore is 4. GCS verbal subscore is 5. GCS motor subscore is 6.     Cranial Nerves: Cranial nerves are intact.     Sensory: Sensation is intact.  Psychiatric:        Behavior: Behavior is cooperative.      ED Treatments / Results  Labs (all labs ordered are listed, but only abnormal results are displayed) Labs Reviewed  CBC WITH DIFFERENTIAL/PLATELET - Abnormal; Notable for the following components:      Result Value   WBC 15.2 (*)    MCHC 29.7 (*)    Neutro Abs 12.2 (*)    Monocytes Absolute 1.8 (*)    Abs Immature Granulocytes 0.13 (*)    All other components within normal limits  COMPREHENSIVE METABOLIC PANEL - Abnormal; Notable for the following components:   Potassium 5.7 (*)    CO2 11 (*)    BUN 29 (*)    Creatinine, Ser 1.91 (*)  Total Protein 6.3 (*)    AST 3,257 (*)    ALT 1,690 (*)    Total Bilirubin 3.6 (*)    GFR calc non Af Amer 26 (*)    GFR calc Af Amer 30 (*)    Anion gap 24  (*)    All other components within normal limits  PROTIME-INR - Abnormal; Notable for the following components:   Prothrombin Time 57.9 (*)    INR 6.81 (*)    All other components within normal limits  LACTIC ACID, PLASMA - Abnormal; Notable for the following components:   Lactic Acid, Venous 10.4 (*)    All other components within normal limits  ACETAMINOPHEN LEVEL - Abnormal; Notable for the following components:   Acetaminophen (Tylenol), Serum <10 (*)    All other components within normal limits  BILIRUBIN, DIRECT - Abnormal; Notable for the following components:   Bilirubin, Direct 1.6 (*)    All other components within normal limits  CK - Abnormal; Notable for the following components:   Total CK 1,150 (*)    All other components within normal limits  GLUCOSE, CAPILLARY - Abnormal; Notable for the following components:   Glucose-Capillary 109 (*)    All other components within normal limits  POCT I-STAT EG7 - Abnormal; Notable for the following components:   pH, Ven 7.198 (*)    pCO2, Ven 24.6 (*)    Bicarbonate 9.6 (*)    TCO2 10 (*)    Acid-base deficit 17.0 (*)    Potassium 5.6 (*)    Calcium, Ion 1.02 (*)    All other components within normal limits  URINE CULTURE  CULTURE, BLOOD (ROUTINE X 2)  CULTURE, BLOOD (ROUTINE X 2)  MRSA PCR SCREENING  MAGNESIUM  LIPASE, BLOOD  ETHANOL  TSH  URINALYSIS, ROUTINE W REFLEX MICROSCOPIC  LACTIC ACID, PLASMA  HEPATITIS PANEL, ACUTE  BLOOD GAS, VENOUS  COMPREHENSIVE METABOLIC PANEL  CBC  BASIC METABOLIC PANEL  MAGNESIUM  HEPATITIS A ANTIBODY, TOTAL  HEPATITIS B CORE ANTIBODY, TOTAL  HEPATITIS B E ANTIGEN  HEPATITIS B SURFACE ANTIGEN  HEPATITIS PANEL, ACUTE  HEPATIC FUNCTION PANEL  PROTIME-INR  APTT  LACTIC ACID, PLASMA  CK  CBG MONITORING, ED  PREPARE RBC (CROSSMATCH)  TYPE AND SCREEN    EKG EKG Interpretation  Date/Time:  Thursday December 09 2018 16:15:26 EST Ventricular Rate:  122 PR Interval:    QRS  Duration: 104 QT Interval:  348 QTC Calculation: 496 R Axis:   123 Text Interpretation:  Atrial fibrillation Low voltage, precordial leads Probable right ventricular hypertrophy Borderline T abnormalities, diffuse leads Borderline prolonged QT interval No significant change since last tracing Confirmed by Wandra Arthurs [phone]) on 12/09/2018 4:48:54 PM   Radiology Ct Abdomen Pelvis Wo Contrast  Result Date: 12/09/2018 CLINICAL DATA:  Nausea, vomiting, weakness and dizziness 5 days. EXAM: CT ABDOMEN AND PELVIS WITHOUT CONTRAST TECHNIQUE: Multidetector CT imaging of the abdomen and pelvis was performed following the standard protocol without IV contrast. COMPARISON:  03/11/2017 and 10/11/2014 and MRI lumbar spine 07/30/2017 FINDINGS: Lower chest: Lung bases demonstrate a tiny amount of right pleural fluid with subtle hazy density over the posterior right base likely atelectasis. Mild cardiomegaly. Hepatobiliary: Previous cholecystectomy. Liver and biliary tree are within normal. Pancreas: Subtle hazy attenuation adjacent the head of the pancreas/common bile duct. Findings could be due to mild acute pancreatitis. Spleen: Normal. Adrenals/Urinary Tract: Adrenal glands are normal. Kidneys normal in size and demonstrate no hydronephrosis or nephrolithiasis. There are  several bilateral cysts unchanged. Ureters and bladder are normal. Stomach/Bowel: Stomach and small bowel are normal. Appendix is normal. Colon is normal. Vascular/Lymphatic: Minimal calcified plaque over the abdominal aorta. No adenopathy. Reproductive: Previous hysterectomy. Other: None. Musculoskeletal: Degenerative change of the spine. Known mild L4 compression fracture. IMPRESSION: Subtle hazy attenuation of the peripancreatic fat in the region of the head of the pancreas/common bile duct which could be seen due to mild acute pancreatitis. Recommend clinical correlation. Small right pleural effusion with mild dependent right basilar atelectasis.  Bilateral renal cysts unchanged. Aortic Atherosclerosis (ICD10-I70.0). Stable L4 compression fracture. Electronically Signed   By: Marin Olp M.D.   On: 12/09/2018 18:57   Dg Chest Portable 1 View  Result Date: 12/09/2018 CLINICAL DATA:  Nausea, vomiting, weakness, and dizziness for 5 days, history atrial fibrillation, hypertension, diabetes mellitus, asthma, pneumonia EXAM: PORTABLE CHEST 1 VIEW COMPARISON:  Portable exam 1633 hours compared to 11/14/2015 FINDINGS: Enlargement of cardiac silhouette. Mediastinal contours and pulmonary vascularity normal. Lungs clear. No pleural effusion or pneumothorax. Bones unremarkable. IMPRESSION: Mild enlargement of cardiac silhouette. No acute abnormalities. Electronically Signed   By: Lavonia Dana M.D.   On: 12/09/2018 16:48    Procedures Procedures (including critical care time)  Medications Ordered in ED Medications  acetylcysteine (ACETADOTE) 40,000 mg in dextrose 5 % 1,000 mL (40 mg/mL) infusion (15 mg/kg/hr  77.1 kg Intravenous Rate/Dose Verify 12/09/18 2300)  sodium bicarbonate 150 mEq in dextrose 5 % 1,000 mL infusion ( Intravenous Rate/Dose Verify 12/09/18 2300)  cefTRIAXone (ROCEPHIN) 1 g in sodium chloride 0.9 % 100 mL IVPB (has no administration in time range)  metroNIDAZOLE (FLAGYL) IVPB 500 mg (has no administration in time range)  lactulose (CHRONULAC) 10 GM/15ML solution 30 g (30 g Oral Given 12/09/18 2324)  levothyroxine (SYNTHROID, LEVOTHROID) tablet 25 mcg (has no administration in time range)  insulin aspart (novoLOG) injection 0-9 Units (0 Units Subcutaneous Not Given 12/09/18 2340)  diltiazem (CARDIZEM) 100 mg in dextrose 5% 152mL (1 mg/mL) infusion (5 mg/hr Intravenous New Bag/Given 12/09/18 2350)  insulin aspart (novoLOG) injection 1-3 Units (1 Units Subcutaneous Not Given 12/09/18 2340)  sodium chloride 0.9 % bolus 1,000 mL (0 mLs Intravenous Stopped 12/09/18 1740)  lactated ringers bolus 1,000 mL (0 mLs Intravenous Stopped 12/09/18  1745)  0.9 %  sodium chloride infusion (0 mL/hr Intravenous Stopped 12/09/18 2155)  cefTRIAXone (ROCEPHIN) 1 g in sodium chloride 0.9 % 100 mL IVPB (0 g Intravenous Stopped 12/09/18 2103)  metroNIDAZOLE (FLAGYL) IVPB 500 mg (0 mg Intravenous Stopped 12/09/18 2103)  phytonadione (VITAMIN K) tablet 2.5 mg (2.5 mg Oral Given 12/09/18 1932)  lactated ringers bolus 500 mL (0 mLs Intravenous Stopped 12/09/18 2106)  acetylcysteine (ACETADOTE) 40 mg/mL load via infusion 11,565 mg (11,565 mg Intravenous Bolus from Bag 12/09/18 2154)     Initial Impression / Assessment and Plan / ED Course  I have reviewed the triage vital signs and the nursing notes.  Pertinent labs & imaging results that were available during my care of the patient were reviewed by me and considered in my medical decision making (see chart for details).      MDM:  Imaging: This x-ray shows mild enlargement of the cardiac silhouette with no acute abnormality.  CT abdomen pelvis without contrast shows subtle hazy attenuation of peripancreatic fat in the region of the head of the pancreas and common bile duct which could be seen due to mild acute pancreatitis.  Small right pleural effusion.  ED Provider Interpretation of EKG:  A. fib with a rate of 123 bpm, right axis deviation, no ST segment elevation or depression, pathologic T wave changes.  QTC prolonged at 490.  No delta wave.  Labs: INR 6.8, lipase 22, mag 2.0, CMP with a potassium of 5.7, creatinine 1.9, BUN 29, CO2 11 and anion gap of 24, AST 3257, ALT 1690.  CBC with a white count of 15.2 and 80% PMN.  Direct bilirubin 1.6.  TSH WNL.  Hepatitis panel pending.  Ethanol and acetaminophen negative. CK 1150.  Lactic acid 10.  His blood gas of 7.19/24/41/9.6  On initial evaluation, patient appears very ill. Afebrile and hypertensive with initial blood pressures reportedly in the 70s for EMS.  Difficult to palpate a radial pulse on arrival but central pulses are present.  Manual blood  pressure attempted twice prior to fluid resuscitation but not obtained..  Slightly sleepy but mostly alert, easily arousable, and oriented x4, pleasant, and cooperative.  Presents with nausea and vomiting with lightheadedness and weakness as detailed above.  On exam, patient appears slightly pale versus jaundiced.  No scleral icterus.  Initially considered transfusing emergent blood products given her history of blood transfusion in the past and her current presentation.  However, she had not yet received fluids and had lots of GI losses reported at home the last few days with no reported source of blood loss.  In 2015 she had retroperitoneal hematoma and was supratherapeutic on her Coumadin with chart review showing an INR of around 8.  She denies abdominal pain and groin pain which she had previously at that time.  Takes only Eliquis.  Appeared very volume down on exam and suspected symptomatic dehydration/hypovolemic shock. Given 1L IV NS on pressure bag followed by 1.5 L LR thereafter with MAP 60s-70s. No chest pain.   Labs with evidence for acute hepatitis with evidence for hepatic failure (INR 6.8).  Significant metabolic acidosis with a CO2 of 11 and anion gap 24.  Lactate 10.4 as above. VBG Bolick acidosis and respiratory compensation as above.  Code sepsis initiated patient was given Rocephin and Flagyl for suspected abdominal source.  Hepatitis panel collected.  Blood pressure improved thereafter and EKG as above showed atrial fibrillation with RVR with no evidence for ischemic changes.  Denies chest pain and no troponin indicated at this time with low suspicion for ACS.  No additional rate control medications given initially in setting of patient's response to fluids and apparent shock.  Low suspicion for PE given her current lack of tachypnea, chest pain, and sats of 96%.  Patient's labs are consistent with acute liver failure.  Multiple etiologies on the differential.  CT scan of the abdomen  pelvis without contrast shows no acute etiology only nonspecific findings as detailed above.  Does not drink alcohol and adamantly denies Tylenol use.  Both levels negative above.  Bilirubin minimally elevated.  No evidence for obstructive pathology including a sending cholangitis.  There was concern that this patient may potentially require liver transplant although she has medical comorbidities.  Consulted our ICU who recommended discussing with [facility] or [facility].  Patient had no preference and [facility] was contacted.  I spoke to Dr. Ahmed Prima with transplant surgery at 8:07 PM.  He recommended starting N-acetylcysteine bolus and infusion regardless of possibility of acetaminophen exposure.  Recommended transfer to the medical ICU at [facility].  I then spoke to Dr. Radene Knee at 8:24 PM with the MICU at [facility].  She agreed with the work-up and plan to transfer.  However,  patient has 3 or 4 additional patients in front of her awaiting a bed in their MICU.  Plan to admit to our MICU in the interim.  Poison control was consulted to further investigate possible causes and there is no clear etiology based on her home meds or other presentation at this time.  Given her adequate volume resuscitation repeat CMP was ordered and pending.  Found to have rhabdomyolysis.  Working diagnosis time of admission was likely shock liver with unknown etiology of shock.  Not requiring pressors.  Maintain her blood pressure and heart rate maintained in the 130s to low 150s with A. fib with RVR.  Low-dose diltiazem infusion without bolus started in hopes of encouraging atrial kick.  Transferred to the MICU thereafter.  The plan for this patient was discussed with Dr. Darl Householder who voiced agreement and who oversaw evaluation and treatment of this patient.   The patient was fully informed and involved with the history taking, evaluation, workup including labs/images, and plan. The patient's concerns and questions were addressed to the patient's satisfaction and she  expressed agreement with the plan to admit to our ICU with plan to transfer to MICU at [facility] when bed available.  Final Clinical Impressions(s) / ED Diagnoses   Final diagnoses:  Acute liver failure without hepatic coma  Acute kidney injury ([place])  Hypovolemic shock (HCC)  Lactic acidosis  Non-intractable vomiting with nausea, unspecified vomiting type  Atrial fibrillation with RVR (HCC)  Transaminitis  Metabolic acidosis  Non-traumatic rhabdomyolysis    ED Discharge Orders    None       Olegario Emberson, Rodena Goldmann, MD 12/10/18 0016    Drenda Freeze, MD 12/11/18 [phone]

## 2018-12-10 ENCOUNTER — Inpatient Hospital Stay (HOSPITAL_COMMUNITY): Payer: Medicare Other

## 2018-12-10 DIAGNOSIS — J9601 Acute respiratory failure with hypoxia: Secondary | ICD-10-CM

## 2018-12-10 DIAGNOSIS — I4819 Other persistent atrial fibrillation: Secondary | ICD-10-CM

## 2018-12-10 LAB — COMPREHENSIVE METABOLIC PANEL
ALT: 1822 U/L — ABNORMAL HIGH (ref 0–44)
AST: 2772 U/L — ABNORMAL HIGH (ref 15–41)
Albumin: 3.1 g/dL — ABNORMAL LOW (ref 3.5–5.0)
Alkaline Phosphatase: 96 U/L (ref 38–126)
Anion gap: 23 — ABNORMAL HIGH (ref 5–15)
BUN: 29 mg/dL — ABNORMAL HIGH (ref 8–23)
CO2: 12 mmol/L — ABNORMAL LOW (ref 22–32)
Calcium: 8.8 mg/dL — ABNORMAL LOW (ref 8.9–10.3)
Chloride: 103 mmol/L (ref 98–111)
Creatinine, Ser: 1.78 mg/dL — ABNORMAL HIGH (ref 0.44–1.00)
GFR calc Af Amer: 32 mL/min — ABNORMAL LOW (ref >60)
GFR calc non Af Amer: 28 mL/min — ABNORMAL LOW (ref >60)
Glucose, Bld: 146 mg/dL — ABNORMAL HIGH (ref 70–99)
Potassium: 5.2 mmol/L — ABNORMAL HIGH (ref 3.5–5.1)
Sodium: 138 mmol/L (ref 135–145)
TOTAL PROTEIN: 5.5 g/dL — AB (ref 6.5–8.1)
Total Bilirubin: 3.2 mg/dL — ABNORMAL HIGH (ref 0.3–1.2)

## 2018-12-10 LAB — CBC
HCT: 32.5 % — ABNORMAL LOW (ref 36.0–46.0)
Hemoglobin: 10 g/dL — ABNORMAL LOW (ref 12.0–15.0)
MCH: 27.9 pg (ref 26.0–34.0)
MCHC: 30.8 g/dL (ref 30.0–36.0)
MCV: 90.5 fL (ref 80.0–100.0)
Platelets: 197 10*3/uL (ref 150–400)
RBC: 3.59 MIL/uL — AB (ref 3.87–5.11)
RDW: 14.4 % (ref 11.5–15.5)
WBC: 18.1 10*3/uL — ABNORMAL HIGH (ref 4.0–10.5)
nRBC: 0.1 % (ref 0.0–0.2)

## 2018-12-10 LAB — LACTIC ACID, PLASMA
Lactic Acid, Venous: 9.3 mmol/L (ref 0.5–1.9)
Lactic Acid, Venous: 6.2 mmol/L (ref 0.5–1.9)
Lactic Acid, Venous: 6.8 mmol/L (ref 0.5–1.9)
Lactic Acid, Venous: 4.1 mmol/L (ref 0.5–1.9)

## 2018-12-10 LAB — URINALYSIS, ROUTINE W REFLEX MICROSCOPIC
Bacteria, UA: NONE SEEN
Bilirubin Urine: NEGATIVE
Glucose, UA: NEGATIVE mg/dL
Ketones, ur: 20 mg/dL — AB
Leukocytes, UA: NEGATIVE
Nitrite: NEGATIVE
Protein, ur: 100 mg/dL — AB
Specific Gravity, Urine: 1.021 (ref 1.005–1.030)
pH: 5 (ref 5.0–8.0)

## 2018-12-10 LAB — BASIC METABOLIC PANEL
Anion gap: 21 — ABNORMAL HIGH (ref 5–15)
BUN: 32 mg/dL — ABNORMAL HIGH (ref 8–23)
CO2: 14 mmol/L — ABNORMAL LOW (ref 22–32)
Calcium: 8.4 mg/dL — ABNORMAL LOW (ref 8.9–10.3)
Chloride: 102 mmol/L (ref 98–111)
Creatinine, Ser: 1.63 mg/dL — ABNORMAL HIGH (ref 0.44–1.00)
GFR calc Af Amer: 36 mL/min — ABNORMAL LOW (ref >60)
GFR calc non Af Amer: 31 mL/min — ABNORMAL LOW (ref >60)
Glucose, Bld: 224 mg/dL — ABNORMAL HIGH (ref 70–99)
Potassium: 4 mmol/L (ref 3.5–5.1)
SODIUM: 137 mmol/L (ref 135–145)

## 2018-12-10 LAB — POCT I-STAT 7, (LYTES, BLD GAS, ICA,H+H)
Acid-base deficit: 14 mmol/L — ABNORMAL HIGH (ref 0.0–2.0)
Bicarbonate: 11.3 mmol/L — ABNORMAL LOW (ref 20.0–28.0)
Calcium, Ion: 1.1 mmol/L — ABNORMAL LOW (ref 1.15–1.40)
HCT: 34 % — ABNORMAL LOW (ref 36.0–46.0)
Hemoglobin: 11.6 g/dL — ABNORMAL LOW (ref 12.0–15.0)
O2 Saturation: 96 %
Patient temperature: 98
Potassium: 4.5 mmol/L (ref 3.5–5.1)
Sodium: 135 mmol/L (ref 135–145)
TCO2: 12 mmol/L — ABNORMAL LOW (ref 22–32)
pCO2 arterial: 23.1 mmHg — ABNORMAL LOW (ref 32.0–48.0)
pH, Arterial: 7.295 — ABNORMAL LOW (ref 7.350–7.450)
pO2, Arterial: 86 mmHg (ref 83.0–108.0)

## 2018-12-10 LAB — MAGNESIUM: Magnesium: 1.7 mg/dL (ref 1.7–2.4)

## 2018-12-10 LAB — AMMONIA: Ammonia: 44 umol/L — ABNORMAL HIGH (ref 9–35)

## 2018-12-10 LAB — PROTIME-INR
INR: >10
INR: >10
INR: 9.75
PROTHROMBIN TIME: 76.5 s — AB (ref 11.4–15.2)
Prothrombin Time: 79.7 seconds — ABNORMAL HIGH (ref 11.4–15.2)

## 2018-12-10 LAB — GLUCOSE, CAPILLARY
GLUCOSE-CAPILLARY: 194 mg/dL — AB (ref 70–99)
GLUCOSE-CAPILLARY: 244 mg/dL — AB (ref 70–99)
Glucose-Capillary: 109 mg/dL — ABNORMAL HIGH (ref 70–99)
Glucose-Capillary: 166 mg/dL — ABNORMAL HIGH (ref 70–99)
Glucose-Capillary: 194 mg/dL — ABNORMAL HIGH (ref 70–99)
Glucose-Capillary: 175 mg/dL — ABNORMAL HIGH (ref 70–99)
Glucose-Capillary: 185 mg/dL — ABNORMAL HIGH (ref 70–99)

## 2018-12-10 LAB — HEPATIC FUNCTION PANEL
ALT: 1603 U/L — ABNORMAL HIGH (ref 0–44)
AST: 2934 U/L — ABNORMAL HIGH (ref 15–41)
Albumin: 3.8 g/dL (ref 3.5–5.0)
Alkaline Phosphatase: 75 U/L (ref 38–126)
Bilirubin, Direct: 1.2 mg/dL — ABNORMAL HIGH (ref 0.0–0.2)
Indirect Bilirubin: 1.7 mg/dL — ABNORMAL HIGH (ref 0.3–0.9)
Total Bilirubin: 2.9 mg/dL — ABNORMAL HIGH (ref 0.3–1.2)
Total Protein: 5.8 g/dL — ABNORMAL LOW (ref 6.5–8.1)

## 2018-12-10 LAB — HEPATITIS PANEL, ACUTE
HCV Ab: <0.1 s/co ratio (ref 0.0–0.9)
Hep A IgM: NEGATIVE
Hep B C IgM: NEGATIVE
Hepatitis B Surface Ag: NEGATIVE

## 2018-12-10 LAB — APTT: aPTT: 45 seconds — ABNORMAL HIGH (ref 24–36)

## 2018-12-10 LAB — MRSA PCR SCREENING: MRSA by PCR: NEGATIVE

## 2018-12-10 LAB — CK: Total CK: 2661 U/L — ABNORMAL HIGH (ref 38–234)

## 2018-12-10 MED ORDER — LACTULOSE 10 GM/15ML PO SOLN
20.0000 g | Freq: Two times a day (BID) | ORAL | Status: DC
  Administered 2018-12-10 – 2018-12-11 (×3): 20 g via ORAL
  Filled 2018-12-10 (×3): qty 30

## 2018-12-10 MED ORDER — ORAL CARE MOUTH RINSE
15.0000 mL | Freq: Two times a day (BID) | OROMUCOSAL | Status: DC
  Administered 2018-12-10 – 2018-12-21 (×21): 15 mL via OROMUCOSAL

## 2018-12-10 MED ORDER — METOPROLOL TARTRATE 50 MG PO TABS: 50.0000 mg | ORAL_TABLET | Freq: Two times a day (BID) | ORAL | Status: DC

## 2018-12-10 MED ORDER — LACTATED RINGERS IV SOLN
INTRAVENOUS | Status: AC
  Administered 2018-12-10 (×2): via INTRAVENOUS

## 2018-12-10 MED ORDER — DEXTROSE 5 % IV SOLN
15.0000 mg/kg/h | INTRAVENOUS | Status: DC
  Administered 2018-12-10 – 2018-12-11 (×2): 15 mg/kg/h via INTRAVENOUS
  Filled 2018-12-10 (×5): qty 200

## 2018-12-10 MED ORDER — TRAMADOL HCL 50 MG PO TABS
50.0000 mg | ORAL_TABLET | Freq: Two times a day (BID) | ORAL | Status: DC | PRN
  Administered 2018-12-10 – 2018-12-17 (×8): 50 mg via ORAL
  Filled 2018-12-10 (×10): qty 1

## 2018-12-10 MED ORDER — ACETYLCYSTEINE LOAD VIA INFUSION: 150.0000 mg/kg | Freq: Once | INTRAVENOUS | Status: DC

## 2018-12-10 MED ORDER — PHYTONADIONE 5 MG PO TABS
5.0000 mg | ORAL_TABLET | Freq: Once | ORAL | Status: AC
  Administered 2018-12-10: 5 mg via ORAL
  Filled 2018-12-10 (×3): qty 1

## 2018-12-10 MED ORDER — METOPROLOL TARTRATE 50 MG PO TABS
50.0000 mg | ORAL_TABLET | Freq: Two times a day (BID) | ORAL | Status: DC
  Administered 2018-12-10 – 2018-12-21 (×22): 50 mg via ORAL
  Filled 2018-12-10 (×22): qty 1

## 2018-12-10 MED ORDER — ALBUMIN HUMAN 5 % IV SOLN
50.0000 g | Freq: Once | INTRAVENOUS | Status: AC
  Administered 2018-12-10: 50 g via INTRAVENOUS
  Filled 2018-12-10: qty 250

## 2018-12-10 MED ORDER — METOPROLOL TARTRATE 5 MG/5ML IV SOLN
5.0000 mg | Freq: Every day | INTRAVENOUS | Status: DC
  Administered 2018-12-10 – 2018-12-12 (×9): 5 mg via INTRAVENOUS
  Filled 2018-12-10 (×9): qty 5

## 2018-12-10 MED ORDER — METOPROLOL TARTRATE 5 MG/5ML IV SOLN: 2.5000 mg | Freq: Once | INTRAVENOUS | Status: DC

## 2018-12-10 MED ORDER — POLYETHYLENE GLYCOL 3350 17 G PO PACK
17.0000 g | PACK | Freq: Every day | ORAL | Status: DC | PRN
  Administered 2018-12-12: 17 g via ORAL
  Filled 2018-12-10: qty 1

## 2018-12-10 NOTE — Procedures (Signed)
Central Venous Catheter Insertion Procedure Note ROSAMAE ROCQUE [id number] May 15, 1946  Procedure: Insertion of Central Venous Catheter Indications: Assessment of intravascular volume, Drug and/or fluid administration and Frequent blood sampling  Procedure Details Consent: Risks of procedure as well as the alternatives and risks of each were explained to the (patient/caregiver).  Consent for procedure obtained. Time Out: Verified patient identification, verified procedure, site/side was marked, verified correct patient position, special equipment/implants available, medications/allergies/relevent history reviewed, required imaging and test results available.  Performed  Maximum sterile technique was used including antiseptics, cap, gloves, gown, hand hygiene, mask and sheet. Skin prep: Chlorhexidine; local anesthetic administered A antimicrobial bonded/coated triple lumen catheter was placed in the left internal jugular vein using the Seldinger technique. Catheter placed to 20 cm. Blood aspirated via all 3 ports and then flushed x 3. Line sutured x 2 and dressing applied.  Ultrasound guidance used.Yes.    Evaluation Blood flow good Complications: No apparent complications Patient did tolerate procedure well. Chest X-ray ordered to verify placement.  CXR: pending.   Georgann Housekeeper, AGACNP-BC [place] Pager [phone] or [phone]  12/10/2018 4:21 AM

## 2018-12-10 NOTE — Progress Notes (Signed)
CSW acknowledges consult. CSW called and spoke with the RN, patient will be transferred to [facility] to receive a liver transplant.   CSW does not assist with coordinating a hospital to hospital transport.   CSW signing off.   Domenic Schwab, MSW, [place]

## 2018-12-10 NOTE — Progress Notes (Signed)
CRITICAL VALUE ALERT  Critical Value:  Lactic acid 6.2  Date & Time Notified:  12/10/2018 @0655   Provider Notified: Johnny Bridge  Orders Received/Actions taken: Lactic trending down. Will continue to monitor closely. Clint Bolder, RN 12/10/18 6:59 AM

## 2018-12-10 NOTE — Progress Notes (Signed)
NAME:  Amanda [name], MRN:  [id number], DOB:  1946/07/30, LOS: 1 ADMISSION DATE:  12/09/2018, CONSULTATION DATE:  1/23 REFERRING MD:  Dr. 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  History of present illness   73 year old female with past medical history as below, which is significant for atrial fibrillation on Eliquis, OSA on CPAP, hypertension, diabetes, and frequent falls.  Atrial fibrillation was her most recent diagnosis back in November 2019 and she was started on metoprolol and Eliquis at that time.  In reviewing her cardiology outpatient notes it seems that she is followed her medication regimen pretty strictly, however, in the months since her diagnosis she has frequently been in A. fib and has felt very "beat down"as a result.    Upon presenting to [facility] emergency department on 1/23 she complained of a 4 to 5-day history of nausea and vomiting.  Emesis described as clear mucus.  The symptoms did improve after the first 2 days, however, she continued to be weak and dizzy up until her presentation.  She denies having abdominal pain, hematemesis, diarrhea, constipation, hematochezia, cough, fevers, chills.  Describes normal bowel movements.  She seems to be a good historian and the history she gave seem to match up with chart review, however, she then told me she had been in the hospital for the last 2 days which is untrue.  She also describes having been on Eliquis for only a couple of weeks when it looks like this was started back in November 2019.   Laboratory evaluation in the emergency department was significant for AST 3257, ALT 1690, total bilirubin 3.6, CK 1150, lactic acid 10.4, potassium 5.7, bicarbonate 11, WBC 15.2, INR 6.81.  CT scan of the abdomen and pelvis demonstrated a possible mild pancreatitis with a small right pleural effusion.  She was treated  with ceftriaxone and Flagyl.  And was loaded with acetylcysteine and continued on an infusion.  Given the critical nature of her illness PCCM was asked to admit.  The emergency physician has contacted the hospital to [facility] for transfer.  She has been accepted, but is awaiting bed assignment.   Past Medical History   has a past medical history of A-fib ([place]), Anemia due to blood loss, Anxiety, Arthritis, Asthma, Complication of anesthesia, Depression, Diabetes type 2, controlled (), GERD (gastroesophageal reflux disease), Headache, Heart murmur, Hepatitis, Hyperlipidemia, Hypertension, Hypothyroidism, Mitral valve prolapse, OSA on CPAP, Pneumonia, and Vertigo.  Significant Hospital Events   1/23 admit  Consults:    Procedures:    Significant Diagnostic Tests:  CT abd, pelv 1/23 > Subtle hazy attenuation of the peripancreatic fat in the region of the head of the pancreas/common bile duct which could be seen due to mild acute pancreatitis. Recommend clinical correlation. Small right pleural effusion with mild dependent right basilar atelectasis. Bilateral renal cysts unchanged.   Micro Data:  Blood 1/23 > Urine 1/23 >  Antimicrobials:  CTX 1/23 > Flagyl 1/23 >  Interim history/subjective:    Objective   Blood pressure (!) 148/124, pulse (!) 136, temperature 97.6 F (36.4 C), temperature source Axillary, resp. rate (!) 30, weight 77.1 kg, SpO2 97 %.        Intake/Output Summary (Last 24 hours) at 12/10/2018 1416 Last data filed at 12/10/2018 1300 Gross per 24 hour  Intake 5216.23 ml  Output 300 ml  Net 4916.23 ml   Filed Weights   12/09/18 2000  Weight: 77.1 kg    Examination: General: Well developed, elderly female in no acute distress HENT: /At, PERRL, No appreciable JVD Lungs: Clear bilateral breath sounds Cardiovascular: Tachycardic, irregularly irregular, no MRG Abdomen: Soft, non-tender, non-distended. Hypoactive bowel sounds.    Extremities: No acute deformity or ROM limitation. No edema.  Neuro: Alert, oriented x 3 and non-focal.   Resolved Hospital Problem list      Assessment & Plan:   Hepatic failure: Etiology unclear. Associated hepatic coagulopathy. Acute hepatitis panel negative. - NAC loaded in ED, continue infusion - Lactulose - Trend LFT, Coags - Continue ceftriaxone and flagyl until intraabdominal infection can be ruled out.  - Awaiting transfer to [facility] -We will start autoimmune hepatitis work-up from tomorrow if patient is not already transferred.  AKI: likely secondary to rhabdomyolysis.  - Gentle hydration - Repeat CK  Hyperkalemia - bicarb infusion - Repeat BMP  Anion gap metabolic acidosis: Lactic, may be a small element of NAG due to GI losses. This is mostly if not all a gap acidosis.  - trend lactic - bicarb infusion  Possible pancreatitis on CT: Lipase on admission 22 - Supportive care.   - NPO  Atrial fibrillation with RVR. Patient is on diltiazem infusion, hard to control rate. - Telemetry monitoring - Gentle hydration - Monitor for now  DM - SSI  OSA on CPAP - hold CPAP due to vomiting  Hypothyroid TSH wnl - continue synthroid  Best practice:  Diet: Clear liquids-advance as tolerated. Pain/Anxiety/Delirium protocol (if indicated): na VAP protocol (if indicated): na DVT prophylaxis: Coagulopathic GI prophylaxis: not yet indicated Glucose control: SSI Mobility: BR  Code Status: FULL Family Communication: Patient updated.  Disposition: ICU, pending transfer to [facility].   Labs   CBC: Recent Labs  Lab 12/09/18 1613 12/09/18 1833 12/10/18 0216 12/10/18 0556  WBC 15.2*  --   --  18.1*  NEUTROABS 12.2*  --   --   --   HGB 13.0 13.3 11.6* 10.0*  HCT 43.8 39.0 34.0* 32.5*  MCV 93.4  --   --  90.5  PLT 294  --   --  161    Basic Metabolic Panel: Recent Labs  Lab 12/09/18 1613 12/09/18 1833 12/09/18 2355 12/10/18 0216 12/10/18 0556  NA 135 135 138  135 137  K 5.7* 5.6* 5.2* 4.5 4.0  CL 100  --  103  --  102  CO2 11*  --  12*  --  14*  GLUCOSE 81  --  146*  --  224*  BUN 29*  --  29*  --  32*  CREATININE 1.91*  --  1.78*  --  1.63*  CALCIUM 9.6  --  8.8*  --  8.4*  MG 2.0  --   --   --  1.7   GFR: Estimated Creatinine Clearance: 30.9 mL/min (A) (by C-G formula based on SCr of 1.63 mg/dL (H)). Recent Labs  Lab 12/09/18 1613 12/09/18 1805 12/09/18 2355 12/10/18 0548 12/10/18 0556  WBC 15.2*  --   --   --  18.1*  LATICACIDVEN  --  10.4* 9.3* 6.2*  --     Liver Function Tests: Recent Labs  Lab 12/09/18 1613 12/09/18 2355 12/10/18 0556  AST 3,257* 2,772* 2,934*  ALT 1,690* 1,822* 1,603*  ALKPHOS 111 96 75  BILITOT 3.6* 3.2* 2.9*  PROT 6.3* 5.5* 5.8*  ALBUMIN 3.8 3.1* 3.8   Recent Labs  Lab 12/09/18 1613  LIPASE 22   Recent Labs  Lab 12/10/18 0730  AMMONIA 44*    ABG    Component Value Date/Time   PHART 7.295 (L) 12/10/2018 0216   PCO2ART 23.1 (L) 12/10/2018 0216   PO2ART 86.0 12/10/2018 0216   HCO3 11.3 (L) 12/10/2018 0216   TCO2 12 (L) 12/10/2018 0216   ACIDBASEDEF 14.0 (H) 12/10/2018 0216   O2SAT 96.0 12/10/2018 0216     Coagulation Profile: Recent Labs  Lab 12/09/18 1613 12/10/18 0556  INR 6.81* >10.00*    Cardiac Enzymes: Recent Labs  Lab 12/09/18 1950 12/10/18 0556  CKTOTAL 1,150* 2,661*    HbA1C: Hgb A1c MFr Bld  Date/Time Value Ref Range Status  10/12/2014 10:14 AM 5.9 (H) <5.7 % Final    Comment:    (NOTE)                                                                       According to the ADA Clinical Practice Recommendations for 2011, when HbA1c is used as a screening test:  >=6.5%   Diagnostic of Diabetes Mellitus           (if abnormal result is confirmed) 5.7-6.4%   Increased risk of developing Diabetes Mellitus References:Diagnosis and Classification of Diabetes Mellitus,Diabetes ZYSA,6301,60(FUXNA 1):S62-S69 and Standards of Medical Care in         Diabetes -  2011,Diabetes Care,2011,34 (Suppl 1):S11-S61.     CBG: Recent Labs  Lab 12/09/18 1600 12/09/18 2310 12/10/18 0342 12/10/18 0737 12/10/18 1111  GLUCAP 74 109* 166* 194* 244*    Review of Systems:   Bolds are positive  Constitutional: weight loss, gain, night sweats, Fevers, chills, fatigue .  HEENT: headaches, Sore throat, sneezing, nasal congestion, post nasal drip, Difficulty swallowing, Tooth/dental problems, visual complaints visual changes, ear ache CV:  chest pain, radiates:,Orthopnea, PND, swelling in lower extremities, dizziness, palpitations, syncope.  GI  heartburn, indigestion, abdominal pain, nausea, vomiting, diarrhea, change in bowel habits, loss of appetite, bloody stools.  Resp: cough, productive:, hemoptysis, dyspnea, chest pain, pleuritic.  Skin: rash or itching or icterus GU: dysuria, change in color of urine, urgency or frequency. flank pain, hematuria  MS: joint pain or swelling. decreased range of motion  Psych: change in mood or affect. depression or anxiety.  Neuro: difficulty with speech, weakness, numbness, ataxia    Past Medical History  She,  has a past medical history of A-fib ([place]), Anemia due to blood loss, Anxiety, Arthritis, Asthma, Complication of anesthesia, Depression, Diabetes type 2, controlled ([place]), GERD (gastroesophageal reflux disease), Headache, Heart murmur, Hepatitis, Hyperlipidemia, Hypertension, Hypothyroidism, Mitral valve prolapse, OSA on CPAP, Pneumonia, and Vertigo.   Surgical History    Past Surgical History:  Procedure Laterality Date  . ABDOMINAL HYSTERECTOMY    . CARDIOVERSION N/A 10/11/2018   Procedure: CARDIOVERSION;  Surgeon: Dorothy Spark, MD;  Location[facility] ENDOSCOPY;  Service: Cardiovascular;  Laterality: N/A;  . CHOLECYSTECTOMY    . FRACTURE SURGERY Right    leg  . GALLBLADDER SURGERY  1995  . JOINT REPLACEMENT Left    knee  . NASAL SINUS SURGERY  2005  . PARTIAL HYSTERECTOMY  1983  . TEE WITHOUT  CARDIOVERSION N/A 10/11/2018   Procedure: TRANSESOPHAGEAL ECHOCARDIOGRAM (  TEE);  Surgeon: Dorothy Spark, MD;  Location: [place];  Service: Cardiovascular;  Laterality: N/A;  . THROAT SURGERY  2005  . TOTAL KNEE ARTHROPLASTY Right 09/12/2014   Procedure: TOTAL KNEE ARTHROPLASTY;  Surgeon: Meredith Pel, MD;  Location: [place];  Service: Orthopedics;  Laterality: Right;     Social History   reports that she has never smoked. She has never used smokeless tobacco. She reports that she does not drink alcohol or use drugs.   Family History   Her family history includes Breast cancer in her maternal aunt; Cancer in her mother; Cancer - Colon in her mother.   Allergies Allergies  Allergen Reactions  . Aciphex [Rabeprazole Sodium] Hives  . Coumadin [Warfarin Sodium] Other (See Comments)    Bleeding   . Erythromycin Nausea And Vomiting  . Lactose Intolerance (Gi) Diarrhea    Stomach pain  . Macrodantin Nausea And Vomiting  . Nsaids     Bleeding in bladder  . Atorvastatin Other (See Comments)    Body aches  . Sulfa Antibiotics Nausea Only and Other (See Comments)    Feel terrible  . Tape Rash     Home Medications  Prior to Admission medications   Medication Sig Start Date End Date Taking? Authorizing Provider  apixaban (ELIQUIS) 5 MG TABS tablet Take 1 tablet (5 mg total) by mouth 2 (two) times daily. 09/28/18  Yes Danford, Suann Larry, MD  ARTIFICIAL TEAR SOLUTION OP Place 1 drop into both eyes daily as needed (dry eyes).   Yes [provider]  buPROPion (WELLBUTRIN XL) 300 MG 24 hr tablet Take 1 tablet (300 mg total) by mouth daily. 01/22/17  Yes Dohmeier, Asencion Partridge, MD  cetirizine (ZYRTEC) 10 MG tablet Take 10 mg by mouth daily as needed for allergies.   Yes [provider]  clonazePAM (KLONOPIN) 0.5 MG tablet Take 0.5 mg by mouth 2 (two) times daily.   Yes [provider]  fluticasone (FLONASE) 50 MCG/ACT nasal spray Place 1 spray into both  nostrils daily as needed for allergies.  08/20/17  Yes [provider]  levothyroxine (SYNTHROID, LEVOTHROID) 25 MCG tablet Take 25 mcg by mouth daily before breakfast.    Yes [provider]  metFORMIN (GLUCOPHAGE-XR) 500 MG 24 hr tablet Take 500 mg by mouth 2 (two) times daily.   Yes [provider]  montelukast (SINGULAIR) 10 MG tablet Take 10 mg by mouth daily as needed (allergies).  12/28/17  Yes [provider]  omeprazole (PRILOSEC) 20 MG capsule Take 20 mg by mouth every evening.   Yes [provider]  oxybutynin (DITROPAN-XL) 10 MG 24 hr tablet Take 10 mg by mouth every morning.  06/17/17  Yes [provider]  pravastatin (PRAVACHOL) 40 MG tablet Take 40 mg by mouth at bedtime.    Yes [provider]  traZODone (DESYREL) 100 MG tablet Take 100 mg by mouth at bedtime.    Yes [provider]  venlafaxine (EFFEXOR) 50 MG tablet Take 2 tablets (100 mg total) by mouth daily. 11/22/15  Yes Dohmeier, Asencion Partridge, MD     Critical care time:     Lorella Nimrod PGY3 Pager [phone] or [phone]  12/10/2018 2:16 PM

## 2018-12-10 NOTE — Progress Notes (Signed)
CRITICAL VALUE ALERT  Critical Value:  ABG pH-7.29, CO2-23.4, HCO3-11.3  Date & Time Notified:  12/10/2018 @ 0220  Provider Notified: [facility]-Dr. Lucile Shutters  Orders Received/Actions taken: Bicarb and pH trending in the right direction, will continue to monitor closely.  Clint Bolder, RN 12/10/18 2:24 AM

## 2018-12-10 NOTE — Progress Notes (Signed)
[place] Progress Note Patient Name: Amanda [name] DOB: December 12, 1945 MRN: [id number]   Date of Service  12/10/2018  HPI/Events of Note  Pt admitted with liver failure and metabolic acidosis. Baseline PH 7.19. Pt needs follow up ABG. Lactate is 9.3   eICU Interventions  Follow up ABG testing now,  5 % Albumin  50 gm iv x 1 ordered now        [employer] 12/10/2018, 1:52 AM

## 2018-12-10 NOTE — Consult Note (Addendum)
Cardiology Consultation:   Patient ID: Amanda [name]; [id number]; 1946/06/26   Admit date: 12/09/2018 Date of Consult: 12/10/2018  Primary Care Provider: Aretta Nip, MD Primary Cardiologist: Dr. Ena Dawley, MD  Patient Profile:   Amanda [name] is a 73 y.o. female with a hx of hypertension, hyperlipidemia, hypothyroidism, atrial fibrillation s/p DCCV 09/2018 on Eliquis, OSA on CPAP and frequent falls who is being seen today for the evaluation of atrial fibrillation with RVR at the request of Dr. Reesa Chew.  History of Present Illness:   Amanda [name] is a 73 year old female with a history stated above who presented to [facility] on 12/09/2018 with a 4 to 5-day period of nausea, vomiting and weakness found to be in fulminant hepatic failure. She denied having abdominal pain, hematemesis, diarrhea or constipation.  Upon ED arrival, her AST was found to be markedly elevated at 3257 with an ALT of 1690 with a total bilirubin of 3.6, CK of 1150 and lactic acid of 10.4.  Additionally her potassium was 5.7 and bicarbonate 11 with an INR of 6.81. CT of her abdomen and pelvis demonstrated possible mild pancreatitis with small right pleural effusion.  She was treated with ceftriaxone and Flagyl. She was loaded with acetylcysteine and started on acetylcysteine gtt. She is currently awaiting transfer to [facility] for liver transplant.  She was last seen by our service 10/20/2018 in follow-up for AF. Of note she was admitted to [facility] 09/2018 after multiple falls and significant weight loss and was found to be in atrial fibrillation with RVR.  Her echocardiogram performed showed normal LV function with an LVEF of 55 to 60% with moderate mitral regurgitation, mild posterior leaflet prolapse, left atrium is moderately dilated and right ventricular systolic function is mildly reduced.  She was discharged on metoprolol 75 mg p.o. twice daily and Eliquis 5 mg twice daily with a plan  to outpatient cardiovert her 4 weeks after anticoagulation. She underwent a successful DCCV on 10/11/2018.  Unfortunately, she presented in follow-up and was having significant palpitations with dizziness and found to be back in atrial fibrillation after one day of NSR. Her metoprolol was increased to 100 mg twice daily and she was referred to the atrial fibrillation clinic for further management and possible consideration of antiarrythmic therapy.   Past Medical History:  Diagnosis Date  . A-fib ([place])   . Anemia due to blood loss   . Anxiety   . Arthritis   . Asthma    seasonal or with bronchitis  . Complication of anesthesia    "forgets to breathe" when she wakes up  . Depression   . Diabetes type 2, controlled ([place])    fasting 100-120  . GERD (gastroesophageal reflux disease)   . Headache   . Heart murmur   . Hepatitis    A -- as teenager  . Hyperlipidemia   . Hypertension   . Hypothyroidism   . Mitral valve prolapse   . OSA on CPAP   . Pneumonia    hx of  . Vertigo     Past Surgical History:  Procedure Laterality Date  . ABDOMINAL HYSTERECTOMY    . CARDIOVERSION N/A 10/11/2018   Procedure: CARDIOVERSION;  Surgeon: Dorothy Spark, MD;  Location: [facility] ENDOSCOPY;  Service: Cardiovascular;  Laterality: N/A;  . CHOLECYSTECTOMY    . FRACTURE SURGERY Right    leg  . GALLBLADDER SURGERY  1995  . JOINT REPLACEMENT Left    knee  .  NASAL SINUS SURGERY  2005  . PARTIAL HYSTERECTOMY  1983  . TEE WITHOUT CARDIOVERSION N/A 10/11/2018   Procedure: TRANSESOPHAGEAL ECHOCARDIOGRAM (TEE);  Surgeon: Dorothy Spark, MD;  Location: [facility] ENDOSCOPY;  Service: Cardiovascular;  Laterality: N/A;  . THROAT SURGERY  2005  . TOTAL KNEE ARTHROPLASTY Right 09/12/2014   Procedure: TOTAL KNEE ARTHROPLASTY;  Surgeon: Meredith Pel, MD;  Location: [place];  Service: Orthopedics;  Laterality: Right;     Prior to Admission medications   Medication Sig Start Date End Date Taking? Authorizing  Provider  apixaban (ELIQUIS) 5 MG TABS tablet Take 1 tablet (5 mg total) by mouth 2 (two) times daily. 09/28/18  Yes Danford, Suann Larry, MD  ARTIFICIAL TEAR SOLUTION OP Place 1 drop into both eyes daily as needed (dry eyes).   Yes [provider]  buPROPion (WELLBUTRIN XL) 300 MG 24 hr tablet Take 1 tablet (300 mg total) by mouth daily. 01/22/17  Yes Dohmeier, Asencion Partridge, MD  cetirizine (ZYRTEC) 10 MG tablet Take 10 mg by mouth daily as needed for allergies.   Yes [provider]  clonazePAM (KLONOPIN) 0.5 MG tablet Take 0.5 mg by mouth 2 (two) times daily.   Yes [provider]  fluticasone (FLONASE) 50 MCG/ACT nasal spray Place 1 spray into both nostrils daily as needed for allergies.  08/20/17  Yes [provider]  levothyroxine (SYNTHROID, LEVOTHROID) 25 MCG tablet Take 25 mcg by mouth daily before breakfast.    Yes [provider]  metFORMIN (GLUCOPHAGE-XR) 500 MG 24 hr tablet Take 500 mg by mouth 2 (two) times daily.   Yes [provider]  montelukast (SINGULAIR) 10 MG tablet Take 10 mg by mouth daily as needed (allergies).  12/28/17  Yes [provider]  omeprazole (PRILOSEC) 20 MG capsule Take 20 mg by mouth every evening.   Yes [provider]  oxybutynin (DITROPAN-XL) 10 MG 24 hr tablet Take 10 mg by mouth every morning.  06/17/17  Yes [provider]  pravastatin (PRAVACHOL) 40 MG tablet Take 40 mg by mouth at bedtime.    Yes [provider]  traZODone (DESYREL) 100 MG tablet Take 100 mg by mouth at bedtime.    Yes [provider]  venlafaxine (EFFEXOR) 50 MG tablet Take 2 tablets (100 mg total) by mouth daily. 11/22/15  Yes Dohmeier, Asencion Partridge, MD    Inpatient Medications: Scheduled Meds: . insulin aspart  1-3 Units Subcutaneous Q4H  . lactulose  20 g Oral BID  . levothyroxine  25 mcg Oral QAC breakfast  . mouth rinse  15 mL Mouth Rinse BID   Continuous Infusions: . acetylcysteine 15 mg/kg/hr  (12/10/18 1300)  . cefTRIAXone (ROCEPHIN)  IV    . diltiazem (CARDIZEM) infusion 10 mg/hr (12/10/18 1300)  . lactated ringers 150 mL/hr at 12/10/18 1300  . metronidazole Stopped (12/10/18 1208)  .  sodium bicarbonate  infusion 1000 mL 50 mL/hr at 12/10/18 1055   PRN Meds: polyethylene glycol  Allergies:    Allergies  Allergen Reactions  . Aciphex [Rabeprazole Sodium] Hives  . Coumadin [Warfarin Sodium] Other (See Comments)    Bleeding   . Erythromycin Nausea And Vomiting  . Lactose Intolerance (Gi) Diarrhea    Stomach pain  . Macrodantin Nausea And Vomiting  . Nsaids     Bleeding in bladder  . Atorvastatin Other (See Comments)    Body aches  . Sulfa Antibiotics Nausea Only and Other (See Comments)    Feel terrible  . Tape Rash  Social History:   Social History   Socioeconomic History  . Marital status: Married    Spouse name: Not on file  . Number of children: Not on file  . Years of education: Not on file  . Highest education level: Not on file  Occupational History  . Occupation: retired    Comment: Secondary school teacher  Social Needs  . Financial resource strain: Not on file  . Food insecurity:    Worry: Not on file    Inability: Not on file  . Transportation needs:    Medical: Not on file    Non-medical: Not on file  Tobacco Use  . Smoking status: Never Smoker  . Smokeless tobacco: Never Used  Substance and Sexual Activity  . Alcohol use: No    Comment: socially  . Drug use: No  . Sexual activity: Not on file  Lifestyle  . Physical activity:    Days per week: Not on file    Minutes per session: Not on file  . Stress: Not on file  Relationships  . Social connections:    Talks on phone: Not on file    Gets together: Not on file    Attends religious service: Not on file    Active member of club or organization: Not on file    Attends meetings of clubs or organizations: Not on file    Relationship status: Not on file  . Intimate partner violence:     Fear of current or ex partner: Not on file    Emotionally abused: Not on file    Physically abused: Not on file    Forced sexual activity: Not on file  Other Topics Concern  . Not on file  Social History Narrative  . Not on file    Family History:   Family History  Problem Relation Age of Onset  . Cancer Mother        panceratic  . Cancer - Colon Mother   . Breast cancer Maternal Aunt    Family Status:  Family Status  Relation Name Status  . Mother  (Not Specified)  . Mat Aunt  (Not Specified)    ROS:  Please see the history of present illness.  All other ROS reviewed and negative.     Physical Exam/Data:   Vitals:   12/10/18 1136 12/10/18 1200 12/10/18 1312 12/10/18 1549  BP:  134/66 (!) 148/124   Pulse:  90 (!) 136   Resp:  (!) 30 (!) 30   Temp: 97.6 F (36.4 C)   (!) 97.1 F (36.2 C)  TempSrc: Axillary   Axillary  SpO2:  94% 97%   Weight:        Intake/Output Summary (Last 24 hours) at 12/10/2018 1610 Last data filed at 12/10/2018 1300 Gross per 24 hour  Intake 5216.23 ml  Output 300 ml  Net 4916.23 ml   Filed Weights   12/09/18 2000  Weight: 77.1 kg   Body mass index is 29.18 kg/m.   General: Ill-appearing, NAD Skin: Warm, dry, intact  Head: Normocephalic, atraumatic,  clear, moist mucus membranes. Neck: Negative for carotid bruits. JVP is incresaed Lungs:Clear to ausculation bilaterally. No wheezes, rales, or rhonchi. Breathing is unlabored. Cardiovascular: Irregularly irregular with S1 S2. II/VI sys murmurs, rubs, gallops, or LV heave appreciated. Abdomen: Soft, tender, distended with normoactive bowel sounds. No obvious abdominal masses. MSK: Strength and tone appear normal for age. 5/5 in all extremities Extremities: No edema. No clubbing or cyanosis. DP/PT pulses 2+  bilaterally Neuro: Alert and oriented. No focal deficits. No facial asymmetry. MAE spontaneously. Psych: Responds to questions appropriately with normal affect.     EKG:  The  EKG was personally reviewed and demonstrates: 12/10/18 AF  Telemetry:  Telemetry was personally reviewed and demonstrates: 12/10/18 AF with HR 133  Relevant CV Studies:  ECHO: 10/11/2018  Study Conclusions  - Left ventricle: Systolic function was normal. The estimated   ejection fraction was in the range of 50% to 55%. No evidence of   thrombus. - Aortic valve: There was mild regurgitation. - Mitral valve: There was severe regurgitation, with multiple jets   directed centrally. - Left atrium: The atrium was dilated. No evidence of thrombus in   the atrial cavity or appendage. No evidence of thrombus in the   atrial cavity or appendage. No evidence of thrombus in the atrial   cavity or appendage. No evidence of thrombus in the appendage. - Right ventricle: The cavity size was mildly dilated. Systolic   function was mildly reduced. - Right atrium: The atrium was dilated. - Pulmonic valve: No evidence of vegetation.  Impressions:  - Successful cardioversion. No cardiac source of emboli was   indentified.  CATH: None   Myoview Lexiscan stress test 11/03/2018:   Nuclear stress EF: 39%. Diffuse hypokinesis. Recent echocardiogram 09/27/2018-EF 55%  There was no ST segment deviation noted during stress.  Defect 1: There is a small defect of mild severity present in the apex location.  This is an intermediate risk study based upon reduced ejection fraction. Consider the possibility of inaccurate EF based upon underlying atrial fibrillation/flutter. There are no significant perfusion defects identified.  Laboratory Data:  Chemistry Recent Labs  Lab 12/09/18 1613  12/09/18 2355 12/10/18 0216 12/10/18 0556  NA 135   < > 138 135 137  K 5.7*   < > 5.2* 4.5 4.0  CL 100  --  103  --  102  CO2 11*  --  12*  --  14*  GLUCOSE 81  --  146*  --  224*  BUN 29*  --  29*  --  32*  CREATININE 1.91*  --  1.78*  --  1.63*  CALCIUM 9.6  --  8.8*  --  8.4*  GFRNONAA 26*  --  28*  --   31*  GFRAA 30*  --  32*  --  36*  ANIONGAP 24*  --  23*  --  21*   < > = values in this interval not displayed.    Total Protein  Date Value Ref Range Status  12/10/2018 5.8 (L) 6.5 - 8.1 g/dL Final   Albumin  Date Value Ref Range Status  12/10/2018 3.8 3.5 - 5.0 g/dL Final   AST  Date Value Ref Range Status  12/10/2018 2,934 (H) 15 - 41 U/L Final    Comment:    RESULTS CONFIRMED BY MANUAL DILUTION   ALT  Date Value Ref Range Status  12/10/2018 1,603 (H) 0 - 44 U/L Final   Alkaline Phosphatase  Date Value Ref Range Status  12/10/2018 75 38 - 126 U/L Final   Total Bilirubin  Date Value Ref Range Status  12/10/2018 2.9 (H) 0.3 - 1.2 mg/dL Final   Hematology Recent Labs  Lab 12/09/18 1613 12/09/18 1833 12/10/18 0216 12/10/18 0556  WBC 15.2*  --   --  18.1*  RBC 4.69  --   --  3.59*  HGB 13.0 13.3 11.6* 10.0*  HCT 43.8 39.0 34.0*  32.5*  MCV 93.4  --   --  90.5  MCH 27.7  --   --  27.9  MCHC 29.7*  --   --  30.8  RDW 14.4  --   --  14.4  PLT 294  --   --  197   Cardiac EnzymesNo results for input(s): TROPONINI in the last 168 hours. No results for input(s): TROPIPOC in the last 168 hours.  BNPNo results for input(s): BNP, PROBNP in the last 168 hours.  DDimer No results for input(s): DDIMER in the last 168 hours. TSH:  Lab Results  Component Value Date   TSH 3.903 12/09/2018   Lipids:No results found for: CHOL, HDL, LDLCALC, LDLDIRECT, TRIG, CHOLHDL HgbA1c: Lab Results  Component Value Date   HGBA1C 5.9 (H) 10/12/2014    Radiology/Studies:  Ct Abdomen Pelvis Wo Contrast  Result Date: 12/09/2018 CLINICAL DATA:  Nausea, vomiting, weakness and dizziness 5 days. EXAM: CT ABDOMEN AND PELVIS WITHOUT CONTRAST TECHNIQUE: Multidetector CT imaging of the abdomen and pelvis was performed following the standard protocol without IV contrast. COMPARISON:  03/11/2017 and 10/11/2014 and MRI lumbar spine 07/30/2017 FINDINGS: Lower chest: Lung bases demonstrate a tiny  amount of right pleural fluid with subtle hazy density over the posterior right base likely atelectasis. Mild cardiomegaly. Hepatobiliary: Previous cholecystectomy. Liver and biliary tree are within normal. Pancreas: Subtle hazy attenuation adjacent the head of the pancreas/common bile duct. Findings could be due to mild acute pancreatitis. Spleen: Normal. Adrenals/Urinary Tract: Adrenal glands are normal. Kidneys normal in size and demonstrate no hydronephrosis or nephrolithiasis. There are several bilateral cysts unchanged. Ureters and bladder are normal. Stomach/Bowel: Stomach and small bowel are normal. Appendix is normal. Colon is normal. Vascular/Lymphatic: Minimal calcified plaque over the abdominal aorta. No adenopathy. Reproductive: Previous hysterectomy. Other: None. Musculoskeletal: Degenerative change of the spine. Known mild L4 compression fracture. IMPRESSION: Subtle hazy attenuation of the peripancreatic fat in the region of the head of the pancreas/common bile duct which could be seen due to mild acute pancreatitis. Recommend clinical correlation. Small right pleural effusion with mild dependent right basilar atelectasis. Bilateral renal cysts unchanged. Aortic Atherosclerosis (ICD10-I70.0). Stable L4 compression fracture. Electronically Signed   By: Marin Olp M.D.   On: 12/09/2018 18:57   Dg Chest Port 1 View  Result Date: 12/10/2018 CLINICAL DATA:  Central line placement. EXAM: PORTABLE CHEST 1 VIEW COMPARISON:  Chest radiograph December 09, 2018 FINDINGS: Cardiac silhouette is upper limits of normal size. Calcified aortic arch. Low inspiratory examination with elevated RIGHT hemidiaphragm. No pleural effusion or focal consolidation. No pneumothorax. New LEFT internal jugular central venous catheter distal tip projects at cavoatrial junction. Soft tissue planes and included osseous structures are unchanged. Surgical clips in the included right abdomen compatible with cholecystectomy.  IMPRESSION: 1. LEFT IJ central venous catheter distal tip projects at cavoatrial junction. No pneumothorax. 2. Stable cardiomegaly.  No acute pulmonary process. Electronically Signed   By: Elon Alas M.D.   On: 12/10/2018 04:55   Dg Chest Portable 1 View  Result Date: 12/09/2018 CLINICAL DATA:  Nausea, vomiting, weakness, and dizziness for 5 days, history atrial fibrillation, hypertension, diabetes mellitus, asthma, pneumonia EXAM: PORTABLE CHEST 1 VIEW COMPARISON:  Portable exam 1633 hours compared to 11/14/2015 FINDINGS: Enlargement of cardiac silhouette. Mediastinal contours and pulmonary vascularity normal. Lungs clear. No pleural effusion or pneumothorax. Bones unremarkable. IMPRESSION: Mild enlargement of cardiac silhouette. No acute abnormalities. Electronically Signed   By: Lavonia Dana M.D.   On: 12/09/2018 16:48  US Liver Doppler  Result Date: 12/10/2018 CLINICAL DATA:  73 year old female with nausea vomiting. Earlier CT of the abdomen pelvis demonstrated peripancreatic inflammation. EXAM: DUPLEX ULTRASOUND OF LIVER TECHNIQUE: Color and duplex Doppler ultrasound was performed to evaluate the hepatic in-flow and out-flow vessels. COMPARISON:  CT of the abdomen pelvis dated 12/09/2018 FINDINGS: Liver: The liver demonstrates a slightly heterogeneous echogenicity with coarsened echotexture. No significant surface nodularity. No biliary ductal dilatation. Main Portal Vein size: 1 cm Portal Vein Velocities Main Prox:  30 cm/sec Main Mid: 20 cm/sec Main Dist:  20 cm/sec Right: 5 cm/sec Left: 5 cm/sec Hepatic Vein Velocities Right:  10 cm/sec Middle:  5 cm/sec Left:  8 cm/sec IVC: Present and patent with normal respiratory phasicity. Hepatic Artery Velocity:  83 cm/sec Splenic Vein Velocity:  4 cm/sec Spleen: 7.3 x 3.3 x 3.8 cm with a total volume of 50 cm^3 (411 cm^3 is upper limit normal) Portal Vein Occlusion/Thrombus: No. There is incomplete filling of the midportion of the main portal vein with  color, likely artifactual. Attention to this area on follow-up imaging recommended. Splenic Vein Occlusion/Thrombus: No Ascites: None Varices: None A right pleural effusion is partially visualized. IMPRESSION: 1. Mildly coarsened liver echotexture without definite sonographic findings of cirrhosis. 2. Patent main portal vein with hepatopetal flow. 3. Patent hepatic veins with appropriate flow direction. 4. Right-sided pleural effusion. Electronically Signed   By: Anner Crete M.D.   On: 12/10/2018 02:35    Assessment and Plan:   1.  Persistent atrial fibrillation    Patient failed cardioversion in November   Has been maintained in rate control with anticoagulation as outpt Now in afib with RVR in setting of hepatic failure On IV diltiazem and po metoprolol   Question how much she is absorbing  I would recomm continuing IV diltiazem and po metoprolol for now Add IV metoprolol 5 mg every 4 hours   And follow       LVEF normal on echo in November 2019   Myovue normal perfusion in Dec 2019    2. Acute hepatic failure  -Unclear etiology with associated hepatic coagulopathy -AST/ALT markedly elevated on presentation (2934/1603)  Remain high  -Acute hepatitis panel negative  Note that daughter called and said trazedone bottle was found spilled on floor Receiving n acetylcystine IV, LR and bicarb  (total IV per hour approx 250 cc    She is 5 liters positive     Plan [facility] tx to [facility]    3 Elevated CK   CKtoday 2660, increased from 1150    Getting IV fluids       3.  Acute on chronic kidney disease stage II: -Creatinine, 1.63 today with a baseline of 1.0-1.5 -Likely in the setting of rhabdomyolysis -Currently with IV fluids   4.  DM2: -SSI for glucose control while inpatient status  5. Hypertension: -Would hold  losartan as  increasing metoprolol..  6. Hyperlipidemia: -On pravastatin 40 mg daily at home   On hold  Question relation to 2   Had tolerated before   7.  OSA: -CPAP on  hold secondary to vomiting  8. Mitral valve prolapse Posterior leaflet  -With moderate regurgitation per echocardiogram November 2019   Aim for better rate control now        For questions or updates, please contact [facility] Please consult [email or web address] for contact info under Cardiology/STEMI.   Lyndel Safe NP-C [facility] Pager: [phone] 12/10/2018 4:10 PM  Pt seen and examined  I have amended note above by Tonny Branch  Pt is an unfort 73 yo with history of persistent atrial fibrillation  Admitted with hepatic failure and rhabdomyolysis.     We are asked to see for Afib with RVR  Currently on IV dilt and po metoprolol On exam,  Pt appears uncomfortable    BP 147/90    P 100s to 120   Sat on RA 91% Neck   JVP is increased   L IJ Lungs are relatively clear Cardiac Irreg irreg   No signf murmurs Abd   Sl distended    Supple  Ext with tr edema  Feet warm     As noted above I would recomm continue diltiazem and po metoprolol   Add IV metorprolol as I am not sure how well absorbing with Gi abnormalities and pt receiving lactulose      Continue hydration.   Will need to follow   Place on O2 [place] given sats   May need lasix if continues to receive high dose IV.  May not tolerate this load in setting of high heart rates and MR Will continue to follow   Dorris Carnes

## 2018-12-10 NOTE — Progress Notes (Signed)
CRITICAL VALUE ALERT  Critical Value:  Lactic acid-9.3  Date & Time Notified:  12/10/2018 @ 00:05AM  Provider Notified: Lilian Kapur, RN   Orders Received/Actions taken: Lactic down from 10.4. Will continue to monitor closely.  Clint Bolder, RN 12/10/18 00:05AM

## 2018-12-11 DIAGNOSIS — K7200 Acute and subacute hepatic failure without coma: Secondary | ICD-10-CM | POA: Diagnosis present

## 2018-12-11 DIAGNOSIS — I4891 Unspecified atrial fibrillation: Secondary | ICD-10-CM

## 2018-12-11 DIAGNOSIS — R0902 Hypoxemia: Secondary | ICD-10-CM

## 2018-12-11 DIAGNOSIS — I482 Chronic atrial fibrillation, unspecified: Secondary | ICD-10-CM

## 2018-12-11 LAB — HEPATITIS PANEL, ACUTE
HCV Ab: <0.1 s/co ratio (ref 0.0–0.9)
Hep A IgM: NEGATIVE
Hep B C IgM: NEGATIVE
Hepatitis B Surface Ag: NEGATIVE

## 2018-12-11 LAB — COMPREHENSIVE METABOLIC PANEL
ALT: 1648 U/L — ABNORMAL HIGH (ref 0–44)
AST: 2219 U/L — ABNORMAL HIGH (ref 15–41)
Albumin: 3.3 g/dL — ABNORMAL LOW (ref 3.5–5.0)
Alkaline Phosphatase: 78 U/L (ref 38–126)
Anion gap: 17 — ABNORMAL HIGH (ref 5–15)
BILIRUBIN TOTAL: 2.7 mg/dL — AB (ref 0.3–1.2)
BUN: 28 mg/dL — ABNORMAL HIGH (ref 8–23)
CO2: 23 mmol/L (ref 22–32)
Calcium: 8.5 mg/dL — ABNORMAL LOW (ref 8.9–10.3)
Chloride: 96 mmol/L — ABNORMAL LOW (ref 98–111)
Creatinine, Ser: 1.08 mg/dL — ABNORMAL HIGH (ref 0.44–1.00)
GFR calc Af Amer: 59 mL/min — ABNORMAL LOW (ref >60)
GFR calc non Af Amer: 51 mL/min — ABNORMAL LOW (ref >60)
Glucose, Bld: 204 mg/dL — ABNORMAL HIGH (ref 70–99)
Potassium: 2.8 mmol/L — ABNORMAL LOW (ref 3.5–5.1)
Sodium: 136 mmol/L (ref 135–145)
Total Protein: 5.3 g/dL — ABNORMAL LOW (ref 6.5–8.1)

## 2018-12-11 LAB — HEPATITIS B E ANTIGEN: Hep B E Ag: NEGATIVE

## 2018-12-11 LAB — URINE CULTURE: Culture: NO GROWTH

## 2018-12-11 LAB — CBC
HCT: 34.4 % — ABNORMAL LOW (ref 36.0–46.0)
Hemoglobin: 11 g/dL — ABNORMAL LOW (ref 12.0–15.0)
MCH: 28.2 pg (ref 26.0–34.0)
MCHC: 32 g/dL (ref 30.0–36.0)
MCV: 88.2 fL (ref 80.0–100.0)
Platelets: 217 10*3/uL (ref 150–400)
RBC: 3.9 MIL/uL (ref 3.87–5.11)
RDW: 14.5 % (ref 11.5–15.5)
WBC: 15.5 10*3/uL — ABNORMAL HIGH (ref 4.0–10.5)
nRBC: 1.5 % — ABNORMAL HIGH (ref 0.0–0.2)

## 2018-12-11 LAB — GLUCOSE, CAPILLARY
Glucose-Capillary: 191 mg/dL — ABNORMAL HIGH (ref 70–99)
Glucose-Capillary: 179 mg/dL — ABNORMAL HIGH (ref 70–99)
Glucose-Capillary: 219 mg/dL — ABNORMAL HIGH (ref 70–99)
Glucose-Capillary: 251 mg/dL — ABNORMAL HIGH (ref 70–99)
Glucose-Capillary: 181 mg/dL — ABNORMAL HIGH (ref 70–99)
Glucose-Capillary: 267 mg/dL — ABNORMAL HIGH (ref 70–99)

## 2018-12-11 LAB — HEPATITIS B SURFACE ANTIGEN: Hepatitis B Surface Ag: NEGATIVE

## 2018-12-11 LAB — PROTIME-INR
INR: 6.71
INR: 3.68
PROTHROMBIN TIME: 36 s — AB (ref 11.4–15.2)
Prothrombin Time: 57.3 seconds — ABNORMAL HIGH (ref 11.4–15.2)

## 2018-12-11 LAB — MAGNESIUM: Magnesium: 1.7 mg/dL (ref 1.7–2.4)

## 2018-12-11 LAB — HEPATITIS A ANTIBODY, TOTAL: Hep A Total Ab: NEGATIVE

## 2018-12-11 LAB — HEPATITIS B CORE ANTIBODY, TOTAL: HEP B C TOTAL AB: NEGATIVE

## 2018-12-11 LAB — CK: Total CK: 3197 U/L — ABNORMAL HIGH (ref 38–234)

## 2018-12-11 MED ORDER — BOOST / RESOURCE BREEZE PO LIQD CUSTOM
1.0000 | Freq: Three times a day (TID) | ORAL | Status: DC
  Administered 2018-12-11 – 2018-12-16 (×14): 1 via ORAL
  Filled 2018-12-11 (×2): qty 1

## 2018-12-11 MED ORDER — ONDANSETRON HCL 4 MG/2ML IJ SOLN: 4.0000 mg | Freq: Four times a day (QID) | INTRAMUSCULAR | Status: DC | PRN

## 2018-12-11 MED ORDER — MAGNESIUM SULFATE 4 GM/100ML IV SOLN
4.0000 g | Freq: Once | INTRAVENOUS | Status: AC
  Administered 2018-12-11: 4 g via INTRAVENOUS
  Filled 2018-12-11: qty 100

## 2018-12-11 MED ORDER — BOOST / RESOURCE BREEZE PO LIQD CUSTOM: 1.0000 | Freq: Three times a day (TID) | ORAL | Status: DC

## 2018-12-11 MED ORDER — POTASSIUM CHLORIDE 10 MEQ/50ML IV SOLN
10.0000 meq | INTRAVENOUS | Status: AC
  Administered 2018-12-11 (×6): 10 meq via INTRAVENOUS
  Filled 2018-12-11 (×3): qty 50

## 2018-12-11 MED ORDER — SODIUM CHLORIDE 0.9 % IV SOLN
INTRAVENOUS | Status: DC
  Administered 2018-12-11: 13:00:00 via INTRAVENOUS

## 2018-12-11 MED ORDER — INSULIN ASPART 100 UNIT/ML ~~LOC~~ SOLN
0.0000 [IU] | SUBCUTANEOUS | Status: DC
  Administered 2018-12-11 (×2): 3 [IU] via SUBCUTANEOUS
  Administered 2018-12-11 (×2): 8 [IU] via SUBCUTANEOUS
  Administered 2018-12-12: 3 [IU] via SUBCUTANEOUS
  Administered 2018-12-12: 2 [IU] via SUBCUTANEOUS
  Administered 2018-12-12: 3 [IU] via SUBCUTANEOUS
  Administered 2018-12-12: 11 [IU] via SUBCUTANEOUS
  Administered 2018-12-12: 8 [IU] via SUBCUTANEOUS
  Administered 2018-12-12 – 2018-12-13 (×2): 2 [IU] via SUBCUTANEOUS
  Administered 2018-12-13: 3 [IU] via SUBCUTANEOUS
  Administered 2018-12-13: 2 [IU] via SUBCUTANEOUS
  Administered 2018-12-13: 5 [IU] via SUBCUTANEOUS
  Administered 2018-12-13: 3 [IU] via SUBCUTANEOUS
  Administered 2018-12-14: 8 [IU] via SUBCUTANEOUS
  Administered 2018-12-14 (×2): 3 [IU] via SUBCUTANEOUS
  Administered 2018-12-14 (×2): 2 [IU] via SUBCUTANEOUS
  Administered 2018-12-15: 8 [IU] via SUBCUTANEOUS
  Administered 2018-12-15: 3 [IU] via SUBCUTANEOUS
  Administered 2018-12-15: 5 [IU] via SUBCUTANEOUS
  Administered 2018-12-16 (×2): 3 [IU] via SUBCUTANEOUS
  Administered 2018-12-16: 8 [IU] via SUBCUTANEOUS
  Administered 2018-12-16: 3 [IU] via SUBCUTANEOUS
  Administered 2018-12-17: 8 [IU] via SUBCUTANEOUS
  Administered 2018-12-17: 2 [IU] via SUBCUTANEOUS
  Administered 2018-12-17: 3 [IU] via SUBCUTANEOUS
  Administered 2018-12-18: 8 [IU] via SUBCUTANEOUS
  Administered 2018-12-18 (×2): 3 [IU] via SUBCUTANEOUS
  Administered 2018-12-18: 8 [IU] via SUBCUTANEOUS
  Administered 2018-12-18: 5 [IU] via SUBCUTANEOUS
  Administered 2018-12-19 (×2): 8 [IU] via SUBCUTANEOUS
  Administered 2018-12-19: 2 [IU] via SUBCUTANEOUS

## 2018-12-11 NOTE — Progress Notes (Signed)
CRITICAL VALUE ALERT  Critical Value:  INR 6.71  Date & Time Notied:  12/11/18  Provider Notified: Warren Lacy  Orders Received/Actions taken: INR decreasing. Will watch for orders.   Also talked to [facility] nurse about pt increasing respiratory effort and O2 needs.

## 2018-12-11 NOTE — Progress Notes (Signed)
[facility] Progress Note Patient Name: Amanda [name] DOB: 1946-07-30 MRN: [id number]   Date of Service  12/11/2018  HPI/Events of Note  Hypokalemia and hypomag  eICU Interventions  Potassium and mag replaced     Intervention Category Intermediate Interventions: Electrolyte abnormality - evaluation and management  Otie Headlee 12/11/2018, 5:17 AM

## 2018-12-11 NOTE — Progress Notes (Signed)
Progress Note  Patient Name: Amanda [name] Date of Encounter: 12/11/2018  Primary Cardiologist: Ena Dawley, MD    Subjective   No cardiac complaints   Inpatient Medications    Scheduled Meds: . insulin aspart  1-3 Units Subcutaneous Q4H  . lactulose  20 g Oral BID  . levothyroxine  25 mcg Oral QAC breakfast  . mouth rinse  15 mL Mouth Rinse BID  . metoprolol tartrate  5 mg Intravenous 6 X Daily  . metoprolol tartrate  50 mg Oral BID   Continuous Infusions: . acetylcysteine 15 mg/kg/hr (12/11/18 0700)  . cefTRIAXone (ROCEPHIN)  IV Stopped (12/10/18 1849)  . diltiazem (CARDIZEM) infusion 10 mg/hr (12/11/18 0700)  . metronidazole Stopped (12/11/18 0505)  . potassium chloride 50 mL/hr at 12/11/18 0700  .  sodium bicarbonate  infusion 1000 mL 50 mL/hr at 12/11/18 0700   PRN Meds: polyethylene glycol, traMADol   Vital Signs    Vitals:   12/11/18 0600 12/11/18 0605 12/11/18 0615 12/11/18 0700  BP: (!) 160/116 (!) 162/97 (!) 142/100 137/71  Pulse: 77 78 91 (!) 102  Resp: (!) 33 (!) 31 (!) 28 (!) 24  Temp:      TempSrc:      SpO2: 96% 99% 100% 99%  Weight:        Intake/Output Summary (Last 24 hours) at 12/11/2018 0806 Last data filed at 12/11/2018 0700 Gross per 24 hour  Intake 4415.36 ml  Output 400 ml  Net 4015.36 ml   Last 3 Weights 12/11/2018 12/09/2018 11/22/2018  Weight (lbs) 191 lb 12.8 oz 170 lb 173 lb  Weight (kg) 87 kg 77.111 kg 78.472 kg      Telemetry    afib rates 80-90 - Personally Reviewed  ECG    AFib nonspecific ST changes  - Personally Reviewed  Physical Exam  Jaundice  GEN: No acute distress.   Neck: No JVD left IJ  Cardiac: RRR, MR  murmurs, rubs, or gallops.  Respiratory: Clear to auscultation bilaterally. GI: Soft, nontender, non-distended  MS: No edema; No deformity. Neuro:  Nonfocal  Psych: Normal affect   Labs    Chemistry Recent Labs  Lab 12/09/18 2355 12/10/18 0216 12/10/18 0556 12/11/18 0344  NA 138 135 137  136  K 5.2* 4.5 4.0 2.8*  CL 103  --  102 96*  CO2 12*  --  14* 23  GLUCOSE 146*  --  224* 204*  BUN 29*  --  32* 28*  CREATININE 1.78*  --  1.63* 1.08*  CALCIUM 8.8*  --  8.4* 8.5*  PROT 5.5*  --  5.8* 5.3*  ALBUMIN 3.1*  --  3.8 3.3*  AST 2,772*  --  2,934* 2,219*  ALT 1,822*  --  1,603* 1,648*  ALKPHOS 96  --  75 78  BILITOT 3.2*  --  2.9* 2.7*  GFRNONAA 28*  --  31* 51*  GFRAA 32*  --  36* 59*  ANIONGAP 23*  --  21* 17*     Hematology Recent Labs  Lab 12/09/18 1613  12/10/18 0216 12/10/18 0556 12/11/18 0344  WBC 15.2*  --   --  18.1* 15.5*  RBC 4.69  --   --  3.59* 3.90  HGB 13.0   < > 11.6* 10.0* 11.0*  HCT 43.8   < > 34.0* 32.5* 34.4*  MCV 93.4  --   --  90.5 88.2  MCH 27.7  --   --  27.9 28.2  MCHC 29.7*  --   --  30.8 32.0  RDW 14.4  --   --  14.4 14.5  PLT 294  --   --  197 217   < > = values in this interval not displayed.    Cardiac EnzymesNo results for input(s): TROPONINI in the last 168 hours. No results for input(s): TROPIPOC in the last 168 hours.   BNPNo results for input(s): BNP, PROBNP in the last 168 hours.   DDimer No results for input(s): DDIMER in the last 168 hours.   Radiology    Ct Abdomen Pelvis Wo Contrast  Result Date: 12/09/2018 CLINICAL DATA:  Nausea, vomiting, weakness and dizziness 5 days. EXAM: CT ABDOMEN AND PELVIS WITHOUT CONTRAST TECHNIQUE: Multidetector CT imaging of the abdomen and pelvis was performed following the standard protocol without IV contrast. COMPARISON:  03/11/2017 and 10/11/2014 and MRI lumbar spine 07/30/2017 FINDINGS: Lower chest: Lung bases demonstrate a tiny amount of right pleural fluid with subtle hazy density over the posterior right base likely atelectasis. Mild cardiomegaly. Hepatobiliary: Previous cholecystectomy. Liver and biliary tree are within normal. Pancreas: Subtle hazy attenuation adjacent the head of the pancreas/common bile duct. Findings could be due to mild acute pancreatitis. Spleen: Normal.  Adrenals/Urinary Tract: Adrenal glands are normal. Kidneys normal in size and demonstrate no hydronephrosis or nephrolithiasis. There are several bilateral cysts unchanged. Ureters and bladder are normal. Stomach/Bowel: Stomach and small bowel are normal. Appendix is normal. Colon is normal. Vascular/Lymphatic: Minimal calcified plaque over the abdominal aorta. No adenopathy. Reproductive: Previous hysterectomy. Other: None. Musculoskeletal: Degenerative change of the spine. Known mild L4 compression fracture. IMPRESSION: Subtle hazy attenuation of the peripancreatic fat in the region of the head of the pancreas/common bile duct which could be seen due to mild acute pancreatitis. Recommend clinical correlation. Small right pleural effusion with mild dependent right basilar atelectasis. Bilateral renal cysts unchanged. Aortic Atherosclerosis (ICD10-I70.0). Stable L4 compression fracture. Electronically Signed   By: Marin Olp M.D.   On: 12/09/2018 18:57   Dg Chest Port 1 View  Result Date: 12/10/2018 CLINICAL DATA:  Central line placement. EXAM: PORTABLE CHEST 1 VIEW COMPARISON:  Chest radiograph December 09, 2018 FINDINGS: Cardiac silhouette is upper limits of normal size. Calcified aortic arch. Low inspiratory examination with elevated RIGHT hemidiaphragm. No pleural effusion or focal consolidation. No pneumothorax. New LEFT internal jugular central venous catheter distal tip projects at cavoatrial junction. Soft tissue planes and included osseous structures are unchanged. Surgical clips in the included right abdomen compatible with cholecystectomy. IMPRESSION: 1. LEFT IJ central venous catheter distal tip projects at cavoatrial junction. No pneumothorax. 2. Stable cardiomegaly.  No acute pulmonary process. Electronically Signed   By: Elon Alas M.D.   On: 12/10/2018 04:55   Dg Chest Portable 1 View  Result Date: 12/09/2018 CLINICAL DATA:  Nausea, vomiting, weakness, and dizziness for 5 days,  history atrial fibrillation, hypertension, diabetes mellitus, asthma, pneumonia EXAM: PORTABLE CHEST 1 VIEW COMPARISON:  Portable exam 1633 hours compared to 11/14/2015 FINDINGS: Enlargement of cardiac silhouette. Mediastinal contours and pulmonary vascularity normal. Lungs clear. No pleural effusion or pneumothorax. Bones unremarkable. IMPRESSION: Mild enlargement of cardiac silhouette. No acute abnormalities. Electronically Signed   By: Lavonia Dana M.D.   On: 12/09/2018 16:48   US Liver Doppler  Result Date: 12/10/2018 CLINICAL DATA:  73 year old female with nausea vomiting. Earlier CT of the abdomen pelvis demonstrated peripancreatic inflammation. EXAM: DUPLEX ULTRASOUND OF LIVER TECHNIQUE: Color and duplex Doppler ultrasound was performed to evaluate the hepatic in-flow and out-flow vessels. COMPARISON:  CT of  the abdomen pelvis dated 12/09/2018 FINDINGS: Liver: The liver demonstrates a slightly heterogeneous echogenicity with coarsened echotexture. No significant surface nodularity. No biliary ductal dilatation. Main Portal Vein size: 1 cm Portal Vein Velocities Main Prox:  30 cm/sec Main Mid: 20 cm/sec Main Dist:  20 cm/sec Right: 5 cm/sec Left: 5 cm/sec Hepatic Vein Velocities Right:  10 cm/sec Middle:  5 cm/sec Left:  8 cm/sec IVC: Present and patent with normal respiratory phasicity. Hepatic Artery Velocity:  83 cm/sec Splenic Vein Velocity:  4 cm/sec Spleen: 7.3 x 3.3 x 3.8 cm with a total volume of 50 cm^3 (411 cm^3 is upper limit normal) Portal Vein Occlusion/Thrombus: No. There is incomplete filling of the midportion of the main portal vein with color, likely artifactual. Attention to this area on follow-up imaging recommended. Splenic Vein Occlusion/Thrombus: No Ascites: None Varices: None A right pleural effusion is partially visualized. IMPRESSION: 1. Mildly coarsened liver echotexture without definite sonographic findings of cirrhosis. 2. Patent main portal vein with hepatopetal flow. 3. Patent  hepatic veins with appropriate flow direction. 4. Right-sided pleural effusion. Electronically Signed   By: Anner Crete M.D.   On: 12/10/2018 02:35    Cardiac Studies   TEE EF 50-55% severe MR   Patient Profile     Amanda [name] is a 73 y.o. female with a hx of hypertension, hyperlipidemia, hypothyroidism, atrial fibrillation s/p DCCV 09/2018 on Eliquis, OSA on CPAP and frequent falls who is being seen today for the evaluation of atrial fibrillation with RVR at the request of Dr. Reesa Chew.  Assessment & Plan    Afib:  Rate control adequate no anticoagulation given liver failure continue iv cardizem no history of CAD and EF normal by TTE  MR:  Severe by TEE not a candidate for intervention may be clip candidate if she recovers from  Hepatic failure   Hepatic failure acetylcystine and LR hydration plans to transfer to [facility]       For questions or updates, please contact [facility] Please consult [email or web address] for contact info under        Signed, Jenkins Rouge, MD  12/11/2018, 8:06 AM

## 2018-12-11 NOTE — Progress Notes (Signed)
Nutrition Brief Note  RD consulted d/t patient poor appetite. Pt is in fulminant liver failure and is waiting to tx to [facility] for liver transplant.    Today, pt says that "everything taste like onions" and that "everything makes me sick". She can not name a single food or drink she has an desire for. Family member at bedside reports the patient also will only accept very specific consistencies-"whe wont drink anything thick".   RD educated pt that it is greatly in her best interest to try to eat- if she has poor nutrition status prior to a major procedure, she is at higher risk for complications. This did not seem to change her mind; She still says she "just cant eat". She did agree to try the [employer].   In event the pt changes her mind, RD showed pt/daughter how to contact [facility] using number on menu.   Pt tx to [facility] imminent. No further nutrition interventions warranted at this time. If nutrition issues arise, please consult RD.   Burtis Junes RD, LDN, CNSC Clinical Nutrition Available Tues-Sat via Pager: [phone] 12/11/2018 2:49 PM

## 2018-12-11 NOTE — Progress Notes (Signed)
NAME:  Amanda [name], MRN:  [id number], DOB:  1945/12/07, LOS: 2 ADMISSION DATE:  12/09/2018, CONSULTATION DATE:  1/23 REFERRING MD:  Dr. 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  History of present illness   73 year old female with past medical history as below, which is significant for atrial fibrillation on Eliquis, OSA on CPAP, hypertension, diabetes, and frequent falls.  Atrial fibrillation was her most recent diagnosis back in November 2019 and she was started on metoprolol and Eliquis at that time.  In reviewing her cardiology outpatient notes it seems that she is followed her medication regimen pretty strictly, however, in the months since her diagnosis she has frequently been in A. fib and has felt very "beat down"as a result.    Upon presenting to [facility] emergency department on 1/23 she complained of a 4 to 5-day history of nausea and vomiting.  Emesis described as clear mucus.  The symptoms did improve after the first 2 days, however, she continued to be weak and dizzy up until her presentation.  She denies having abdominal pain, hematemesis, diarrhea, constipation, hematochezia, cough, fevers, chills.  Describes normal bowel movements.  She seems to be a good historian and the history she gave seem to match up with chart review, however, she then told me she had been in the hospital for the last 2 days which is untrue.  She also describes having been on Eliquis for only a couple of weeks when it looks like this was started back in November 2019.   Laboratory evaluation in the emergency department was significant for AST 3257, ALT 1690, total bilirubin 3.6, CK 1150, lactic acid 10.4, potassium 5.7, bicarbonate 11, WBC 15.2, INR 6.81.  CT scan of the abdomen and pelvis demonstrated a possible mild pancreatitis with a small right pleural effusion.  She was treated  with ceftriaxone and Flagyl.  And was loaded with acetylcysteine and continued on an infusion.  Given the critical nature of her illness PCCM was asked to admit.  The emergency physician has contacted the hospital to [facility] for transfer.  She has been accepted, but is awaiting bed assignment.   Past Medical History   has a past medical history of A-fib ([place]), Anemia due to blood loss, Anxiety, Arthritis, Asthma, Complication of anesthesia, Depression, Diabetes type 2, controlled ([place]), GERD (gastroesophageal reflux disease), Headache, Heart murmur, Hepatitis, Hyperlipidemia, Hypertension, Hypothyroidism, Mitral valve prolapse, OSA on CPAP, Pneumonia, and Vertigo.  Significant Hospital Events   1/23 admit  Consults:    Procedures:    Significant Diagnostic Tests:  CT abd, pelv 1/23 > Subtle hazy attenuation of the peripancreatic fat in the region of the head of the pancreas/common bile duct which could be seen due to mild acute pancreatitis. Recommend clinical correlation. Small right pleural effusion with mild dependent right basilar atelectasis. Bilateral renal cysts unchanged.   Micro Data:  Blood 1/23 > Urine 1/23 > negative  Antimicrobials:  CTX 1/23 > Flagyl 1/23 >  Interim history/subjective:  Mild nausea.  Urine retention with lower abdominal pain.  Started on oxygen overnight.  Objective   Blood pressure (!) 147/118, pulse 81, temperature (!) 97.1 F (36.2 C), temperature source Axillary, resp. rate (!) 28, weight 87 kg, SpO2 95 %.        Intake/Output Summary (Last 24 hours) at 12/11/2018 1206 Last data filed at 12/11/2018  1100 Gross per 24 hour  Intake 5168.76 ml  Output 500 ml  Net 4668.76 ml   Filed Weights   12/09/18 2000 12/11/18 0500  Weight: 77.1 kg 87 kg    Examination:  General - alert Eyes - pupils reactive ENT - no sinus tenderness, no stridor Cardiac - irregular Chest - scattered rhonchi Abdomen - soft, non tender,  decreased bowel sounds Extremities - no cyanosis, clubbing, or edema Skin - no rashes Neuro - normal strength, moves extremities, follows commands Psych - normal mood and behavior     Resolved Hospital Problem list      Assessment & Plan:   Acute hepatic failure. Discussion: Cause unclear.  Acute hepatitis panel negative. Plan - continue NAC, ABx, lactulose - f/u CMET, INR - awaiting bed availability at [facility] - f/u autoimmune profile sent 1/24  AKI with rhabdo. Plan - continue IV fluids - d/c HCO3 in IV fluid  Hypokalemia. Plan - f/u BMET  Atrial fibrillation with RVR. Plan - cardizem gtt  DM Plan - SSI  OSA on CPAP Plan - hold CPAP due to vomiting  Hypothyroid  Plan - synthroid  Best practice:  Diet: Clear liquids-advance as tolerated. DVT prophylaxis: SCDs GI prophylaxis: Protonix Mobility: BR  Code Status: FULL Family Communication: updated family at bedside Disposition: ICU, pending transfer to [facility].   Labs   CBC: Recent Labs  Lab 12/09/18 1613 12/09/18 1833 12/10/18 0216 12/10/18 0556 12/11/18 0344  WBC 15.2*  --   --  18.1* 15.5*  NEUTROABS 12.2*  --   --   --   --   HGB 13.0 13.3 11.6* 10.0* 11.0*  HCT 43.8 39.0 34.0* 32.5* 34.4*  MCV 93.4  --   --  90.5 88.2  PLT 294  --   --  197 144    Basic Metabolic Panel: Recent Labs  Lab 12/09/18 1613 12/09/18 1833 12/09/18 2355 12/10/18 0216 12/10/18 0556 12/11/18 0344  NA 135 135 138 135 137 136  K 5.7* 5.6* 5.2* 4.5 4.0 2.8*  CL 100  --  103  --  102 96*  CO2 11*  --  12*  --  14* 23  GLUCOSE 81  --  146*  --  224* 204*  BUN 29*  --  29*  --  32* 28*  CREATININE 1.91*  --  1.78*  --  1.63* 1.08*  CALCIUM 9.6  --  8.8*  --  8.4* 8.5*  MG 2.0  --   --   --  1.7 1.7   GFR: Estimated Creatinine Clearance: 49.5 mL/min (A) (by C-G formula based on SCr of 1.08 mg/dL (H)). Recent Labs  Lab 12/09/18 1613  12/09/18 2355 12/10/18 0548 12/10/18 0556 12/10/18 1348  12/10/18 1845 12/11/18 0344  WBC 15.2*  --   --   --  18.1*  --   --  15.5*  LATICACIDVEN  --    < > 9.3* 6.2*  --  6.8* 4.1*  --    < > = values in this interval not displayed.    Liver Function Tests: Recent Labs  Lab 12/09/18 1613 12/09/18 2355 12/10/18 0556 12/11/18 0344  AST 3,257* 2,772* 2,934* 2,219*  ALT 1,690* 1,822* 1,603* 1,648*  ALKPHOS 111 96 75 78  BILITOT 3.6* 3.2* 2.9* 2.7*  PROT 6.3* 5.5* 5.8* 5.3*  ALBUMIN 3.8 3.1* 3.8 3.3*   Recent Labs  Lab 12/09/18 1613  LIPASE 22   Recent Labs  Lab 12/10/18 0730  AMMONIA 44*    ABG    Component Value Date/Time   PHART 7.446 12/11/2018 0455   PCO2ART 35.2 12/11/2018 0455   PO2ART 69.7 (L) 12/11/2018 0455   HCO3 23.8 12/11/2018 0455   TCO2 12 (L) 12/10/2018 0216   ACIDBASEDEF 14.0 (H) 12/10/2018 0216   O2SAT 92.6 12/11/2018 0455     Coagulation Profile: Recent Labs  Lab 12/09/18 1613 12/10/18 0556 12/10/18 1415 12/10/18 1645 12/11/18 0344  INR 6.81* >10.00* >10.00* 9.75* 6.71*    Cardiac Enzymes: Recent Labs  Lab 12/09/18 1950 12/10/18 0556 12/11/18 0344  CKTOTAL 1,150* 2,661* 3,197*    HbA1C: Hgb A1c MFr Bld  Date/Time Value Ref Range Status  10/12/2014 10:14 AM 5.9 (H) <5.7 % Final    Comment:    (NOTE)                                                                       According to the ADA Clinical Practice Recommendations for 2011, when HbA1c is used as a screening test:  >=6.5%   Diagnostic of Diabetes Mellitus           (if abnormal result is confirmed) 5.7-6.4%   Increased risk of developing Diabetes Mellitus References:Diagnosis and Classification of Diabetes Mellitus,Diabetes YQIH,4742,59(DGLOV 1):S62-S69 and Standards of Medical Care in         Diabetes - 2011,Diabetes FIEP,3295,18 (Suppl 1):S11-S61.     CBG: Recent Labs  Lab 12/10/18 1736 12/10/18 1947 12/10/18 2349 12/11/18 0349 12/11/18 0813  GLUCAP 175* 185* 191* 179* 219*    CC time 31 minutes  Chesley Mires, MD [place] 12/11/2018, 12:12 PM

## 2018-12-11 NOTE — Progress Notes (Signed)
Received call from RN @[facility] . Per [facility], still do not have bed for pt. Confirmed pt still in ICU and still needs bed.

## 2018-12-12 ENCOUNTER — Inpatient Hospital Stay (HOSPITAL_COMMUNITY): Payer: Medicare Other

## 2018-12-12 LAB — CBC
HEMATOCRIT: 34.1 % — AB (ref 36.0–46.0)
Hemoglobin: 10.9 g/dL — ABNORMAL LOW (ref 12.0–15.0)
MCH: 27.9 pg (ref 26.0–34.0)
MCHC: 32 g/dL (ref 30.0–36.0)
MCV: 87.2 fL (ref 80.0–100.0)
Platelets: 196 10*3/uL (ref 150–400)
RBC: 3.91 MIL/uL (ref 3.87–5.11)
RDW: 14.8 % (ref 11.5–15.5)
WBC: 14.3 10*3/uL — ABNORMAL HIGH (ref 4.0–10.5)
nRBC: 0.5 % — ABNORMAL HIGH (ref 0.0–0.2)

## 2018-12-12 LAB — COMPREHENSIVE METABOLIC PANEL
ALBUMIN: 2.9 g/dL — AB (ref 3.5–5.0)
ALT: 1032 U/L — ABNORMAL HIGH (ref 0–44)
AST: 564 U/L — ABNORMAL HIGH (ref 15–41)
Alkaline Phosphatase: 72 U/L (ref 38–126)
Anion gap: 13 (ref 5–15)
BUN: 23 mg/dL (ref 8–23)
CO2: 26 mmol/L (ref 22–32)
Calcium: 8.4 mg/dL — ABNORMAL LOW (ref 8.9–10.3)
Chloride: 98 mmol/L (ref 98–111)
Creatinine, Ser: 0.83 mg/dL (ref 0.44–1.00)
GFR calc Af Amer: >60 mL/min (ref >60)
GFR calc non Af Amer: >60 mL/min (ref >60)
Glucose, Bld: 157 mg/dL — ABNORMAL HIGH (ref 70–99)
Potassium: <2 mmol/L — CL (ref 3.5–5.1)
Sodium: 137 mmol/L (ref 135–145)
Total Bilirubin: 2.1 mg/dL — ABNORMAL HIGH (ref 0.3–1.2)
Total Protein: 4.8 g/dL — ABNORMAL LOW (ref 6.5–8.1)

## 2018-12-12 LAB — BASIC METABOLIC PANEL
Anion gap: 12 (ref 5–15)
BUN: 22 mg/dL (ref 8–23)
CHLORIDE: 98 mmol/L (ref 98–111)
CO2: 23 mmol/L (ref 22–32)
Calcium: 8.3 mg/dL — ABNORMAL LOW (ref 8.9–10.3)
Creatinine, Ser: 0.82 mg/dL (ref 0.44–1.00)
GFR calc Af Amer: >60 mL/min (ref >60)
GFR calc non Af Amer: >60 mL/min (ref >60)
Glucose, Bld: 330 mg/dL — ABNORMAL HIGH (ref 70–99)
Potassium: 2.1 mmol/L — CL (ref 3.5–5.1)
Sodium: 133 mmol/L — ABNORMAL LOW (ref 135–145)

## 2018-12-12 LAB — ANA W/REFLEX IF POSITIVE: Anti Nuclear Antibody(ANA): NEGATIVE

## 2018-12-12 LAB — ANTI-MICROSOMAL ANTIBODY LIVER / KIDNEY: LKM1 Ab: 0.6 Units (ref 0.0–20.0)

## 2018-12-12 LAB — GLUCOSE, CAPILLARY
GLUCOSE-CAPILLARY: 152 mg/dL — AB (ref 70–99)
Glucose-Capillary: 139 mg/dL — ABNORMAL HIGH (ref 70–99)
Glucose-Capillary: 142 mg/dL — ABNORMAL HIGH (ref 70–99)
Glucose-Capillary: 175 mg/dL — ABNORMAL HIGH (ref 70–99)
Glucose-Capillary: 180 mg/dL — ABNORMAL HIGH (ref 70–99)
Glucose-Capillary: 265 mg/dL — ABNORMAL HIGH (ref 70–99)
Glucose-Capillary: 315 mg/dL — ABNORMAL HIGH (ref 70–99)

## 2018-12-12 LAB — ANTI-SMOOTH MUSCLE ANTIBODY, IGG: F-ACTIN AB IGG: 5 U (ref 0–19)

## 2018-12-12 LAB — PROTIME-INR
INR: 2.52
PROTHROMBIN TIME: 26.8 s — AB (ref 11.4–15.2)

## 2018-12-12 LAB — POTASSIUM
Potassium: 2.7 mmol/L — CL (ref 3.5–5.1)
Potassium: 3.3 mmol/L — ABNORMAL LOW (ref 3.5–5.1)

## 2018-12-12 LAB — LACTIC ACID, PLASMA: Lactic Acid, Venous: 1.6 mmol/L (ref 0.5–1.9)

## 2018-12-12 LAB — MAGNESIUM: Magnesium: 1.9 mg/dL (ref 1.7–2.4)

## 2018-12-12 LAB — CK: Total CK: 777 U/L — ABNORMAL HIGH (ref 38–234)

## 2018-12-12 LAB — MITOCHONDRIAL ANTIBODIES: Mitochondrial M2 Ab, IgG: <20 Units (ref 0.0–20.0)

## 2018-12-12 MED ORDER — DILTIAZEM HCL 30 MG PO TABS
30.0000 mg | ORAL_TABLET | Freq: Three times a day (TID) | ORAL | Status: DC
  Administered 2018-12-12 – 2018-12-15 (×9): 30 mg via ORAL
  Filled 2018-12-12 (×9): qty 1

## 2018-12-12 MED ORDER — INSULIN GLARGINE 100 UNIT/ML ~~LOC~~ SOLN
10.0000 [IU] | Freq: Every day | SUBCUTANEOUS | Status: DC
  Administered 2018-12-12 – 2018-12-19 (×8): 10 [IU] via SUBCUTANEOUS
  Filled 2018-12-12 (×9): qty 0.1

## 2018-12-12 MED ORDER — POTASSIUM CHLORIDE CRYS ER 20 MEQ PO TBCR
40.0000 meq | EXTENDED_RELEASE_TABLET | ORAL | Status: AC
  Administered 2018-12-12 (×4): 40 meq via ORAL
  Filled 2018-12-12 (×3): qty 2

## 2018-12-12 MED ORDER — INSULIN GLARGINE 100 UNITS/ML SOLOSTAR PEN
10.0000 [IU] | PEN_INJECTOR | Freq: Every day | SUBCUTANEOUS | Status: DC
  Filled 2018-12-12: qty 3

## 2018-12-12 MED ORDER — POTASSIUM CHLORIDE CRYS ER 20 MEQ PO TBCR
40.0000 meq | EXTENDED_RELEASE_TABLET | Freq: Once | ORAL | Status: AC
  Administered 2018-12-12: 40 meq via ORAL
  Filled 2018-12-12: qty 2

## 2018-12-12 MED ORDER — LACTULOSE 10 GM/15ML PO SOLN
30.0000 g | Freq: Two times a day (BID) | ORAL | Status: DC
  Administered 2018-12-12 (×2): 30 g via ORAL
  Filled 2018-12-12 (×2): qty 45

## 2018-12-12 MED ORDER — POTASSIUM CHLORIDE 10 MEQ/50ML IV SOLN
10.0000 meq | INTRAVENOUS | Status: AC
  Administered 2018-12-12 (×6): 10 meq via INTRAVENOUS
  Filled 2018-12-12 (×6): qty 50

## 2018-12-12 NOTE — Progress Notes (Signed)
[facility] Progress Note Patient Name: Amanda [name] DOB: 1946/09/29 MRN: [id number]   Date of Service  12/12/2018  HPI/Events of Note  Hypokalemia  eICU Interventions  Potassium replaced     Intervention Category Major Interventions: Electrolyte abnormality - evaluation and management  DETERDING,ELIZABETH 12/12/2018, 5:30 AM

## 2018-12-12 NOTE — Progress Notes (Signed)
CRITICAL VALUE ALERT  Critical Value:  K<2  Date & Time Notied:  12/12/18 0518  Provider Notified:   Orders Received/Actions taken:

## 2018-12-12 NOTE — Progress Notes (Signed)
Notified CCM Resident, Dr. Reesa Chew of patients blood sugar 315 Pt currently on moderate sliding scale insulin New orders placed for long acting insulin Will continue to monitor

## 2018-12-12 NOTE — Progress Notes (Signed)
Progress Note  Patient Name: Amanda [name] Date of Encounter: 12/12/2018  Primary Cardiologist: Ena Dawley, MD    Subjective   No cardiac complaints   Inpatient Medications    Scheduled Meds: . feeding supplement  1 Container Oral TID BM  . insulin aspart  0-15 Units Subcutaneous Q4H  . lactulose  30 g Oral BID  . levothyroxine  25 mcg Oral QAC breakfast  . mouth rinse  15 mL Mouth Rinse BID  . metoprolol tartrate  50 mg Oral BID   Continuous Infusions: . sodium chloride 50 mL/hr at 12/12/18 0800  . acetylcysteine 15 mg/kg/hr (12/12/18 0800)  . cefTRIAXone (ROCEPHIN)  IV Stopped (12/11/18 2009)  . diltiazem (CARDIZEM) infusion 5 mg/hr (12/12/18 0800)  . metronidazole Stopped (12/12/18 0503)  . potassium chloride 10 mEq (12/12/18 0907)   PRN Meds: ondansetron, polyethylene glycol, traMADol   Vital Signs    Vitals:   12/12/18 0600 12/12/18 0700 12/12/18 0800 12/12/18 0804  BP: (!) 146/82 140/74 135/66   Pulse: 81 67 (!) 30   Resp: (!) 22 (!) 24 (!) 22   Temp:    (!) 96.8 F (36 C)  TempSrc:    Axillary  SpO2: 99% 97% 100%   Weight:        Intake/Output Summary (Last 24 hours) at 12/12/2018 0923 Last data filed at 12/12/2018 0800 Gross per 24 hour  Intake 3244.49 ml  Output 2975 ml  Net 269.49 ml   Last 3 Weights 12/12/2018 12/11/2018 12/09/2018  Weight (lbs) 191 lb 12.8 oz 191 lb 12.8 oz 170 lb  Weight (kg) 87 kg 87 kg 77.111 kg      Telemetry    afib rates 70-90  - Personally Reviewed  ECG    AFib nonspecific ST changes  - Personally Reviewed  Physical Exam  Jaundice  GEN: No acute distress.   Neck: No JVD left IJ with lipoma in fossa  Cardiac: RRR, MR  murmurs, rubs, or gallops.  Respiratory: Clear to auscultation bilaterally. GI: Soft, nontender, non-distended  MS: No edema; No deformity. Neuro:  Nonfocal  Psych: Normal affect   Labs    Chemistry Recent Labs  Lab 12/10/18 0556 12/11/18 0344 12/12/18 0354  NA 137 136 137  K  4.0 2.8* <2.0*  CL 102 96* 98  CO2 14* 23 26  GLUCOSE 224* 204* 157*  BUN 32* 28* 23  CREATININE 1.63* 1.08* 0.83  CALCIUM 8.4* 8.5* 8.4*  PROT 5.8* 5.3* 4.8*  ALBUMIN 3.8 3.3* 2.9*  AST 2,934* 2,219* 564*  ALT 1,603* 1,648* 1,032*  ALKPHOS 75 78 72  BILITOT 2.9* 2.7* 2.1*  GFRNONAA 31* 51* >60  GFRAA 36* 59* >60  ANIONGAP 21* 17* 13     Hematology Recent Labs  Lab 12/10/18 0556 12/11/18 0344 12/12/18 0354  WBC 18.1* 15.5* 14.3*  RBC 3.59* 3.90 3.91  HGB 10.0* 11.0* 10.9*  HCT 32.5* 34.4* 34.1*  MCV 90.5 88.2 87.2  MCH 27.9 28.2 27.9  MCHC 30.8 32.0 32.0  RDW 14.4 14.5 14.8  PLT 197 217 196    Cardiac EnzymesNo results for input(s): TROPONINI in the last 168 hours. No results for input(s): TROPIPOC in the last 168 hours.   BNPNo results for input(s): BNP, PROBNP in the last 168 hours.   DDimer No results for input(s): DDIMER in the last 168 hours.   Radiology    Dg Chest Port 1 View  Result Date: 12/12/2018 CLINICAL DATA:  Hypoxia EXAM: PORTABLE CHEST 1  VIEW COMPARISON:  Chest x-rays dated 12/10/2018 and 12/09/2018. FINDINGS: Stable cardiomegaly. LEFT IJ central line is stable in position with tip overlying the RIGHT atrium. New opacity at the LEFT lung base, atelectasis and/or small pleural effusion. RIGHT lung remains clear. No pneumothorax seen. IMPRESSION: New atelectasis and/or small pleural effusion at the LEFT lung base. Electronically Signed   By: Franki Cabot M.D.   On: 12/12/2018 06:31    Cardiac Studies   TEE EF 50-55% severe MR   Patient Profile     Amanda [name] is a 73 y.o. female with a hx of hypertension, hyperlipidemia, hypothyroidism, atrial fibrillation s/p DCCV 09/2018 on Eliquis, OSA on CPAP and frequent falls who is being seen today for the evaluation of atrial fibrillation with RVR at the request of Dr. Reesa Chew.  Assessment & Plan    Afib:  Rate control adequate no anticoagulation given liver failure continue iv cardizem no history of  CAD and EF normal by TTE  MR:  Severe by TEE not a candidate for intervention may be clip candidate if she recovers from  Hepatic failure   Hepatic failure acetylcystine and LR hydration plans to transfer to [facility]       For questions or updates, please contact [facility] Please consult [email or web address] for contact info under        Signed, Jenkins Rouge, MD  12/12/2018, 9:23 AM

## 2018-12-12 NOTE — Progress Notes (Signed)
CRITICAL VALUE ALERT  Critical Value:  k 2.1  Date & Time Notied:  1328   Provider Notified: sood  Orders Received/Actions taken: awaiting orders

## 2018-12-12 NOTE — Progress Notes (Signed)
CRITICAL VALUE ALERT  Critical Value:  K < 2   Date & Time Notied:  12/12/18 0520   Provider Notified: Warren Lacy  Orders Received/Actions taken: monitor for orders

## 2018-12-12 NOTE — Progress Notes (Signed)
Repeated Potassium d/t critical low value after replacement this AM  New Potassium value: 2.7 CCM Resident, Dr. Reesa Chew notified

## 2018-12-12 NOTE — Progress Notes (Addendum)
NAME:  Amanda [name], MRN:  [id number], DOB:  [birth year], LOS: 3 ADMISSION DATE:  12/09/2018, CONSULTATION DATE:  1/23 REFERRING MD:  Dr. 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  History of present illness   73 year old female with past medical history as below, which is significant for atrial fibrillation on Eliquis, OSA on CPAP, hypertension, diabetes, and frequent falls.  Atrial fibrillation was her most recent diagnosis back in November 2019 and she was started on metoprolol and Eliquis at that time.  In reviewing her cardiology outpatient notes it seems that she is followed her medication regimen pretty strictly, however, in the months since her diagnosis she has frequently been in A. fib and has felt very "beat down"as a result.    Upon presenting to [facility] emergency department on 1/23 she complained of a 4 to 5-day history of nausea and vomiting.  Emesis described as clear mucus.  The symptoms did improve after the first 2 days, however, she continued to be weak and dizzy up until her presentation.  She denies having abdominal pain, hematemesis, diarrhea, constipation, hematochezia, cough, fevers, chills.  Describes normal bowel movements.  She seems to be a good historian and the history she gave seem to match up with chart review, however, she then told me she had been in the hospital for the last 2 days which is untrue.  She also describes having been on Eliquis for only a couple of weeks when it looks like this was started back in November 2019.   Laboratory evaluation in the emergency department was significant for AST 3257, ALT 1690, total bilirubin 3.6, CK 1150, lactic acid 10.4, potassium 5.7, bicarbonate 11, WBC 15.2, INR 6.81.  CT scan of the abdomen and pelvis demonstrated a possible mild pancreatitis with a small right pleural effusion.  She was treated  with ceftriaxone and Flagyl.  And was loaded with acetylcysteine and continued on an infusion.  Given the critical nature of her illness PCCM was asked to admit.  The emergency physician has contacted the hospital to [facility] for transfer.  She has been accepted, but is awaiting bed assignment.   Past Medical History   has a past medical history of A-fib ([place]), Anemia due to blood loss, Anxiety, Arthritis, Asthma, Complication of anesthesia, Depression, Diabetes type 2, controlled ([place]), GERD (gastroesophageal reflux disease), Headache, Heart murmur, Hepatitis, Hyperlipidemia, Hypertension, Hypothyroidism, Mitral valve prolapse, OSA on CPAP, Pneumonia, and Vertigo.  Significant Hospital Events   1/23 admit  Consults:    Procedures:    Significant Diagnostic Tests:  CT abd, pelv 1/23 > Subtle hazy attenuation of the peripancreatic fat in the region of the head of the pancreas/common bile duct which could be seen due to mild acute pancreatitis. Recommend clinical correlation. Small right pleural effusion with mild dependent right basilar atelectasis. Bilateral renal cysts unchanged.   Micro Data:  Blood 1/23 >Negative Urine 1/23 >Negative  Antimicrobials:  CTX 1/23 > Flagyl 1/23 >  Interim history/subjective:  Patient was feeling better this morning.  Did not had any bowel movements yesterday.  Cannot recall any extra medication or out of normal food over the past couple of weeks.  Objective   Blood pressure 135/66, pulse (!) 30, temperature (!) 96.8 F (36 C), temperature source Axillary, resp. rate (!) 22, weight 87 kg, SpO2 100 %.  Intake/Output Summary (Last 24 hours) at 12/12/2018 0854 Last data filed at 12/12/2018 0800 Gross per 24 hour  Intake 3350.6 ml  Output 2975 ml  Net 375.6 ml   Filed Weights   12/09/18 2000 12/11/18 0500 12/12/18 0500  Weight: 77.1 kg 87 kg 87 kg    Examination: General: Well developed, elderly female in no acute  distress HENT: [place]/At, PERRL, No appreciable JVD Lungs: Clear bilateral breath sounds Cardiovascular: Irregular with normal rate, no MRG Abdomen: Soft, non-tender, non-distended.  Bowel sounds positive. Extremities: No acute deformity or ROM limitation. No edema.  Neuro: Alert, oriented x 3 and non-focal.   Resolved Hospital Problem list      Assessment & Plan:   Hepatic failure: Etiology unclear. Associated hepatic coagulopathy. Acute hepatitis panel negative. Liver functions and INR improving. Autoimmune profile pending. -Increase lactulose to 30 mg twice daily - Trend LFT, Coags -Discontinue ceftriaxone and flagyl there is no evidence for any intra-abdominal infection. -Discontinue N-acetylcysteine. - Awaiting transfer to [facility]-might not need.  AKI: likely secondary to rhabdomyolysis.  Creatinine has been normalized, CK improving. - Gentle hydration - Repeat CK  Hypokalemia. Potassium at 2, repleting with total of 100 mEq. - Repeat BMP in the afternoon.    Anion gap metabolic acidosis: Improving. Lactic acidosis resolved.  Possible pancreatitis on CT: Lipase on admission 22 - Supportive care.    Atrial fibrillation with RVR. Patient is on diltiazem infusion, and metoprolol. Cardiology is following-we appreciate their recommendation. [place] Vasc score of 3-we will hold anticoagulation due to her hepatic dysfunction -Discontinue diltiazem infusion and start her on diltiazem 30 mg every 8 hourly. -Discontinue IV metoprolol and continue p.o. metoprolol. - Telemetry monitoring - Monitor for now  DM - SSI  OSA on CPAP -CPAP at night.  Hypothyroid TSH wnl - continue synthroid  Best practice:  Diet: Heart healthy Pain/Anxiety/Delirium protocol (if indicated): na VAP protocol (if indicated): na DVT prophylaxis: Coagulopathic GI prophylaxis: not yet indicated Glucose control: SSI Mobility: BR  Code Status: FULL Family Communication: Patient updated.  Disposition:  ICU, pending transfer to [facility].   Labs   CBC: Recent Labs  Lab 12/09/18 1613 12/09/18 1833 12/10/18 0216 12/10/18 0556 12/11/18 0344 12/12/18 0354  WBC 15.2*  --   --  18.1* 15.5* 14.3*  NEUTROABS 12.2*  --   --   --   --   --   HGB 13.0 13.3 11.6* 10.0* 11.0* 10.9*  HCT 43.8 39.0 34.0* 32.5* 34.4* 34.1*  MCV 93.4  --   --  90.5 88.2 87.2  PLT 294  --   --  197 217 778    Basic Metabolic Panel: Recent Labs  Lab 12/09/18 1613  12/09/18 2355 12/10/18 0216 12/10/18 0556 12/11/18 0344 12/12/18 0354  NA 135   < > 138 135 137 136 137  K 5.7*   < > 5.2* 4.5 4.0 2.8* <2.0*  CL 100  --  103  --  102 96* 98  CO2 11*  --  12*  --  14* 23 26  GLUCOSE 81  --  146*  --  224* 204* 157*  BUN 29*  --  29*  --  32* 28* 23  CREATININE 1.91*  --  1.78*  --  1.63* 1.08* 0.83  CALCIUM 9.6  --  8.8*  --  8.4* 8.5* 8.4*  MG 2.0  --   --   --  1.7 1.7  --    < > = values in this interval  not displayed.   GFR: Estimated Creatinine Clearance: 64.4 mL/min (by C-G formula based on SCr of 0.83 mg/dL). Recent Labs  Lab 12/09/18 1613  12/09/18 2355 12/10/18 0548 12/10/18 0556 12/10/18 1348 12/10/18 1845 12/11/18 0344 12/12/18 0354  WBC 15.2*  --   --   --  18.1*  --   --  15.5* 14.3*  LATICACIDVEN  --    < > 9.3* 6.2*  --  6.8* 4.1*  --   --    < > = values in this interval not displayed.    Liver Function Tests: Recent Labs  Lab 12/09/18 1613 12/09/18 2355 12/10/18 0556 12/11/18 0344 12/12/18 0354  AST 3,257* 2,772* 2,934* 2,219* 564*  ALT 1,690* 1,822* 1,603* 1,648* 1,032*  ALKPHOS 111 96 75 78 72  BILITOT 3.6* 3.2* 2.9* 2.7* 2.1*  PROT 6.3* 5.5* 5.8* 5.3* 4.8*  ALBUMIN 3.8 3.1* 3.8 3.3* 2.9*   Recent Labs  Lab 12/09/18 1613  LIPASE 22   Recent Labs  Lab 12/10/18 0730  AMMONIA 44*    ABG    Component Value Date/Time   PHART 7.446 12/11/2018 0455   PCO2ART 35.2 12/11/2018 0455   PO2ART 69.7 (L) 12/11/2018 0455   HCO3 23.8 12/11/2018 0455   TCO2 12 (L)  12/10/2018 0216   ACIDBASEDEF 14.0 (H) 12/10/2018 0216   O2SAT 92.6 12/11/2018 0455     Coagulation Profile: Recent Labs  Lab 12/10/18 1415 12/10/18 1645 12/11/18 0344 12/11/18 1638 12/12/18 0800  INR >10.00* 9.75* 6.71* 3.68 2.52    Cardiac Enzymes: Recent Labs  Lab 12/09/18 1950 12/10/18 0556 12/11/18 0344  CKTOTAL 1,150* 2,661* 3,197*    HbA1C: Hgb A1c MFr Bld  Date/Time Value Ref Range Status  10/12/2014 10:14 AM 5.9 (H) <5.7 % Final    Comment:    (NOTE)                                                                       According to the ADA Clinical Practice Recommendations for 2011, when HbA1c is used as a screening test:  >=6.5%   Diagnostic of Diabetes Mellitus           (if abnormal result is confirmed) 5.7-6.4%   Increased risk of developing Diabetes Mellitus References:Diagnosis and Classification of Diabetes Mellitus,Diabetes EXHB,7169,67(ELFYB 1):S62-S69 and Standards of Medical Care in         Diabetes - 2011,Diabetes Care,2011,34 (Suppl 1):S11-S61.     CBG: Recent Labs  Lab 12/11/18 1619 12/11/18 1945 12/11/18 2348 12/12/18 0352 12/12/18 0807  GLUCAP 181* 267* 180* 142* 265*    Review of Systems:   Bolds are positive  Constitutional: weight loss, gain, night sweats, Fevers, chills, fatigue .  HEENT: headaches, Sore throat, sneezing, nasal congestion, post nasal drip, Difficulty swallowing, Tooth/dental problems, visual complaints visual changes, ear ache CV:  chest pain, radiates:,Orthopnea, PND, swelling in lower extremities, dizziness, palpitations, syncope.  GI  heartburn, indigestion, abdominal pain, nausea, vomiting, diarrhea, change in bowel habits, loss of appetite, bloody stools.  Resp: cough, productive:, hemoptysis, dyspnea, chest pain, pleuritic.  Skin: rash or itching or icterus GU: dysuria, change in color of urine, urgency or frequency. flank pain, hematuria  MS: joint pain or swelling. decreased range of motion  Psych:  change in mood or affect. depression or anxiety.  Neuro: difficulty with speech, weakness, numbness, ataxia    Past Medical History  She,  has a past medical history of A-fib ([place]), Anemia due to blood loss, Anxiety, Arthritis, Asthma, Complication of anesthesia, Depression, Diabetes type 2, controlled ([place]), GERD (gastroesophageal reflux disease), Headache, Heart murmur, Hepatitis, Hyperlipidemia, Hypertension, Hypothyroidism, Mitral valve prolapse, OSA on CPAP, Pneumonia, and Vertigo.   Surgical History    Past Surgical History:  Procedure Laterality Date  . ABDOMINAL HYSTERECTOMY    . CARDIOVERSION N/A 10/11/2018   Procedure: CARDIOVERSION;  Surgeon: Dorothy Spark, MD;  Location: [facility] ENDOSCOPY;  Service: Cardiovascular;  Laterality: N/A;  . CHOLECYSTECTOMY    . FRACTURE SURGERY Right    leg  . GALLBLADDER SURGERY  1995  . JOINT REPLACEMENT Left    knee  . NASAL SINUS SURGERY  2005  . PARTIAL HYSTERECTOMY  1983  . TEE WITHOUT CARDIOVERSION N/A 10/11/2018   Procedure: TRANSESOPHAGEAL ECHOCARDIOGRAM (TEE);  Surgeon: Dorothy Spark, MD;  Location: [facility] ENDOSCOPY;  Service: Cardiovascular;  Laterality: N/A;  . THROAT SURGERY  2005  . TOTAL KNEE ARTHROPLASTY Right 09/12/2014   Procedure: TOTAL KNEE ARTHROPLASTY;  Surgeon: Meredith Pel, MD;  Location: [place];  Service: Orthopedics;  Laterality: Right;     Social History   reports that she has never smoked. She has never used smokeless tobacco. She reports that she does not drink alcohol or use drugs.   Family History   Her family history includes Breast cancer in her maternal aunt; Cancer in her mother; Cancer - Colon in her mother.   Allergies Allergies  Allergen Reactions  . Aciphex [Rabeprazole Sodium] Hives  . Coumadin [Warfarin Sodium] Other (See Comments)    Bleeding   . Erythromycin Nausea And Vomiting  . Lactose Intolerance (Gi) Diarrhea    Stomach pain  . Macrodantin Nausea And Vomiting  . Nsaids      Bleeding in bladder  . Atorvastatin Other (See Comments)    Body aches  . Sulfa Antibiotics Nausea Only and Other (See Comments)    Feel terrible  . Tape Rash     Home Medications  Prior to Admission medications   Medication Sig Start Date End Date Taking? Authorizing Provider  apixaban (ELIQUIS) 5 MG TABS tablet Take 1 tablet (5 mg total) by mouth 2 (two) times daily. 09/28/18  Yes Danford, Suann Larry, MD  ARTIFICIAL TEAR SOLUTION OP Place 1 drop into both eyes daily as needed (dry eyes).   Yes [provider]  buPROPion (WELLBUTRIN XL) 300 MG 24 hr tablet Take 1 tablet (300 mg total) by mouth daily. 01/22/17  Yes Dohmeier, Asencion Partridge, MD  cetirizine (ZYRTEC) 10 MG tablet Take 10 mg by mouth daily as needed for allergies.   Yes [provider]  clonazePAM (KLONOPIN) 0.5 MG tablet Take 0.5 mg by mouth 2 (two) times daily.   Yes [provider]  fluticasone (FLONASE) 50 MCG/ACT nasal spray Place 1 spray into both nostrils daily as needed for allergies.  08/20/17  Yes [provider]  levothyroxine (SYNTHROID, LEVOTHROID) 25 MCG tablet Take 25 mcg by mouth daily before breakfast.    Yes [provider]  metFORMIN (GLUCOPHAGE-XR) 500 MG 24 hr tablet Take 500 mg by mouth 2 (two) times daily.   Yes [provider]  montelukast (SINGULAIR) 10 MG tablet Take 10 mg by mouth daily as needed (allergies).  12/28/17  Yes [provider]  omeprazole (PRILOSEC) 20 MG capsule Take 20 mg by mouth every evening.   Yes [provider]  oxybutynin (DITROPAN-XL) 10 MG 24 hr tablet Take 10 mg by mouth every morning.  06/17/17  Yes [provider]  pravastatin (PRAVACHOL) 40 MG tablet Take 40 mg by mouth at bedtime.    Yes [provider]  traZODone (DESYREL) 100 MG tablet Take 100 mg by mouth at bedtime.    Yes [provider]  venlafaxine (EFFEXOR) 50 MG tablet Take 2 tablets (100 mg total) by mouth daily. 11/22/15  Yes  Dohmeier, Asencion Partridge, MD     Critical care time:     Lorella Nimrod PGY3 Pager [phone] or [phone]  12/12/2018 8:54 AM   Attending section: Appetite better.  Not having abdominal pain.  BP 116/66 (BP Location: Right Arm)   Pulse 81   Temp (!) 97.3 F (36.3 C) (Axillary)   Resp (!) 26   Wt 87 kg   SpO2 99%   BMI 32.92 kg/m   More alert.  HR regular.  No wheeze.  Abdomen soft.  LFTs improving.  A/p   Acute hepatitis. - cause unclear - seems to be improving - d/c NAC, ABx - f/u LFTs, INR - transfer to [facility] still pending > if she continues to improve, then might not need transfer  Chesley Mires, MD [place] 12/12/2018, 12:37 PM

## 2018-12-12 NOTE — Progress Notes (Signed)
[facility] Progress Note Patient Name: Amanda [name] DOB: May 07, 1946 MRN: [id number]   Date of Service  12/12/2018  HPI/Events of Note  K+ = 3.3 and Creatinine = 0.82.  eICU Interventions  Will replace K+.      Intervention Category Major Interventions: Electrolyte abnormality - evaluation and management  [name] 12/12/2018, 11:24 PM

## 2018-12-13 DIAGNOSIS — E876 Hypokalemia: Secondary | ICD-10-CM

## 2018-12-13 DIAGNOSIS — I340 Nonrheumatic mitral (valve) insufficiency: Secondary | ICD-10-CM

## 2018-12-13 LAB — GLUCOSE, CAPILLARY
Glucose-Capillary: 180 mg/dL — ABNORMAL HIGH (ref 70–99)
Glucose-Capillary: 123 mg/dL — ABNORMAL HIGH (ref 70–99)
Glucose-Capillary: 141 mg/dL — ABNORMAL HIGH (ref 70–99)
Glucose-Capillary: 178 mg/dL — ABNORMAL HIGH (ref 70–99)
Glucose-Capillary: 347 mg/dL — ABNORMAL HIGH (ref 70–99)
Glucose-Capillary: 226 mg/dL — ABNORMAL HIGH (ref 70–99)

## 2018-12-13 LAB — BLOOD GAS, ARTERIAL
Acid-Base Excess: 0.2 mmol/L (ref 0.0–2.0)
Bicarbonate: 23.8 mmol/L (ref 20.0–28.0)
Drawn by: 24513
FIO2: 100
O2 Saturation: 92.6 %
PH ART: 7.446 (ref 7.350–7.450)
Patient temperature: 98.6
pCO2 arterial: 35.2 mmHg (ref 32.0–48.0)
pO2, Arterial: 69.7 mmHg — ABNORMAL LOW (ref 83.0–108.0)

## 2018-12-13 LAB — COMPREHENSIVE METABOLIC PANEL
ALT: 749 U/L — ABNORMAL HIGH (ref 0–44)
AST: 187 U/L — ABNORMAL HIGH (ref 15–41)
Albumin: 3 g/dL — ABNORMAL LOW (ref 3.5–5.0)
Alkaline Phosphatase: 79 U/L (ref 38–126)
Anion gap: 7 (ref 5–15)
BUN: 22 mg/dL (ref 8–23)
CO2: 24 mmol/L (ref 22–32)
Calcium: 8.6 mg/dL — ABNORMAL LOW (ref 8.9–10.3)
Chloride: 101 mmol/L (ref 98–111)
Creatinine, Ser: 0.9 mg/dL (ref 0.44–1.00)
GFR calc Af Amer: >60 mL/min (ref >60)
GFR calc non Af Amer: >60 mL/min (ref >60)
Glucose, Bld: 155 mg/dL — ABNORMAL HIGH (ref 70–99)
Potassium: 3.3 mmol/L — ABNORMAL LOW (ref 3.5–5.1)
Sodium: 132 mmol/L — ABNORMAL LOW (ref 135–145)
Total Bilirubin: 2.3 mg/dL — ABNORMAL HIGH (ref 0.3–1.2)
Total Protein: 4.9 g/dL — ABNORMAL LOW (ref 6.5–8.1)

## 2018-12-13 LAB — MPO/PR-3 (ANCA) ANTIBODIES
ANCA Proteinase 3: <3.5 U/mL (ref 0.0–3.5)
Myeloperoxidase Abs: <9 U/mL (ref 0.0–9.0)

## 2018-12-13 LAB — CBC
HCT: 36.2 % (ref 36.0–46.0)
Hemoglobin: 11.5 g/dL — ABNORMAL LOW (ref 12.0–15.0)
MCH: 28.1 pg (ref 26.0–34.0)
MCHC: 31.8 g/dL (ref 30.0–36.0)
MCV: 88.5 fL (ref 80.0–100.0)
Platelets: 195 10*3/uL (ref 150–400)
RBC: 4.09 MIL/uL (ref 3.87–5.11)
RDW: 15.6 % — AB (ref 11.5–15.5)
WBC: 13.9 10*3/uL — ABNORMAL HIGH (ref 4.0–10.5)
nRBC: 0.4 % — ABNORMAL HIGH (ref 0.0–0.2)

## 2018-12-13 LAB — BPAM RBC
Blood Product Expiration Date: 202002082359
Unit Type and Rh: 6200

## 2018-12-13 LAB — TYPE AND SCREEN
ABO/RH(D): A POS
Antibody Screen: NEGATIVE
Unit division: 0

## 2018-12-13 LAB — CK: Total CK: 290 U/L — ABNORMAL HIGH (ref 38–234)

## 2018-12-13 LAB — PROTIME-INR
INR: 1.75
Prothrombin Time: 20.2 seconds — ABNORMAL HIGH (ref 11.4–15.2)

## 2018-12-13 LAB — MAGNESIUM: Magnesium: 1.8 mg/dL (ref 1.7–2.4)

## 2018-12-13 MED ORDER — BISACODYL 10 MG RE SUPP: 10.0000 mg | Freq: Every day | RECTAL | Status: DC | PRN

## 2018-12-13 MED ORDER — SENNOSIDES-DOCUSATE SODIUM 8.6-50 MG PO TABS
1.0000 | ORAL_TABLET | Freq: Two times a day (BID) | ORAL | Status: DC
  Administered 2018-12-13 – 2018-12-20 (×13): 1 via ORAL
  Filled 2018-12-13 (×15): qty 1

## 2018-12-13 MED ORDER — CLONAZEPAM 0.5 MG PO TABS
0.5000 mg | ORAL_TABLET | Freq: Two times a day (BID) | ORAL | Status: DC
  Administered 2018-12-13 – 2018-12-14 (×3): 0.5 mg via ORAL
  Filled 2018-12-13 (×3): qty 1

## 2018-12-13 MED ORDER — LACTULOSE 10 GM/15ML PO SOLN
30.0000 g | Freq: Three times a day (TID) | ORAL | Status: DC
  Administered 2018-12-14 (×3): 30 g via ORAL
  Filled 2018-12-13 (×3): qty 45

## 2018-12-13 MED ORDER — POTASSIUM CHLORIDE 10 MEQ/50ML IV SOLN
10.0000 meq | INTRAVENOUS | Status: AC
  Administered 2018-12-13 (×4): 10 meq via INTRAVENOUS
  Filled 2018-12-13 (×4): qty 50

## 2018-12-13 MED ORDER — CLONAZEPAM 0.5 MG PO TBDP
0.5000 mg | ORAL_TABLET | Freq: Once | ORAL | Status: AC
  Administered 2018-12-13: 0.5 mg via ORAL
  Filled 2018-12-13: qty 1

## 2018-12-13 MED ORDER — FLEET ENEMA 7-19 GM/118ML RE ENEM: 1.0000 | ENEMA | Freq: Every day | RECTAL | Status: DC | PRN

## 2018-12-13 NOTE — Plan of Care (Signed)

## 2018-12-13 NOTE — Plan of Care (Signed)

## 2018-12-13 NOTE — Progress Notes (Signed)
12/13/2018 1820 hrs: Attempted to call report X 1 to 3E. Left a call-back number.

## 2018-12-13 NOTE — Progress Notes (Signed)
Progress Note  Patient Name: Amanda [name] Date of Encounter: 12/13/2018  Primary Cardiologist: Ena Dawley, MD    Subjective   Hypokalemic overnight. Afib rate-controlled this am, but has had RVR at times.  AST/ALT are improving.   Inpatient Medications    Scheduled Meds: . clonazePAM  0.5 mg Oral BID  . diltiazem  30 mg Oral Q8H  . feeding supplement  1 Container Oral TID BM  . insulin aspart  0-15 Units Subcutaneous Q4H  . insulin glargine  10 Units Subcutaneous QHS  . lactulose  30 g Oral BID  . levothyroxine  25 mcg Oral QAC breakfast  . mouth rinse  15 mL Mouth Rinse BID  . metoprolol tartrate  50 mg Oral BID  . senna-docusate  1 tablet Oral BID   Continuous Infusions: . sodium chloride 50 mL/hr at 12/12/18 2000  . potassium chloride 10 mEq (12/13/18 0826)   PRN Meds: bisacodyl, ondansetron, polyethylene glycol, sodium phosphate, traMADol   Vital Signs    Vitals:   12/13/18 0500 12/13/18 0600 12/13/18 0700 12/13/18 0800  BP: 128/68 (!) 148/100 (!) 133/100 134/81  Pulse: 60 95 84 61  Resp: (!) 22 (!) 27 (!) 21 19  Temp:      TempSrc:      SpO2: 99% 96% 96% 99%  Weight: 87.2 kg       Intake/Output Summary (Last 24 hours) at 12/13/2018 0900 Last data filed at 12/13/2018 0800 Gross per 24 hour  Intake 392.44 ml  Output 675 ml  Net -282.56 ml   Last 3 Weights 12/13/2018 12/12/2018 12/11/2018  Weight (lbs) 192 lb 3.9 oz 191 lb 12.8 oz 191 lb 12.8 oz  Weight (kg) 87.2 kg 87 kg 87 kg      Telemetry    afib with CVR  - Personally Reviewed  ECG    N/A  Physical Exam   General appearance: alert, icteric and mild distress Neck: no carotid bruit, no JVD and thyroid not enlarged, symmetric, no tenderness/mass/nodules Lungs: clear to auscultation bilaterally Heart: irregularly irregular rhythm Abdomen: protuberant, firm, mild TTP in RLQ Extremities: edema anasarcic Pulses: 2+ and symmetric Skin: pale, ?mild jaundice Neurologic: Mental status:  Alert, oriented, thought content appropriate, somewhat slow thought and speech Psych: In pain  Labs    Chemistry Recent Labs  Lab 12/11/18 0344 12/12/18 0354 12/12/18 1243 12/12/18 1402 12/12/18 2045 12/13/18 0503  NA 136 137 133*  --   --  132*  K 2.8* <2.0* 2.1* 2.7* 3.3* 3.3*  CL 96* 98 98  --   --  101  CO2 23 26 23   --   --  24  GLUCOSE 204* 157* 330*  --   --  155*  BUN 28* 23 22  --   --  22  CREATININE 1.08* 0.83 0.82  --   --  0.90  CALCIUM 8.5* 8.4* 8.3*  --   --  8.6*  PROT 5.3* 4.8*  --   --   --  4.9*  ALBUMIN 3.3* 2.9*  --   --   --  3.0*  AST 2,219* 564*  --   --   --  187*  ALT 1,648* 1,032*  --   --   --  749*  ALKPHOS 78 72  --   --   --  79  BILITOT 2.7* 2.1*  --   --   --  2.3*  GFRNONAA 51* >60 >60  --   --  >60  GFRAA 59* >60 >60  --   --  >60  ANIONGAP 17* 13 12  --   --  7     Hematology Recent Labs  Lab 12/11/18 0344 12/12/18 0354 12/13/18 0503  WBC 15.5* 14.3* 13.9*  RBC 3.90 3.91 4.09  HGB 11.0* 10.9* 11.5*  HCT 34.4* 34.1* 36.2  MCV 88.2 87.2 88.5  MCH 28.2 27.9 28.1  MCHC 32.0 32.0 31.8  RDW 14.5 14.8 15.6*  PLT 217 196 195    Cardiac EnzymesNo results for input(s): TROPONINI in the last 168 hours. No results for input(s): TROPIPOC in the last 168 hours.   BNPNo results for input(s): BNP, PROBNP in the last 168 hours.   DDimer No results for input(s): DDIMER in the last 168 hours.   Radiology    Dg Chest Port 1 View  Result Date: 12/12/2018 CLINICAL DATA:  Hypoxia EXAM: PORTABLE CHEST 1 VIEW COMPARISON:  Chest x-rays dated 12/10/2018 and 12/09/2018. FINDINGS: Stable cardiomegaly. LEFT IJ central line is stable in position with tip overlying the RIGHT atrium. New opacity at the LEFT lung base, atelectasis and/or small pleural effusion. RIGHT lung remains clear. No pneumothorax seen. IMPRESSION: New atelectasis and/or small pleural effusion at the LEFT lung base. Electronically Signed   By: Franki Cabot M.D.   On: 12/12/2018  06:31    Cardiac Studies   TEE EF 50-55% severe MR   Patient Profile     [name] is a 73 y.o. female with a hx of hypertension, hyperlipidemia, hypothyroidism, atrial fibrillation s/p DCCV 09/2018 on Eliquis, OSA on CPAP and frequent falls who is being seen today for the evaluation of atrial fibrillation with RVR at the request of Dr. Reesa Chew.  Assessment & Plan    Afib:  Rate control adequate no anticoagulation given liver failure continue oral short-acting cardizem no history of CAD and EF low normal by TTE, however, severe MR noted.  MR:  Severe by TEE not a candidate for intervention may be clip candidate if she recovers from  Hepatic failure - she is +8L positive (+10 KG since admit) - Would consider d/c IVF's today and recommend starting diuresis per PCCM. Although LVEF low normal, there is reduced cardiac output in the setting of afib and severe MR.  Hepatic failure acetylcystine and LR hydration - not sure she needs transfer to [facility]. Etiology unclear - ?overdose-toxin, vs CBD stone? Complains of abdominal pain and constipation today - would benefit from GI evaluation.   For questions or updates, please contact [place] Please consult [email or web address] for contact info under   Pixie Casino, MD, FACC, [place] Director of the [facility]  [facility] Diplomate of the American Board of Clinical Lipidology Attending Cardiologist  Direct Dial: [phone]  Fax: [phone]  Website:  [email or web address]  Pixie Casino, MD  12/13/2018, 9:00 AM

## 2018-12-13 NOTE — Evaluation (Addendum)
Physical Therapy Evaluation Patient Details Name: Amanda [name] MRN: [id number] DOB: [birth year] Today's Date: 12/13/2018   History of Present Illness  73 y.o. female admitted on 12/09/18 for acute liver failure, acute kidney injury, metabolic acidosis, rhabdomyolysis, acute hepatitis/transaminitis, lactic acidosis, A-fib with RVR, severe coagulopathy, and hypokalemia.  Pt with significant PMH of vertigo, MVP, HTN, DM2, anxiety, anemia, A-fib, bil TKA, throat surgery, R leg fracture surgery.    Clinical Impression  Pt is confused, telling this therapist she feels like she is about to "pass out and die".  Pt sitting up in the chair yelling for help and BP 102/47 (feet down sitting fully upright).  Pt reclined back in chair and feet up and BP returned to 135/74 and stayed in the 130s/70s for the rest of her session.  Pt's HR in A-fib, but mostly in the 60s and 70s and O2 sats on 2 L were 100%.  She was not oriented to year or situation and PT felt it safest to lift back to bed instead of stand.  Further assessment of gait and mobility needed.  PT will continue to follow acutely for safe mobility progression     Follow Up Recommendations SNF    Equipment Recommendations  None recommended by PT    Recommendations for Other Services   NA    Precautions / Restrictions Precautions Precautions: Fall Restrictions Weight Bearing Restrictions: No      Mobility  Bed Mobility Overal bed mobility: Needs Assistance Bed Mobility: Rolling Rolling: Mod assist         General bed mobility comments: Mod assist to roll bil in the chair to get on lift pad to get back to bed.  Pt's BP is low in sitting so we decided it would be safer back to bed with the maxi sky lift.   Transfers Overall transfer level: Needs assistance               General transfer comment: Per RN pt transferred OOB to the recliner chair with two person hand held assist earlier.             Pertinent Vitals/Pain  Pain Assessment: No/denies pain    Home Living Family/patient expects to be discharged to:: Private residence Living Arrangements: Alone   Type of Home: House Home Access: Stairs to enter Entrance Stairs-Rails: None Entrance Stairs-Number of Steps: 1 Home Layout: Two level Home Equipment: Environmental consultant - 2 wheels Additional Comments: information from previous admission, will need to check for accuracy    Prior Function Level of Independence: Independent with assistive device(s)         Comments: uses RW prn, drives, has lifeline        Extremity/Trunk Assessment   Upper Extremity Assessment Upper Extremity Assessment: Generalized weakness    Lower Extremity Assessment Lower Extremity Assessment: Generalized weakness(h/o bil TKA)    Cervical / Trunk Assessment Cervical / Trunk Assessment: Other exceptions Cervical / Trunk Exceptions: weak, cues to hold her head up during session.  Communication   Communication: No difficulties  Cognition Arousal/Alertness: Lethargic Behavior During Therapy: Restless;Anxious Overall Cognitive Status: Impaired/Different from baseline Area of Impairment: Orientation;Attention;Memory;Following commands;Safety/judgement;Problem solving;Awareness                 Orientation Level: Disoriented to;Time;Situation Current Attention Level: Sustained   Following Commands: Follows one step commands consistently;Follows one step commands with increased time Safety/Judgement: Decreased awareness of safety;Decreased awareness of deficits Awareness: Intellectual Problem Solving: Slow processing;Decreased initiation;Difficulty sequencing;Requires verbal cues;Requires tactile cues  General Comments: Pt unable to tell us the year (1941), or why she is in the hospital.  She is able to state her name and seems anxious reporting, "I am going to die!" and "I feel like passing out" yelling "help me!" into the hallway.       General Comments General  comments (skin integrity, edema, etc.): BP sitting in the chair is 102/47 and once reclined in the chair is 135/74        Assessment/Plan    PT Assessment Patient needs continued PT services  PT Problem List Decreased strength;Decreased activity tolerance;Decreased balance;Decreased mobility;Decreased cognition;Decreased safety awareness;Decreased knowledge of use of DME;Decreased knowledge of precautions;Cardiopulmonary status limiting activity       PT Treatment Interventions DME instruction;Gait training;Functional mobility training;Therapeutic activities;Therapeutic exercise;Stair training;Balance training;Patient/family education;Cognitive remediation    PT Goals (Current goals can be found in the Care Plan section)  Acute Rehab PT Goals Patient Stated Goal: unable to state, confused PT Goal Formulation: Patient unable to participate in goal setting Time For Goal Achievement: 12/27/18 Potential to Achieve Goals: Good    Frequency Min 2X/week   Barriers to discharge Decreased caregiver support per chart she lives alone       AM-PAC PT "6 Clicks" Mobility  Outcome Measure Help needed turning from your back to your side while in a flat bed without using bedrails?: A Lot Help needed moving from lying on your back to sitting on the side of a flat bed without using bedrails?: A Lot Help needed moving to and from a bed to a chair (including a wheelchair)?: A Lot Help needed standing up from a chair using your arms (e.g., wheelchair or bedside chair)?: A Lot Help needed to walk in hospital room?: Total Help needed climbing 3-5 steps with a railing? : Total 6 Click Score: 10    End of Session Equipment Utilized During Treatment: Oxygen(2 L O2 [place]) Activity Tolerance: Patient limited by fatigue;Patient limited by lethargy Patient left: in bed;with call bell/phone within reach;with bed alarm set Nurse Communication: Mobility status PT Visit Diagnosis: Muscle weakness (generalized)  (M62.81);Difficulty in walking, not elsewhere classified (R26.2)    Time: 9735-3299 PT Time Calculation (min) (ACUTE ONLY): 25 min   Charges:           Wells Guiles B. Korra Christine, PT, DPT  Acute Rehabilitation [phone] pager #[phone] office   PT Evaluation $PT Eval Moderate Complexity: 1 Mod PT Treatments $Therapeutic Activity: 8-22 mins       12/13/2018, 1:41 PM

## 2018-12-13 NOTE — Progress Notes (Signed)
[facility] Progress Note Patient Name: Amanda [name] DOB: 27-May-1946 MRN: [id number]   Date of Service  12/13/2018  HPI/Events of Note  Request for anxiety/sleep aid - Patient on Klonopin at home.  eICU Interventions  Will order: 1. Klonopin 0.5 mg PO X 1 now.      Intervention Category Minor Interventions: Agitation / anxiety - evaluation and management  Amanda [name] 12/13/2018, 2:11 AM

## 2018-12-13 NOTE — Progress Notes (Signed)
NAME:  Amanda [name], MRN:  [id number], DOB:  02/05/1946, LOS: 4 ADMISSION DATE:  12/09/2018, CONSULTATION DATE:  1/23 REFERRING MD:  Dr. 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  History of present illness   73 year old female with past medical history as below, which is significant for atrial fibrillation on Eliquis, OSA on CPAP, hypertension, diabetes, and frequent falls.  Atrial fibrillation was her most recent diagnosis back in November 2019 and she was started on metoprolol and Eliquis at that time.  In reviewing her cardiology outpatient notes it seems that she is followed her medication regimen pretty strictly, however, in the months since her diagnosis she has frequently been in A. fib and has felt very "beat down"as a result.    Upon presenting to [facility] emergency department on 1/23 she complained of a 4 to 5-day history of nausea and vomiting.  Emesis described as clear mucus.  The symptoms did improve after the first 2 days, however, she continued to be weak and dizzy up until her presentation.  She denies having abdominal pain, hematemesis, diarrhea, constipation, hematochezia, cough, fevers, chills.  Describes normal bowel movements.  She seems to be a good historian and the history she gave seem to match up with chart review, however, she then told me she had been in the hospital for the last 2 days which is untrue.  She also describes having been on Eliquis for only a couple of weeks when it looks like this was started back in November 2019.   Laboratory evaluation in the emergency department was significant for AST 3257, ALT 1690, total bilirubin 3.6, CK 1150, lactic acid 10.4, potassium 5.7, bicarbonate 11, WBC 15.2, INR 6.81.  CT scan of the abdomen and pelvis demonstrated a possible mild pancreatitis with a small right pleural effusion.  She was treated  with ceftriaxone and Flagyl.  And was loaded with acetylcysteine and continued on an infusion.  Given the critical nature of her illness PCCM was asked to admit.  The emergency physician has contacted the hospital to [facility] for transfer.  She has been accepted, but is awaiting bed assignment.   Past Medical History   has a past medical history of A-fib ([place]), Anemia due to blood loss, Anxiety, Arthritis, Asthma, Complication of anesthesia, Depression, Diabetes type 2, controlled ([place]), GERD (gastroesophageal reflux disease), Headache, Heart murmur, Hepatitis, Hyperlipidemia, Hypertension, Hypothyroidism, Mitral valve prolapse, OSA on CPAP, Pneumonia, and Vertigo.  Significant Hospital Events   1/23 admit  Consults:    Procedures:    Significant Diagnostic Tests:  CT abd, pelv 1/23 > Subtle hazy attenuation of the peripancreatic fat in the region of the head of the pancreas/common bile duct which could be seen due to mild acute pancreatitis. Recommend clinical correlation. Small right pleural effusion with mild dependent right basilar atelectasis. Bilateral renal cysts unchanged.   Micro Data:  Blood 1/23 >Negative Urine 1/23 >Negative  Antimicrobials:  CTX 1/23 > Flagyl 1/23 >  Interim history/subjective:  Patient was little frustrated and agitated this morning.  Complaining of constipation and abdominal bloatedness, also wants to restart her home dose of Klonopin.  Objective   Blood pressure 130/76, pulse 76, temperature (!) 96.2 F (35.7 C), temperature source Axillary, resp. rate (!) 26, weight 87.2 kg, SpO2 97 %.        Intake/Output Summary (Last 24 hours)  at 12/13/2018 1411 Last data filed at 12/13/2018 1200 Gross per 24 hour  Intake 200.03 ml  Output 595 ml  Net -394.97 ml   Filed Weights   12/11/18 0500 12/12/18 0500 12/13/18 0500  Weight: 87 kg 87 kg 87.2 kg    Examination: General: Well developed, elderly female in no acute  distress HENT: [place]/At, PERRL, No appreciable JVD Lungs: Decreased breath sounds at bases. Cardiovascular: Irregular with normal rate, no MRG Abdomen: Soft, non-tender, mildly distended.  Bowel sounds positive. Extremities: No acute deformity or ROM limitation. No edema.  Neuro: Alert, oriented x 3 and non-focal.   Resolved Hospital Problem list      Assessment & Plan:   Hepatic failure: Etiology unclear. Associated hepatic coagulopathy. Acute hepatitis panel negative. Liver functions and INR improving. Autoimmune profile negative. -Increase lactulose to 30 mg to TID. -Gave her 1 soapsuds enema. - Trend LFT, Coags - Awaiting transfer to [facility]-might not need.  AKI: likely secondary to rhabdomyolysis.  Creatinine has been normalized, CK improving.  Hypokalemia. Potassium at 3.3, repleting. -Monitor BMP.    Anion gap metabolic acidosis: Resolved Lactic acidosis resolved.  Possible pancreatitis on CT: Lipase on admission 22 - Supportive care.    Atrial fibrillation with RVR. Cardiology is following-we appreciate their recommendation. [place] Vasc score of 3-we will hold anticoagulation due to her hepatic dysfunction -Continue p.o. diltiazem and metoprolol. - Telemetry monitoring - Monitor for now  DM - SSI  OSA on CPAP -CPAP at night.  Hypothyroid TSH wnl - continue synthroid  Best practice:  Diet: Heart healthy Pain/Anxiety/Delirium protocol (if indicated): na VAP protocol (if indicated): na DVT prophylaxis: Coagulopathic GI prophylaxis: not yet indicated Glucose control: SSI Mobility: BR  Code Status: FULL Family Communication: Patient updated.  Disposition: Can be transferred out to telemetry.  Labs   CBC: Recent Labs  Lab 12/09/18 1613  12/10/18 0216 12/10/18 0556 12/11/18 0344 12/12/18 0354 12/13/18 0503  WBC 15.2*  --   --  18.1* 15.5* 14.3* 13.9*  NEUTROABS 12.2*  --   --   --   --   --   --   HGB 13.0   < > 11.6* 10.0* 11.0* 10.9* 11.5*  HCT  43.8   < > 34.0* 32.5* 34.4* 34.1* 36.2  MCV 93.4  --   --  90.5 88.2 87.2 88.5  PLT 294  --   --  197 217 196 195   < > = values in this interval not displayed.    Basic Metabolic Panel: Recent Labs  Lab 12/09/18 1613  12/10/18 0556 12/11/18 0344 12/12/18 0354 12/12/18 0800 12/12/18 1243 12/12/18 1402 12/12/18 2045 12/13/18 0503  NA 135   < > 137 136 137  --  133*  --   --  132*  K 5.7*   < > 4.0 2.8* <2.0*  --  2.1* 2.7* 3.3* 3.3*  CL 100   < > 102 96* 98  --  98  --   --  101  CO2 11*   < > 14* 23 26  --  23  --   --  24  GLUCOSE 81   < > 224* 204* 157*  --  330*  --   --  155*  BUN 29*   < > 32* 28* 23  --  22  --   --  22  CREATININE 1.91*   < > 1.63* 1.08* 0.83  --  0.82  --   --  0.90  CALCIUM  9.6   < > 8.4* 8.5* 8.4*  --  8.3*  --   --  8.6*  MG 2.0  --  1.7 1.7  --  1.9  --   --   --  1.8   < > = values in this interval not displayed.   GFR: Estimated Creatinine Clearance: 59.5 mL/min (by C-G formula based on SCr of 0.9 mg/dL). Recent Labs  Lab 12/10/18 0548 12/10/18 0556 12/10/18 1348 12/10/18 1845 12/11/18 0344 12/12/18 0354 12/12/18 0800 12/13/18 0503  WBC  --  18.1*  --   --  15.5* 14.3*  --  13.9*  LATICACIDVEN 6.2*  --  6.8* 4.1*  --   --  1.6  --     Liver Function Tests: Recent Labs  Lab 12/09/18 2355 12/10/18 0556 12/11/18 0344 12/12/18 0354 12/13/18 0503  AST 2,772* 2,934* 2,219* 564* 187*  ALT 1,822* 1,603* 1,648* 1,032* 749*  ALKPHOS 96 75 78 72 79  BILITOT 3.2* 2.9* 2.7* 2.1* 2.3*  PROT 5.5* 5.8* 5.3* 4.8* 4.9*  ALBUMIN 3.1* 3.8 3.3* 2.9* 3.0*   Recent Labs  Lab 12/09/18 1613  LIPASE 22   Recent Labs  Lab 12/10/18 0730  AMMONIA 44*    ABG    Component Value Date/Time   PHART 7.446 12/11/2018 0455   PCO2ART 35.2 12/11/2018 0455   PO2ART 69.7 (L) 12/11/2018 0455   HCO3 23.8 12/11/2018 0455   TCO2 12 (L) 12/10/2018 0216   ACIDBASEDEF 14.0 (H) 12/10/2018 0216   O2SAT 92.6 12/11/2018 0455     Coagulation  Profile: Recent Labs  Lab 12/10/18 1645 12/11/18 0344 12/11/18 1638 12/12/18 0800 12/13/18 0707  INR 9.75* 6.71* 3.68 2.52 1.75    Cardiac Enzymes: Recent Labs  Lab 12/09/18 1950 12/10/18 0556 12/11/18 0344 12/12/18 0800 12/13/18 0503  CKTOTAL 1,150* 2,661* 3,197* 777* 290*    HbA1C: Hgb A1c MFr Bld  Date/Time Value Ref Range Status  10/12/2014 10:14 AM 5.9 (H) <5.7 % Final    Comment:    (NOTE)                                                                       According to the ADA Clinical Practice Recommendations for 2011, when HbA1c is used as a screening test:  >=6.5%   Diagnostic of Diabetes Mellitus           (if abnormal result is confirmed) 5.7-6.4%   Increased risk of developing Diabetes Mellitus References:Diagnosis and Classification of Diabetes Mellitus,Diabetes WUJW,1191,47(WGNFA 1):S62-S69 and Standards of Medical Care in         Diabetes - 2011,Diabetes Care,2011,34 (Suppl 1):S11-S61.     CBG: Recent Labs  Lab 12/12/18 2029 12/12/18 2335 12/13/18 0349 12/13/18 0827 12/13/18 1140  GLUCAP 175* 139* 180* 123* 141*    Review of Systems:   Bolds are positive  Constitutional: weight loss, gain, night sweats, Fevers, chills, fatigue .  HEENT: headaches, Sore throat, sneezing, nasal congestion, post nasal drip, Difficulty swallowing, Tooth/dental problems, visual complaints visual changes, ear ache CV:  chest pain, radiates:,Orthopnea, PND, swelling in lower extremities, dizziness, palpitations, syncope.  GI  heartburn, indigestion, abdominal pain, nausea, vomiting, diarrhea, change in bowel habits, loss of appetite, bloody stools.  Resp: cough,  productive:, hemoptysis, dyspnea, chest pain, pleuritic.  Skin: rash or itching or icterus GU: dysuria, change in color of urine, urgency or frequency. flank pain, hematuria  MS: joint pain or swelling. decreased range of motion  Psych: change in mood or affect. depression or anxiety.  Neuro: difficulty  with speech, weakness, numbness, ataxia    Past Medical History  She,  has a past medical history of A-fib ([place]), Anemia due to blood loss, Anxiety, Arthritis, Asthma, Complication of anesthesia, Depression, Diabetes type 2, controlled ([place]), GERD (gastroesophageal reflux disease), Headache, Heart murmur, Hepatitis, Hyperlipidemia, Hypertension, Hypothyroidism, Mitral valve prolapse, OSA on CPAP, Pneumonia, and Vertigo.   Surgical History    Past Surgical History:  Procedure Laterality Date  . ABDOMINAL HYSTERECTOMY    . CARDIOVERSION N/A 10/11/2018   Procedure: CARDIOVERSION;  Surgeon: Dorothy Spark, MD;  Location: [facility] ENDOSCOPY;  Service: Cardiovascular;  Laterality: N/A;  . CHOLECYSTECTOMY    . FRACTURE SURGERY Right    leg  . GALLBLADDER SURGERY  1995  . JOINT REPLACEMENT Left    knee  . NASAL SINUS SURGERY  2005  . PARTIAL HYSTERECTOMY  1983  . TEE WITHOUT CARDIOVERSION N/A 10/11/2018   Procedure: TRANSESOPHAGEAL ECHOCARDIOGRAM (TEE);  Surgeon: Dorothy Spark, MD;  Location: [facility] ENDOSCOPY;  Service: Cardiovascular;  Laterality: N/A;  . THROAT SURGERY  2005  . TOTAL KNEE ARTHROPLASTY Right 09/12/2014   Procedure: TOTAL KNEE ARTHROPLASTY;  Surgeon: Meredith Pel, MD;  Location: [facility];  Service: Orthopedics;  Laterality: Right;     Social History   reports that she has never smoked. She has never used smokeless tobacco. She reports that she does not drink alcohol or use drugs.   Family History   Her family history includes Breast cancer in her maternal aunt; Cancer in her mother; Cancer - Colon in her mother.   Allergies Allergies  Allergen Reactions  . Aciphex [Rabeprazole Sodium] Hives  . Coumadin [Warfarin Sodium] Other (See Comments)    Bleeding   . Erythromycin Nausea And Vomiting  . Lactose Intolerance (Gi) Diarrhea    Stomach pain  . Macrodantin Nausea And Vomiting  . Nsaids     Bleeding in bladder  . Atorvastatin Other (See Comments)    Body aches   . Sulfa Antibiotics Nausea Only and Other (See Comments)    Feel terrible  . Tape Rash     Home Medications  Prior to Admission medications   Medication Sig Start Date End Date Taking? Authorizing Provider  apixaban (ELIQUIS) 5 MG TABS tablet Take 1 tablet (5 mg total) by mouth 2 (two) times daily. 09/28/18  Yes Danford, Suann Larry, MD  ARTIFICIAL TEAR SOLUTION OP Place 1 drop into both eyes daily as needed (dry eyes).   Yes [provider]  buPROPion (WELLBUTRIN XL) 300 MG 24 hr tablet Take 1 tablet (300 mg total) by mouth daily. 01/22/17  Yes Dohmeier, Asencion Partridge, MD  cetirizine (ZYRTEC) 10 MG tablet Take 10 mg by mouth daily as needed for allergies.   Yes [provider]  clonazePAM (KLONOPIN) 0.5 MG tablet Take 0.5 mg by mouth 2 (two) times daily.   Yes [provider]  fluticasone (FLONASE) 50 MCG/ACT nasal spray Place 1 spray into both nostrils daily as needed for allergies.  08/20/17  Yes [provider]  levothyroxine (SYNTHROID, LEVOTHROID) 25 MCG tablet Take 25 mcg by mouth daily before breakfast.    Yes [provider]  metFORMIN (GLUCOPHAGE-XR) 500 MG 24 hr tablet Take  500 mg by mouth 2 (two) times daily.   Yes [provider]  montelukast (SINGULAIR) 10 MG tablet Take 10 mg by mouth daily as needed (allergies).  12/28/17  Yes [provider]  omeprazole (PRILOSEC) 20 MG capsule Take 20 mg by mouth every evening.   Yes [provider]  oxybutynin (DITROPAN-XL) 10 MG 24 hr tablet Take 10 mg by mouth every morning.  06/17/17  Yes [provider]  pravastatin (PRAVACHOL) 40 MG tablet Take 40 mg by mouth at bedtime.    Yes [provider]  traZODone (DESYREL) 100 MG tablet Take 100 mg by mouth at bedtime.    Yes [provider]  venlafaxine (EFFEXOR) 50 MG tablet Take 2 tablets (100 mg total) by mouth daily. 11/22/15  Yes Dohmeier, Asencion Partridge, MD     Critical care time:     Lorella Nimrod  PGY3 Pager [phone] or [phone]  12/13/2018 2:11 PM

## 2018-12-13 NOTE — Progress Notes (Signed)
[facility] Progress Note Patient Name: Amanda [name] DOB: 24-Sep-1946 MRN: [id number]   Date of Service  12/13/2018  HPI/Events of Note  K+ = 3.3 and Creatinine = 0.90.   eICU Interventions  Will order: 1. Replace K+.     Intervention Category Major Interventions: Electrolyte abnormality - evaluation and management  Sommer,Steven Eugene 12/13/2018, 7:01 AM

## 2018-12-14 ENCOUNTER — Ambulatory Visit (HOSPITAL_COMMUNITY): Payer: Medicare Other | Admitting: Nurse Practitioner

## 2018-12-14 DIAGNOSIS — K7201 Acute and subacute hepatic failure with coma: Secondary | ICD-10-CM

## 2018-12-14 DIAGNOSIS — E870 Hyperosmolality and hypernatremia: Secondary | ICD-10-CM

## 2018-12-14 DIAGNOSIS — E871 Hypo-osmolality and hyponatremia: Secondary | ICD-10-CM

## 2018-12-14 LAB — COMPREHENSIVE METABOLIC PANEL
ALT: 528 U/L — ABNORMAL HIGH (ref 0–44)
AST: 88 U/L — ABNORMAL HIGH (ref 15–41)
Albumin: 3 g/dL — ABNORMAL LOW (ref 3.5–5.0)
Alkaline Phosphatase: 69 U/L (ref 38–126)
Anion gap: 11 (ref 5–15)
BILIRUBIN TOTAL: 1.8 mg/dL — AB (ref 0.3–1.2)
BUN: 27 mg/dL — ABNORMAL HIGH (ref 8–23)
CO2: 23 mmol/L (ref 22–32)
Calcium: 9 mg/dL (ref 8.9–10.3)
Chloride: 95 mmol/L — ABNORMAL LOW (ref 98–111)
Creatinine, Ser: 0.95 mg/dL (ref 0.44–1.00)
GFR calc Af Amer: >60 mL/min (ref >60)
GFR calc non Af Amer: 59 mL/min — ABNORMAL LOW (ref >60)
Glucose, Bld: 141 mg/dL — ABNORMAL HIGH (ref 70–99)
Potassium: 4 mmol/L (ref 3.5–5.1)
Sodium: 129 mmol/L — ABNORMAL LOW (ref 135–145)
Total Protein: 5.3 g/dL — ABNORMAL LOW (ref 6.5–8.1)

## 2018-12-14 LAB — GLUCOSE, CAPILLARY
GLUCOSE-CAPILLARY: 113 mg/dL — AB (ref 70–99)
GLUCOSE-CAPILLARY: 209 mg/dL — AB (ref 70–99)
Glucose-Capillary: 122 mg/dL — ABNORMAL HIGH (ref 70–99)
Glucose-Capillary: 138 mg/dL — ABNORMAL HIGH (ref 70–99)
Glucose-Capillary: 182 mg/dL — ABNORMAL HIGH (ref 70–99)
Glucose-Capillary: 184 mg/dL — ABNORMAL HIGH (ref 70–99)
Glucose-Capillary: 214 mg/dL — ABNORMAL HIGH (ref 70–99)
Glucose-Capillary: 277 mg/dL — ABNORMAL HIGH (ref 70–99)

## 2018-12-14 LAB — ANCA TITERS
Atypical P-ANCA titer: <1:20 {titer}
C-ANCA: <1:20 {titer}
P-ANCA: <1:20 {titer}

## 2018-12-14 LAB — CULTURE, BLOOD (ROUTINE X 2)
CULTURE: NO GROWTH
Culture: NO GROWTH

## 2018-12-14 LAB — CBC
HCT: 36.5 % (ref 36.0–46.0)
Hemoglobin: 11.5 g/dL — ABNORMAL LOW (ref 12.0–15.0)
MCH: 27.8 pg (ref 26.0–34.0)
MCHC: 31.5 g/dL (ref 30.0–36.0)
MCV: 88.2 fL (ref 80.0–100.0)
Platelets: 183 10*3/uL (ref 150–400)
RBC: 4.14 MIL/uL (ref 3.87–5.11)
RDW: 15.9 % — ABNORMAL HIGH (ref 11.5–15.5)
WBC: 15.5 10*3/uL — ABNORMAL HIGH (ref 4.0–10.5)
nRBC: 0.5 % — ABNORMAL HIGH (ref 0.0–0.2)

## 2018-12-14 LAB — PROTIME-INR
INR: 1.48
Prothrombin Time: 17.7 seconds — ABNORMAL HIGH (ref 11.4–15.2)

## 2018-12-14 LAB — AMMONIA: Ammonia: 47 umol/L — ABNORMAL HIGH (ref 9–35)

## 2018-12-14 MED ORDER — FUROSEMIDE 10 MG/ML IJ SOLN
40.0000 mg | Freq: Two times a day (BID) | INTRAMUSCULAR | Status: AC
  Administered 2018-12-14 – 2018-12-15 (×4): 40 mg via INTRAVENOUS
  Filled 2018-12-14 (×4): qty 4

## 2018-12-14 NOTE — Clinical Social Work Note (Signed)
Clinical Social Work Assessment  Patient Details  Name: Amanda [name] MRN: [id number] Date of Birth: [birth year]  Date of referral:  12/14/18               Reason for consult:  Facility Placement, Discharge Planning                Permission sought to share information with:  Facility Sport and exercise psychologist, Family Supports Permission granted to share information::     Name::     Agricultural engineer::  SNF's  Relationship::  Daughter  Contact Information:  [phone]  Housing/Transportation Living arrangements for the past 2 months:  [place] of Information:  Medical Team, Adult Children Patient Interpreter Needed:  None Criminal Activity/Legal Involvement Pertinent to Current Situation/Hospitalization:  No - Comment as needed Significant Relationships:  Adult Children, Friend Lives with:  Self Do you feel safe going back to the place where you live?  Yes Need for family participation in patient care:  Yes (Comment)  Care giving concerns:  PT recommending SNF once medically stable for discharge.   Social Worker assessment / plan:  Patient only oriented to self. CSW called patient's daughter, introduced role, and explained that PT recommendations would be discussed. Patient's daughter agreeable to SNF placement. She stated the patient would prefer [facility]. Sent referral and admissions coordinator is aware. They do not have any female beds right now. Instructed daughter on how to access Medicare.gov Nursing Home Compare so she can review scores for other local facilities in case [facility] cannot take her. Patient's daughter interested in ALF placement after SNF. She stated she has found burned food in the home and assumes she is not eating. They spoke with her PCP in November about ALF and he agreed that they may need to start looking into that. Patient has savings and owns her home. Daughter said her brother will look into patient's financials to determine what assets  she has and at what point they can apply for Medicaid. No further concerns. CSW encouraged patient's daughter to contact CSW as needed. CSW will continue to follow patient and her daughter for support and facilitate discharge to SNF once medically stable.  Employment status:  Retired Nurse, adult PT Recommendations:  [place] / Referral to community resources:  [place]  Patient/Family's Response to care:  Patient only oriented to self. Patient's daughter agreeable to SNF placement. Patient's children supportive and involved in patient's care. Patient's daughter appreciated social work intervention.  Patient/Family's Understanding of and Emotional Response to Diagnosis, Current Treatment, and Prognosis:  Patient only oriented to self. Patient's daughter has a good understanding of the reason for admission and her need for rehab and potential ALF placement afterwards. Patient's daughter appears happy with hospital care.  Emotional Assessment Appearance:  Appears stated age Attitude/Demeanor/Rapport:  Unable to Assess Affect (typically observed):  Unable to Assess Orientation:  Oriented to Self Alcohol / Substance use:  Never Used Psych involvement (Current and /or in the community):  No (Comment)  Discharge Needs  Concerns to be addressed:  Care Coordination Readmission within the last 30 days:  No Current discharge risk:  Cognitively Impaired, Dependent with Mobility, Lives alone Barriers to Discharge:  Continued Medical Work up, [place], LCSW 12/14/2018, 1:01 PM

## 2018-12-14 NOTE — Clinical Social Work Placement (Signed)
   CLINICAL SOCIAL WORK PLACEMENT  NOTE  Date:  12/14/2018  Patient Details  Name: Amanda [name] MRN: [id number] Date of Birth: May 30, 1946  Clinical Social Work is seeking post-discharge placement for this patient at the [place] level of care (*CSW will initial, date and re-position this form in  chart as items are completed):      Patient/family provided with [facility] Work Department's list of facilities offering this level of care within the geographic area requested by the patient (or if unable, by the patient's family).      Patient/family informed of their freedom to choose among providers that offer the needed level of care, that participate in Medicare, Medicaid or managed care program needed by the patient, have an available bed and are willing to accept the patient.      Patient/family informed of [facility]'s ownership interest in [facility] and [facility], as well as of the fact that they are under no obligation to receive care at these facilities.  PASRR submitted to EDS on 12/14/18     PASRR number received on       Existing PASRR number confirmed on 12/14/18     FL2 transmitted to all facilities in geographic area requested by pt/family on 12/14/18     FL2 transmitted to all facilities within larger geographic area on       Patient informed that his/her managed care company has contracts with or will negotiate with certain facilities, including the following:            Patient/family informed of bed offers received.  Patient chooses bed at       Physician recommends and patient chooses bed at      Patient to be transferred to   on  .  Patient to be transferred to facility by       Patient family notified on   of transfer.  Name of family member notified:        PHYSICIAN Please sign FL2     Additional Comment:    _______________________________________________ Candie Chroman, LCSW 12/14/2018, 1:06  PM

## 2018-12-14 NOTE — Evaluation (Signed)
Occupational Therapy Evaluation Patient Details Name: Amanda [name] MRN: [id number] DOB: Jan 20, 1946 Today's Date: 12/14/2018    History of Present Illness 73 y.o. female admitted on 12/09/18 for acute liver failure, acute kidney injury, metabolic acidosis, rhabdomyolysis, acute hepatitis/transaminitis, lactic acidosis, A-fib with RVR, severe coagulopathy, and hypokalemia.  Pt with significant PMH of vertigo, MVP, HTN, DM2, anxiety, anemia, A-fib, bil TKA, throat surgery, R leg fracture surgery.     Clinical Impression   PT admitted with see above. Pt currently with functional limitiations due to the deficits listed below (see OT problem list). Pt currently total (A) for adls and very lethargic throughout session. Pt aroused and progressed to EOB this session mod - minguard (A). Pt oriented to name only. Pt expressed fear due to being unaware of location and situation. Pt with noted bil UE edema and could benefit from elevated BIL UE. Pt could benefit from repositioning every 2 hours from RN staff at this time. Pt may need to progress to air mattress overlay if continues to remain lethargic.  Pt will benefit from skilled OT to increase their independence and safety with adls and balance to allow discharge SNF.     Follow Up Recommendations  SNF    Equipment Recommendations  Wheelchair cushion (measurements OT);Wheelchair (measurements OT);Hospital bed;3 in 1 bedside commode    Recommendations for Other Services       Precautions / Restrictions Precautions Precautions: Fall Precaution Comments: signs of aspiration this session Restrictions Weight Bearing Restrictions: No      Mobility Bed Mobility Overal bed mobility: Needs Assistance Bed Mobility: Rolling Rolling: Mod assist         General bed mobility comments: Left side exit and (A) to elevate trunk from surface. pt pushing with R UE but unable to sustain task.   Transfers                 General transfer comment:  due to arousal remained supine with bed in chair position. Rn notified    Balance Overall balance assessment: Mild deficits observed, not formally tested                                         ADL either performed or assessed with clinical judgement   ADL Overall ADL's : Needs assistance/impaired Eating/Feeding: Maximal assistance;Sitting Eating/Feeding Details (indicate cue type and reason): pt sitting eob min guard and requires hand over hand to bring cup to mouth with straw. pt needed cues to initiate and then showing signs of aspiration with cough, wet voice tone and unable to speak initially Grooming: Total assistance   Upper Body Bathing: Total assistance   Lower Body Bathing: Total assistance   Upper Body Dressing : Total assistance   Lower Body Dressing: Total assistance     Toilet Transfer Details (indicate cue type and reason): foley in place with wet pad under patient but foley appears in place. Rn notified.            General ADL Comments: pt supine to EOB this session after multiple attempts at arousal. pt initially no response to tactile input and OT able to lift arm and move it without response from patient with name call . pt with HOB reduced to fully supine and started to show arousal with name call. pt exiting bed on L side . pt mod to min guard (A) at EOB with increase  time. BP monitored closely and stable. pt attempting drinking at eob with signs of aspiration. pt with focused attention limiting progression of session.      Vision   Additional Comments: due to arousal and attention diffcult to assess     Perception     Praxis      Pertinent Vitals/Pain Pain Assessment: No/denies pain     Hand Dominance Right   Extremity/Trunk Assessment Upper Extremity Assessment Upper Extremity Assessment: RUE deficits/detail;Generalized weakness;LUE deficits/detail RUE Deficits / Details: holding hand in a flexed wrist position but with commands  able to extend wrist. able to hold supine shoulder flexion 80 degrees. pt with decr accuracy with finger to nose LUE Deficits / Details: wrist in neutral and able to complete finger to nose with more accuracy than right   Lower Extremity Assessment Lower Extremity Assessment: Generalized weakness;Defer to PT evaluation   Cervical / Trunk Assessment Cervical / Trunk Assessment: Kyphotic Cervical / Trunk Exceptions: head in flexed posture   Communication Communication Communication: Other (comment)(soft voice quality)   Cognition Arousal/Alertness: Lethargic Behavior During Therapy: Flat affect Overall Cognitive Status: Impaired/Different from baseline Area of Impairment: Memory;Attention;Orientation;Following commands;Awareness                 Orientation Level: Disoriented to;Person;Place;Time;Situation Current Attention Level: Focused Memory: Decreased recall of precautions;Decreased short-term memory Following Commands: Follows one step commands inconsistently;Follows one step commands with increased time Safety/Judgement: Decreased awareness of safety;Decreased awareness of deficits Awareness: Intellectual Problem Solving: Slow processing General Comments: pt verbalized name in soft voice. pt was unable to recall birthdate with increased time and cues. pt unaware of location. pt expressed being scared due to confusion. pt expressed feeling confused and unaware.    General Comments  increase risk for skin break down so log rolling every 2 hours advised or change to air mattress overlay if continues to remain lethargic. pt without sacral pad so RN notified. new linen and gown applied this session with peri hygiene and skin dried well. L UE edema noted with tight arm band and RN notified.     Exercises     Shoulder Instructions      Home Living Family/patient expects to be discharged to:: Private residence Living Arrangements: Alone   Type of Home: House Home Access:  Stairs to enter Technical brewer of Steps: 1 Entrance Stairs-Rails: None Home Layout: Two level Alternate Level Stairs-Number of Steps: flight, has stair lift             Home Equipment: Walker - 2 wheels   Additional Comments: all information copied from PT eval and previous admission      Prior Functioning/Environment Level of Independence: Independent with assistive device(s)        Comments: uses RW prn, drives, has lifeline        OT Problem List: Decreased strength;Decreased activity tolerance;Impaired balance (sitting and/or standing);Decreased cognition;Decreased coordination;Decreased safety awareness;Decreased knowledge of precautions;Decreased knowledge of use of DME or AE;Cardiopulmonary status limiting activity;Obesity;Increased edema;Impaired UE functional use      OT Treatment/Interventions: Self-care/ADL training;Therapeutic exercise;Neuromuscular education;Energy conservation;DME and/or AE instruction;Manual therapy;Modalities;Splinting;Therapeutic activities;Cognitive remediation/compensation;Patient/family education;Balance training    OT Goals(Current goals can be found in the care plan section) Acute Rehab OT Goals Patient Stated Goal: unable to state, confused OT Goal Formulation: Patient unable to participate in goal setting Time For Goal Achievement: 12/28/18 Potential to Achieve Goals: Good  OT Frequency: Min 2X/week   Barriers to D/C: Decreased caregiver support  Co-evaluation              AM-PAC OT "6 Clicks" Daily Activity     Outcome Measure Help from another person eating meals?: A Lot Help from another person taking care of personal grooming?: Total Help from another person toileting, which includes using toliet, bedpan, or urinal?: Total Help from another person bathing (including washing, rinsing, drying)?: Total Help from another person to put on and taking off regular upper body clothing?: A Lot Help from  another person to put on and taking off regular lower body clothing?: Total 6 Click Score: 8   End of Session Equipment Utilized During Treatment: Oxygen(1 L) Nurse Communication: Mobility status;Need for lift equipment;Precautions  Activity Tolerance: Patient limited by lethargy Patient left: in bed;with call bell/phone within reach;with bed alarm set;with SCD's reapplied  OT Visit Diagnosis: Unsteadiness on feet (R26.81);Muscle weakness (generalized) (M62.81)                Time: 1019-1040 OT Time Calculation (min): 21 min Charges:  OT General Charges $OT Visit: 1 Visit OT Evaluation $OT Eval High Complexity: 1 High   Jeri Modena, OTR/L  Acute Rehabilitation Services Pager: [phone] Office: [phone] .   Jeri Modena 12/14/2018, 12:00 PM

## 2018-12-14 NOTE — NC FL2 (Signed)
[place] LEVEL OF CARE SCREENING TOOL     IDENTIFICATION  Patient Name: Amanda [name] Birthdate: Jan 31, 1946 Sex: female Admission Date (Current Location): 12/09/2018  [facility] and [place] Number:  Herbalist and Address:  [facility], [place] [address] [place], [place] [address]      Provider Number: [id number]  Attending Physician Name and Address:  Domenic Polite, MD  Relative Name and Phone Number:       Current Level of Care: Hospital Recommended Level of Care: [place] Prior Approval Number:    Date Approved/Denied:   PASRR Number: [id number]  Discharge Plan: SNF    Current Diagnoses: Patient Active Problem List   Diagnosis Date Noted  . Acute liver failure 12/11/2018  . Acute liver failure without hepatic coma   . Acute kidney injury (AKI) with acute tubular necrosis (ATN) (HCC)   . Hyperkalemia   . Metabolic acidosis   . Atrial fibrillation with RVR ([place]) 09/27/2018  . Weakness generalized 09/27/2018  . Poor compliance with CPAP treatment 01/27/2018  . Trochanteric bursitis of right hip 12/01/2017  . 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 Acute bronchitis due to Streptococcus 11/22/2015  . OSA on CPAP 11/22/2015  . Depression (emotion) 11/22/2015  . Abnormal CT scan   . Hematoma of left psoas region to anticoagulant therapy 10/11/2014  . Coagulopathy ([place]) 10/11/2014  . Psoas hematoma, left, secondary to anticoagulant therapy 10/11/2014  . Acute hyponatremia 10/11/2014  . S/P total knee arthroplasty 09/24/2014  . Hypertension   . GERD (gastroesophageal reflux disease)   . Diabetes type 2, controlled ([place])   . Depression   . Hyperlipidemia   . 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 Cough 08/31/2013    Orientation RESPIRATION BLADDER Height & Weight     Self  O2(Nasal Canula 2 L.) Continent, Indwelling catheter Weight: 214 lb 11.7 oz (97.4 kg) Height:  5' 5.5" (166.4 cm)  BEHAVIORAL SYMPTOMS/MOOD  NEUROLOGICAL BOWEL NUTRITION STATUS  (None) (None) Incontinent Diet(Heart healthy/carb modified.)  AMBULATORY STATUS COMMUNICATION OF NEEDS Skin   Extensive Assist Verbally Bruising(Excoriated.)                       Personal Care Assistance Level of Assistance  Bathing, Feeding, Dressing Bathing Assistance: Maximum assistance Feeding assistance: Maximum assistance Dressing Assistance: Maximum assistance     Functional Limitations Info  Sight, Hearing, Speech Sight Info: Adequate Hearing Info: Adequate Speech Info: Adequate    SPECIAL CARE FACTORS FREQUENCY  PT (By licensed PT), OT (By licensed OT)     PT Frequency: 5 x week OT Frequency: 5 x week            Contractures Contractures Info: Not present    Additional Factors Info  Code Status, Allergies Code Status Info: Full code Allergies Info: Aciphex (Rabeprazole Sodium), Coumadin (Warfarin Sodium), Erythromycin, Lactose Intolerance (GI), Macrodantin, Nsaids, Atorvastatin, Sulfa Antibiotics, Tape.           Current Medications (12/14/2018):  This is the current hospital active medication list Current Facility-Administered Medications  Medication Dose Route Frequency Provider Last Rate Last Dose  . bisacodyl (DULCOLAX) suppository 10 mg  10 mg Rectal Daily PRN Lorella Nimrod, MD      . diltiazem (CARDIZEM) tablet 30 mg  30 mg Oral Q8H Lorella Nimrod, MD   30 mg at 12/14/18 0504  . feeding supplement (BOOST / RESOURCE BREEZE) liquid 1 Container  1 Container Oral TID BM Lorella Nimrod,  MD   1 Container at 12/14/18 0835  . furosemide (LASIX) injection 40 mg  40 mg Intravenous BID Pixie Casino, MD   40 mg at 12/14/18 1137  . insulin aspart (novoLOG) injection 0-15 Units  0-15 Units Subcutaneous Q4H Lorella Nimrod, MD   3 Units at 12/14/18 1137  . insulin glargine (LANTUS) injection 10 Units  10 Units Subcutaneous QHS Lorella Nimrod, MD   10 Units at 12/13/18 2324  . lactulose (CHRONULAC) 10 GM/15ML solution 30 g  30  g Oral TID Lorella Nimrod, MD   30 g at 12/14/18 0825  . levothyroxine (SYNTHROID, LEVOTHROID) tablet 25 mcg  25 mcg Oral QAC breakfast Lorella Nimrod, MD   25 mcg at 12/14/18 0504  . MEDLINE mouth rinse  15 mL Mouth Rinse BID Lorella Nimrod, MD   15 mL at 12/14/18 0837  . metoprolol tartrate (LOPRESSOR) tablet 50 mg  50 mg Oral BID Lorella Nimrod, MD   50 mg at 12/14/18 0825  . ondansetron (ZOFRAN) injection 4 mg  4 mg Intravenous Q6H PRN Lorella Nimrod, MD      . polyethylene glycol (MIRALAX / GLYCOLAX) packet 17 g  17 g Oral Daily PRN Lorella Nimrod, MD   17 g at 12/12/18 2346  . senna-docusate (Senokot-S) tablet 1 tablet  1 tablet Oral BID Lorella Nimrod, MD   1 tablet at 12/14/18 0825  . sodium phosphate (FLEET) 7-19 GM/118ML enema 1 enema  1 enema Rectal Daily PRN Lorella Nimrod, MD      . traMADol Veatrice Bourbon) tablet 50 mg  50 mg Oral Q12H PRN Lorella Nimrod, MD   50 mg at 12/14/18 0241     Discharge Medications: Please see discharge summary for a list of discharge medications.  Relevant Imaging Results:  Relevant Lab Results:   Additional Information SS#: [id number]  Candie Chroman, LCSW

## 2018-12-14 NOTE — Progress Notes (Signed)
Initial Nutrition Assessment  DOCUMENTATION CODES:   Obesity unspecified  INTERVENTION:   -Continue Boost Breeze po TID, each supplement provides 250 kcal and 9 grams of protein -MVI with minerals daily -30 ml Prostat TID, each supplement provides 100 kcals and 15 grams protein -If pt remains too lethargic to take adequate PO's, consider initiation of nutrition support. Recommend:  Initiate Osmolite 1.5 @ 64ml/hr via and increase by 10 ml every 8 hours to goal rate of 45 ml/hr.   30 ml Prostat BID.    Tube feeding regimen provides 1829 kcal (100% of needs), 98 grams of protein, and 823 ml of H2O.   NUTRITION DIAGNOSIS:   Inadequate oral intake related to lethargy/confusion, decreased appetite as evidenced by meal completion < 25%.  GOAL:   Patient will meet greater than or equal to 90% of their needs  MONITOR:   PO intake, Supplement acceptance, Labs, Weight trends, Skin, I & O's  REASON FOR ASSESSMENT:   Consult Assessment of nutrition requirement/status  ASSESSMENT:   73 year old female presented 1/23 with nausea vomiting and weakness found to be in fulminant hepatic failure.  Pt admitted with acute fulminate liver failure.   Reviewed I/O's: +715 ml x 24 hours and +9.5 L since admission  Per MD notes, transfer to [facility] has been cancelled due to improved liver labs.   Pt very lethargic at time of visit. She would arouse briefly when name was called, but fell right back asleep. Despite multiple attempts to waken, pt was not alert enough to participate in history.   Noted poor oral intake during admission. Observed lunch tray at bedside, which was untouched (and contained only an ice pop, juice, and ice cream). Pt had consumed about 25% of Boost Breeze supplement.  If pt remains too lethargic to consume adequate PO's, may need to consider enteral nutrition support.   Labs reviewed: CBGS: 138-277 (inpatient orders for glycemic control are 0-15 units insulin aspart  every 4hours and 10 units insulin glargine q HS).   NUTRITION - FOCUSED PHYSICAL EXAM:    Most Recent Value  Orbital Region  Mild depletion  Upper Arm Region  No depletion  Thoracic and Lumbar Region  No depletion  Buccal Region  No depletion  Temple Region  No depletion  Clavicle Bone Region  No depletion  Clavicle and Acromion Bone Region  No depletion  Scapular Bone Region  No depletion  Dorsal Hand  No depletion  Patellar Region  No depletion  Anterior Thigh Region  No depletion  Posterior Calf Region  No depletion  Edema (RD Assessment)  Mild  Hair  Reviewed  Eyes  Reviewed  Mouth  Reviewed  Skin  Reviewed  Nails  Reviewed       Diet Order:   Diet Order            Diet heart healthy/carb modified Room service appropriate? Yes; Fluid consistency: Thin  Diet effective now              EDUCATION NEEDS:   Not appropriate for education at this time  Skin:  Skin Assessment: Reviewed RN Assessment  Last BM:  12/14/18  Height:   Ht Readings from Last 1 Encounters:  12/13/18 5' 5.5" (1.664 m)    Weight:   Wt Readings from Last 1 Encounters:  12/15/18 90 kg    Ideal Body Weight:  58.2 kg  BMI:  Body mass index is 32.52 kg/m.  Estimated Nutritional Needs:   Kcal:  1800-2000  Protein:  90-105 grams  Fluid:  1.8-2.0 L    Wanetta Funderburke A. Jimmye Norman, RD, LDN, CDE Pager: [phone] After hours Pager: [phone]

## 2018-12-14 NOTE — Progress Notes (Signed)
Progress Note  Patient Name: Amanda [name] Date of Encounter: 12/14/2018  Primary Cardiologist: Ena Dawley, MD    Subjective   Somnolent today - arouses to stimuli and then goes back to sleep. Appears in sinus rhythm today. Liver enzymes continue to improve. Noted to be hyponatremic today - she is close to 10L volume positive.  Inpatient Medications    Scheduled Meds: . clonazePAM  0.5 mg Oral BID  . diltiazem  30 mg Oral Q8H  . feeding supplement  1 Container Oral TID BM  . insulin aspart  0-15 Units Subcutaneous Q4H  . insulin glargine  10 Units Subcutaneous QHS  . lactulose  30 g Oral TID  . levothyroxine  25 mcg Oral QAC breakfast  . mouth rinse  15 mL Mouth Rinse BID  . metoprolol tartrate  50 mg Oral BID  . senna-docusate  1 tablet Oral BID   Continuous Infusions:  PRN Meds: bisacodyl, ondansetron, polyethylene glycol, sodium phosphate, traMADol   Vital Signs    Vitals:   12/13/18 2015 12/13/18 2053 12/14/18 0406 12/14/18 0824  BP:  128/60 129/78 111/78  Pulse:  70 65 73  Resp:  20 18   Temp: (!) 97.4 F (36.3 C) (!) 97.2 F (36.2 C) 97.8 F (36.6 C)   TempSrc: Oral Oral    SpO2:  100% 94% 98%  Weight:  97.6 kg 97.4 kg   Height:  5' 5.5" (1.664 m)      Intake/Output Summary (Last 24 hours) at 12/14/2018 1023 Last data filed at 12/14/2018 0500 Gross per 24 hour  Intake 1273.87 ml  Output 645 ml  Net 628.87 ml   Last 3 Weights 12/14/2018 12/13/2018 12/13/2018  Weight (lbs) 214 lb 11.7 oz 215 lb 2.7 oz 192 lb 3.9 oz  Weight (kg) 97.4 kg 97.6 kg 87.2 kg      Telemetry    Sinus rhythm in 70's  - Personally Reviewed  ECG    N/A  Physical Exam   General appearance: icteric, mild distress and somnolent Neck: JVD - 3 cm above sternal notch, no carotid bruit and thyroid not enlarged, symmetric, no tenderness/mass/nodules Lungs: diminished breath sounds bilaterally Heart: regular rate and rhythm and systolic murmur: early systolic 2/6, blowing  at apex Abdomen: protuberant, firm, mild TTP in RLQ Extremities: edema 2+ edema Pulses: 2+ and symmetric Skin: pale, ?mild jaundice Neurologic: Mental status: somnolent, arouses to stimuli PSych: Cannot assess  Labs    Chemistry Recent Labs  Lab 12/12/18 0354 12/12/18 1243  12/12/18 2045 12/13/18 0503 12/14/18 0416  NA 137 133*  --   --  132* 129*  K <2.0* 2.1*   < > 3.3* 3.3* 4.0  CL 98 98  --   --  101 95*  CO2 26 23  --   --  24 23  GLUCOSE 157* 330*  --   --  155* 141*  BUN 23 22  --   --  22 27*  CREATININE 0.83 0.82  --   --  0.90 0.95  CALCIUM 8.4* 8.3*  --   --  8.6* 9.0  PROT 4.8*  --   --   --  4.9* 5.3*  ALBUMIN 2.9*  --   --   --  3.0* 3.0*  AST 564*  --   --   --  187* 88*  ALT 1,032*  --   --   --  749* 528*  ALKPHOS 72  --   --   --  79 69  BILITOT 2.1*  --   --   --  2.3* 1.8*  GFRNONAA >60 >60  --   --  >60 59*  GFRAA >60 >60  --   --  >60 >60  ANIONGAP 13 12  --   --  7 11   < > = values in this interval not displayed.     Hematology Recent Labs  Lab 12/12/18 0354 12/13/18 0503 12/14/18 0416  WBC 14.3* 13.9* 15.5*  RBC 3.91 4.09 4.14  HGB 10.9* 11.5* 11.5*  HCT 34.1* 36.2 36.5  MCV 87.2 88.5 88.2  MCH 27.9 28.1 27.8  MCHC 32.0 31.8 31.5  RDW 14.8 15.6* 15.9*  PLT 196 195 183    Cardiac EnzymesNo results for input(s): TROPONINI in the last 168 hours. No results for input(s): TROPIPOC in the last 168 hours.   BNPNo results for input(s): BNP, PROBNP in the last 168 hours.   DDimer No results for input(s): DDIMER in the last 168 hours.   Radiology    No results found.  Cardiac Studies   TEE EF 50-55% severe MR   Patient Profile     ORVILLE WIDMANN is a 73 y.o. female with a hx of hypertension, hyperlipidemia, hypothyroidism, atrial fibrillation s/p DCCV 09/2018 on Eliquis, OSA on CPAP and frequent falls who is being seen today for the evaluation of atrial fibrillation with RVR at the request of Dr. Reesa Chew.  Assessment & Plan      Afib:  Rate control adequate no anticoagulation given liver failure continue oral short-acting cardizem no history of CAD and EF low normal by TTE, however, severe MR noted.  MR:  Severe by TEE not a candidate for intervention may be clip candidate if she recovers from  Hepatic failure - she is +8L positive (+10 KG since admit) - Would consider d/c IVF's today and recommend starting diuresis per PCCM. Although LVEF low normal, there is reduced cardiac output in the setting of afib and severe MR. Would repeat echo in near future to see if MR is improved.  Hepatic failure acetylcystine and LR hydration - not sure she needs transfer to [facility]. Etiology unclear - ?overdose-toxin, vs CBD stone? Complains of abdominal pain and constipation today - would benefit from GI evaluation. Somnolent today- ?med related, consider ammonia level  Volume overload: Recommended d/c fluids yesterday and diuresis. Will initiate diuresis today - progressive hyponatremia, likely hypervolemic hyponatremia.  For questions or updates, please contact [place] Please consult [email or web address] for contact info under   Pixie Casino, MD, FACC, [place] Director of the [facility]  [facility] Diplomate of the American Board of Clinical Lipidology Attending Cardiologist  Direct Dial: [phone]  Fax: [phone]  Website:  [email or web address]  Pixie Casino, MD  12/14/2018, 10:23 AM

## 2018-12-14 NOTE — Progress Notes (Signed)
PROGRESS NOTE    Amanda [name]  EQA:[id number] DOB: 23-Mar-1946 DOA: 12/09/2018 PCP: Aretta Nip, MD  Brief Narrative: This 73 year old female with history of atrial fibrillation on Eliquis, mitral regurgitation, sleep apnea, hypertension, diabetes mellitus, her atrial fibrillation was recently treated with metoprolol, Eliquis and flecainide. -Presented to the emergency room with weakness dizziness nausea and vomiting, in the emergency room she was found to be hypotensive with blood pressure in the 70s, also found to have acute kidney injury, with a creatinine of 3, abnormal LFTs with AST of 3257, ALT 1690, total bili of 3.6 and CK of 1150. -CT abdomen pelvis was unremarkable -She was admitted to the ICU, hydrated, work-up for causes of liver failure was unremarkable, also being followed by cardiology for atrial fibrillation with rapid ventricular response -Transferred from PCCM to [facility] service today 1/28  Assessment & Plan:   Hypotension -Resolved, EMS reported initial blood pressure of 70/30 when they initiated fluid bolus, I suspect she may have been hypotensive for a few days prior to admission as she reported symptoms of weakness dizziness for 3 to 4 days prior to admission -Initial ED notes from 1/23 indicate hypovolemic shock,, inability to obtain manual blood pressure before boluses started. -Resolved, has been aggressively hydrated and is 10 L positive -Could be due to hypovolemia, medications, severe MR could also be contributing -Discontinue IV fluids  Acute liver failure -I suspect this is secondary to shock liver, patient was hypotensive with SBP in the 70s per EMS when fluid bolus was initiated -Hepatitis serology negative, Tylenol level less than 10, CT and ultrasound without acute gallbladder or biliary pathology -Work-up for autoimmune hepatitis is also negative -AST ALT and total bilirubin trending now down nicely from 3257/1690 and total bili of 3.6 on admission to  528/88 and 1.8 now -Will trend LFTs -PCCM had initiated transfer to tertiary center for liver failure which was canceled yesterday  Lethargy -Could be from twice daily Klonopin, decrease hepatic metabolism/clearance, discontinue -will follow-up ammonia levels, she is on lactulose which will be continued -Monitor  Volume overload -This is iatrogenic due to aggressive fluid resuscitation -She is 9 to 10 L positive started on IV Lasix this morning, agree with diuresis -Monitor kidney function  Acute kidney injury -Due to volume depletion, shock -Creatinine was 1.9 on admission, resolved  Atrial fibrillation with rapid ventricular response -This morning appears to be in sinus rhythm, continue metoprolol, anticoagulation on hold due to liver failure, could be restarted in few days if LFTs continue to trend down  Severe mitral regurgitation -Per cardiology  Diabetes mellitus -Admit on hold, continue sliding scale insulin  DVT prophylaxis: Coagulopathic from liver failure Code Status: Full code Family Communication: No family at bedside Disposition Plan: Home pending clinical improvement, PT eval  Consultants:   PCCM  Cardiology   Procedures:   Antimicrobials:    Subjective: -Feels better today, still weak and tired, sleepy  Objective: Vitals:   12/13/18 2015 12/13/18 2053 12/14/18 0406 12/14/18 0824  BP:  128/60 129/78 111/78  Pulse:  70 65 73  Resp:  20 18   Temp: (!) 97.4 F (36.3 C) (!) 97.2 F (36.2 C) 97.8 F (36.6 C)   TempSrc: Oral Oral    SpO2:  100% 94% 98%  Weight:  97.6 kg 97.4 kg   Height:  5' 5.5" (1.664 m)      Intake/Output Summary (Last 24 hours) at 12/14/2018 1116 Last data filed at 12/14/2018 0500 Gross per 24 hour  Intake  1273.87 ml  Output 615 ml  Net 658.87 ml   Filed Weights   12/13/18 0500 12/13/18 2053 12/14/18 0406  Weight: 87.2 kg 97.6 kg 97.4 kg    Examination:  General exam: Chronically ill-appearing female, sitting up  in bed, no distress, somnolent but arousable, oriented x2 Respiratory system: Decreased breath sounds at both bases  cardiovascular system: S1 & S2 heard, RRR. No JVD, murmurs, rubs, gallops Gastrointestinal system: Soft, distended, bowel sounds present Central nervous system: Somnolent, arousable, oriented x2, moves all extremities no localizing signs Extremities: 1+ edema Skin: No rashes, lesions or ulcers Psychiatry: Unable to assess    Data Reviewed:   CBC: Recent Labs  Lab 12/09/18 1613  12/10/18 0556 12/11/18 0344 12/12/18 0354 12/13/18 0503 12/14/18 0416  WBC 15.2*  --  18.1* 15.5* 14.3* 13.9* 15.5*  NEUTROABS 12.2*  --   --   --   --   --   --   HGB 13.0   < > 10.0* 11.0* 10.9* 11.5* 11.5*  HCT 43.8   < > 32.5* 34.4* 34.1* 36.2 36.5  MCV 93.4  --  90.5 88.2 87.2 88.5 88.2  PLT 294  --  197 217 196 195 183   < > = values in this interval not displayed.   Basic Metabolic Panel: Recent Labs  Lab 12/09/18 1613  12/10/18 0556 12/11/18 0344 12/12/18 0354 12/12/18 0800 12/12/18 1243 12/12/18 1402 12/12/18 2045 12/13/18 0503 12/14/18 0416  NA 135   < > 137 136 137  --  133*  --   --  132* 129*  K 5.7*   < > 4.0 2.8* <2.0*  --  2.1* 2.7* 3.3* 3.3* 4.0  CL 100   < > 102 96* 98  --  98  --   --  101 95*  CO2 11*   < > 14* 23 26  --  23  --   --  24 23  GLUCOSE 81   < > 224* 204* 157*  --  330*  --   --  155* 141*  BUN 29*   < > 32* 28* 23  --  22  --   --  22 27*  CREATININE 1.91*   < > 1.63* 1.08* 0.83  --  0.82  --   --  0.90 0.95  CALCIUM 9.6   < > 8.4* 8.5* 8.4*  --  8.3*  --   --  8.6* 9.0  MG 2.0  --  1.7 1.7  --  1.9  --   --   --  1.8  --    < > = values in this interval not displayed.   GFR: Estimated Creatinine Clearance: 61.5 mL/min (by C-G formula based on SCr of 0.95 mg/dL). Liver Function Tests: Recent Labs  Lab 12/10/18 0556 12/11/18 0344 12/12/18 0354 12/13/18 0503 12/14/18 0416  AST 2,934* 2,219* 564* 187* 88*  ALT 1,603* 1,648* 1,032*  749* 528*  ALKPHOS 75 78 72 79 69  BILITOT 2.9* 2.7* 2.1* 2.3* 1.8*  PROT 5.8* 5.3* 4.8* 4.9* 5.3*  ALBUMIN 3.8 3.3* 2.9* 3.0* 3.0*   Recent Labs  Lab 12/09/18 1613  LIPASE 22   Recent Labs  Lab 12/10/18 0730  AMMONIA 44*   Coagulation Profile: Recent Labs  Lab 12/11/18 0344 12/11/18 1638 12/12/18 0800 12/13/18 0707 12/14/18 0416  INR 6.71* 3.68 2.52 1.75 1.48   Cardiac Enzymes: Recent Labs  Lab 12/09/18 1950 12/10/18 0556 12/11/18 0344 12/12/18 0800 12/13/18 0503  CKTOTAL 1,150* 2,661* 3,197* 777* 290*   BNP (last 3 results) No results for input(s): PROBNP in the last 8760 hours. HbA1C: No results for input(s): HGBA1C in the last 72 hours. CBG: Recent Labs  Lab 12/13/18 2006 12/13/18 2133 12/14/18 0044 12/14/18 0403 12/14/18 0758  GLUCAP 347* 226* 182* 122* 113*   Lipid Profile: No results for input(s): CHOL, HDL, LDLCALC, TRIG, CHOLHDL, LDLDIRECT in the last 72 hours. Thyroid Function Tests: No results for input(s): TSH, T4TOTAL, FREET4, T3FREE, THYROIDAB in the last 72 hours. Anemia Panel: No results for input(s): VITAMINB12, FOLATE, FERRITIN, TIBC, IRON, RETICCTPCT in the last 72 hours. Urine analysis:    Component Value Date/Time   COLORURINE AMBER (A) 12/10/2018 0000   APPEARANCEUR HAZY (A) 12/10/2018 0000   LABSPEC 1.021 12/10/2018 0000   PHURINE 5.0 12/10/2018 0000   GLUCOSEU NEGATIVE 12/10/2018 0000   HGBUR LARGE (A) 12/10/2018 0000   BILIRUBINUR NEGATIVE 12/10/2018 0000   KETONESUR 20 (A) 12/10/2018 0000   PROTEINUR 100 (A) 12/10/2018 0000   UROBILINOGEN 0.2 10/15/2014 2020   NITRITE NEGATIVE 12/10/2018 0000   LEUKOCYTESUR NEGATIVE 12/10/2018 0000   Sepsis Labs: @LABRCNTIP (procalcitonin:4,lacticidven:4)  ) Recent Results (from the past 240 hour(s))  Blood Culture (routine x 2)     Status: None   Collection Time: 12/09/18  5:51 PM  Result Value Ref Range Status   Specimen Description BLOOD RIGHT HAND  Final   Special Requests    Final    BOTTLES DRAWN AEROBIC AND ANAEROBIC Blood Culture results may not be optimal due to an inadequate volume of blood received in culture bottles   Culture   Final    NO GROWTH 5 DAYS Performed at [facility], [place] [address], [place], [place] [address]    Report Status 12/14/2018 FINAL  Final  Blood Culture (routine x 2)     Status: None   Collection Time: 12/09/18  6:06 PM  Result Value Ref Range Status   Specimen Description BLOOD LEFT HAND  Final   Special Requests   Final    BOTTLES DRAWN AEROBIC AND ANAEROBIC Blood Culture results may not be optimal due to an inadequate volume of blood received in culture bottles   Culture   Final    NO GROWTH 5 DAYS Performed at [facility], [place] [address], [place], [place] [address]    Report Status 12/14/2018 FINAL  Final  MRSA PCR Screening     Status: None   Collection Time: 12/09/18 11:00 PM  Result Value Ref Range Status   MRSA by PCR NEGATIVE NEGATIVE Final    Comment:        The GeneXpert MRSA Assay (FDA approved for NASAL specimens only), is one component of a comprehensive MRSA colonization surveillance program. It is not intended to diagnose MRSA infection nor to guide or monitor treatment for MRSA infections. Performed at [facility], [place] [address] [place], [place] [address]   Urine culture     Status: None   Collection Time: 12/10/18 12:01 AM  Result Value Ref Range Status   Specimen Description URINE, RANDOM  Final   Special Requests   Final    NONE Performed at [facility], [place] [address] [place], [place] [address]    Culture NO GROWTH  Final   Report Status 12/11/2018 FINAL  Final         Radiology Studies: No results found.      Scheduled Meds: . diltiazem  30 mg Oral Q8H  . feeding supplement  1 Container Oral TID BM  . furosemide  40 mg Intravenous BID  . insulin aspart  0-15 Units Subcutaneous Q4H  . insulin glargine  10 Units Subcutaneous QHS  . lactulose  30  g Oral TID  . levothyroxine  25 mcg Oral QAC breakfast  . mouth rinse  15 mL Mouth Rinse BID  . metoprolol tartrate  50 mg Oral BID  . senna-docusate  1 tablet Oral BID   Continuous Infusions:   LOS: 5 days    Time spent: 75min    Domenic Polite, MD [facility] Page via [email or web address], password [email or web address] After 7PM please contact night-coverage  12/14/2018, 11:16 AM

## 2018-12-14 NOTE — Progress Notes (Signed)
Physical Therapy Treatment Patient Details Name: Amanda [name] MRN: [id number] DOB: 24-Feb-1946 Today's Date: 12/14/2018    History of Present Illness 73 y.o. female admitted on 12/09/18 for acute liver failure, acute kidney injury, metabolic acidosis, rhabdomyolysis, acute hepatitis/transaminitis, lactic acidosis, A-fib with RVR, severe coagulopathy, and hypokalemia.  Pt with significant PMH of vertigo, MVP, HTN, DM2, anxiety, anemia, A-fib, bil TKA, throat surgery, R leg fracture surgery.      PT Comments    Patient progressing well towards PT goals. More alert this session. Tolerated standing and SPT to [facility] with Min-Mod A for balance/safety. Pt with confusion- oriented to self only with impaired attention and awareness. Ambulation deferred secondary to dizziness worsened with prolonged standing. Appropriate for SNF. Will follow and progress as tolerated.    Follow Up Recommendations  SNF     Equipment Recommendations  None recommended by PT    Recommendations for Other Services       Precautions / Restrictions Precautions Precautions: Fall Precaution Comments:  Restrictions Weight Bearing Restrictions: No    Mobility  Bed Mobility Overal bed mobility: Needs Assistance Bed Mobility: Rolling;Sidelying to Sit Rolling: Min guard Sidelying to sit: HOB elevated;Mod assist       General bed mobility comments: Assist to bring LEs to EOB, scoot bottom and elevate trunk. Able to initiate minimally.  Transfers Overall transfer level: Needs assistance Equipment used: Rolling walker (2 wheeled);None Transfers: Sit to/from [employer] to Stand: Min assist Stand pivot transfers: Min assist       General transfer comment: Assist to power to standing from EOB with bil knee instability. + dizziness with prolonged standing. Stood from [employer], from [facility] x2. SPT bed to [facility] with Min A.   Ambulation/Gait             General Gait Details: Defererd due to  dizziness upon standing- closed eyes and not consistently responding.   Stairs             Wheelchair Mobility    Modified Rankin (Stroke Patients Only)       Balance Overall balance assessment: Needs assistance Sitting-balance support: Feet supported;Single extremity supported Sitting balance-Leahy Scale: Fair Sitting balance - Comments: Leaning on left forearm but able to come to midline with cues.  Postural control: Left lateral lean Standing balance support: During functional activity;Bilateral upper extremity supported Standing balance-Leahy Scale: Poor Standing balance comment: Reliant on BUEs for support in standing. External support needed for dynamic tasks. total A for pericare, able to stand for ~45 sec                            Cognition Arousal/Alertness: Awake/alert Behavior During Therapy: Flat affect Overall Cognitive Status: Impaired/Different from baseline Area of Impairment: Orientation;Memory;Awareness;Attention                 Orientation Level: Disoriented to;Place;Time;Situation Current Attention Level: Sustained Memory: Decreased short-term memory Following Commands: Follows one step commands with increased time;Follows multi-step commands inconsistently Safety/Judgement: Decreased awareness of safety;Decreased awareness of deficits Awareness: Intellectual Problem Solving: Slow processing General Comments: Reports she is in "[facility]" "April" "2016". Still confused and reports being sort of aware of this.       Exercises      General Comments General comments (skin integrity, edema, etc.): increase risk for skin break down so log rolling every 2 hours advised or change to air mattress overlay if continues to remain lethargic. pt  without sacral pad so RN notified. new linen and gown applied this session with peri hygiene and skin dried well. L UE edema noted with tight arm band and RN notified.       Pertinent  Vitals/Pain Pain Assessment: No/denies pain    Home Living Family/patient expects to be discharged to:: Private residence Living Arrangements: Alone   Type of Home: House Home Access: Stairs to enter Entrance Stairs-Rails: None Home Layout: Two level Home Equipment: Environmental consultant - 2 wheels Additional Comments: all information copied from PT eval and previous admission    Prior Function Level of Independence: Independent with assistive device(s)      Comments: uses RW prn, drives, has lifeline   PT Goals (current goals can now be found in the care plan section) Acute Rehab PT Goals Patient Stated Goal: unable to state, confused Progress towards PT goals: Progressing toward goals    Frequency    Min 2X/week      PT Plan Current plan remains appropriate    Co-evaluation              AM-PAC PT "6 Clicks" Mobility   Outcome Measure  Help needed turning from your back to your side while in a flat bed without using bedrails?: A Little Help needed moving from lying on your back to sitting on the side of a flat bed without using bedrails?: A Lot Help needed moving to and from a bed to a chair (including a wheelchair)?: A Lot Help needed standing up from a chair using your arms (e.g., wheelchair or bedside chair)?: A Lot Help needed to walk in hospital room?: A Lot Help needed climbing 3-5 steps with a railing? : Total 6 Click Score: 12    End of Session Equipment Utilized During Treatment: Gait belt;Oxygen Activity Tolerance: Patient tolerated treatment well Patient left: in chair;with call bell/phone within reach;with chair alarm set Nurse Communication: Mobility status PT Visit Diagnosis: Muscle weakness (generalized) (M62.81);Difficulty in walking, not elsewhere classified (R26.2)     Time: 3825-0539 PT Time Calculation (min) (ACUTE ONLY): 27 min  Charges:  $Therapeutic Activity: 23-37 mins                     Wray Kearns, [place], DPT Acute Rehabilitation  Services Pager [phone] Office [phone]       Amanda [name] 12/14/2018, 2:59 PM

## 2018-12-14 NOTE — Plan of Care (Signed)
  Problem: Health Behavior/Discharge Planning: Goal: Ability to manage health-related needs will improve Outcome: Progressing   Problem: Activity: Goal: Risk for activity intolerance will decrease Outcome: Progressing   Problem: Nutrition: Goal: Adequate nutrition will be maintained Outcome: Progressing   

## 2018-12-15 ENCOUNTER — Encounter (HOSPITAL_COMMUNITY): Payer: Self-pay | Admitting: General Practice

## 2018-12-15 LAB — COMPREHENSIVE METABOLIC PANEL
ALT: 354 U/L — ABNORMAL HIGH (ref 0–44)
AST: 55 U/L — ABNORMAL HIGH (ref 15–41)
Albumin: 2.7 g/dL — ABNORMAL LOW (ref 3.5–5.0)
Alkaline Phosphatase: 66 U/L (ref 38–126)
Anion gap: 11 (ref 5–15)
BUN: 22 mg/dL (ref 8–23)
CO2: 23 mmol/L (ref 22–32)
Calcium: 8.8 mg/dL — ABNORMAL LOW (ref 8.9–10.3)
Chloride: 101 mmol/L (ref 98–111)
Creatinine, Ser: 0.8 mg/dL (ref 0.44–1.00)
GFR calc Af Amer: >60 mL/min (ref >60)
Glucose, Bld: 127 mg/dL — ABNORMAL HIGH (ref 70–99)
Potassium: 2.6 mmol/L — CL (ref 3.5–5.1)
Sodium: 135 mmol/L (ref 135–145)
Total Bilirubin: 1.3 mg/dL — ABNORMAL HIGH (ref 0.3–1.2)
Total Protein: 5 g/dL — ABNORMAL LOW (ref 6.5–8.1)

## 2018-12-15 LAB — GLUCOSE, CAPILLARY
Glucose-Capillary: 100 mg/dL — ABNORMAL HIGH (ref 70–99)
Glucose-Capillary: 103 mg/dL — ABNORMAL HIGH (ref 70–99)
Glucose-Capillary: 112 mg/dL — ABNORMAL HIGH (ref 70–99)
Glucose-Capillary: 117 mg/dL — ABNORMAL HIGH (ref 70–99)
Glucose-Capillary: 185 mg/dL — ABNORMAL HIGH (ref 70–99)
Glucose-Capillary: 229 mg/dL — ABNORMAL HIGH (ref 70–99)

## 2018-12-15 LAB — CBC
HCT: 37.8 % (ref 36.0–46.0)
Hemoglobin: 11.8 g/dL — ABNORMAL LOW (ref 12.0–15.0)
MCH: 27.6 pg (ref 26.0–34.0)
MCHC: 31.2 g/dL (ref 30.0–36.0)
MCV: 88.3 fL (ref 80.0–100.0)
Platelets: 152 10*3/uL (ref 150–400)
RBC: 4.28 MIL/uL (ref 3.87–5.11)
RDW: 16.3 % — ABNORMAL HIGH (ref 11.5–15.5)
WBC: 12.6 10*3/uL — ABNORMAL HIGH (ref 4.0–10.5)
nRBC: 0 % (ref 0.0–0.2)

## 2018-12-15 MED ORDER — PRO-STAT SUGAR FREE PO LIQD
30.0000 mL | Freq: Three times a day (TID) | ORAL | Status: DC
  Administered 2018-12-15 – 2018-12-16 (×4): 30 mL via ORAL
  Filled 2018-12-15 (×3): qty 30

## 2018-12-15 MED ORDER — DILTIAZEM HCL ER COATED BEADS 120 MG PO CP24
120.0000 mg | ORAL_CAPSULE | Freq: Every day | ORAL | Status: DC
  Administered 2018-12-15 – 2018-12-21 (×7): 120 mg via ORAL
  Filled 2018-12-15 (×7): qty 1

## 2018-12-15 MED ORDER — ADULT MULTIVITAMIN W/MINERALS CH
1.0000 | ORAL_TABLET | Freq: Every day | ORAL | Status: DC
  Administered 2018-12-15 – 2018-12-21 (×7): 1 via ORAL
  Filled 2018-12-15 (×7): qty 1

## 2018-12-15 MED ORDER — APIXABAN 5 MG PO TABS
5.0000 mg | ORAL_TABLET | Freq: Two times a day (BID) | ORAL | Status: DC
  Administered 2018-12-15 – 2018-12-21 (×13): 5 mg via ORAL
  Filled 2018-12-15 (×13): qty 1

## 2018-12-15 MED ORDER — POTASSIUM CHLORIDE CRYS ER 20 MEQ PO TBCR
40.0000 meq | EXTENDED_RELEASE_TABLET | ORAL | Status: AC
  Administered 2018-12-15 (×3): 40 meq via ORAL
  Filled 2018-12-15 (×3): qty 2

## 2018-12-15 MED ORDER — LACTULOSE 10 GM/15ML PO SOLN
20.0000 g | Freq: Three times a day (TID) | ORAL | Status: DC
  Administered 2018-12-15 – 2018-12-17 (×7): 20 g via ORAL
  Filled 2018-12-15 (×7): qty 30

## 2018-12-15 MED ORDER — DILTIAZEM HCL ER COATED BEADS 120 MG PO TB24
120.0000 mg | ORAL_TABLET | Freq: Every day | ORAL | Status: DC
  Filled 2018-12-15: qty 1

## 2018-12-15 NOTE — Clinical Social Work Note (Addendum)
[facility] does not have any female beds and admissions coordinator is unsure when one will open up. CSW called daughter to notify and emailed her list of bed offers as of right now.  Dayton Scrape, CSW [phone]  1:54 pm Patient fully oriented today. Discussed SNF with her and she asked about a facility closer to [place] where her daughter lives. Called and discussed with daughter. Reviewed scores for several facilities near her zip code. Will start with [facility] in [place]. Patient is not on oxygen at home. She will hopefully be weaned to room air prior to discharge. Patient's daughter should be able to transport her but says her daughter has appointments the next couple of days that cannot be missed.  Dayton Scrape, [place] [phone]  4:05 pm Daughter also requesting referral be sent to [facility] in [place]. Faxed referral to admissions coordinator, Clyde Canterbury.  Dayton Scrape, [place]

## 2018-12-15 NOTE — NC FL2 (Signed)
[place] LEVEL OF CARE SCREENING TOOL     IDENTIFICATION  Patient Name: Amanda [name] Birthdate: [birth year] Sex: female Admission Date (Current Location): 12/09/2018  [facility] Number:  Herbalist and Address:  [facility], [place] [address] [place], [place] [address]      Provider Number: [id number]  Attending Physician Name and Address:  Domenic Polite, MD  Relative Name and Phone Number:       Current Level of Care: Hospital Recommended Level of Care: [place] Prior Approval Number:    Date Approved/Denied:   PASRR Number: [id number]  Discharge Plan: SNF    Current Diagnoses: Patient Active Problem List   Diagnosis Date Noted  . Acute liver failure 12/11/2018  . Acute liver failure without hepatic coma   . Acute kidney injury (AKI) with acute tubular necrosis (ATN) (HCC)   . Hyperkalemia   . Metabolic acidosis   . Atrial fibrillation with RVR ([place]) 09/27/2018  . Weakness generalized 09/27/2018  . Poor compliance with CPAP treatment 01/27/2018  . Trochanteric bursitis of right hip 12/01/2017  . 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 Acute bronchitis due to Streptococcus 11/22/2015  . OSA on CPAP 11/22/2015  . Depression (emotion) 11/22/2015  . Abnormal CT scan   . Hematoma of left psoas region to anticoagulant therapy 10/11/2014  . Coagulopathy ([place]) 10/11/2014  . Psoas hematoma, left, secondary to anticoagulant therapy 10/11/2014  . Acute hyponatremia 10/11/2014  . S/P total knee arthroplasty 09/24/2014  . Hypertension   . GERD (gastroesophageal reflux disease)   . Diabetes type 2, controlled ([place])   . Depression   . Hyperlipidemia   . 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 Cough 08/31/2013    Orientation RESPIRATION BLADDER Height & Weight     Self, Time, Situation, Place  O2, Other (Comment)(Nasal Canula 2 L. CPAP at night at home.) Not on oxygen at home so can hopefully wean prior to  discharge. Continent, Indwelling catheter Weight: 198 lb 6.6 oz (90 kg) Height:  5' 5.5" (166.4 cm)  BEHAVIORAL SYMPTOMS/MOOD NEUROLOGICAL BOWEL NUTRITION STATUS  (None) (None) Incontinent Diet(Heart healthy/carb modified.)  AMBULATORY STATUS COMMUNICATION OF NEEDS Skin   Extensive Assist (Now fully oriented so following for next PT note). Verbally Bruising(Excoriated.)                       Personal Care Assistance Level of Assistance  Bathing, Feeding, Dressing Bathing Assistance: Limited assistance Feeding assistance: Limited assistance Dressing Assistance: Limited assistance     Functional Limitations Info  Sight, Hearing, Speech Sight Info: Adequate Hearing Info: Adequate Speech Info: Adequate    SPECIAL CARE FACTORS FREQUENCY  PT (By licensed PT), OT (By licensed OT)     PT Frequency: 5 x week OT Frequency: 5 x week            Contractures Contractures Info: Not present    Additional Factors Info  Code Status, Allergies Code Status Info: Full code Allergies Info: Aciphex (Rabeprazole Sodium), Coumadin (Warfarin Sodium), Erythromycin, Lactose Intolerance (GI), Macrodantin, Nsaids, Atorvastatin, Sulfa Antibiotics, Tape.           Current Medications (12/15/2018):  This is the current hospital active medication list Current Facility-Administered Medications  Medication Dose Route Frequency Provider Last Rate Last Dose  . apixaban (ELIQUIS) tablet 5 mg  5 mg Oral BID Pixie Casino, MD   5 mg at 12/15/18 1000  . bisacodyl (DULCOLAX) suppository 10 mg  10 mg Rectal Daily PRN Lorella Nimrod, MD      . diltiazem (CARDIZEM CD) 24 hr capsule 120 mg  120 mg Oral Daily Domenic Polite, MD   120 mg at 12/15/18 1000  . feeding supplement (BOOST / RESOURCE BREEZE) liquid 1 Container  1 Container Oral TID BM Lorella Nimrod, MD   1 Container at 12/15/18 1319  . feeding supplement (PRO-STAT SUGAR FREE 64) liquid 30 mL  30 mL Oral TID BM Domenic Polite, MD   30 mL at  12/15/18 1315  . furosemide (LASIX) injection 40 mg  40 mg Intravenous BID Pixie Casino, MD   40 mg at 12/15/18 0842  . insulin aspart (novoLOG) injection 0-15 Units  0-15 Units Subcutaneous Q4H Lorella Nimrod, MD   8 Units at 12/15/18 1315  . insulin glargine (LANTUS) injection 10 Units  10 Units Subcutaneous QHS Lorella Nimrod, MD   10 Units at 12/14/18 2302  . lactulose (CHRONULAC) 10 GM/15ML solution 20 g  20 g Oral TID Domenic Polite, MD   20 g at 12/15/18 1000  . levothyroxine (SYNTHROID, LEVOTHROID) tablet 25 mcg  25 mcg Oral QAC breakfast Lorella Nimrod, MD   25 mcg at 12/15/18 [phone]  . MEDLINE mouth rinse  15 mL Mouth Rinse BID Lorella Nimrod, MD   15 mL at 12/15/18 1001  . metoprolol tartrate (LOPRESSOR) tablet 50 mg  50 mg Oral BID Lorella Nimrod, MD   50 mg at 12/15/18 1001  . multivitamin with minerals tablet 1 tablet  1 tablet Oral Daily Domenic Polite, MD   1 tablet at 12/15/18 1119  . ondansetron (ZOFRAN) injection 4 mg  4 mg Intravenous Q6H PRN Lorella Nimrod, MD      . polyethylene glycol (MIRALAX / GLYCOLAX) packet 17 g  17 g Oral Daily PRN Lorella Nimrod, MD   17 g at 12/12/18 2346  . senna-docusate (Senokot-S) tablet 1 tablet  1 tablet Oral BID Lorella Nimrod, MD   1 tablet at 12/15/18 1000  . sodium phosphate (FLEET) 7-19 GM/118ML enema 1 enema  1 enema Rectal Daily PRN Lorella Nimrod, MD      . traMADol Veatrice Bourbon) tablet 50 mg  50 mg Oral Q12H PRN Lorella Nimrod, MD   50 mg at 12/14/18 1844     Discharge Medications: Please see discharge summary for a list of discharge medications.  Relevant Imaging Results:  Relevant Lab Results:   Additional Information SS#: [id number]  Candie Chroman, LCSW

## 2018-12-15 NOTE — Progress Notes (Signed)
CRITICAL VALUE ALERT  Critical Value:  Potassium 2.6  Date & Time Notied:  12/15/18 0815  Provider Notified: 12/15/18 0825  Orders Received/Actions taken: new orders

## 2018-12-15 NOTE — Progress Notes (Signed)
Occupational Therapy Treatment Patient Details Name: Amanda [name] MRN: [id number] DOB: [birth year] Today's Date: 12/15/2018    History of present illness 73 y.o. female admitted on 12/09/18 for acute liver failure, acute kidney injury, metabolic acidosis, rhabdomyolysis, acute hepatitis/transaminitis, lactic acidosis, A-fib with RVR, severe coagulopathy, and hypokalemia.  Pt with significant PMH of vertigo, MVP, HTN, DM2, anxiety, anemia, A-fib, bil TKA, throat surgery, R leg fracture surgery.     OT comments  Pt making progress with functional goals. Pt participating much better this session. OT will continue to follow acutely  Follow Up Recommendations  SNF    Equipment Recommendations  Wheelchair cushion (measurements OT);Wheelchair (measurements OT);Hospital bed;3 in 1 bedside commode    Recommendations for Other Services      Precautions / Restrictions Precautions Precautions: Fall Restrictions Weight Bearing Restrictions: No       Mobility Bed Mobility Overal bed mobility: Needs Assistance Bed Mobility: Supine to Sit   Sidelying to sit: Min assist       General bed mobility comments: Assist to bring LEs to EOB, scoot bottom and elevate trunk.   Transfers Overall transfer level: Needs assistance Equipment used: Rolling walker (2 wheeled);None Transfers: Sit to/from Stand Sit to Stand: Min assist              Balance Overall balance assessment: Needs assistance Sitting-balance support: Feet supported;Single extremity supported     Postural control: Left lateral lean Standing balance support: During functional activity;Bilateral upper extremity supported Standing balance-Leahy Scale: Poor                             ADL either performed or assessed with clinical judgement   ADL Overall ADL's : Needs assistance/impaired     Grooming: Minimal assistance;Sitting   Upper Body Bathing: Moderate assistance;Sitting Upper Body Bathing Details  (indicate cue type and reason): simulated             Toilet Transfer: Ambulation;RW;BSC;Cueing for safety Toilet Transfer Details (indicate cue type and reason): had BM Toileting- Clothing Manipulation and Hygiene: Total assistance;Sit to/from stand         General ADL Comments: pt with slow pace of mobility     Vision Patient Visual Report: No change from baseline     Perception     Praxis      Cognition Arousal/Alertness: Awake/alert Behavior During Therapy: WFL for tasks assessed/performed Overall Cognitive Status: Impaired/Different from baseline Area of Impairment: Orientation;Memory;Awareness;Attention                     Memory: Decreased short-term memory Following Commands: Follows one step commands with increased time;Follows multi-step commands inconsistently Safety/Judgement: Decreased awareness of safety;Decreased awareness of deficits   Problem Solving: Slow processing          Exercises     Shoulder Instructions       General Comments      Pertinent Vitals/ Pain       Pain Assessment: No/denies pain  Home Living                                          Prior Functioning/Environment              Frequency  Min 2X/week        Progress Toward Goals  OT Goals(current goals can now be  found in the care plan section)  Progress towards OT goals: Progressing toward goals     Plan Discharge plan remains appropriate    Co-evaluation                 AM-PAC OT "6 Clicks" Daily Activity     Outcome Measure   Help from another person eating meals?: None Help from another person taking care of personal grooming?: A Little Help from another person toileting, which includes using toliet, bedpan, or urinal?: A Lot Help from another person bathing (including washing, rinsing, drying)?: A Lot Help from another person to put on and taking off regular upper body clothing?: A Little Help from another  person to put on and taking off regular lower body clothing?: Total 6 Click Score: 15    End of Session Equipment Utilized During Treatment: Gait belt;Oxygen;Other (comment)(BSC)  OT Visit Diagnosis: Unsteadiness on feet (R26.81);Muscle weakness (generalized) (M62.81)   Activity Tolerance Patient tolerated treatment well   Patient Left with call bell/phone within reach;in chair;with chair alarm set   Nurse Communication          Time: 2800-3491 OT Time Calculation (min): 26 min  Charges: OT General Charges $OT Visit: 1 Visit OT Treatments $Self Care/Home Management : 8-22 mins $Therapeutic Activity: 98-112 mins     Britt Bottom 12/15/2018, 1:49 PM

## 2018-12-15 NOTE — Plan of Care (Signed)
  Problem: Clinical Measurements: Goal: Diagnostic test results will improve Outcome: Progressing Goal: Respiratory complications will improve Outcome: Progressing Goal: Cardiovascular complication will be avoided Outcome: Progressing   Problem: Pain Managment: Goal: General experience of comfort will improve Outcome: Progressing   Problem: Safety: Goal: Ability to remain free from injury will improve Outcome: Progressing   

## 2018-12-15 NOTE — Progress Notes (Signed)
PROGRESS NOTE    Amanda [name]  QMV:[id number] DOB: [birth year] DOA: 12/09/2018 PCP: Aretta Nip, MD  Brief Narrative: This 73 year old female with history of atrial fibrillation on Eliquis, mitral regurgitation, sleep apnea, hypertension, diabetes mellitus, her atrial fibrillation was recently treated with metoprolol, Eliquis and flecainide. -Presented to the emergency room with weakness dizziness nausea and vomiting, in the emergency room she was found to be hypotensive with blood pressure in the 70s, also found to have acute kidney injury, with a creatinine of 3, abnormal LFTs with AST of 3257, ALT 1690, total bili of 3.6 and CK of 1150. -CT abdomen pelvis was unremarkable -She was admitted to the ICU, hydrated, work-up for causes of liver failure was unremarkable, also being followed by cardiology for atrial fibrillation with rapid ventricular response, at one  point transfer to [facility] was being considered while in ICU, this was canceled with improvement in LFTs -Transferred from PCCM to [facility] service 1/28  Assessment & Plan:   Hypotension -Resolved, EMS reported initial blood pressure of 70/30 when they initiated fluid bolus, I suspect she may have been hypotensive for a few days prior to admission as she reported symptoms of weakness dizziness for 3 to 4 days prior to admission -Initial ED notes from 1/23 indicate hypovolemic shock, inability to obtain manual blood pressure before boluses started. -Resolved, has been aggressively hydrated and is 10 L positive -Could be due to hypovolemia, medications, severe MR could also be contributing -IV fluids were discontinued yesterday, now volume overloaded, started on diuretics  Acute liver failure -I suspect this is secondary to shock liver, patient was hypotensive with SBP in the 70s per EMS when fluid bolus was initiated -Hepatitis serology negative, Tylenol level less than 10, CT and ultrasound without acute gallbladder or  biliary pathology -Work-up for autoimmune hepatitis is also negative -AST ALT and total bilirubin trending now down nicely from 3257/1690 and total bili of 3.6 on admission to 354/55 now, total bili is 1.3 -continue to trend LFTs, no further workup needed at this time -PCCM had initiated transfer to tertiary center for liver failure which was canceled 1/27  Lethargy -due to BID Klonopin, decreased hepatic metabolism/clearance, discontinued -Ammonia levels were mildly elevated, continue lactulose -Improved considerably today  Volume overload -This is iatrogenic due to aggressive fluid resuscitation -She is 9 to 10 L positive yesterday, she was started on IV Lasix -Diuresed 3.2 L in the last 24 hours -continue IV lasix today  Acute kidney injury -Due to volume depletion, shock -Creatinine was 1.9 on admission, resolved  Atrial fibrillation with rapid ventricular response -Back in atrial fibrillation this morning, heart rate is controlled this morning appears to be in sinus rhythm, continue metoprolol, anticoagulation on hold due to liver failure,  -Given improvement in liver function, she is started on Eliquis -Continue Cardizem CD  Severe mitral regurgitation -Per cardiology  Diabetes mellitus -metformin on hold, continue sliding scale insulin -CBGs are stable  DVT prophylaxis: Coagulopathic from liver failure Code Status: Full code Family Communication: No family at bedside Disposition Plan: Discharge planning, SNF for rehab  Consultants:   PCCM  Cardiology   Procedures:   Antimicrobials:    Subjective: -Feels better today, still weak and tired, sleepy  Objective: Vitals:   12/15/18 0355 12/15/18 0658 12/15/18 1000 12/15/18 1001  BP:   (!) 144/74   Pulse:  88  79  Resp:      Temp:      TempSrc:  SpO2:      Weight: 90 kg     Height:        Intake/Output Summary (Last 24 hours) at 12/15/2018 1131 Last data filed at 12/15/2018 1119 Gross per 24 hour    Intake 840 ml  Output 4200 ml  Net -3360 ml   Filed Weights   12/13/18 2053 12/14/18 0406 12/15/18 0355  Weight: 97.6 kg 97.4 kg 90 kg    Examination:  Gen: Frail, chronically ill-appearing female, more lucid and interactive today, oriented to self place  HEENT: PERRLA, Neck supple, no JVD Lungs: Decreased breath sounds at both bases, good air movement CVS: RRR,No Gallops,Rubs or new Murmurs Abd: Soft, nontender, mildly distended, bowel sounds present Extremities: 1+ edema Skin: no new rashes Neuro: Moves all extremities, no localizing signs, no asterixis   Data Reviewed:   CBC: Recent Labs  Lab 12/09/18 1613  12/11/18 0344 12/12/18 0354 12/13/18 0503 12/14/18 0416 12/15/18 0701  WBC 15.2*   < > 15.5* 14.3* 13.9* 15.5* 12.6*  NEUTROABS 12.2*  --   --   --   --   --   --   HGB 13.0   < > 11.0* 10.9* 11.5* 11.5* 11.8*  HCT 43.8   < > 34.4* 34.1* 36.2 36.5 37.8  MCV 93.4   < > 88.2 87.2 88.5 88.2 88.3  PLT 294   < > 217 196 195 183 152   < > = values in this interval not displayed.   Basic Metabolic Panel: Recent Labs  Lab 12/09/18 1613  12/10/18 0556 12/11/18 0344 12/12/18 0354 12/12/18 0800 12/12/18 1243 12/12/18 1402 12/12/18 2045 12/13/18 0503 12/14/18 0416 12/15/18 0701  NA 135   < > 137 136 137  --  133*  --   --  132* 129* 135  K 5.7*   < > 4.0 2.8* <2.0*  --  2.1* 2.7* 3.3* 3.3* 4.0 2.6*  CL 100   < > 102 96* 98  --  98  --   --  101 95* 101  CO2 11*   < > 14* 23 26  --  23  --   --  24 23 23   GLUCOSE 81   < > 224* 204* 157*  --  330*  --   --  155* 141* 127*  BUN 29*   < > 32* 28* 23  --  22  --   --  22 27* 22  CREATININE 1.91*   < > 1.63* 1.08* 0.83  --  0.82  --   --  0.90 0.95 0.80  CALCIUM 9.6   < > 8.4* 8.5* 8.4*  --  8.3*  --   --  8.6* 9.0 8.8*  MG 2.0  --  1.7 1.7  --  1.9  --   --   --  1.8  --   --    < > = values in this interval not displayed.   GFR: Estimated Creatinine Clearance: 70.1 mL/min (by C-G formula based on SCr of 0.8  mg/dL). Liver Function Tests: Recent Labs  Lab 12/11/18 0344 12/12/18 0354 12/13/18 0503 12/14/18 0416 12/15/18 0701  AST 2,219* 564* 187* 88* 55*  ALT 1,648* 1,032* 749* 528* 354*  ALKPHOS 78 72 79 69 66  BILITOT 2.7* 2.1* 2.3* 1.8* 1.3*  PROT 5.3* 4.8* 4.9* 5.3* 5.0*  ALBUMIN 3.3* 2.9* 3.0* 3.0* 2.7*   Recent Labs  Lab 12/09/18 1613  LIPASE 22   Recent Labs  Lab  12/10/18 0730 12/14/18 1043  AMMONIA 44* 47*   Coagulation Profile: Recent Labs  Lab 12/11/18 0344 12/11/18 1638 12/12/18 0800 12/13/18 0707 12/14/18 0416  INR 6.71* 3.68 2.52 1.75 1.48   Cardiac Enzymes: Recent Labs  Lab 12/09/18 1950 12/10/18 0556 12/11/18 0344 12/12/18 0800 12/13/18 0503  CKTOTAL 1,150* 2,661* 3,197* 777* 290*   BNP (last 3 results) No results for input(s): PROBNP in the last 8760 hours. HbA1C: No results for input(s): HGBA1C in the last 72 hours. CBG: Recent Labs  Lab 12/14/18 2150 12/14/18 2259 12/15/18 0029 12/15/18 0348 12/15/18 0745  GLUCAP 214* 209* 185* 112* 117*   Lipid Profile: No results for input(s): CHOL, HDL, LDLCALC, TRIG, CHOLHDL, LDLDIRECT in the last 72 hours. Thyroid Function Tests: No results for input(s): TSH, T4TOTAL, FREET4, T3FREE, THYROIDAB in the last 72 hours. Anemia Panel: No results for input(s): VITAMINB12, FOLATE, FERRITIN, TIBC, IRON, RETICCTPCT in the last 72 hours. Urine analysis:    Component Value Date/Time   COLORURINE AMBER (A) 12/10/2018 0000   APPEARANCEUR HAZY (A) 12/10/2018 0000   LABSPEC 1.021 12/10/2018 0000   PHURINE 5.0 12/10/2018 0000   GLUCOSEU NEGATIVE 12/10/2018 0000   HGBUR LARGE (A) 12/10/2018 0000   BILIRUBINUR NEGATIVE 12/10/2018 0000   KETONESUR 20 (A) 12/10/2018 0000   PROTEINUR 100 (A) 12/10/2018 0000   UROBILINOGEN 0.2 10/15/2014 2020   NITRITE NEGATIVE 12/10/2018 0000   LEUKOCYTESUR NEGATIVE 12/10/2018 0000   Sepsis Labs: @LABRCNTIP (procalcitonin:4,lacticidven:4)  ) Recent Results (from the  past 240 hour(s))  Blood Culture (routine x 2)     Status: None   Collection Time: 12/09/18  5:51 PM  Result Value Ref Range Status   Specimen Description BLOOD RIGHT HAND  Final   Special Requests   Final    BOTTLES DRAWN AEROBIC AND ANAEROBIC Blood Culture results may not be optimal due to an inadequate volume of blood received in culture bottles   Culture   Final    NO GROWTH 5 DAYS Performed at [facility], [place] [address] [place], [place] [address]    Report Status 12/14/2018 FINAL  Final  Blood Culture (routine x 2)     Status: None   Collection Time: 12/09/18  6:06 PM  Result Value Ref Range Status   Specimen Description BLOOD LEFT HAND  Final   Special Requests   Final    BOTTLES DRAWN AEROBIC AND ANAEROBIC Blood Culture results may not be optimal due to an inadequate volume of blood received in culture bottles   Culture   Final    NO GROWTH 5 DAYS Performed at [facility], [place] [address] [place], [place] [address]    Report Status 12/14/2018 FINAL  Final  MRSA PCR Screening     Status: None   Collection Time: 12/09/18 11:00 PM  Result Value Ref Range Status   MRSA by PCR NEGATIVE NEGATIVE Final    Comment:        The GeneXpert MRSA Assay (FDA approved for NASAL specimens only), is one component of a comprehensive MRSA colonization surveillance program. It is not intended to diagnose MRSA infection nor to guide or monitor treatment for MRSA infections. Performed at [facility], [place] [address] [place],  [address]   Urine culture     Status: None   Collection Time: 12/10/18 12:01 AM  Result Value Ref Range Status   Specimen Description URINE, RANDOM  Final   Special Requests   Final    NONE  Performed at [facility], [place] [address] [place], [place] [address]    Culture NO GROWTH  Final   Report Status 12/11/2018 FINAL  Final         Radiology Studies: No results found.      Scheduled Meds: . apixaban  5 mg Oral  BID  . diltiazem  120 mg Oral Daily  . feeding supplement  1 Container Oral TID BM  . feeding supplement (PRO-STAT SUGAR FREE 64)  30 mL Oral TID BM  . furosemide  40 mg Intravenous BID  . insulin aspart  0-15 Units Subcutaneous Q4H  . insulin glargine  10 Units Subcutaneous QHS  . lactulose  20 g Oral TID  . levothyroxine  25 mcg Oral QAC breakfast  . mouth rinse  15 mL Mouth Rinse BID  . metoprolol tartrate  50 mg Oral BID  . multivitamin with minerals  1 tablet Oral Daily  . potassium chloride  40 mEq Oral Q2H  . senna-docusate  1 tablet Oral BID   Continuous Infusions:   LOS: 6 days    Time spent: 81min    Domenic Polite, MD [facility]  12/15/2018, 11:31 AM

## 2018-12-15 NOTE — Progress Notes (Signed)
Progress Note  Patient Name: Amanda [name] Date of Encounter: 12/15/2018  Primary Cardiologist: Ena Dawley, MD    Subjective   More awake today - ammonia modestly elevated yesterday, but on lactulose. Good response to diuretics with 2.5L negative - hypokalemic today, being repleted. Sodium has normalized. Creatinine is normal. AST/ALT continues to improve. Leukocytosis has improved.  Inpatient Medications    Scheduled Meds: . diltiazem  30 mg Oral Q8H  . feeding supplement  1 Container Oral TID BM  . furosemide  40 mg Intravenous BID  . insulin aspart  0-15 Units Subcutaneous Q4H  . insulin glargine  10 Units Subcutaneous QHS  . lactulose  20 g Oral TID  . levothyroxine  25 mcg Oral QAC breakfast  . mouth rinse  15 mL Mouth Rinse BID  . metoprolol tartrate  50 mg Oral BID  . potassium chloride  40 mEq Oral Q2H  . senna-docusate  1 tablet Oral BID   Continuous Infusions:  PRN Meds: bisacodyl, ondansetron, polyethylene glycol, sodium phosphate, traMADol   Vital Signs    Vitals:   12/14/18 2302 12/15/18 0347 12/15/18 0355 12/15/18 0658  BP: (!) 125/58 125/79    Pulse: 77 67  88  Resp:  18    Temp:      TempSrc:      SpO2:  100%    Weight:   90 kg   Height:        Intake/Output Summary (Last 24 hours) at 12/15/2018 0851 Last data filed at 12/15/2018 0200 Gross per 24 hour  Intake 720 ml  Output 3200 ml  Net -2480 ml   Last 3 Weights 12/15/2018 12/14/2018 12/13/2018  Weight (lbs) 198 lb 6.6 oz 214 lb 11.7 oz 215 lb 2.7 oz  Weight (kg) 90 kg 97.4 kg 97.6 kg      Telemetry    Afib with CVR - Personally Reviewed  ECG    N/A  Physical Exam   General appearance: icteric, mild distress and somnolent Neck: JVD - 1 cm above sternal notch, no carotid bruit and thyroid not enlarged, symmetric, no tenderness/mass/nodules Lungs: diminished breath sounds bilaterally Heart: irregularly irregular rhythm and systolic murmur: early systolic 2/6, blowing at  apex Abdomen: protuberant, firm, mild TTP in RLQ Extremities: edema 1+ edema Pulses: 2+ and symmetric Skin: pale, ?mild jaundice Neurologic: Mental status: somnolent, arouses to stimuli PSych: Cannot assess  Labs    Chemistry Recent Labs  Lab 12/13/18 0503 12/14/18 0416 12/15/18 0701  NA 132* 129* 135  K 3.3* 4.0 2.6*  CL 101 95* 101  CO2 24 23 23   GLUCOSE 155* 141* 127*  BUN 22 27* 22  CREATININE 0.90 0.95 0.80  CALCIUM 8.6* 9.0 8.8*  PROT 4.9* 5.3* 5.0*  ALBUMIN 3.0* 3.0* 2.7*  AST 187* 88* 55*  ALT 749* 528* 354*  ALKPHOS 79 69 66  BILITOT 2.3* 1.8* 1.3*  GFRNONAA >60 59* >60  GFRAA >60 >60 >60  ANIONGAP 7 11 11      Hematology Recent Labs  Lab 12/13/18 0503 12/14/18 0416 12/15/18 0701  WBC 13.9* 15.5* 12.6*  RBC 4.09 4.14 4.28  HGB 11.5* 11.5* 11.8*  HCT 36.2 36.5 37.8  MCV 88.5 88.2 88.3  MCH 28.1 27.8 27.6  MCHC 31.8 31.5 31.2  RDW 15.6* 15.9* 16.3*  PLT 195 183 152    Cardiac EnzymesNo results for input(s): TROPONINI in the last 168 hours. No results for input(s): TROPIPOC in the last 168 hours.   BNPNo results  for input(s): BNP, PROBNP in the last 168 hours.   DDimer No results for input(s): DDIMER in the last 168 hours.   Radiology    No results found.  Cardiac Studies   TEE EF 50-55% severe MR   Patient Profile     Amanda [name] is a 73 y.o. female with a hx of hypertension, hyperlipidemia, hypothyroidism, atrial fibrillation s/p DCCV 09/2018 on Eliquis, OSA on CPAP and frequent falls who is being seen today for the evaluation of atrial fibrillation with RVR at the request of Dr. Reesa Chew.  Assessment & Plan    Afib:  Rate control adequate no anticoagulation given liver failure continue oral short-acting cardizem no history of CAD and EF low normal by TTE, however, severe MR noted. Change cardizem to LA today. I have estimated her Child-Pugh class as B - based on this, Eliquis dose adjustment is not defined, but considered acceptable to  use. Platelets are normal and INR is not elevated. Would recommend restarting Eliquis 5 mg BID for afib stroke risk reduction (CHADSVASC score of 4).  MR:  Severe by TEE not a candidate for intervention may be clip candidate if she recovers from  Hepatic failure - she is +8L positive (+10 KG since admit) - - continue IV diuresis today  Hepatic failure acetylcystine and LR hydration - not sure she needs transfer to [facility]. Etiology unclear - ?overdose-toxin, vs CBD stone? Complains of abdominal pain and constipation today - would benefit from GI evaluation. More arousable today - able to hold a conversation with her. Liver enzymes declining - on lactulose.  Volume overload: Recommended d/c fluids yesterday and diuresis. Will initiate diuresis today - progressive hyponatremia, likely hypervolemic hyponatremia.   - sodium improving with diuresis, replete potassium.  For questions or updates, please contact [place] Please consult [email or web address] for contact info under   Pixie Casino, MD, FACC, [place] Director of the [facility] Diplomate of the American Board of Clinical Lipidology Attending Cardiologist  Direct Dial: [phone]  Fax: [phone]  Website:  [email or web address]  Pixie Casino, MD  12/15/2018, 8:51 AM

## 2018-12-16 LAB — GLUCOSE, CAPILLARY
GLUCOSE-CAPILLARY: 256 mg/dL — AB (ref 70–99)
Glucose-Capillary: 183 mg/dL — ABNORMAL HIGH (ref 70–99)
Glucose-Capillary: 190 mg/dL — ABNORMAL HIGH (ref 70–99)
Glucose-Capillary: 192 mg/dL — ABNORMAL HIGH (ref 70–99)
Glucose-Capillary: 197 mg/dL — ABNORMAL HIGH (ref 70–99)
Glucose-Capillary: 299 mg/dL — ABNORMAL HIGH (ref 70–99)
Glucose-Capillary: 91 mg/dL (ref 70–99)

## 2018-12-16 LAB — COMPREHENSIVE METABOLIC PANEL
ALT: 254 U/L — ABNORMAL HIGH (ref 0–44)
ANION GAP: 9 (ref 5–15)
AST: 44 U/L — ABNORMAL HIGH (ref 15–41)
Albumin: 2.6 g/dL — ABNORMAL LOW (ref 3.5–5.0)
Alkaline Phosphatase: 63 U/L (ref 38–126)
BUN: 22 mg/dL (ref 8–23)
CO2: 26 mmol/L (ref 22–32)
Calcium: 8.4 mg/dL — ABNORMAL LOW (ref 8.9–10.3)
Chloride: 101 mmol/L (ref 98–111)
Creatinine, Ser: 0.84 mg/dL (ref 0.44–1.00)
GFR calc non Af Amer: >60 mL/min (ref >60)
Glucose, Bld: 156 mg/dL — ABNORMAL HIGH (ref 70–99)
Potassium: 2.9 mmol/L — ABNORMAL LOW (ref 3.5–5.1)
Sodium: 136 mmol/L (ref 135–145)
Total Bilirubin: 1.1 mg/dL (ref 0.3–1.2)
Total Protein: 4.8 g/dL — ABNORMAL LOW (ref 6.5–8.1)

## 2018-12-16 LAB — CBC
HCT: 38 % (ref 36.0–46.0)
Hemoglobin: 11.7 g/dL — ABNORMAL LOW (ref 12.0–15.0)
MCH: 27.7 pg (ref 26.0–34.0)
MCHC: 30.8 g/dL (ref 30.0–36.0)
MCV: 90 fL (ref 80.0–100.0)
Platelets: 150 10*3/uL (ref 150–400)
RBC: 4.22 MIL/uL (ref 3.87–5.11)
RDW: 16.4 % — AB (ref 11.5–15.5)
WBC: 11.6 10*3/uL — ABNORMAL HIGH (ref 4.0–10.5)
nRBC: 0 % (ref 0.0–0.2)

## 2018-12-16 LAB — MAGNESIUM: Magnesium: 1.5 mg/dL — ABNORMAL LOW (ref 1.7–2.4)

## 2018-12-16 MED ORDER — ALUM & MAG HYDROXIDE-SIMETH 200-200-20 MG/5ML PO SUSP
15.0000 mL | ORAL | Status: DC | PRN
  Administered 2018-12-16: 15 mL via ORAL
  Filled 2018-12-16: qty 30

## 2018-12-16 MED ORDER — MAGNESIUM SULFATE 2 GM/50ML IV SOLN
2.0000 g | Freq: Once | INTRAVENOUS | Status: AC
  Administered 2018-12-16: 2 g via INTRAVENOUS
  Filled 2018-12-16: qty 50

## 2018-12-16 MED ORDER — POTASSIUM CHLORIDE 10 MEQ/100ML IV SOLN
10.0000 meq | INTRAVENOUS | Status: AC
  Administered 2018-12-16 (×4): 10 meq via INTRAVENOUS
  Filled 2018-12-16 (×4): qty 100

## 2018-12-16 MED ORDER — PRO-STAT SUGAR FREE PO LIQD
30.0000 mL | Freq: Two times a day (BID) | ORAL | Status: DC
  Administered 2018-12-16 – 2018-12-21 (×10): 30 mL via ORAL
  Filled 2018-12-16 (×10): qty 30

## 2018-12-16 MED ORDER — PANTOPRAZOLE SODIUM 40 MG PO TBEC
40.0000 mg | DELAYED_RELEASE_TABLET | Freq: Every day | ORAL | Status: DC
  Administered 2018-12-16 – 2018-12-21 (×6): 40 mg via ORAL
  Filled 2018-12-16 (×6): qty 1

## 2018-12-16 MED ORDER — SODIUM CHLORIDE 0.9 % IV SOLN
INTRAVENOUS | Status: DC | PRN
  Administered 2018-12-16: 10:00:00 via INTRAVENOUS

## 2018-12-16 MED ORDER — POTASSIUM CHLORIDE 10 MEQ/100ML IV SOLN
10.0000 meq | INTRAVENOUS | Status: AC
  Administered 2018-12-16 (×2): 10 meq via INTRAVENOUS
  Filled 2018-12-16 (×2): qty 100

## 2018-12-16 MED ORDER — POTASSIUM CHLORIDE CRYS ER 20 MEQ PO TBCR
40.0000 meq | EXTENDED_RELEASE_TABLET | Freq: Once | ORAL | Status: AC
  Administered 2018-12-16: 40 meq via ORAL
  Filled 2018-12-16: qty 2

## 2018-12-16 MED ORDER — BOOST / RESOURCE BREEZE PO LIQD CUSTOM
1.0000 | Freq: Two times a day (BID) | ORAL | Status: DC
  Administered 2018-12-16 – 2018-12-21 (×10): 1 via ORAL

## 2018-12-16 NOTE — Progress Notes (Signed)
Nutrition Follow-up  DOCUMENTATION CODES:   Obesity unspecified  INTERVENTION:   -Continue MVI with minerals daily -Decrease Boost Breeze po to BID, each supplement provides 250 kcal and 9 grams of protein -Decrease 30 Prostat to BID, each supplement provides 100 kcals and 15 grams protein  NUTRITION DIAGNOSIS:   Inadequate oral intake related to lethargy/confusion, decreased appetite as evidenced by meal completion < 25%.  Progressing  GOAL:   Patient will meet greater than or equal to 90% of their needs  Progressing  MONITOR:   PO intake, Supplement acceptance, Labs, Weight trends, Skin, I & O's  REASON FOR ASSESSMENT:   Consult Assessment of nutrition requirement/status  ASSESSMENT:   73 year old female presented 1/23 with nausea vomiting and weakness found to be in fulminant hepatic failure.  Reviewed I/O's: -2.2 L x 24 hours and +4.7 L since admission  Pt receiving bath from nurse tech at time of visit.   Case discussed with RN, who reports is much more alert today than earlier this week. Her intake and appetite have improved (noted meal completion 75-100%). RN reports that pt takes supplements well- pt is taking Prostat and also prefers the [employer] of Boost Breeze.   RN confirm plan to d/c to SNF once medically stable.   Labs reviewed: K: 2.9 (on PO supplementation), Mg: 1.5, CBGS: 91-190 (inpatient orders for glycemic control are 0-15 units insulin aspart every 4 hours and 10 units insulin glargine BID).   Diet Order:   Diet Order            Diet heart healthy/carb modified Room service appropriate? Yes; Fluid consistency: Thin  Diet effective now              EDUCATION NEEDS:   Not appropriate for education at this time  Skin:  Skin Assessment: Reviewed RN Assessment  Last BM:  12/16/18  Height:   Ht Readings from Last 1 Encounters:  12/13/18 5' 5.5" (1.664 m)    Weight:   Wt Readings from Last 1 Encounters:  12/16/18 89.9 kg     Ideal Body Weight:  58.2 kg  BMI:  Body mass index is 32.48 kg/m.  Estimated Nutritional Needs:   Kcal:  1800-2000  Protein:  90-105 grams  Fluid:  1.8-2.0 L    Jenise Iannelli A. Jimmye Norman, RD, LDN, CDE Pager: [phone] After hours Pager: [phone]

## 2018-12-16 NOTE — Discharge Instructions (Signed)

## 2018-12-16 NOTE — Progress Notes (Signed)
PROGRESS NOTE    Amanda [name]  KPV:[id number] DOB: [birth year] DOA: 12/09/2018 PCP: Aretta Nip, MD   Brief Narrative: Patient is a 73 year old female with past medical history of A. fib on Eliquis, mitral regurgitation, sleep apnea, hypertension, diabetes mellitus who presented here with complaints of weakness, dizziness, nausea and vomiting.  She was found to be hypotensive with blood pressure in the range of 70s and acute kidney injury with creatinine of 3 and elevated liver enzymes.  She was initially admitted to ICU service.  Work-up for liver failure was unremarkable and was thought to be secondary to shock liver.  Cardiology also following for A. fib with RVR.  Currently she is waiting for skilled nursing facility bed.  Assessment & Plan:   Active Problems:   Acute liver failure  Hypotension: Resolved.  Currently normotensive.  Thought to be secondary to hypovolemia, medications, severe MR.  IV fluids discontinued.  Acute liver failure: Suspected to be from shock liver from hypotension.  Hepatitis serology negative.  CT imaging did not show any acute gallbladder or biliary pathology.  Liver enzymes improving.  Work-up for autoimmune hepatitis negative.  Volume overload: Due to IV fluids.  IV fluids have been stopped.  She was treated with few doses of IV Lasix.  Currently she is euvolemic.  She does not have peripheral edema.  Acute kidney injury: Resolved  A. fib with RVR: Currently rate is controlled.  On metoprolol.  Eliquis has been restarted.  Also on Cardizem CD.  Severe mitral regurgitation: Cardiology following.  She will follow-up with cardiology on discharge.  She can be a candidate for clip procedure.  Diabetes mellitus type 2: Continue sliding scale insulin here.  Severe hypokalemia/hypomagnesemia: Being supplemented.  Debility/deconditioning: PT recommending skilled nursing facility on discharge.  Social worker following.  Patient is medically stable for  discharge to skilled nursing facility as soon as the bed is available.  Nutrition Problem: Inadequate oral intake Etiology: lethargy/confusion, decreased appetite      DVT prophylaxis: Eliquis Code Status: Full Family Communication: Family members present at the bedside Disposition Plan: Skilled nursing facility as soon as the bed is available   Consultants: Cardiology  Procedures: None  Antimicrobials:  Anti-infectives (From admission, onward)   Start     Dose/Rate Route Frequency Ordered Stop   12/10/18 1900  cefTRIAXone (ROCEPHIN) 1 g in sodium chloride 0.9 % 100 mL IVPB  Status:  Discontinued     1 g 200 mL/hr over 30 Minutes Intravenous Every 24 hours 12/09/18 2157 12/12/18 1005   12/10/18 0400  metroNIDAZOLE (FLAGYL) IVPB 500 mg  Status:  Discontinued     500 mg 100 mL/hr over 60 Minutes Intravenous Every 8 hours 12/09/18 2157 12/12/18 1005   12/09/18 1715  cefTRIAXone (ROCEPHIN) 1 g in sodium chloride 0.9 % 100 mL IVPB     1 g 200 mL/hr over 30 Minutes Intravenous  Once 12/09/18 1702 12/09/18 2103   12/09/18 1715  metroNIDAZOLE (FLAGYL) IVPB 500 mg     500 mg 100 mL/hr over 60 Minutes Intravenous  Once 12/09/18 1702 12/09/18 2103      Subjective: Patient seen and examined the bedside this morning.  Remains comfortable.  Hemodynamically stable.  Looks weak and debilitated.  No peripheral edema.  Denies any chest pain or shortness of breath.  Heart rate well controlled.  Objective: Vitals:   12/15/18 2227 12/16/18 0337 12/16/18 0900 12/16/18 1100  BP: 133/66 (!) 102/59 (!) 143/66 134/83  Pulse: 76 70 75  75  Resp:  18  18  Temp:  98 F (36.7 C)    TempSrc:  Oral    SpO2:  96%  100%  Weight:  89.9 kg    Height:        Intake/Output Summary (Last 24 hours) at 12/16/2018 1211 Last data filed at 12/16/2018 1000 Gross per 24 hour  Intake -  Output 1600 ml  Net -1600 ml   Filed Weights   12/14/18 0406 12/15/18 0355 12/16/18 0337  Weight: 97.4 kg 90 kg 89.9 kg     Examination:  General exam: Elderly debilitated female  HEENT:PERRL,Oral mucosa moist, Ear/Nose normal on gross exam Respiratory system: Bilateral equal air entry, normal vesicular breath sounds, no wheezes or crackles  Cardiovascular system: S1 & S2 heard, RRR. No JVD, murmurs, rubs, gallops or clicks. No pedal edema. Gastrointestinal system: Abdomen is nondistended, soft and nontender. No organomegaly or masses felt. Normal bowel sounds heard. Central nervous system: Alert and oriented. No focal neurological deficits. Extremities: No edema, no clubbing ,no cyanosis, distal peripheral pulses palpable. Skin: No rashes, lesions or ulcers,no icterus ,no pallor MSK: Normal muscle bulk,tone ,power Psychiatry: Judgement and insight appear normal. Mood & affect appropriate.     Data Reviewed: I have personally reviewed following labs and imaging studies  CBC: Recent Labs  Lab 12/09/18 1613  12/12/18 0354 12/13/18 0503 12/14/18 0416 12/15/18 0701 12/16/18 0428  WBC 15.2*   < > 14.3* 13.9* 15.5* 12.6* 11.6*  NEUTROABS 12.2*  --   --   --   --   --   --   HGB 13.0   < > 10.9* 11.5* 11.5* 11.8* 11.7*  HCT 43.8   < > 34.1* 36.2 36.5 37.8 38.0  MCV 93.4   < > 87.2 88.5 88.2 88.3 90.0  PLT 294   < > 196 195 183 152 150   < > = values in this interval not displayed.   Basic Metabolic Panel: Recent Labs  Lab 12/10/18 0556 12/11/18 0344  12/12/18 0800 12/12/18 1243  12/12/18 2045 12/13/18 0503 12/14/18 0416 12/15/18 0701 12/16/18 0428  NA 137 136   < >  --  133*  --   --  132* 129* 135 136  K 4.0 2.8*   < >  --  2.1*   < > 3.3* 3.3* 4.0 2.6* 2.9*  CL 102 96*   < >  --  98  --   --  101 95* 101 101  CO2 14* 23   < >  --  23  --   --  24 23 23 26   GLUCOSE 224* 204*   < >  --  330*  --   --  155* 141* 127* 156*  BUN 32* 28*   < >  --  22  --   --  22 27* 22 22  CREATININE 1.63* 1.08*   < >  --  0.82  --   --  0.90 0.95 0.80 0.84  CALCIUM 8.4* 8.5*   < >  --  8.3*  --   --   8.6* 9.0 8.8* 8.4*  MG 1.7 1.7  --  1.9  --   --   --  1.8  --   --  1.5*   < > = values in this interval not displayed.   GFR: Estimated Creatinine Clearance: 66.8 mL/min (by C-G formula based on SCr of 0.84 mg/dL). Liver Function Tests: Recent Labs  Lab 12/12/18 0354  12/13/18 0503 12/14/18 0416 12/15/18 0701 12/16/18 0428  AST 564* 187* 88* 55* 44*  ALT 1,032* 749* 528* 354* 254*  ALKPHOS 72 79 69 66 63  BILITOT 2.1* 2.3* 1.8* 1.3* 1.1  PROT 4.8* 4.9* 5.3* 5.0* 4.8*  ALBUMIN 2.9* 3.0* 3.0* 2.7* 2.6*   Recent Labs  Lab 12/09/18 1613  LIPASE 22   Recent Labs  Lab 12/10/18 0730 12/14/18 1043  AMMONIA 44* 47*   Coagulation Profile: Recent Labs  Lab 12/11/18 0344 12/11/18 1638 12/12/18 0800 12/13/18 0707 12/14/18 0416  INR 6.71* 3.68 2.52 1.75 1.48   Cardiac Enzymes: Recent Labs  Lab 12/09/18 1950 12/10/18 0556 12/11/18 0344 12/12/18 0800 12/13/18 0503  CKTOTAL 1,150* 2,661* 3,197* 777* 290*   BNP (last 3 results) No results for input(s): PROBNP in the last 8760 hours. HbA1C: No results for input(s): HGBA1C in the last 72 hours. CBG: Recent Labs  Lab 12/15/18 2046 12/15/18 2228 12/16/18 0246 12/16/18 0814 12/16/18 1133  GLUCAP 100* 103* 190* 91 197*   Lipid Profile: No results for input(s): CHOL, HDL, LDLCALC, TRIG, CHOLHDL, LDLDIRECT in the last 72 hours. Thyroid Function Tests: No results for input(s): TSH, T4TOTAL, FREET4, T3FREE, THYROIDAB in the last 72 hours. Anemia Panel: No results for input(s): VITAMINB12, FOLATE, FERRITIN, TIBC, IRON, RETICCTPCT in the last 72 hours. Sepsis Labs: Recent Labs  Lab 12/10/18 0548 12/10/18 1348 12/10/18 1845 12/12/18 0800  LATICACIDVEN 6.2* 6.8* 4.1* 1.6    Recent Results (from the past 240 hour(s))  Blood Culture (routine x 2)     Status: None   Collection Time: 12/09/18  5:51 PM  Result Value Ref Range Status   Specimen Description BLOOD RIGHT HAND  Final   Special Requests   Final     BOTTLES DRAWN AEROBIC AND ANAEROBIC Blood Culture results may not be optimal due to an inadequate volume of blood received in culture bottles   Culture   Final    NO GROWTH 5 DAYS Performed at [facility], [place] [address] [place], [place] [address]    Report Status 12/14/2018 FINAL  Final  Blood Culture (routine x 2)     Status: None   Collection Time: 12/09/18  6:06 PM  Result Value Ref Range Status   Specimen Description BLOOD LEFT HAND  Final   Special Requests   Final    BOTTLES DRAWN AEROBIC AND ANAEROBIC Blood Culture results may not be optimal due to an inadequate volume of blood received in culture bottles   Culture   Final    NO GROWTH 5 DAYS Performed at [facility], [place] [address] [place], [place] [address]    Report Status 12/14/2018 FINAL  Final  MRSA PCR Screening     Status: None   Collection Time: 12/09/18 11:00 PM  Result Value Ref Range Status   MRSA by PCR NEGATIVE NEGATIVE Final    Comment:        The GeneXpert MRSA Assay (FDA approved for NASAL specimens only), is one component of a comprehensive MRSA colonization surveillance program. It is not intended to diagnose MRSA infection nor to guide or monitor treatment for MRSA infections. Performed at [facility], [place] [address] [place], [place] [address]   Urine culture     Status: None   Collection Time: 12/10/18 12:01 AM  Result Value Ref Range Status   Specimen Description URINE, RANDOM  Final   Special Requests   Final    NONE Performed at  [facility], [place] [address], [place], [place] [address]    Culture NO GROWTH  Final   Report Status 12/11/2018 FINAL  Final         Radiology Studies: No results found.      Scheduled Meds: . apixaban  5 mg Oral BID  . diltiazem  120 mg Oral Daily  . feeding supplement  1 Container Oral BID BM  . feeding supplement (PRO-STAT SUGAR FREE 64)  30 mL Oral BID  . insulin aspart  0-15 Units Subcutaneous Q4H  . insulin  glargine  10 Units Subcutaneous QHS  . lactulose  20 g Oral TID  . levothyroxine  25 mcg Oral QAC breakfast  . mouth rinse  15 mL Mouth Rinse BID  . metoprolol tartrate  50 mg Oral BID  . multivitamin with minerals  1 tablet Oral Daily  . pantoprazole  40 mg Oral Daily  . senna-docusate  1 tablet Oral BID   Continuous Infusions: . sodium chloride    . magnesium sulfate 1 - 4 g bolus IVPB    . potassium chloride Stopped (12/16/18 1133)     LOS: 7 days    Time spent: More than 50% of that time was spent in counseling and/or coordination of care.      Shelly Coss, MD [facility] Pager [phone]  If 7PM-7AM, please contact night-coverage [email or web address] Password [email or web address] 12/16/2018, 12:11 PM

## 2018-12-16 NOTE — Progress Notes (Signed)
Pt voided but was unable to measure because it was mixed with stool. Post void bladder scan showed 0 ml in bladder.

## 2018-12-16 NOTE — Clinical Social Work Note (Addendum)
[facility] did not receive fax so resent. Left message for admissions coordinator at [facility].  Dayton Scrape, [place] [phone]  12:18 pm Left voicemail for [place] to see if referral came through.  Dayton Scrape, [place] [phone]  1:22 pm [facility] does not have any beds and are unsure when one will open up. Left another message for [facility] coordinator.  Dayton Scrape, [place] [phone]  2:58 pm Left voicemail for social worker at [facility] in [place]. Faxed referral to admissions coordinator, Lorriane Shire, at [facility] in [place]. Left voicemail for admissions coordinator at [facility] in [place]. Per admissions coordinator at [facility] in [place], they only take their own residents for rehab and only bring new residents in if there is a plan to transition them to their ALF afterwards. Left voicemail at [facility] in [place] to see if they have SNF. Left voicemail for admissions coordinator at [facility] in [place]. Per admissions coordinator at [facility] in [place], they are not in network with patient's insurance. Faxed referral to admissions coordinator, Raquel Sarna, at [facility] in [place]. Left voicemail for admissions coordinator at [facility] in [place].  Dayton Scrape, [place] [phone]  4:20 pm [facility] in [place] is able to offer a bed pending insurance authorization and confirmation that daughter can transport patient to follow up appointments if any are in [place]. Emailed daughter to provide updates on all facilities CSW has been reaching out to.  Dayton Scrape, [place]

## 2018-12-16 NOTE — Plan of Care (Signed)
  Problem: Health Behavior/Discharge Planning: Goal: Ability to manage health-related needs will improve Outcome: Progressing   Problem: Education: Goal: Knowledge of General Education information will improve Description: Including pain rating scale, medication(s)/side effects and non-pharmacologic comfort measures Outcome: Progressing   

## 2018-12-16 NOTE — Progress Notes (Signed)
Progress Note  Patient Name: Amanda [name] Date of Encounter: 12/16/2018  Primary Cardiologist: Ena Dawley, MD    Subjective   Net negative 2L overnight - creatinine remains stable - remains hypokalemic today - check magnesium (was 1.8 a few days ago). AST/ALT improving. On anticoagulation. Much more alert today - conversive, seems to be getting stronger.  Inpatient Medications     Scheduled Meds: . apixaban  5 mg Oral BID  . diltiazem  120 mg Oral Daily  . feeding supplement  1 Container Oral TID BM  . feeding supplement (PRO-STAT SUGAR FREE 64)  30 mL Oral TID BM  . insulin aspart  0-15 Units Subcutaneous Q4H  . insulin glargine  10 Units Subcutaneous QHS  . lactulose  20 g Oral TID  . levothyroxine  25 mcg Oral QAC breakfast  . mouth rinse  15 mL Mouth Rinse BID  . metoprolol tartrate  50 mg Oral BID  . multivitamin with minerals  1 tablet Oral Daily  . senna-docusate  1 tablet Oral BID   Continuous Infusions: . potassium chloride     PRN Meds: bisacodyl, ondansetron, polyethylene glycol, sodium phosphate, traMADol   Vital Signs    Vitals:   12/15/18 1932 12/15/18 2227 12/16/18 0337 12/16/18 0900  BP: 134/75 133/66 (!) 102/59 (!) 143/66  Pulse: 88 76 70 75  Resp: 18  18   Temp: (!) 97.2 F (36.2 C)  98 F (36.7 C)   TempSrc: Oral  Oral   SpO2: 96%  96%   Weight:   89.9 kg   Height:        Intake/Output Summary (Last 24 hours) at 12/16/2018 0918 Last data filed at 12/16/2018 0300 Gross per 24 hour  Intake 120 ml  Output 2100 ml  Net -1980 ml   Last 3 Weights 12/16/2018 12/15/2018 12/14/2018  Weight (lbs) 198 lb 3.2 oz 198 lb 6.6 oz 214 lb 11.7 oz  Weight (kg) 89.903 kg 90 kg 97.4 kg      Telemetry    Afib with CVR - Personally Reviewed  ECG    N/A  Physical Exam   General appearance: alert and no distress Neck: no carotid bruit, no JVD and thyroid not enlarged, symmetric, no tenderness/mass/nodules Lungs: diminished breath sounds  bilaterally Heart: irregularly irregular rhythm and systolic murmur: early systolic 2/6, blowing at apex Abdomen: soft, non-tender; bowel sounds normal; no masses,  no organomegaly Extremities: edema trace edema Pulses: 2+ and symmetric Skin: Skin color, texture, turgor normal. No rashes or lesions Neurologic: Mental status: Alert, oriented, thought content appropriate Psych: Pleasant  Labs    Chemistry Recent Labs  Lab 12/14/18 0416 12/15/18 0701 12/16/18 0428  NA 129* 135 136  K 4.0 2.6* 2.9*  CL 95* 101 101  CO2 23 23 26   GLUCOSE 141* 127* 156*  BUN 27* 22 22  CREATININE 0.95 0.80 0.84  CALCIUM 9.0 8.8* 8.4*  PROT 5.3* 5.0* 4.8*  ALBUMIN 3.0* 2.7* 2.6*  AST 88* 55* 44*  ALT 528* 354* 254*  ALKPHOS 69 66 63  BILITOT 1.8* 1.3* 1.1  GFRNONAA 59* >60 >60  GFRAA >60 >60 >60  ANIONGAP 11 11 9      Hematology Recent Labs  Lab 12/14/18 0416 12/15/18 0701 12/16/18 0428  WBC 15.5* 12.6* 11.6*  RBC 4.14 4.28 4.22  HGB 11.5* 11.8* 11.7*  HCT 36.5 37.8 38.0  MCV 88.2 88.3 90.0  MCH 27.8 27.6 27.7  MCHC 31.5 31.2 30.8  RDW 15.9* 16.3*  16.4*  PLT 183 152 150    Cardiac EnzymesNo results for input(s): TROPONINI in the last 168 hours. No results for input(s): TROPIPOC in the last 168 hours.   BNPNo results for input(s): BNP, PROBNP in the last 168 hours.   DDimer No results for input(s): DDIMER in the last 168 hours.   Radiology    No results found.  Cardiac Studies   TEE EF 50-55% severe MR   Patient Profile     Amanda [name] is a 73 y.o. female with a hx of hypertension, hyperlipidemia, hypothyroidism, atrial fibrillation s/p DCCV 09/2018 on Eliquis, OSA on CPAP and frequent falls who is being seen today for the evaluation of atrial fibrillation with RVR at the request of Dr. Reesa Chew.  Assessment & Plan    Afib:  Rate control adequate no anticoagulation given liver failure continue oral short-acting cardizem no history of CAD and EF low normal by TTE,  however, severe MR noted. Tolerating anticoagulation, on long-acting diltiazem.  MR:  Severe by TEE not a candidate for intervention may be clip candidate if she recovers from  Hepatic failure - she is +8L positive (+10 KG since admit) - - continue IV diuresis today  Hepatic failure acetylcystine and LR hydration - not sure she needs [facility]. Etiology unclear - ?overdose-toxin, vs CBD stone? Complains of abdominal pain and constipation today - would benefit from GI evaluation. - More awake today, liver enzymes continue to improve  Volume overload: Recommended d/c fluids yesterday and diuresis. Will initiate diuresis today - progressive hyponatremia, likely hypervolemic hyponatremia.   - sodium improving with diuresis, replete potassium. Check magnesium and replete.  For questions or updates, please contact [place] Please consult [email or web address] for contact info under   Pixie Casino, MD, FACC, [place] Director of the [facility]  [facility] Diplomate of the American Board of Clinical Lipidology Attending Cardiologist  Direct Dial: [phone]  Fax: [phone]  Website:  [email or web address]  Pixie Casino, MD  12/16/2018, 9:18 AM

## 2018-12-16 NOTE — Care Management Important Message (Signed)
Important Message  Patient Details  Name: Amanda [name] MRN: [id number] Date of Birth: 12-Sep-1946   Medicare Important Message Given:  Yes    Juliane Guest 12/16/2018, 3:15 PM

## 2018-12-16 NOTE — Progress Notes (Signed)
Foley removed, voiding trial started.

## 2018-12-17 LAB — COMPREHENSIVE METABOLIC PANEL
ALT: 183 U/L — ABNORMAL HIGH (ref 0–44)
AST: 35 U/L (ref 15–41)
Albumin: 2.5 g/dL — ABNORMAL LOW (ref 3.5–5.0)
Alkaline Phosphatase: 57 U/L (ref 38–126)
Anion gap: 8 (ref 5–15)
BUN: 18 mg/dL (ref 8–23)
CO2: 23 mmol/L (ref 22–32)
Calcium: 8.7 mg/dL — ABNORMAL LOW (ref 8.9–10.3)
Chloride: 104 mmol/L (ref 98–111)
Creatinine, Ser: 0.77 mg/dL (ref 0.44–1.00)
GFR calc Af Amer: >60 mL/min (ref >60)
Glucose, Bld: 152 mg/dL — ABNORMAL HIGH (ref 70–99)
Potassium: 3.6 mmol/L (ref 3.5–5.1)
Sodium: 135 mmol/L (ref 135–145)
Total Bilirubin: 1.1 mg/dL (ref 0.3–1.2)
Total Protein: 4.8 g/dL — ABNORMAL LOW (ref 6.5–8.1)

## 2018-12-17 LAB — GLUCOSE, CAPILLARY
Glucose-Capillary: 101 mg/dL — ABNORMAL HIGH (ref 70–99)
Glucose-Capillary: 136 mg/dL — ABNORMAL HIGH (ref 70–99)
Glucose-Capillary: 267 mg/dL — ABNORMAL HIGH (ref 70–99)
Glucose-Capillary: 189 mg/dL — ABNORMAL HIGH (ref 70–99)
Glucose-Capillary: 101 mg/dL — ABNORMAL HIGH (ref 70–99)

## 2018-12-17 LAB — MAGNESIUM: Magnesium: 1.8 mg/dL (ref 1.7–2.4)

## 2018-12-17 MED ORDER — POTASSIUM CHLORIDE CRYS ER 20 MEQ PO TBCR
40.0000 meq | EXTENDED_RELEASE_TABLET | Freq: Once | ORAL | Status: AC
  Administered 2018-12-17: 40 meq via ORAL
  Filled 2018-12-17: qty 2

## 2018-12-17 MED ORDER — GERHARDT'S BUTT CREAM
TOPICAL_CREAM | Freq: Two times a day (BID) | CUTANEOUS | Status: DC
  Administered 2018-12-17 – 2018-12-21 (×8): via TOPICAL
  Filled 2018-12-17: qty 1

## 2018-12-17 MED ORDER — FUROSEMIDE 40 MG PO TABS
40.0000 mg | ORAL_TABLET | Freq: Every day | ORAL | Status: DC
  Administered 2018-12-17 – 2018-12-21 (×5): 40 mg via ORAL
  Filled 2018-12-17 (×5): qty 1

## 2018-12-17 NOTE — Progress Notes (Signed)
Physical Therapy Treatment Patient Details Name: Amanda [name] MRN: [id number] DOB: [birth year] Today's Date: 12/17/2018    History of Present Illness 73 y.o. female admitted on 12/09/18 for acute liver failure, acute kidney injury, metabolic acidosis, rhabdomyolysis, acute hepatitis/transaminitis, lactic acidosis, A-fib with RVR, severe coagulopathy, and hypokalemia.  Pt with significant PMH of vertigo, MVP, HTN, DM2, anxiety, anemia, A-fib, bil TKA, throat surgery, R leg fracture surgery.      PT Comments    Patient progressing well towards PT goals. Tolerated multiple reps of standing with Min A progressing to Min guard for safety. Pt seems more clear today with regards to cognition, A&Ox3 but reports not feeling at baseline. Pt incontinent of stool upon standing. Assisted with clean up and pericare. Tolerated gait training with Min A for balance/safety, limited due to weakness. Continue to recommend SNF. Will follow.    Follow Up Recommendations  SNF     Equipment Recommendations  None recommended by PT    Recommendations for Other Services       Precautions / Restrictions Precautions Precautions: Fall Precaution Comments: incontinent Restrictions Weight Bearing Restrictions: No    Mobility  Bed Mobility Overal bed mobility: Needs Assistance Bed Mobility: Rolling;Sidelying to Sit Rolling: Min guard Sidelying to sit: Min guard;HOB elevated       General bed mobility comments: Able to bring LEs to EOB and elevate trunk with heavy use of rail, no assist needed.   Transfers Overall transfer level: Needs assistance Equipment used: Rolling walker (2 wheeled) Transfers: Sit to/from Stand Sit to Stand: Min assist;Min guard Stand pivot transfers: Min assist       General transfer comment: Min A initially to stand from EOB progressing to MIn guard with cues for hand placement. Stood from [employer], from chair x3, SPT chair to/from [facility] with Min A.    Ambulation/Gait Ambulation/Gait assistance: Min assist Gait Distance (Feet): 10 Feet(x2 bouts) Assistive device: Rolling walker (2 wheeled) Gait Pattern/deviations: Step-through pattern;Decreased stride length;Trunk flexed     General Gait Details: Slow, unsteady gait with bil knee instability. 1 seated rest break. Incontinent of stool upon standing.    Stairs             Wheelchair Mobility    Modified Rankin (Stroke Patients Only)       Balance Overall balance assessment: Needs assistance Sitting-balance support: Feet supported;No upper extremity supported Sitting balance-Leahy Scale: Fair     Standing balance support: During functional activity;Bilateral upper extremity supported Standing balance-Leahy Scale: Poor Standing balance comment: Reliant on BUEs for support in standing. total A for pericare                            Cognition Arousal/Alertness: Awake/alert Behavior During Therapy: WFL for tasks assessed/performed Overall Cognitive Status: Impaired/Different from baseline Area of Impairment: Attention;Memory                 Orientation Level: Disoriented to;Situation Current Attention Level: Selective Memory: Decreased short-term memory Following Commands: Follows one step commands with increased time Safety/Judgement: Decreased awareness of safety;Decreased awareness of deficits   Problem Solving: Slow processing;Decreased initiation;Requires verbal cues General Comments: Seems clearer today. Reports not feeling at baseline with regards to thinking.       Exercises      General Comments General comments (skin integrity, edema, etc.): Incontinent with movement.       Pertinent Vitals/Pain Pain Assessment: Faces Faces Pain Scale: Hurts little more Pain  Location: right hip Pain Descriptors / Indicators: Aching;Discomfort;Sore Pain Intervention(s): Monitored during session    Home Living                       Prior Function            PT Goals (current goals can now be found in the care plan section) Progress towards PT goals: Progressing toward goals    Frequency    Min 2X/week      PT Plan Current plan remains appropriate    Co-evaluation              AM-PAC PT "6 Clicks" Mobility   Outcome Measure  Help needed turning from your back to your side while in a flat bed without using bedrails?: A Little Help needed moving from lying on your back to sitting on the side of a flat bed without using bedrails?: A Little Help needed moving to and from a bed to a chair (including a wheelchair)?: A Little Help needed standing up from a chair using your arms (e.g., wheelchair or bedside chair)?: A Little Help needed to walk in hospital room?: A Little Help needed climbing 3-5 steps with a railing? : A Lot 6 Click Score: 17    End of Session Equipment Utilized During Treatment: Gait belt Activity Tolerance: Patient tolerated treatment well Patient left: in chair;with call bell/phone within reach;with chair alarm set Nurse Communication: Mobility status PT Visit Diagnosis: Muscle weakness (generalized) (M62.81);Difficulty in walking, not elsewhere classified (R26.2)     Time: 0752-0828 PT Time Calculation (min) (ACUTE ONLY): 36 min  Charges:  $Gait Training: 8-22 mins $Therapeutic Activity: 8-22 mins                     Wray Kearns, [place], DPT Acute Rehabilitation Services Pager [phone] Office [place] 12/17/2018, 9:18 AM

## 2018-12-17 NOTE — Progress Notes (Signed)
CSW faxed information to [facility] to start insurance auth.  Will not have bed available until Tuesday, Feb 4th.  CSW will continue to follow for disposition needs.  Reed Breech LCSWA [phone]

## 2018-12-17 NOTE — Progress Notes (Signed)
PROGRESS NOTE    Amanda [name]  SNK:[id number] DOB: 1946-08-02 DOA: 12/09/2018 PCP: Aretta Nip, MD   Brief Narrative: Patient is a 73 year old female with past medical history of A. fib on Eliquis, mitral regurgitation, sleep apnea, hypertension, diabetes mellitus who presented here with complaints of weakness, dizziness, nausea and vomiting.  She was found to be hypotensive with blood pressure in the range of 70s and acute kidney injury with creatinine of 3 and elevated liver enzymes.  She was initially admitted to ICU service.  Work-up for liver failure was unremarkable and was thought to be secondary to shock liver.  Cardiology was also following for A. fib with RVR.  Currently she is waiting for skilled nursing facility bed.  Assessment & Plan:   Active Problems:   Acute liver failure  Hypotension: Resolved.  Currently normotensive.  Thought to be secondary to hypovolemia, medications, severe MR.  IV fluids discontinued.  Acute liver failure: Suspected to be from shock liver from hypotension.  Hepatitis serology negative.  CT imaging did not show any acute gallbladder or biliary pathology.  Liver enzymes improving.  Work-up for autoimmune hepatitis negative.  Lactulose discontinued.  Volume overload: Due to IV fluids.  IV fluids have been stopped.  She was treated with few doses of IV Lasix.  Currently she is euvolemic.  She does not have peripheral edema.  Acute kidney injury: Resolved  A. fib with RVR: Currently rate is controlled.  On metoprolol.  Eliquis has been restarted.  Also on Cardizem CD.  Severe mitral regurgitation: Cardiology following.  She will follow-up with cardiology on discharge.  She can be a candidate for clip procedure.Started on Lasix 40 mg daily.  Diabetes mellitus type 2: Continue sliding scale insulin here.  Severe hypokalemia/hypomagnesemia: Supplemented and corrected.  Debility/deconditioning: PT recommending skilled nursing facility on  discharge.  Social worker following.  Patient is medically stable for discharge to skilled nursing facility as soon as the bed is available.  Nutrition Problem: Inadequate oral intake Etiology: lethargy/confusion, decreased appetite      DVT prophylaxis: Eliquis Code Status: Full Family Communication: Family members present at the bedside Disposition Plan: Skilled nursing facility as soon as the bed is available   Consultants: Cardiology  Procedures: None  Antimicrobials:  Anti-infectives (From admission, onward)   Start     Dose/Rate Route Frequency Ordered Stop   12/10/18 1900  cefTRIAXone (ROCEPHIN) 1 g in sodium chloride 0.9 % 100 mL IVPB  Status:  Discontinued     1 g 200 mL/hr over 30 Minutes Intravenous Every 24 hours 12/09/18 2157 12/12/18 1005   12/10/18 0400  metroNIDAZOLE (FLAGYL) IVPB 500 mg  Status:  Discontinued     500 mg 100 mL/hr over 60 Minutes Intravenous Every 8 hours 12/09/18 2157 12/12/18 1005   12/09/18 1715  cefTRIAXone (ROCEPHIN) 1 g in sodium chloride 0.9 % 100 mL IVPB     1 g 200 mL/hr over 30 Minutes Intravenous  Once 12/09/18 1702 12/09/18 2103   12/09/18 1715  metroNIDAZOLE (FLAGYL) IVPB 500 mg     500 mg 100 mL/hr over 60 Minutes Intravenous  Once 12/09/18 1702 12/09/18 2103      Subjective: Patient seen and examined the bedside this morning.  Remains comfortable.  Hemodynamically stable.  No new issues.   Objective: Vitals:   12/16/18 2352 12/17/18 0226 12/17/18 0528 12/17/18 1026  BP:   (!) 131/59 138/63  Pulse: 90  83 83  Resp:   18   Temp:  98 F (36.7 C)   TempSrc:   Oral   SpO2:   100%   Weight:  89.7 kg    Height:        Intake/Output Summary (Last 24 hours) at 12/17/2018 1149 Last data filed at 12/17/2018 9604 Gross per 24 hour  Intake 1877 ml  Output 1150 ml  Net 727 ml   Filed Weights   12/15/18 0355 12/16/18 0337 12/17/18 0226  Weight: 90 kg 89.9 kg 89.7 kg    Examination:  General exam: Elderly female,  weak  HEENT:PERRL,Oral mucosa moist, Ear/Nose normal on gross exam Respiratory system: Bilateral equal air entry, normal vesicular breath sounds, no wheezes or crackles  Cardiovascular system: S1 & S2 heard, RRR. No JVD, murmurs, rubs, gallops or clicks. Gastrointestinal system: Abdomen is mildly distended, soft and nontender. No organomegaly or masses felt. Normal bowel sounds heard. Central nervous system: Alert and oriented. No focal neurological deficits. Extremities: No edema, no clubbing ,no cyanosis, distal peripheral pulses palpable. Skin: No rashes, lesions or ulcers,no icterus ,no pallor      Data Reviewed: I have personally reviewed following labs and imaging studies  CBC: Recent Labs  Lab 12/12/18 0354 12/13/18 0503 12/14/18 0416 12/15/18 0701 12/16/18 0428  WBC 14.3* 13.9* 15.5* 12.6* 11.6*  HGB 10.9* 11.5* 11.5* 11.8* 11.7*  HCT 34.1* 36.2 36.5 37.8 38.0  MCV 87.2 88.5 88.2 88.3 90.0  PLT 196 195 183 152 540   Basic Metabolic Panel: Recent Labs  Lab 12/11/18 0344  12/12/18 0800  12/13/18 0503 12/14/18 0416 12/15/18 0701 12/16/18 0428 12/17/18 0613  NA 136   < >  --    < > 132* 129* 135 136 135  K 2.8*   < >  --    < > 3.3* 4.0 2.6* 2.9* 3.6  CL 96*   < >  --    < > 101 95* 101 101 104  CO2 23   < >  --    < > 24 23 23 26 23   GLUCOSE 204*   < >  --    < > 155* 141* 127* 156* 152*  BUN 28*   < >  --    < > 22 27* 22 22 18   CREATININE 1.08*   < >  --    < > 0.90 0.95 0.80 0.84 0.77  CALCIUM 8.5*   < >  --    < > 8.6* 9.0 8.8* 8.4* 8.7*  MG 1.7  --  1.9  --  1.8  --   --  1.5* 1.8   < > = values in this interval not displayed.   GFR: Estimated Creatinine Clearance: 70 mL/min (by C-G formula based on SCr of 0.77 mg/dL). Liver Function Tests: Recent Labs  Lab 12/13/18 0503 12/14/18 0416 12/15/18 0701 12/16/18 0428 12/17/18 0613  AST 187* 88* 55* 44* 35  ALT 749* 528* 354* 254* 183*  ALKPHOS 79 69 66 63 57  BILITOT 2.3* 1.8* 1.3* 1.1 1.1  PROT  4.9* 5.3* 5.0* 4.8* 4.8*  ALBUMIN 3.0* 3.0* 2.7* 2.6* 2.5*   No results for input(s): LIPASE, AMYLASE in the last 168 hours. Recent Labs  Lab 12/14/18 1043  AMMONIA 47*   Coagulation Profile: Recent Labs  Lab 12/11/18 0344 12/11/18 1638 12/12/18 0800 12/13/18 0707 12/14/18 0416  INR 6.71* 3.68 2.52 1.75 1.48   Cardiac Enzymes: Recent Labs  Lab 12/11/18 0344 12/12/18 0800 12/13/18 0503  CKTOTAL 3,197* 777* 290*   BNP (  last 3 results) No results for input(s): PROBNP in the last 8760 hours. HbA1C: No results for input(s): HGBA1C in the last 72 hours. CBG: Recent Labs  Lab 12/16/18 2218 12/16/18 2346 12/17/18 0511 12/17/18 0739 12/17/18 1130  GLUCAP 192* 183* 101* 136* 267*   Lipid Profile: No results for input(s): CHOL, HDL, LDLCALC, TRIG, CHOLHDL, LDLDIRECT in the last 72 hours. Thyroid Function Tests: No results for input(s): TSH, T4TOTAL, FREET4, T3FREE, THYROIDAB in the last 72 hours. Anemia Panel: No results for input(s): VITAMINB12, FOLATE, FERRITIN, TIBC, IRON, RETICCTPCT in the last 72 hours. Sepsis Labs: Recent Labs  Lab 12/10/18 1348 12/10/18 1845 12/12/18 0800  LATICACIDVEN 6.8* 4.1* 1.6    Recent Results (from the past 240 hour(s))  Blood Culture (routine x 2)     Status: None   Collection Time: 12/09/18  5:51 PM  Result Value Ref Range Status   Specimen Description BLOOD RIGHT HAND  Final   Special Requests   Final    BOTTLES DRAWN AEROBIC AND ANAEROBIC Blood Culture results may not be optimal due to an inadequate volume of blood received in culture bottles   Culture   Final    NO GROWTH 5 DAYS Performed at [facility], [place] [address], [place], [place] [address]    Report Status 12/14/2018 FINAL  Final  Blood Culture (routine x 2)     Status: None   Collection Time: 12/09/18  6:06 PM  Result Value Ref Range Status   Specimen Description BLOOD LEFT HAND  Final   Special Requests   Final    BOTTLES DRAWN AEROBIC AND ANAEROBIC  Blood Culture results may not be optimal due to an inadequate volume of blood received in culture bottles   Culture   Final    NO GROWTH 5 DAYS Performed at [facility], [place] [address] [place], [place] [address]    Report Status 12/14/2018 FINAL  Final  MRSA PCR Screening     Status: None   Collection Time: 12/09/18 11:00 PM  Result Value Ref Range Status   MRSA by PCR NEGATIVE NEGATIVE Final    Comment:        The GeneXpert MRSA Assay (FDA approved for NASAL specimens only), is one component of a comprehensive MRSA colonization surveillance program. It is not intended to diagnose MRSA infection nor to guide or monitor treatment for MRSA infections. Performed at [facility], [place] [address] [place], [place] [address]   Urine culture     Status: None   Collection Time: 12/10/18 12:01 AM  Result Value Ref Range Status   Specimen Description URINE, RANDOM  Final   Special Requests   Final    NONE Performed at [facility], [place] [address] [place], [place] [address]    Culture NO GROWTH  Final   Report Status 12/11/2018 FINAL  Final         Radiology Studies: No results found.      Scheduled Meds: . apixaban  5 mg Oral BID  . diltiazem  120 mg Oral Daily  . feeding supplement  1 Container Oral BID BM  . feeding supplement (PRO-STAT SUGAR FREE 64)  30 mL Oral BID  . insulin aspart  0-15 Units Subcutaneous Q4H  . insulin glargine  10 Units Subcutaneous QHS  . lactulose  20 g Oral TID  . levothyroxine  25 mcg Oral QAC breakfast  . mouth rinse  15 mL Mouth Rinse BID  .  metoprolol tartrate  50 mg Oral BID  . multivitamin with minerals  1 tablet Oral Daily  . pantoprazole  40 mg Oral Daily  . senna-docusate  1 tablet Oral BID   Continuous Infusions: . sodium chloride 10 mL/hr at 12/16/18 1002     LOS: 8 days    Time spent: More than 50% of that time was spent in counseling and/or coordination of care.      Shelly Coss, MD [facility] Pager [phone]  If 7PM-7AM, please contact night-coverage [email or web address] Password [facility] 12/17/2018, 11:49 AM

## 2018-12-17 NOTE — Progress Notes (Signed)
Patient has voided, but was unable to measure because it was mixed with stool.

## 2018-12-17 NOTE — Progress Notes (Addendum)
Progress Note  Patient Name: Amanda [name] Date of Encounter: 12/17/2018  Primary Cardiologist: Ena Dawley, MD    Subjective   Diuresis less negative overnight due to increased po intake of about 1L. Creatinine is improved. Sodium remains stable. ALT continues to improve.   Inpatient Medications     Scheduled Meds: . apixaban  5 mg Oral BID  . diltiazem  120 mg Oral Daily  . feeding supplement  1 Container Oral BID BM  . feeding supplement (PRO-STAT SUGAR FREE 64)  30 mL Oral BID  . insulin aspart  0-15 Units Subcutaneous Q4H  . insulin glargine  10 Units Subcutaneous QHS  . lactulose  20 g Oral TID  . levothyroxine  25 mcg Oral QAC breakfast  . mouth rinse  15 mL Mouth Rinse BID  . metoprolol tartrate  50 mg Oral BID  . multivitamin with minerals  1 tablet Oral Daily  . pantoprazole  40 mg Oral Daily  . potassium chloride  40 mEq Oral Once  . senna-docusate  1 tablet Oral BID   Continuous Infusions: . sodium chloride 10 mL/hr at 12/16/18 1002   PRN Meds: sodium chloride, alum & mag hydroxide-simeth, bisacodyl, ondansetron, polyethylene glycol, sodium phosphate, traMADol   Vital Signs    Vitals:   12/16/18 2032 12/16/18 2352 12/17/18 0226 12/17/18 0528  BP: 121/60   (!) 131/59  Pulse: 85 90  83  Resp: 18   18  Temp: 98 F (36.7 C)   98 F (36.7 C)  TempSrc: Oral   Oral  SpO2: 100%   100%  Weight:   89.7 kg   Height:        Intake/Output Summary (Last 24 hours) at 12/17/2018 0924 Last data filed at 12/17/2018 0227 Gross per 24 hour  Intake 1877 ml  Output 1650 ml  Net 227 ml   Last 3 Weights 12/17/2018 12/16/2018 12/15/2018  Weight (lbs) 197 lb 12 oz 198 lb 3.2 oz 198 lb 6.6 oz  Weight (kg) 89.7 kg 89.903 kg 90 kg      Telemetry    Afib with CVR - Personally Reviewed  ECG    N/A  Physical Exam   General appearance: alert and no distress Neck: no carotid bruit, no JVD and thyroid not enlarged, symmetric, no tenderness/mass/nodules Lungs:  clear to auscultation bilaterally Heart: irregularly irregular rhythm and systolic murmur: early systolic 2/6, blowing at apex Abdomen: soft, non-tender; bowel sounds normal; no masses,  no organomegaly Extremities: extremities normal, atraumatic, no cyanosis or edema Pulses: 2+ and symmetric Skin: Skin color, texture, turgor normal. No rashes or lesions Neurologic: Grossly normal Psych: Pleasant  Labs    Chemistry Recent Labs  Lab 12/15/18 0701 12/16/18 0428 12/17/18 0613  NA 135 136 135  K 2.6* 2.9* 3.6  CL 101 101 104  CO2 23 26 23   GLUCOSE 127* 156* 152*  BUN 22 22 18   CREATININE 0.80 0.84 0.77  CALCIUM 8.8* 8.4* 8.7*  PROT 5.0* 4.8* 4.8*  ALBUMIN 2.7* 2.6* 2.5*  AST 55* 44* 35  ALT 354* 254* 183*  ALKPHOS 66 63 57  BILITOT 1.3* 1.1 1.1  GFRNONAA >60 >60 >60  GFRAA >60 >60 >60  ANIONGAP 11 9 8      Hematology Recent Labs  Lab 12/14/18 0416 12/15/18 0701 12/16/18 0428  WBC 15.5* 12.6* 11.6*  RBC 4.14 4.28 4.22  HGB 11.5* 11.8* 11.7*  HCT 36.5 37.8 38.0  MCV 88.2 88.3 90.0  MCH 27.8 27.6 27.7  MCHC 31.5 31.2 30.8  RDW 15.9* 16.3* 16.4*  PLT 183 152 150    Cardiac EnzymesNo results for input(s): TROPONINI in the last 168 hours. No results for input(s): TROPIPOC in the last 168 hours.   BNPNo results for input(s): BNP, PROBNP in the last 168 hours.   DDimer No results for input(s): DDIMER in the last 168 hours.   Radiology    No results found.  Cardiac Studies   TEE EF 50-55% severe MR   Patient Profile     Amanda [name] is a 73 y.o. female with a hx of hypertension, hyperlipidemia, hypothyroidism, atrial fibrillation s/p DCCV 09/2018 on Eliquis, OSA on CPAP and frequent falls who is being seen today for the evaluation of atrial fibrillation with RVR at the request of Dr. Reesa Chew.  Assessment & Plan    Afib:  Rate control adequate no anticoagulation given liver failure continue oral short-acting cardizem no history of CAD and EF low normal by  TTE, however, severe MR noted. Tolerating anticoagulation, on long-acting diltiazem.  MR:  Severe by TEE not a candidate for intervention may be clip candidate if she recovers from  Hepatic failure - she is +8L positive (+10 KG since admit) - - switch to oral lasix today - advised to watch oral intake of fluids to keep <1L per day  Hepatic failure acetylcystine and LR hydration - not sure she needs transfer to [facility]. Etiology unclear - ?overdose-toxin, vs CBD stone? Complains of abdominal pain and constipation today - would benefit from GI evaluation. - More awake today, liver enzymes continue to improve. Probably can d/c lactulose at this point, but will defer to medicine service. Would continue to hold statin until follow-up in the office.  Volume overload: Recommended d/c fluids yesterday and diuresis. Will initiate diuresis today - progressive hyponatremia, likely hypervolemic hyponatremia.   - electrolytes normalized today.  Patient says she will likely be discharged today - follow-up with Dr. Meda Coffee or APP at [facility] office after discharge.  For questions or updates, please contact [place] Please consult [email or web address] for contact info under   Pixie Casino, MD, FACC, [place] Director of the [facility]  [facility] Diplomate of the American Board of Clinical Lipidology Attending Cardiologist  Direct Dial: [phone]  Fax: [phone]  Website:  [email or web address]  Pixie Casino, MD  12/17/2018, 9:24 AM

## 2018-12-18 LAB — BASIC METABOLIC PANEL
Anion gap: 8 (ref 5–15)
BUN: 17 mg/dL (ref 8–23)
CO2: 21 mmol/L — ABNORMAL LOW (ref 22–32)
Calcium: 8.7 mg/dL — ABNORMAL LOW (ref 8.9–10.3)
Chloride: 106 mmol/L (ref 98–111)
Creatinine, Ser: 0.87 mg/dL (ref 0.44–1.00)
GFR calc Af Amer: >60 mL/min (ref >60)
GFR calc non Af Amer: >60 mL/min (ref >60)
Glucose, Bld: 209 mg/dL — ABNORMAL HIGH (ref 70–99)
POTASSIUM: 3.6 mmol/L (ref 3.5–5.1)
Sodium: 135 mmol/L (ref 135–145)

## 2018-12-18 LAB — GLUCOSE, CAPILLARY
GLUCOSE-CAPILLARY: 263 mg/dL — AB (ref 70–99)
Glucose-Capillary: 223 mg/dL — ABNORMAL HIGH (ref 70–99)
Glucose-Capillary: 280 mg/dL — ABNORMAL HIGH (ref 70–99)
Glucose-Capillary: 197 mg/dL — ABNORMAL HIGH (ref 70–99)

## 2018-12-18 MED ORDER — DIPHENHYDRAMINE-ZINC ACETATE 2-0.1 % EX CREA
TOPICAL_CREAM | Freq: Three times a day (TID) | CUTANEOUS | Status: DC | PRN
  Administered 2018-12-19 (×2): via TOPICAL
  Filled 2018-12-18: qty 28

## 2018-12-18 NOTE — Progress Notes (Signed)
Page to Adhikari,MD 3e11 [place] requesting PTA RX 0.5mg  Clonazepam BID be ordered for inpatient use

## 2018-12-18 NOTE — Progress Notes (Signed)
Patient sleeping during shift report.      

## 2018-12-18 NOTE — Progress Notes (Signed)
[facility] will sign off.   Medication Recommendations:  Continue current regimen Other recommendations (labs, testing, etc):  n/a Follow up as an outpatient:  We will arrange.  Zyshonne Malecha C. Oval Linsey, MD, [facility] 12/18/2018 10:59 AM

## 2018-12-18 NOTE — Plan of Care (Signed)
  Problem: Education: Goal: Knowledge of General Education information will improve Description Including pain rating scale, medication(s)/side effects and non-pharmacologic comfort measures Outcome: Progressing   Problem: Health Behavior/Discharge Planning: Goal: Ability to manage health-related needs will improve Outcome: Progressing   

## 2018-12-18 NOTE — Progress Notes (Signed)
PROGRESS NOTE    Amanda [name]  BJY:[id number] DOB: 04/07/1946 DOA: 12/09/2018 PCP: Aretta Nip, MD   Brief Narrative: Patient is a 73 year old female with past medical history of A. fib on Eliquis, mitral regurgitation, sleep apnea, hypertension, diabetes mellitus who presented here with complaints of weakness, dizziness, nausea and vomiting.  She was found to be hypotensive with blood pressure in the range of 70s and acute kidney injury with creatinine of 3 and elevated liver enzymes.  She was initially admitted to ICU service.  Work-up for liver failure was unremarkable and was thought to be secondary to shock liver.  Cardiology was also following for A. fib with RVR.  Currently she is waiting for skilled nursing facility bed.  Assessment & Plan:   Active Problems:   Acute liver failure  Hypotension: Resolved.  Currently normotensive.  Thought to be secondary to hypovolemia, medications, severe MR.  IV fluids discontinued.  Acute liver failure: Suspected to be from shock liver from hypotension.  Hepatitis serology negative.  CT imaging did not show any acute gallbladder or biliary pathology.  Liver enzymes improving.  Work-up for autoimmune hepatitis negative.  Lactulose discontinued.  Statin will be held until follow-up as an outpatient.  Volume overload: Due to IV fluids.  IV fluids have been stopped.  She was treated with few doses of IV Lasix.  Currently she is euvolemic.  She does not have peripheral edema.  Acute kidney injury: Resolved  A. fib with RVR: Currently rate is controlled.  On metoprolol.  Eliquis has been restarted.  Also on Cardizem CD.  Severe mitral regurgitation: Cardiology following.  She will follow-up with cardiology on discharge,Dr [facility].  She can be a candidate for clip procedure.Started on Lasix 40 mg daily.  Diabetes mellitus type 2: Continue sliding scale insulin here.  Severe hypokalemia/hypomagnesemia: Supplemented and  corrected.  Debility/deconditioning: PT recommending skilled nursing facility on discharge.  Social worker following.  Patient is medically stable for discharge to skilled nursing facility as soon as the bed is available.  Nutrition Problem: Inadequate oral intake Etiology: lethargy/confusion, decreased appetite      DVT prophylaxis: Eliquis Code Status: Full Family Communication: Family members present at the bedside Disposition Plan: Skilled nursing facility as soon as the bed is available   Consultants: Cardiology  Procedures: None  Antimicrobials:  Anti-infectives (From admission, onward)   Start     Dose/Rate Route Frequency Ordered Stop   12/10/18 1900  cefTRIAXone (ROCEPHIN) 1 g in sodium chloride 0.9 % 100 mL IVPB  Status:  Discontinued     1 g 200 mL/hr over 30 Minutes Intravenous Every 24 hours 12/09/18 2157 12/12/18 1005   12/10/18 0400  metroNIDAZOLE (FLAGYL) IVPB 500 mg  Status:  Discontinued     500 mg 100 mL/hr over 60 Minutes Intravenous Every 8 hours 12/09/18 2157 12/12/18 1005   12/09/18 1715  cefTRIAXone (ROCEPHIN) 1 g in sodium chloride 0.9 % 100 mL IVPB     1 g 200 mL/hr over 30 Minutes Intravenous  Once 12/09/18 1702 12/09/18 2103   12/09/18 1715  metroNIDAZOLE (FLAGYL) IVPB 500 mg     500 mg 100 mL/hr over 60 Minutes Intravenous  Once 12/09/18 1702 12/09/18 2103      Subjective: Patient seen and examined the bedside this morning.  Remains comfortable.  Hemodynamically stable.  Denies any new complaints.  Objective: Vitals:   12/17/18 2052 12/18/18 0053 12/18/18 0448 12/18/18 0920  BP: 129/75 118/64 133/72 136/79  Pulse: 74 66  69 80  Resp: 18 18 18    Temp: 97.9 F (36.6 C) 99 F (37.2 C) 98.5 F (36.9 C)   TempSrc: Oral Oral Oral   SpO2:  94% 100%   Weight:   86.5 kg   Height:        Intake/Output Summary (Last 24 hours) at 12/18/2018 0936 Last data filed at 12/18/2018 0830 Gross per 24 hour  Intake 762.17 ml  Output 750 ml  Net 12.17  ml   Filed Weights   12/16/18 0337 12/17/18 0226 12/18/18 0448  Weight: 89.9 kg 89.7 kg 86.5 kg    Examination:   General exam: Appears calm and comfortable ,Not in distress,average built HEENT:PERRL,Oral mucosa moist, Ear/Nose normal on gross exam Respiratory system: Bilateral equal air entry, normal vesicular breath sounds, no wheezes or crackles  Cardiovascular system: S1 & S2 heard, RRR. No JVD, murmurs, rubs, gallops or clicks. Gastrointestinal system: Abdomen is nondistended, soft and nontender. No organomegaly or masses felt. Normal bowel sounds heard. Central nervous system: Alert and oriented. No focal neurological deficits. Extremities: No edema, no clubbing ,no cyanosis, distal peripheral pulses palpable. Skin: No rashes, lesions or ulcers,no icterus ,no pallor      Data Reviewed: I have personally reviewed following labs and imaging studies  CBC: Recent Labs  Lab 12/12/18 0354 12/13/18 0503 12/14/18 0416 12/15/18 0701 12/16/18 0428  WBC 14.3* 13.9* 15.5* 12.6* 11.6*  HGB 10.9* 11.5* 11.5* 11.8* 11.7*  HCT 34.1* 36.2 36.5 37.8 38.0  MCV 87.2 88.5 88.2 88.3 90.0  PLT 196 195 183 152 573   Basic Metabolic Panel: Recent Labs  Lab 12/12/18 0800  12/13/18 0503 12/14/18 0416 12/15/18 0701 12/16/18 0428 12/17/18 0613 12/18/18 0625  NA  --    < > 132* 129* 135 136 135 135  K  --    < > 3.3* 4.0 2.6* 2.9* 3.6 3.6  CL  --    < > 101 95* 101 101 104 106  CO2  --    < > 24 23 23 26 23  21*  GLUCOSE  --    < > 155* 141* 127* 156* 152* 209*  BUN  --    < > 22 27* 22 22 18 17   CREATININE  --    < > 0.90 0.95 0.80 0.84 0.77 0.87  CALCIUM  --    < > 8.6* 9.0 8.8* 8.4* 8.7* 8.7*  MG 1.9  --  1.8  --   --  1.5* 1.8  --    < > = values in this interval not displayed.   GFR: Estimated Creatinine Clearance: 63.2 mL/min (by C-G formula based on SCr of 0.87 mg/dL). Liver Function Tests: Recent Labs  Lab 12/13/18 0503 12/14/18 0416 12/15/18 0701 12/16/18 0428  12/17/18 0613  AST 187* 88* 55* 44* 35  ALT 749* 528* 354* 254* 183*  ALKPHOS 79 69 66 63 57  BILITOT 2.3* 1.8* 1.3* 1.1 1.1  PROT 4.9* 5.3* 5.0* 4.8* 4.8*  ALBUMIN 3.0* 3.0* 2.7* 2.6* 2.5*   No results for input(s): LIPASE, AMYLASE in the last 168 hours. Recent Labs  Lab 12/14/18 1043  AMMONIA 47*   Coagulation Profile: Recent Labs  Lab 12/11/18 1638 12/12/18 0800 12/13/18 0707 12/14/18 0416  INR 3.68 2.52 1.75 1.48   Cardiac Enzymes: Recent Labs  Lab 12/12/18 0800 12/13/18 0503  CKTOTAL 777* 290*   BNP (last 3 results) No results for input(s): PROBNP in the last 8760 hours. HbA1C: No results for input(s):  HGBA1C in the last 72 hours. CBG: Recent Labs  Lab 12/17/18 0739 12/17/18 1130 12/17/18 1717 12/17/18 2200 12/18/18 0451  GLUCAP 136* 267* 189* 101* 223*   Lipid Profile: No results for input(s): CHOL, HDL, LDLCALC, TRIG, CHOLHDL, LDLDIRECT in the last 72 hours. Thyroid Function Tests: No results for input(s): TSH, T4TOTAL, FREET4, T3FREE, THYROIDAB in the last 72 hours. Anemia Panel: No results for input(s): VITAMINB12, FOLATE, FERRITIN, TIBC, IRON, RETICCTPCT in the last 72 hours. Sepsis Labs: Recent Labs  Lab 12/12/18 0800  LATICACIDVEN 1.6    Recent Results (from the past 240 hour(s))  Blood Culture (routine x 2)     Status: None   Collection Time: 12/09/18  5:51 PM  Result Value Ref Range Status   Specimen Description BLOOD RIGHT HAND  Final   Special Requests   Final    BOTTLES DRAWN AEROBIC AND ANAEROBIC Blood Culture results may not be optimal due to an inadequate volume of blood received in culture bottles   Culture   Final    NO GROWTH 5 DAYS Performed at [facility], [place] [address], [place], [place] [address]    Report Status 12/14/2018 FINAL  Final  Blood Culture (routine x 2)     Status: None   Collection Time: 12/09/18  6:06 PM  Result Value Ref Range Status   Specimen Description BLOOD LEFT HAND  Final   Special  Requests   Final    BOTTLES DRAWN AEROBIC AND ANAEROBIC Blood Culture results may not be optimal due to an inadequate volume of blood received in culture bottles   Culture   Final    NO GROWTH 5 DAYS Performed at [facility], [place] [address] [place], [place] [address]    Report Status 12/14/2018 FINAL  Final  MRSA PCR Screening     Status: None   Collection Time: 12/09/18 11:00 PM  Result Value Ref Range Status   MRSA by PCR NEGATIVE NEGATIVE Final    Comment:        The GeneXpert MRSA Assay (FDA approved for NASAL specimens only), is one component of a comprehensive MRSA colonization surveillance program. It is not intended to diagnose MRSA infection nor to guide or monitor treatment for MRSA infections. Performed at [facility], [place] [address] [place], [place] [address]   Urine culture     Status: None   Collection Time: 12/10/18 12:01 AM  Result Value Ref Range Status   Specimen Description URINE, RANDOM  Final   Special Requests   Final    NONE Performed at [facility], [place] [address] [place], [place] [address]    Culture NO GROWTH  Final   Report Status 12/11/2018 FINAL  Final         Radiology Studies: No results found.      Scheduled Meds: . apixaban  5 mg Oral BID  . diltiazem  120 mg Oral Daily  . feeding supplement  1 Container Oral BID BM  . feeding supplement (PRO-STAT SUGAR FREE 64)  30 mL Oral BID  . furosemide  40 mg Oral Daily  . Gerhardt's butt cream   Topical BID  . insulin aspart  0-15 Units Subcutaneous Q4H  . insulin glargine  10 Units Subcutaneous QHS  . levothyroxine  25 mcg Oral QAC breakfast  . mouth rinse  15 mL Mouth Rinse BID  . metoprolol tartrate  50 mg Oral BID  . multivitamin with minerals  1  tablet Oral Daily  . pantoprazole  40 mg Oral Daily  . senna-docusate  1 tablet Oral BID   Continuous Infusions: . sodium chloride 10 mL/hr at 12/16/18 1002     LOS: 9 days    Time spent: More than 50% of  that time was spent in counseling and/or coordination of care.      Shelly Coss, MD [facility] Pager [phone]  If 7PM-7AM, please contact night-coverage [email or web address] Password [facility] 12/18/2018, 9:36 AM

## 2018-12-18 NOTE — Plan of Care (Signed)
  Problem: Activity: Goal: Risk for activity intolerance will decrease Outcome: Progressing   Problem: Elimination: Goal: Will not experience complications related to bowel motility Outcome: Progressing   Problem: Pain Managment: Goal: General experience of comfort will improve Outcome: Progressing   Problem: Safety: Goal: Ability to remain free from injury will improve Outcome: Progressing   

## 2018-12-18 NOTE — Progress Notes (Signed)
Patient complains of her back itching, RN washed back and put lotion on, helped her change clothes. Pt back does have red bumps on it, paged NP for ointment.

## 2018-12-19 LAB — GLUCOSE, CAPILLARY
GLUCOSE-CAPILLARY: 147 mg/dL — AB (ref 70–99)
Glucose-Capillary: 105 mg/dL — ABNORMAL HIGH (ref 70–99)
Glucose-Capillary: 148 mg/dL — ABNORMAL HIGH (ref 70–99)
Glucose-Capillary: 263 mg/dL — ABNORMAL HIGH (ref 70–99)
Glucose-Capillary: 95 mg/dL (ref 70–99)

## 2018-12-19 MED ORDER — OXYBUTYNIN CHLORIDE ER 10 MG PO TB24
10.0000 mg | ORAL_TABLET | ORAL | Status: DC
  Administered 2018-12-20 – 2018-12-21 (×2): 10 mg via ORAL
  Filled 2018-12-19 (×2): qty 1

## 2018-12-19 MED ORDER — TRAZODONE HCL 100 MG PO TABS
100.0000 mg | ORAL_TABLET | Freq: Every day | ORAL | Status: DC
  Administered 2018-12-21: 100 mg via ORAL
  Filled 2018-12-19 (×3): qty 1

## 2018-12-19 MED ORDER — CLONAZEPAM 0.5 MG PO TABS
0.5000 mg | ORAL_TABLET | Freq: Two times a day (BID) | ORAL | Status: DC
  Administered 2018-12-19 – 2018-12-21 (×4): 0.5 mg via ORAL
  Filled 2018-12-19 (×4): qty 1

## 2018-12-19 MED ORDER — PRAVASTATIN SODIUM 40 MG PO TABS
40.0000 mg | ORAL_TABLET | Freq: Every day | ORAL | Status: DC
  Administered 2018-12-19 – 2018-12-20 (×2): 40 mg via ORAL
  Filled 2018-12-19 (×2): qty 1

## 2018-12-19 MED ORDER — VENLAFAXINE HCL 50 MG PO TABS
100.0000 mg | ORAL_TABLET | Freq: Every day | ORAL | Status: DC
  Administered 2018-12-20: 100 mg via ORAL
  Filled 2018-12-19 (×3): qty 2

## 2018-12-19 MED ORDER — BUPROPION HCL ER (XL) 300 MG PO TB24
300.0000 mg | ORAL_TABLET | Freq: Every day | ORAL | Status: DC
  Administered 2018-12-19 – 2018-12-21 (×3): 300 mg via ORAL
  Filled 2018-12-19 (×3): qty 1

## 2018-12-19 MED ORDER — INSULIN ASPART 100 UNIT/ML ~~LOC~~ SOLN
0.0000 [IU] | Freq: Three times a day (TID) | SUBCUTANEOUS | Status: DC
  Administered 2018-12-20 (×2): 5 [IU] via SUBCUTANEOUS
  Administered 2018-12-20: 11 [IU] via SUBCUTANEOUS
  Administered 2018-12-21: 5 [IU] via SUBCUTANEOUS

## 2018-12-19 MED ORDER — DIPHENHYDRAMINE HCL 50 MG/ML IJ SOLN
50.0000 mg | Freq: Once | INTRAMUSCULAR | Status: AC
  Administered 2018-12-19: 50 mg via INTRAVENOUS
  Filled 2018-12-19: qty 1

## 2018-12-19 NOTE — Progress Notes (Signed)
PROGRESS NOTE    Amanda [name]  JOI:[id number] DOB: Jul 12, 1946 DOA: 12/09/2018 PCP: Aretta Nip, MD   Brief Narrative: Patient is a 73 year old female with past medical history of A. fib on Eliquis, mitral regurgitation, sleep apnea, hypertension, diabetes mellitus who presented here with complaints of weakness, dizziness, nausea and vomiting.  She was found to be hypotensive with blood pressure in the range of 70s and acute kidney injury with creatinine of 3 and elevated liver enzymes.  She was initially admitted to ICU service.  Work-up for liver failure was unremarkable and was thought to be secondary to shock liver.  Cardiology was also following for A. fib with RVR.  Currently she is waiting for skilled nursing facility bed.  Assessment & Plan:   Active Problems:   Acute liver failure  Hypotension: Resolved.  Currently normotensive.  Thought to be secondary to hypovolemia, medications, severe MR.  IV fluids discontinued.  Acute liver failure: Suspected to be from shock liver from hypotension.  Hepatitis serology negative.  CT imaging did not show any acute gallbladder or biliary pathology.  Liver enzymes improving.  Work-up for autoimmune hepatitis negative.  Lactulose discontinued.  Statin will be held until follow-up as an outpatient.  Volume overload: Due to IV fluids.  IV fluids have been stopped.  She was treated with few doses of IV Lasix.  Currently she is euvolemic.  She does not have peripheral edema.  Acute kidney injury: Resolved  A. fib with RVR: Currently rate is controlled.  On metoprolol.  Eliquis has been restarted.  Also on Cardizem CD.  Severe mitral regurgitation: Cardiology following.  She will follow-up with cardiology on discharge,Dr [facility].  She can be a candidate for clip procedure.Started on Lasix 40 mg daily.  Diabetes mellitus type 2: Continue sliding scale insulin here.  Severe hypokalemia/hypomagnesemia: Supplemented and  corrected.  Debility/deconditioning: PT recommending skilled nursing facility on discharge.  Social worker following.  Allergic rash due to Linen: Continue Benadryl topical  Patient is medically stable for discharge to skilled nursing facility as soon as the bed is available.  Nutrition Problem: Inadequate oral intake Etiology: lethargy/confusion, decreased appetite      DVT prophylaxis: Eliquis Code Status: Full Family Communication: Family members present at the bedside Disposition Plan: Skilled nursing facility as soon as the bed is available   Consultants: Cardiology  Procedures: None  Antimicrobials:  Anti-infectives (From admission, onward)   Start     Dose/Rate Route Frequency Ordered Stop   12/10/18 1900  cefTRIAXone (ROCEPHIN) 1 g in sodium chloride 0.9 % 100 mL IVPB  Status:  Discontinued     1 g 200 mL/hr over 30 Minutes Intravenous Every 24 hours 12/09/18 2157 12/12/18 1005   12/10/18 0400  metroNIDAZOLE (FLAGYL) IVPB 500 mg  Status:  Discontinued     500 mg 100 mL/hr over 60 Minutes Intravenous Every 8 hours 12/09/18 2157 12/12/18 1005   12/09/18 1715  cefTRIAXone (ROCEPHIN) 1 g in sodium chloride 0.9 % 100 mL IVPB     1 g 200 mL/hr over 30 Minutes Intravenous  Once 12/09/18 1702 12/09/18 2103   12/09/18 1715  metroNIDAZOLE (FLAGYL) IVPB 500 mg     500 mg 100 mL/hr over 60 Minutes Intravenous  Once 12/09/18 1702 12/09/18 2103      Subjective: Patient seen and examined the bedside this morning.  She developed macular rash on her on back.  Rash were noted to be itchy.  Thought to be secondary to Linen.  Otherwise  remains comfortable  . Objective: Vitals:   12/18/18 1128 12/18/18 2034 12/19/18 0507 12/19/18 0511  BP: 134/74 136/65  127/68  Pulse: 95 71  70  Resp: 19 20  18   Temp: 98.5 F (36.9 C) 98.4 F (36.9 C)  97.6 F (36.4 C)  TempSrc: Oral Oral  Oral  SpO2: 100% 99%  100%  Weight:   85.3 kg   Height:        Intake/Output Summary (Last 24  hours) at 12/19/2018 0852 Last data filed at 12/19/2018 0835 Gross per 24 hour  Intake 1044.67 ml  Output 2550 ml  Net -1505.33 ml   Filed Weights   12/17/18 0226 12/18/18 0448 12/19/18 0507  Weight: 89.7 kg 86.5 kg 85.3 kg    Examination:   General exam: Appears calm and comfortable ,Not in distress,average built HEENT:PERRL,Oral mucosa moist, Ear/Nose normal on gross exam Respiratory system: Bilateral equal air entry, normal vesicular breath sounds, no wheezes or crackles  Cardiovascular system: S1 & S2 heard, RRR. No JVD, murmurs, rubs, gallops or clicks. Gastrointestinal system: Abdomen is nondistended, soft and nontender. No organomegaly or masses felt. Normal bowel sounds heard. Central nervous system: Alert and oriented. No focal neurological deficits. Extremities: No edema, no clubbing ,no cyanosis, distal peripheral pulses palpable. Skin: Macular rashes, erythematous on the back  MSK: Normal muscle bulk,tone ,power Psychiatry: Judgement and insight appear normal. Mood & affect appropriate.      Data Reviewed: I have personally reviewed following labs and imaging studies  CBC: Recent Labs  Lab 12/13/18 0503 12/14/18 0416 12/15/18 0701 12/16/18 0428  WBC 13.9* 15.5* 12.6* 11.6*  HGB 11.5* 11.5* 11.8* 11.7*  HCT 36.2 36.5 37.8 38.0  MCV 88.5 88.2 88.3 90.0  PLT 195 183 152 431   Basic Metabolic Panel: Recent Labs  Lab 12/13/18 0503 12/14/18 0416 12/15/18 0701 12/16/18 0428 12/17/18 0613 12/18/18 0625  NA 132* 129* 135 136 135 135  K 3.3* 4.0 2.6* 2.9* 3.6 3.6  CL 101 95* 101 101 104 106  CO2 24 23 23 26 23  21*  GLUCOSE 155* 141* 127* 156* 152* 209*  BUN 22 27* 22 22 18 17   CREATININE 0.90 0.95 0.80 0.84 0.77 0.87  CALCIUM 8.6* 9.0 8.8* 8.4* 8.7* 8.7*  MG 1.8  --   --  1.5* 1.8  --    GFR: Estimated Creatinine Clearance: 62.7 mL/min (by C-G formula based on SCr of 0.87 mg/dL). Liver Function Tests: Recent Labs  Lab 12/13/18 0503 12/14/18 0416  12/15/18 0701 12/16/18 0428 12/17/18 0613  AST 187* 88* 55* 44* 35  ALT 749* 528* 354* 254* 183*  ALKPHOS 79 69 66 63 57  BILITOT 2.3* 1.8* 1.3* 1.1 1.1  PROT 4.9* 5.3* 5.0* 4.8* 4.8*  ALBUMIN 3.0* 3.0* 2.7* 2.6* 2.5*   No results for input(s): LIPASE, AMYLASE in the last 168 hours. Recent Labs  Lab 12/14/18 1043  AMMONIA 47*   Coagulation Profile: Recent Labs  Lab 12/13/18 0707 12/14/18 0416  INR 1.75 1.48   Cardiac Enzymes: Recent Labs  Lab 12/13/18 0503  CKTOTAL 290*   BNP (last 3 results) No results for input(s): PROBNP in the last 8760 hours. HbA1C: No results for input(s): HGBA1C in the last 72 hours. CBG: Recent Labs  Lab 12/18/18 1657 12/18/18 2030 12/19/18 0030 12/19/18 0505 12/19/18 0743  GLUCAP 197* 263* 148* 95 105*   Lipid Profile: No results for input(s): CHOL, HDL, LDLCALC, TRIG, CHOLHDL, LDLDIRECT in the last 72 hours. Thyroid Function Tests:  No results for input(s): TSH, T4TOTAL, FREET4, T3FREE, THYROIDAB in the last 72 hours. Anemia Panel: No results for input(s): VITAMINB12, FOLATE, FERRITIN, TIBC, IRON, RETICCTPCT in the last 72 hours. Sepsis Labs: No results for input(s): PROCALCITON, LATICACIDVEN in the last 168 hours.  Recent Results (from the past 240 hour(s))  Blood Culture (routine x 2)     Status: None   Collection Time: 12/09/18  5:51 PM  Result Value Ref Range Status   Specimen Description BLOOD RIGHT HAND  Final   Special Requests   Final    BOTTLES DRAWN AEROBIC AND ANAEROBIC Blood Culture results may not be optimal due to an inadequate volume of blood received in culture bottles   Culture   Final    NO GROWTH 5 DAYS Performed at [facility], [place] [address], [place], [place] [address]    Report Status 12/14/2018 FINAL  Final  Blood Culture (routine x 2)     Status: None   Collection Time: 12/09/18  6:06 PM  Result Value Ref Range Status   Specimen Description BLOOD LEFT HAND  Final   Special Requests   Final     BOTTLES DRAWN AEROBIC AND ANAEROBIC Blood Culture results may not be optimal due to an inadequate volume of blood received in culture bottles   Culture   Final    NO GROWTH 5 DAYS Performed at [facility], [place] [address], [place], [place] [address]    Report Status 12/14/2018 FINAL  Final  MRSA PCR Screening     Status: None   Collection Time: 12/09/18 11:00 PM  Result Value Ref Range Status   MRSA by PCR NEGATIVE NEGATIVE Final    Comment:        The GeneXpert MRSA Assay (FDA approved for NASAL specimens only), is one component of a comprehensive MRSA colonization surveillance program. It is not intended to diagnose MRSA infection nor to guide or monitor treatment for MRSA infections. Performed at [facility], [place] [address], [place], [place] [address]   Urine culture     Status: None   Collection Time: 12/10/18 12:01 AM  Result Value Ref Range Status   Specimen Description URINE, RANDOM  Final   Special Requests   Final    NONE Performed at [facility], [place] [address], [place], [place] [address]    Culture NO GROWTH  Final   Report Status 12/11/2018 FINAL  Final         Radiology Studies: No results found.      Scheduled Meds: . apixaban  5 mg Oral BID  . diltiazem  120 mg Oral Daily  . feeding supplement  1 Container Oral BID BM  . feeding supplement (PRO-STAT SUGAR FREE 64)  30 mL Oral BID  . furosemide  40 mg Oral Daily  . Gerhardt's butt cream   Topical BID  . insulin aspart  0-15 Units Subcutaneous Q4H  . insulin glargine  10 Units Subcutaneous QHS  . levothyroxine  25 mcg Oral QAC breakfast  . mouth rinse  15 mL Mouth Rinse BID  . metoprolol tartrate  50 mg Oral BID  . multivitamin with minerals  1 tablet Oral Daily  . pantoprazole  40 mg Oral Daily  . senna-docusate  1 tablet Oral BID   Continuous Infusions: . sodium chloride 10 mL/hr at 12/16/18 1002     LOS: 10 days    Time spent: 35 mins.More than 50% of that time  was spent in counseling and/or coordination of care.      Shelly Coss, MD [facility] Pager [phone]  If 7PM-7AM, please contact night-coverage [email or web address] Password [facility] 12/19/2018, 8:52 AM

## 2018-12-19 NOTE — Progress Notes (Signed)
Patient sleeping during shift report.      

## 2018-12-19 NOTE — Progress Notes (Signed)
Page to Adhikari,MD  3e11 [place] requesting PTA RX Clonazepam 0.5mg  BID be ordered for use.

## 2018-12-19 NOTE — Progress Notes (Signed)
Pt has rashes on her back and benadryl cream is not working, pt still c/o itching. Jeannette Corpus NP paged and ordered benadryl 50mg  IV once. Will continue to monitor.

## 2018-12-20 LAB — GLUCOSE, CAPILLARY
GLUCOSE-CAPILLARY: 251 mg/dL — AB (ref 70–99)
GLUCOSE-CAPILLARY: 44 mg/dL — AB (ref 70–99)
Glucose-Capillary: 154 mg/dL — ABNORMAL HIGH (ref 70–99)
Glucose-Capillary: 135 mg/dL — ABNORMAL HIGH (ref 70–99)
Glucose-Capillary: 251 mg/dL — ABNORMAL HIGH (ref 70–99)
Glucose-Capillary: 244 mg/dL — ABNORMAL HIGH (ref 70–99)
Glucose-Capillary: 331 mg/dL — ABNORMAL HIGH (ref 70–99)

## 2018-12-20 LAB — HEMOGLOBIN A1C
Hgb A1c MFr Bld: 6.5 % — ABNORMAL HIGH (ref 4.8–5.6)
Mean Plasma Glucose: 139.85 mg/dL

## 2018-12-20 MED ORDER — HYDROCORTISONE 1 % EX CREA
TOPICAL_CREAM | Freq: Three times a day (TID) | CUTANEOUS | Status: DC
  Administered 2018-12-20 – 2018-12-21 (×4): via TOPICAL
  Filled 2018-12-20 (×3): qty 28

## 2018-12-20 MED ORDER — DIPHENHYDRAMINE HCL 25 MG PO CAPS: 25.0000 mg | ORAL_CAPSULE | ORAL | Status: DC | PRN

## 2018-12-20 MED ORDER — METHYLPREDNISOLONE SODIUM SUCC 40 MG IJ SOLR
40.0000 mg | Freq: Two times a day (BID) | INTRAMUSCULAR | Status: DC
  Administered 2018-12-20 (×2): 40 mg via INTRAVENOUS
  Filled 2018-12-20 (×2): qty 1

## 2018-12-20 NOTE — Progress Notes (Addendum)
PROGRESS NOTE    Amanda [name]  TKP:[id number] DOB: September 30, 1946 DOA: 12/09/2018 PCP: Aretta Nip, MD   Brief Narrative: Patient is a 73 year old female with past medical history of A. fib on Eliquis, mitral regurgitation, sleep apnea, hypertension, diabetes mellitus who presented here with complaints of weakness, dizziness, nausea and vomiting.  She was found to be hypotensive with blood pressure in the range of 70s and acute kidney injury with creatinine of 3 and elevated liver enzymes.  She was initially admitted to ICU service.  Work-up for liver failure was unremarkable and was thought to be secondary to shock liver.  Cardiology was also following for A. fib with RVR.  Currently she is waiting for skilled nursing facility bed.  Assessment & Plan:   Active Problems:   Acute liver failure  Hypotension: Resolved.  Currently normotensive.  Thought to be secondary to hypovolemia, medications, severe MR.  IV fluids discontinued.  Acute liver failure: Suspected to be from shock liver from hypotension.  Hepatitis serology negative.  CT imaging did not show any acute gallbladder or biliary pathology.  Liver enzymes improving.  Work-up for autoimmune hepatitis negative.  Lactulose discontinued.  Statin will be held until follow-up as an outpatient.  Volume overload: Due to IV fluids.  IV fluids have been stopped.  She was treated with few doses of IV Lasix.  Currently she is euvolemic.  She does not have peripheral edema.  Acute kidney injury: Resolved  Chronic A. fib with RVR: Currently rate is controlled.  On metoprolol.  Eliquis has been restarted.  Also on Cardizem CD.  Severe mitral regurgitation: Cardiology following.  She will follow-up with cardiology on discharge,Dr [facility].  She can be a candidate for clip procedure.Started on Lasix 40 mg daily.  Diabetes mellitus type 2: Continue sliding scale insulin here.  Severe hypokalemia/hypomagnesemia: Supplemented and  corrected.  Debility/deconditioning: PT recommending skilled nursing facility on discharge.  Social worker following.  Urticaria/Allergic rash due to Linen: Maculopapular rash has progressed on her back and buttocks.  Will start on Solu-Medrol, p.o. Benadryl and steroid cream. Not  suspected to be related to any drugs but related to Linen.  Patient is medically stable for discharge to skilled nursing facility as soon as the bed is available.  Nutrition Problem: Inadequate oral intake Etiology: lethargy/confusion, decreased appetite      DVT prophylaxis: Eliquis Code Status: Full Family Communication: Family members present at the bedside Disposition Plan: Skilled nursing facility as soon as the bed is available   Consultants: Cardiology  Procedures: None  Antimicrobials:  Anti-infectives (From admission, onward)   Start     Dose/Rate Route Frequency Ordered Stop   12/10/18 1900  cefTRIAXone (ROCEPHIN) 1 g in sodium chloride 0.9 % 100 mL IVPB  Status:  Discontinued     1 g 200 mL/hr over 30 Minutes Intravenous Every 24 hours 12/09/18 2157 12/12/18 1005   12/10/18 0400  metroNIDAZOLE (FLAGYL) IVPB 500 mg  Status:  Discontinued     500 mg 100 mL/hr over 60 Minutes Intravenous Every 8 hours 12/09/18 2157 12/12/18 1005   12/09/18 1715  cefTRIAXone (ROCEPHIN) 1 g in sodium chloride 0.9 % 100 mL IVPB     1 g 200 mL/hr over 30 Minutes Intravenous  Once 12/09/18 1702 12/09/18 2103   12/09/18 1715  metroNIDAZOLE (FLAGYL) IVPB 500 mg     500 mg 100 mL/hr over 60 Minutes Intravenous  Once 12/09/18 1702 12/09/18 2103      Subjective: Patient seen and  examined the bedside this morning.  She has developed maculopapular  rash on her on back/buttocks and it has gotten worse today.Thought to be secondary to Linen.  Otherwise remains comfortable  . Objective: Vitals:   12/19/18 0511 12/19/18 1305 12/19/18 2020 12/20/18 0557  BP: 127/68 127/71 (!) 125/59 116/65  Pulse: 70 83 81 79   Resp: 18 16 18 18   Temp: 97.6 F (36.4 C) 98.4 F (36.9 C) 98.1 F (36.7 C) 98.1 F (36.7 C)  TempSrc: Oral Oral Oral   SpO2: 100% 100% 100% 99%  Weight:    84.2 kg  Height:        Intake/Output Summary (Last 24 hours) at 12/20/2018 1023 Last data filed at 12/20/2018 0900 Gross per 24 hour  Intake 1164.17 ml  Output 3800 ml  Net -2635.83 ml   Filed Weights   12/18/18 0448 12/19/18 0507 12/20/18 0557  Weight: 86.5 kg 85.3 kg 84.2 kg    Examination:  General exam: Appears calm and comfortable ,Not in distress,average built HEENT:PERRL,Oral mucosa moist, Ear/Nose normal on gross exam Respiratory system: Bilateral equal air entry, normal vesicular breath sounds, no wheezes or crackles  Cardiovascular system:Afib. No JVD, murmurs, rubs, gallops or clicks. Gastrointestinal system: Abdomen is nondistended, soft and nontender. No organomegaly or masses felt. Normal bowel sounds heard. Central nervous system: Alert and oriented. No focal neurological deficits. Extremities: No edema, no clubbing ,no cyanosis, distal peripheral pulses palpable. Skin: Maculopapular rash on the back and buttocks MSK: Normal muscle bulk,tone ,power Psychiatry: Judgement and insight appear normal. Mood & affect appropriate.      Data Reviewed: I have personally reviewed following labs and imaging studies  CBC: Recent Labs  Lab 12/14/18 0416 12/15/18 0701 12/16/18 0428  WBC 15.5* 12.6* 11.6*  HGB 11.5* 11.8* 11.7*  HCT 36.5 37.8 38.0  MCV 88.2 88.3 90.0  PLT 183 152 401   Basic Metabolic Panel: Recent Labs  Lab 12/14/18 0416 12/15/18 0701 12/16/18 0428 12/17/18 0613 12/18/18 0625  NA 129* 135 136 135 135  K 4.0 2.6* 2.9* 3.6 3.6  CL 95* 101 101 104 106  CO2 23 23 26 23  21*  GLUCOSE 141* 127* 156* 152* 209*  BUN 27* 22 22 18 17   CREATININE 0.95 0.80 0.84 0.77 0.87  CALCIUM 9.0 8.8* 8.4* 8.7* 8.7*  MG  --   --  1.5* 1.8  --    GFR: Estimated Creatinine Clearance: 62.4 mL/min (by  C-G formula based on SCr of 0.87 mg/dL). Liver Function Tests: Recent Labs  Lab 12/14/18 0416 12/15/18 0701 12/16/18 0428 12/17/18 0613  AST 88* 55* 44* 35  ALT 528* 354* 254* 183*  ALKPHOS 69 66 63 57  BILITOT 1.8* 1.3* 1.1 1.1  PROT 5.3* 5.0* 4.8* 4.8*  ALBUMIN 3.0* 2.7* 2.6* 2.5*   No results for input(s): LIPASE, AMYLASE in the last 168 hours. Recent Labs  Lab 12/14/18 1043  AMMONIA 47*   Coagulation Profile: Recent Labs  Lab 12/14/18 0416  INR 1.48   Cardiac Enzymes: No results for input(s): CKTOTAL, CKMB, CKMBINDEX, TROPONINI in the last 168 hours. BNP (last 3 results) No results for input(s): PROBNP in the last 8760 hours. HbA1C: No results for input(s): HGBA1C in the last 72 hours. CBG: Recent Labs  Lab 12/19/18 1149 12/19/18 1713 12/19/18 2055 12/20/18 0813 12/20/18 0835  GLUCAP 263* 251* 147* 44* 135*   Lipid Profile: No results for input(s): CHOL, HDL, LDLCALC, TRIG, CHOLHDL, LDLDIRECT in the last 72 hours. Thyroid Function Tests:  No results for input(s): TSH, T4TOTAL, FREET4, T3FREE, THYROIDAB in the last 72 hours. Anemia Panel: No results for input(s): VITAMINB12, FOLATE, FERRITIN, TIBC, IRON, RETICCTPCT in the last 72 hours. Sepsis Labs: No results for input(s): PROCALCITON, LATICACIDVEN in the last 168 hours.  No results found for this or any previous visit (from the past 240 hour(s)).       Radiology Studies: No results found.      Scheduled Meds: . apixaban  5 mg Oral BID  . buPROPion  300 mg Oral Daily  . clonazePAM  0.5 mg Oral BID  . diltiazem  120 mg Oral Daily  . feeding supplement  1 Container Oral BID BM  . feeding supplement (PRO-STAT SUGAR FREE 64)  30 mL Oral BID  . furosemide  40 mg Oral Daily  . Gerhardt's butt cream   Topical BID  . hydrocortisone cream   Topical TID  . insulin aspart  0-15 Units Subcutaneous TID WC & HS  . levothyroxine  25 mcg Oral QAC breakfast  . mouth rinse  15 mL Mouth Rinse BID  .  methylPREDNISolone (SOLU-MEDROL) injection  40 mg Intravenous Q12H  . metoprolol tartrate  50 mg Oral BID  . multivitamin with minerals  1 tablet Oral Daily  . oxybutynin  10 mg Oral BH-q7a  . pantoprazole  40 mg Oral Daily  . pravastatin  40 mg Oral QHS  . senna-docusate  1 tablet Oral BID  . traZODone  100 mg Oral QHS  . venlafaxine  100 mg Oral Daily   Continuous Infusions: . sodium chloride 10 mL/hr at 12/16/18 1002     LOS: 11 days    Time spent: 35 mins.More than 50% of that time was spent in counseling and/or coordination of care.      Shelly Coss, MD [facility] Pager [phone]  If 7PM-7AM, please contact night-coverage [email or web address] Password [facility] 12/20/2018, 10:23 AM

## 2018-12-20 NOTE — Clinical Social Work Note (Addendum)
Updated patient. Will call [place] to check insurance authorization status around lunchtime. Daughter and MD aware.  Amanda [name], [place] [phone]  12:27 pm Left voicemail for SNF admissions coordinator to check authorization status.  Amanda [name], [place] [phone]  2:41 pm SNF admissions coordinator has not heard from [facility] Medicare yet.  Amanda [name], [place] [phone]  3:39 pm Insurance authorization is still pending but SNF admissions coordinator said we should have it by tomorrow.  Amanda [name], [place]

## 2018-12-20 NOTE — Progress Notes (Signed)
Hypoglycemic Event  CBG: 44 Treatment: 8oz orange juice 8 oz juice/soda  Symptoms: Non None Follow-up CBG: [id number] CBG Result:135  Possible Reasons for Event:unknown Unknown  Comments/MD notified:Dr. Almon Hercules

## 2018-12-20 NOTE — Care Management Important Message (Signed)
Important Message  Patient Details  Name: ZORIYAH SCHEIDEGGER MRN: [id number] Date of Birth: 12-20-1945   Medicare Important Message Given:  Yes    Brenden Rudman P Elisama Thissen 12/20/2018, 4:07 PM

## 2018-12-21 LAB — GLUCOSE, CAPILLARY
Glucose-Capillary: 227 mg/dL — ABNORMAL HIGH (ref 70–99)
Glucose-Capillary: 397 mg/dL — ABNORMAL HIGH (ref 70–99)

## 2018-12-21 MED ORDER — PREDNISONE 20 MG PO TABS: 20.0000 mg | ORAL_TABLET | Freq: Every day | ORAL | Status: DC

## 2018-12-21 MED ORDER — DIPHENHYDRAMINE HCL 25 MG PO CAPS: 25.0000 mg | ORAL_CAPSULE | ORAL | 0 refills | Status: AC | PRN

## 2018-12-21 MED ORDER — CLONAZEPAM 0.5 MG PO TABS: 0.5000 mg | ORAL_TABLET | Freq: Two times a day (BID) | ORAL | 0 refills | Status: AC

## 2018-12-21 MED ORDER — PREDNISONE 20 MG PO TABS: 40.0000 mg | ORAL_TABLET | Freq: Every day | ORAL | 0 refills | Status: AC

## 2018-12-21 MED ORDER — HYDROCORTISONE 1 % EX CREA: TOPICAL_CREAM | Freq: Three times a day (TID) | CUTANEOUS | 0 refills | Status: AC

## 2018-12-21 MED ORDER — FUROSEMIDE 40 MG PO TABS: 40.0000 mg | ORAL_TABLET | Freq: Every day | ORAL | Status: AC

## 2018-12-21 MED ORDER — DILTIAZEM HCL ER COATED BEADS 120 MG PO CP24: 120.0000 mg | ORAL_CAPSULE | Freq: Every day | ORAL | Status: AC

## 2018-12-21 MED ORDER — PREDNISONE 20 MG PO TABS
40.0000 mg | ORAL_TABLET | Freq: Every day | ORAL | Status: DC
  Administered 2018-12-21: 40 mg via ORAL
  Filled 2018-12-21: qty 2

## 2018-12-21 MED ORDER — METOPROLOL TARTRATE 50 MG PO TABS: 50.0000 mg | ORAL_TABLET | Freq: Two times a day (BID) | ORAL | Status: AC

## 2018-12-21 NOTE — Clinical Social Work Note (Signed)
CSW facilitated patient discharge including contacting patient family and facility to confirm patient discharge plans. Clinical information faxed to facility and family agreeable with plan. Patient's daughter will transport by car to [employer]. RN to call report prior to discharge [phone]).  CSW will sign off for now as social work intervention is no longer needed. Please consult [place] again if new needs arise.  Dayton Scrape, [place]

## 2018-12-21 NOTE — Progress Notes (Signed)
Physical Therapy Treatment Patient Details Name: Amanda [name] MRN: [id number] DOB: 05-Aug-1946 Today's Date: 12/21/2018    History of Present Illness 73 y.o. female admitted on 12/09/18 for acute liver failure, acute kidney injury, metabolic acidosis, rhabdomyolysis, acute hepatitis/transaminitis, lactic acidosis, A-fib with RVR, severe coagulopathy, and hypokalemia.  Pt with significant PMH of vertigo, MVP, HTN, DM2, anxiety, anemia, A-fib, bil TKA, throat surgery, R leg fracture surgery.      PT Comments    Patient progressing well towards PT goals. Improved ambulation distance with close min guard for balance/safety. HR ranged from 115-150s bpm during activity and 2/4 DOE. Pt fatigues with activity. Reports feeling clearer with mentation and cognition. Pt with new hives/rash on back and throughout body that is itchy. MD and RN aware. Eager to get to rehab today. Will follow.    Follow Up Recommendations  SNF     Equipment Recommendations  None recommended by PT    Recommendations for Other Services       Precautions / Restrictions Precautions Precautions: Fall Precaution Comments: watch HR Restrictions Weight Bearing Restrictions: No    Mobility  Bed Mobility Overal bed mobility: Needs Assistance Bed Mobility: Rolling;Sidelying to Sit Rolling: Supervision Sidelying to sit: Supervision;HOB elevated       General bed mobility comments: No assist needed, use of rail for support.  Transfers Overall transfer level: Needs assistance Equipment used: Rolling walker (2 wheeled) Transfers: Sit to/from Stand Sit to Stand: Min guard         General transfer comment: Min guard for safety. Stood from [employer], transferred to chair post ambulation.  Ambulation/Gait Ambulation/Gait assistance: Min guard Gait Distance (Feet): 100 Feet Assistive device: Rolling walker (2 wheeled) Gait Pattern/deviations: Step-through pattern;Decreased stride length;Trunk flexed Gait velocity:  decreased   General Gait Details: Slow, unsteady gait listing left (not sure if this is the walker or pt). Difficulty with turns; 2/4 DOE. HR ranged from 115-150s bpm.   Stairs             Wheelchair Mobility    Modified Rankin (Stroke Patients Only)       Balance Overall balance assessment: Needs assistance Sitting-balance support: Feet supported;No upper extremity supported Sitting balance-Leahy Scale: Good Sitting balance - Comments: Able to reach down to adjust sock without LOB.   Standing balance support: During functional activity;Bilateral upper extremity supported Standing balance-Leahy Scale: Poor Standing balance comment: Reliant on BUEs for support in standing.                            Cognition Arousal/Alertness: Awake/alert Behavior During Therapy: WFL for tasks assessed/performed Overall Cognitive Status: Within Functional Limits for tasks assessed                                 General Comments: WFL for basic tasks and conversation. Much better from prior sessions. likely close to baseline.       Exercises      General Comments General comments (skin integrity, edema, etc.): Pt with hives throughout back and body. Rubbed some cream on it per pt request.       Pertinent Vitals/Pain Pain Assessment: Faces Faces Pain Scale: Hurts a little bit Pain Location: right hip Pain Descriptors / Indicators: Discomfort Pain Intervention(s): Monitored during session    Home Living  Prior Function            PT Goals (current goals can now be found in the care plan section) Progress towards PT goals: Progressing toward goals    Frequency    Min 2X/week      PT Plan Current plan remains appropriate    Co-evaluation              AM-PAC PT "6 Clicks" Mobility   Outcome Measure  Help needed turning from your back to your side while in a flat bed without using bedrails?: A  Little Help needed moving from lying on your back to sitting on the side of a flat bed without using bedrails?: A Little Help needed moving to and from a bed to a chair (including a wheelchair)?: None Help needed standing up from a chair using your arms (e.g., wheelchair or bedside chair)?: None Help needed to walk in hospital room?: A Little Help needed climbing 3-5 steps with a railing? : [employer] 6 Click Score: 19    End of Session Equipment Utilized During Treatment: Gait belt Activity Tolerance: Patient tolerated treatment well Patient left: in chair;with call bell/phone within reach;with chair alarm set;with nursing/sitter in room Nurse Communication: Mobility status PT Visit Diagnosis: Muscle weakness (generalized) (M62.81);Difficulty in walking, not elsewhere classified (R26.2)     Time: 3295-1884 PT Time Calculation (min) (ACUTE ONLY): 16 min  Charges:  $Gait Training: 8-22 mins                     Wray Kearns, PT, DPT Acute Rehabilitation Services Pager [phone] Office [place] 12/21/2018, 8:21 AM

## 2018-12-21 NOTE — Clinical Social Work Note (Addendum)
SNF admissions coordinator is not in yet. Left voicemail to check auth status.  Dayton Scrape, [place] [phone]  11:14 am Insurance authorization approved. Daughter aware. Faxed discharge summary to SNF admissions coordinator. Will call soon to confirm she received it and patient can go ahead and transport to the facility.   Dayton Scrape, [place]

## 2018-12-21 NOTE — Discharge Summary (Signed)
Physician Discharge Summary  MARCHELL FROMAN TGG:[id number] DOB: 04/04/1946 DOA: 12/09/2018  PCP: Aretta Nip, MD  Admit date: 12/09/2018 Discharge date: 12/21/2018  Admitted From: Home Disposition:  SNF  Discharge Condition:Stable CODE STATUS:FULL Diet recommendation: Heart Healthy   Brief/Interim Summary: Patient is a 73 year old female with past medical history of A. fib on Eliquis, mitral regurgitation, sleep apnea, hypertension, diabetes mellitus who presented here with complaints of weakness, dizziness, nausea and vomiting.  She was found to be hypotensive with blood pressure in the range of 70s and acute kidney injury with creatinine of 3 and elevated liver enzymes.  She was initially admitted to ICU service.  Work-up for liver failure was unremarkable and was thought to be secondary to shock liver. Cardiology was also following for A. fib with RVR.  Currently she is hemodynamically stable.  She is stable for discharge to skilled nursing facility today.  Following problems were addressed during her hospitalization:  Hypotension: Resolved.  Currently normotensive.  Thought to be secondary to hypovolemia, medications, severe MR.  IV fluids discontinued.  Acute liver failure: Suspected to be from shock liver from hypotension.  Hepatitis serology negative.  CT imaging did not show any acute gallbladder or biliary pathology.  Liver enzymes improving.  Work-up for autoimmune hepatitis negative.  Lactulose discontinued.    Volume overload: Due to IV fluids.  IV fluids have been stopped.  She was treated with few doses of IV Lasix.  Currently she is euvolemic.  She does not have peripheral edema.  Acute kidney injury: Resolved  Chronic A. fib with RVR: Currently rate is controlled.  On metoprolol.  Eliquis has been restarted.  Also on Cardizem CD.  Severe mitral regurgitation: Cardiology following.  She will follow-up with cardiology on discharge,Dr [facility].  She can be a candidate  for clip procedure.Started on Lasix 40 mg daily.  Diabetes mellitus type 2: Resumed home meds  Severe hypokalemia/hypomagnesemia: Supplemented and corrected.  Debility/deconditioning: PT recommending skilled nursing facility on discharge.  Social worker following.  Urticaria/Allergic rash due to Linen: Has macular rashes thought to be secondary to allergy. Not  suspected to be related to any drugs but related to Linen.  Rash has improved today.  Continue steroids for few more days.  Continue topical steroids and Benadryl PRN.       Discharge Diagnoses:  Active Problems:   Acute liver failure    Discharge Instructions  Discharge Instructions    Diet - low sodium heart healthy   Complete by:  As directed    Discharge instructions   Complete by:  As directed    1)Take prescribed medications as instructed. 2)Follow up with cardiology as an outpatient in 2 weeks.  Name and number the provider has been attached.   Increase activity slowly   Complete by:  As directed      Allergies as of 12/21/2018      Reactions   Aciphex [rabeprazole Sodium] Hives   Coumadin [warfarin Sodium] Other (See Comments)   Bleeding   Erythromycin Nausea And Vomiting   Lactose Intolerance (gi) Diarrhea   Stomach pain   Macrodantin Nausea And Vomiting   Nsaids    Bleeding in bladder   Atorvastatin Other (See Comments)   Body aches   Sulfa Antibiotics Nausea Only, Other (See Comments)   Feel terrible   Tape Rash      Medication List    TAKE these medications   apixaban 5 MG Tabs tablet Commonly known as:  ELIQUIS Take 1  tablet (5 mg total) by mouth 2 (two) times daily.   ARTIFICIAL TEAR SOLUTION OP Place 1 drop into both eyes daily as needed (dry eyes).   buPROPion 300 MG 24 hr tablet Commonly known as:  WELLBUTRIN XL Take 1 tablet (300 mg total) by mouth daily.   cetirizine 10 MG tablet Commonly known as:  ZYRTEC Take 10 mg by mouth daily as needed for allergies.   clonazePAM  0.5 MG tablet Commonly known as:  KLONOPIN Take 1 tablet (0.5 mg total) by mouth 2 (two) times daily.   diltiazem 120 MG 24 hr capsule Commonly known as:  CARDIZEM CD Take 1 capsule (120 mg total) by mouth daily. Start taking on:  December 22, 2018   diphenhydrAMINE 25 mg capsule Commonly known as:  BENADRYL Take 1 capsule (25 mg total) by mouth every 4 (four) hours as needed for itching.   fluticasone 50 MCG/ACT nasal spray Commonly known as:  FLONASE Place 1 spray into both nostrils daily as needed for allergies.   furosemide 40 MG tablet Commonly known as:  LASIX Take 1 tablet (40 mg total) by mouth daily. Start taking on:  December 22, 2018   hydrocortisone cream 1 % Apply topically 3 (three) times daily. Apply on the rashes   levothyroxine 25 MCG tablet Commonly known as:  SYNTHROID, LEVOTHROID Take 25 mcg by mouth daily before breakfast.   metFORMIN 500 MG 24 hr tablet Commonly known as:  GLUCOPHAGE-XR Take 500 mg by mouth 2 (two) times daily.   metoprolol tartrate 50 MG tablet Commonly known as:  LOPRESSOR Take 1 tablet (50 mg total) by mouth 2 (two) times daily.   montelukast 10 MG tablet Commonly known as:  SINGULAIR Take 10 mg by mouth daily as needed (allergies).   omeprazole 20 MG capsule Commonly known as:  PRILOSEC Take 20 mg by mouth every evening.   oxybutynin 10 MG 24 hr tablet Commonly known as:  DITROPAN-XL Take 10 mg by mouth every morning.   pravastatin 40 MG tablet Commonly known as:  PRAVACHOL Take 40 mg by mouth at bedtime.   predniSONE 20 MG tablet Commonly known as:  DELTASONE Take 2 tablets (40 mg total) by mouth daily with breakfast for 4 days. Start taking on:  December 22, 2018   traZODone 100 MG tablet Commonly known as:  DESYREL Take 100 mg by mouth at bedtime.   venlafaxine 50 MG tablet Commonly known as:  EFFEXOR Take 2 tablets (100 mg total) by mouth daily.      Follow-up Information    Dorothy Spark, MD.  Schedule an appointment as soon as possible for a visit in 2 week(s).   Specialty:  Cardiology Contact information: [place] [id number] [phone]          Allergies  Allergen Reactions  . Aciphex [Rabeprazole Sodium] Hives  . Coumadin [Warfarin Sodium] Other (See Comments)    Bleeding   . Erythromycin Nausea And Vomiting  . Lactose Intolerance (Gi) Diarrhea    Stomach pain  . Macrodantin Nausea And Vomiting  . Nsaids     Bleeding in bladder  . Atorvastatin Other (See Comments)    Body aches  . Sulfa Antibiotics Nausea Only and Other (See Comments)    Feel terrible  . Tape Rash    Consultations:  Cardiology   Procedures/Studies: Ct Abdomen Pelvis Wo Contrast  Result Date: 12/09/2018 CLINICAL DATA:  Nausea, vomiting, weakness and dizziness 5 days. EXAM: CT  ABDOMEN AND PELVIS WITHOUT CONTRAST TECHNIQUE: Multidetector CT imaging of the abdomen and pelvis was performed following the standard protocol without IV contrast. COMPARISON:  03/11/2017 and 10/11/2014 and MRI lumbar spine 07/30/2017 FINDINGS: Lower chest: Lung bases demonstrate a tiny amount of right pleural fluid with subtle hazy density over the posterior right base likely atelectasis. Mild cardiomegaly. Hepatobiliary: Previous cholecystectomy. Liver and biliary tree are within normal. Pancreas: Subtle hazy attenuation adjacent the head of the pancreas/common bile duct. Findings could be due to mild acute pancreatitis. Spleen: Normal. Adrenals/Urinary Tract: Adrenal glands are normal. Kidneys normal in size and demonstrate no hydronephrosis or nephrolithiasis. There are several bilateral cysts unchanged. Ureters and bladder are normal. Stomach/Bowel: Stomach and small bowel are normal. Appendix is normal. Colon is normal. Vascular/Lymphatic: Minimal calcified plaque over the abdominal aorta. No adenopathy. Reproductive: Previous hysterectomy. Other: None. Musculoskeletal: Degenerative  change of the spine. Known mild L4 compression fracture. IMPRESSION: Subtle hazy attenuation of the peripancreatic fat in the region of the head of the pancreas/common bile duct which could be seen due to mild acute pancreatitis. Recommend clinical correlation. Small right pleural effusion with mild dependent right basilar atelectasis. Bilateral renal cysts unchanged. Aortic Atherosclerosis (ICD10-I70.0). Stable L4 compression fracture. Electronically Signed   By: Marin Olp M.D.   On: 12/09/2018 18:57   Dg Chest Port 1 View  Result Date: 12/12/2018 CLINICAL DATA:  Hypoxia EXAM: PORTABLE CHEST 1 VIEW COMPARISON:  Chest x-rays dated 12/10/2018 and 12/09/2018. FINDINGS: Stable cardiomegaly. LEFT IJ central line is stable in position with tip overlying the RIGHT atrium. New opacity at the LEFT lung base, atelectasis and/or small pleural effusion. RIGHT lung remains clear. No pneumothorax seen. IMPRESSION: New atelectasis and/or small pleural effusion at the LEFT lung base. Electronically Signed   By: Franki Cabot M.D.   On: 12/12/2018 06:31   Dg Chest Port 1 View  Result Date: 12/10/2018 CLINICAL DATA:  Central line placement. EXAM: PORTABLE CHEST 1 VIEW COMPARISON:  Chest radiograph December 09, 2018 FINDINGS: Cardiac silhouette is upper limits of normal size. Calcified aortic arch. Low inspiratory examination with elevated RIGHT hemidiaphragm. No pleural effusion or focal consolidation. No pneumothorax. New LEFT internal jugular central venous catheter distal tip projects at cavoatrial junction. Soft tissue planes and included osseous structures are unchanged. Surgical clips in the included right abdomen compatible with cholecystectomy. IMPRESSION: 1. LEFT IJ central venous catheter distal tip projects at cavoatrial junction. No pneumothorax. 2. Stable cardiomegaly.  No acute pulmonary process. Electronically Signed   By: Elon Alas M.D.   On: 12/10/2018 04:55   Dg Chest Portable 1 View  Result  Date: 12/09/2018 CLINICAL DATA:  Nausea, vomiting, weakness, and dizziness for 5 days, history atrial fibrillation, hypertension, diabetes mellitus, asthma, pneumonia EXAM: PORTABLE CHEST 1 VIEW COMPARISON:  Portable exam 1633 hours compared to 11/14/2015 FINDINGS: Enlargement of cardiac silhouette. Mediastinal contours and pulmonary vascularity normal. Lungs clear. No pleural effusion or pneumothorax. Bones unremarkable. IMPRESSION: Mild enlargement of cardiac silhouette. No acute abnormalities. Electronically Signed   By: Lavonia Dana M.D.   On: 12/09/2018 16:48   US Liver Doppler  Result Date: 12/10/2018 CLINICAL DATA:  73 year old female with nausea vomiting. Earlier CT of the abdomen pelvis demonstrated peripancreatic inflammation. EXAM: DUPLEX ULTRASOUND OF LIVER TECHNIQUE: Color and duplex Doppler ultrasound was performed to evaluate the hepatic in-flow and out-flow vessels. COMPARISON:  CT of the abdomen pelvis dated 12/09/2018 FINDINGS: Liver: The liver demonstrates a slightly heterogeneous echogenicity with coarsened echotexture. No significant surface nodularity. No  biliary ductal dilatation. Main Portal Vein size: 1 cm Portal Vein Velocities Main Prox:  30 cm/sec Main Mid: 20 cm/sec Main Dist:  20 cm/sec Right: 5 cm/sec Left: 5 cm/sec Hepatic Vein Velocities Right:  10 cm/sec Middle:  5 cm/sec Left:  8 cm/sec IVC: Present and patent with normal respiratory phasicity. Hepatic Artery Velocity:  83 cm/sec Splenic Vein Velocity:  4 cm/sec Spleen: 7.3 x 3.3 x 3.8 cm with a total volume of 50 cm^3 (411 cm^3 is upper limit normal) Portal Vein Occlusion/Thrombus: No. There is incomplete filling of the midportion of the main portal vein with color, likely artifactual. Attention to this area on follow-up imaging recommended. Splenic Vein Occlusion/Thrombus: No Ascites: None Varices: None A right pleural effusion is partially visualized. IMPRESSION: 1. Mildly coarsened liver echotexture without definite  sonographic findings of cirrhosis. 2. Patent main portal vein with hepatopetal flow. 3. Patent hepatic veins with appropriate flow direction. 4. Right-sided pleural effusion. Electronically Signed   By: Anner Crete M.D.   On: 12/10/2018 02:35       Subjective: Patient seen and examined the bedside this morning.  Remains comfortable.  Hemodynamically stable.  Rash has improved.  Discharge Exam: Vitals:   12/21/18 0832 12/21/18 0951  BP: 121/68   Pulse: 94 100  Resp: (!) 22 18  Temp: 98.7 F (37.1 C)   SpO2: 100%    Vitals:   12/20/18 2037 12/21/18 0346 12/21/18 0832 12/21/18 0951  BP: 114/76 (!) 108/54 121/68   Pulse: 89 (!) 46 94 100  Resp:  18 (!) 22 18  Temp: (!) 97.5 F (36.4 C) 97.6 F (36.4 C) 98.7 F (37.1 C)   TempSrc: Oral Oral Oral   SpO2: 99% 94% 100%   Weight:  84.5 kg    Height:        General: Pt is alert, awake, not in acute distress Cardiovascular: RRR, S1/S2 +, no rubs, no gallops Respiratory: CTA bilaterally, no wheezing, no rhonchi Abdominal: Soft, NT, ND, bowel sounds + Extremities: no edema, no cyanosis    The results of significant diagnostics from this hospitalization (including imaging, microbiology, ancillary and laboratory) are listed below for reference.     Microbiology: No results found for this or any previous visit (from the past 240 hour(s)).   Labs: BNP (last 3 results) No results for input(s): BNP in the last 8760 hours. Basic Metabolic Panel: Recent Labs  Lab 12/15/18 0701 12/16/18 0428 12/17/18 0613 12/18/18 0625  NA 135 136 135 135  K 2.6* 2.9* 3.6 3.6  CL 101 101 104 106  CO2 23 26 23  21*  GLUCOSE 127* 156* 152* 209*  BUN 22 22 18 17   CREATININE 0.80 0.84 0.77 0.87  CALCIUM 8.8* 8.4* 8.7* 8.7*  MG  --  1.5* 1.8  --    Liver Function Tests: Recent Labs  Lab 12/15/18 0701 12/16/18 0428 12/17/18 0613  AST 55* 44* 35  ALT 354* 254* 183*  ALKPHOS 66 63 57  BILITOT 1.3* 1.1 1.1  PROT 5.0* 4.8* 4.8*   ALBUMIN 2.7* 2.6* 2.5*   No results for input(s): LIPASE, AMYLASE in the last 168 hours. No results for input(s): AMMONIA in the last 168 hours. CBC: Recent Labs  Lab 12/15/18 0701 12/16/18 0428  WBC 12.6* 11.6*  HGB 11.8* 11.7*  HCT 37.8 38.0  MCV 88.3 90.0  PLT 152 150   Cardiac Enzymes: No results for input(s): CKTOTAL, CKMB, CKMBINDEX, TROPONINI in the last 168 hours. BNP: Invalid input(s): POCBNP  CBG: Recent Labs  Lab 12/20/18 0835 12/20/18 1143 12/20/18 1629 12/20/18 2146 12/21/18 0732  GLUCAP 135* 251* 244* 331* 227*   D-Dimer No results for input(s): DDIMER in the last 72 hours. Hgb A1c Recent Labs    12/20/18 0937  HGBA1C 6.5*   Lipid Profile No results for input(s): CHOL, HDL, LDLCALC, TRIG, CHOLHDL, LDLDIRECT in the last 72 hours. Thyroid function studies No results for input(s): TSH, T4TOTAL, T3FREE, THYROIDAB in the last 72 hours.  Invalid input(s): FREET3 Anemia work up No results for input(s): VITAMINB12, FOLATE, FERRITIN, TIBC, IRON, RETICCTPCT in the last 72 hours. Urinalysis    Component Value Date/Time   COLORURINE AMBER (A) 12/10/2018 0000   APPEARANCEUR HAZY (A) 12/10/2018 0000   LABSPEC 1.021 12/10/2018 0000   PHURINE 5.0 12/10/2018 0000   GLUCOSEU NEGATIVE 12/10/2018 0000   HGBUR LARGE (A) 12/10/2018 0000   BILIRUBINUR NEGATIVE 12/10/2018 0000   KETONESUR 20 (A) 12/10/2018 0000   PROTEINUR 100 (A) 12/10/2018 0000   UROBILINOGEN 0.2 10/15/2014 2020   NITRITE NEGATIVE 12/10/2018 0000   LEUKOCYTESUR NEGATIVE 12/10/2018 0000   Sepsis Labs Invalid input(s): PROCALCITONIN,  WBC,  LACTICIDVEN Microbiology No results found for this or any previous visit (from the past 240 hour(s)).  Please note: You were cared for by a hospitalist during your hospital stay. Once you are discharged, your primary care physician will handle any further medical issues. Please note that NO REFILLS for any discharge medications will be authorized once you  are discharged, as it is imperative that you return to your primary care physician (or establish a relationship with a primary care physician if you do not have one) for your post hospital discharge needs so that they can reassess your need for medications and monitor your lab values.    Time coordinating discharge: 40 minutes  SIGNED:   Shelly Coss, MD  [facility] 12/21/2018, 10:59 AM Pager [phone]  If 7PM-7AM, please contact night-coverage [email or web address] Password [email or web address]

## 2018-12-21 NOTE — Progress Notes (Signed)
Pt ambulating with PT

## 2018-12-21 NOTE — Clinical Social Work Placement (Signed)
   CLINICAL SOCIAL WORK PLACEMENT  NOTE  Date:  12/21/2018  Patient Details  Name: Amanda [name] MRN: [id number] Date of Birth: 09-01-1946  Clinical Social Work is seeking post-discharge placement for this patient at the [place] level of care (*CSW will initial, date and re-position this form in  chart as items are completed):      Patient/family provided with [facility] Work Department's list of facilities offering this level of care within the geographic area requested by the patient (or if unable, by the patient's family).      Patient/family informed of their freedom to choose among providers that offer the needed level of care, that participate in Medicare, Medicaid or managed care program needed by the patient, have an available bed and are willing to accept the patient.      Patient/family informed of [facility]'s ownership interest in [facility] and [facility], as well as of the fact that they are under no obligation to receive care at these facilities.  PASRR submitted to EDS on 12/14/18     PASRR number received on       Existing PASRR number confirmed on 12/14/18     FL2 transmitted to all facilities in geographic area requested by pt/family on 12/14/18     FL2 transmitted to all facilities within larger geographic area on       Patient informed that his/her managed care company has contracts with or will negotiate with certain facilities, including the following:        Yes   Patient/family informed of bed offers received.  Patient chooses bed at [facility])     Physician recommends and patient chooses bed at      Patient to be transferred to [facility]) on 12/21/18.  Patient to be transferred to facility by Daughter will transport by car     Patient family notified on 12/21/18 of transfer.  Name of family member notified:  Clearance Coots     PHYSICIAN Please prepare prescriptions     Additional Comment:     _______________________________________________ Candie Chroman, LCSW 12/21/2018, 11:15 AM

## 2018-12-29 ENCOUNTER — Encounter: Payer: Self-pay | Admitting: Physician Assistant

## 2018-12-29 NOTE — Progress Notes (Deleted)
Cardiology Office Note    Date:  12/29/2018  ID:  Amanda [name], DOB Sep 06, 1946, MRN [id number] PCP:  Aretta Nip, MD  Cardiologist:  Ena Dawley, MD   Chief Complaint: ***  History of Present Illness:  Amanda [name] is a 73 y.o. female with history of hypertension, hyperlipidemia, hypothyroidism, atrial fibrillation s/p DCCV 09/2018, mitral regurgitation, OSA on CPAP, frequent falls, recent hepatic/renal failure, volume overload who presents for post-hospital follow-up.   She was admitted to [facility] 09/2018 after multiple falls and significant weight loss and was found to be in atrial fibrillation with RVR. Her falls and weakness were thought secondary to  Klonopin and oxybutynin. Her echocardiogram performed showed normal LV function with an LVEF of 55 to 60% with moderate mitral regurgitation, mild posterior leaflet prolapse, left atrium moderately dilated and right ventricular systolic function mildly reduced.  She was discharged on metoprolol 75 mg p.o. twice daily and Eliquis 5 mg twice daily with a plan to outpatient cardiovert her 4 weeks after anticoagulation. She underwent a successful DCCV on 10/11/2018, which was coupled with a TEE that demonstrated severe MR, EF 50-55%, mild LAE< mild RV dilation, mildly reduced RV function. She held NSR for 1 day then went back out of rhythm and has since been followed by afib clinic. Lexiscan was obtained 11/03/18 which showed small defect of mild severity present in the apex location, intermediate risk study based upon reduced ejection EF 39%. Butch Penny recommended to return to discuss flecainide but this was deferred given drug interaction with Wellbutrin. She was going to speak with her PCP about alternatives to Wellbutrin. In the interim, she was readmitted 1/23-12/21/18 with weakness, dizziness, and vomiting. She was hypotensive in the 70s with AKI with Cr of 3 and acute liver failure. Her hypotension was felt secondary to  hypervolemia, medications and mitral regurgitation. Her liver failure was suspected due to shock liver. Hepatitis serology was negative. CT of her abdomen and pelvis demonstrated possible mild pancreatitis with small right pleural effusion. She was treated with ceftriaxone and Flagyl. She was loaded with acetylcysteine. Work-up for autoimmune hepatitis negative. She was given IV fluids and liver/kidney failure improved, but she subsequently developed volume overload. She was given a few doses of IV Lasix. Her anticoagulation was initially held due to liver failure then resumed. She was in atrial fib during her stay but managed with rate control. Last labs 12/18/18 showed K 3.6, Cr 0.87, albumin 2.5, AST 35, ALT 183, Mg 1.8, Hgb 11.7, 11/2018 TSH wnl.  mitral clip limited echo for EF  Persistent atrial fibrillation Mitral regurgitation Essential HTN Recent renal failure/liver failure with volume overload    Past Medical History:  Diagnosis Date  . Acute liver failure 11/2018  . AKI (acute kidney injury) ([place])   . Anemia due to blood loss   . Anxiety   . Arthritis   . Asthma    seasonal or with bronchitis  . Complication of anesthesia    "forgets to breathe" when she wakes up  . Depression   . Diabetes type 2, controlled ([place])    fasting 100-120  . GERD (gastroesophageal reflux disease)   . Headache   . Hepatitis    A -- as teenager  . Hyperlipidemia   . Hypertension   . Hypothyroidism   . Mitral valve prolapse   . OSA on CPAP   . Persistent atrial fibrillation   . Pneumonia    hx of  . Severe mitral regurgitation   .  Vertigo     Past Surgical History:  Procedure Laterality Date  . ABDOMINAL HYSTERECTOMY    . CARDIOVERSION N/A 10/11/2018   Procedure: CARDIOVERSION;  Surgeon: Dorothy Spark, MD;  Location: [facility] ENDOSCOPY;  Service: Cardiovascular;  Laterality: N/A;  . CHOLECYSTECTOMY    . FRACTURE SURGERY Right    leg  . GALLBLADDER SURGERY  1995  . JOINT REPLACEMENT  Left    knee  . NASAL SINUS SURGERY  2005  . PARTIAL HYSTERECTOMY  1983  . TEE WITHOUT CARDIOVERSION N/A 10/11/2018   Procedure: TRANSESOPHAGEAL ECHOCARDIOGRAM (TEE);  Surgeon: Dorothy Spark, MD;  Location: [facility] ENDOSCOPY;  Service: Cardiovascular;  Laterality: N/A;  . THROAT SURGERY  2005  . TOTAL KNEE ARTHROPLASTY Right 09/12/2014   Procedure: TOTAL KNEE ARTHROPLASTY;  Surgeon: Meredith Pel, MD;  Location: [place];  Service: Orthopedics;  Laterality: Right;    Current Medications: No outpatient medications have been marked as taking for the 12/31/18 encounter (Appointment) with Charlie Pitter, PA-C.   ***   Allergies:   Aciphex [rabeprazole sodium]; Coumadin [warfarin sodium]; Erythromycin; Lactose intolerance (gi); Macrodantin; Nsaids; Atorvastatin; Sulfa antibiotics; and Tape   Social History   Socioeconomic History  . Marital status: Married    Spouse name: Not on file  . Number of children: Not on file  . Years of education: Not on file  . Highest education level: Not on file  Occupational History  . Occupation: retired    Comment: Secondary school teacher  Social Needs  . Financial resource strain: Not on file  . Food insecurity:    Worry: Not on file    Inability: Not on file  . Transportation needs:    Medical: Not on file    Non-medical: Not on file  Tobacco Use  . Smoking status: Never Smoker  . Smokeless tobacco: Never Used  Substance and Sexual Activity  . Alcohol use: No    Comment: socially  . Drug use: No  . Sexual activity: Not on file  Lifestyle  . Physical activity:    Days per week: Not on file    Minutes per session: Not on file  . Stress: Not on file  Relationships  . Social connections:    Talks on phone: Not on file    Gets together: Not on file    Attends religious service: Not on file    Active member of club or organization: Not on file    Attends meetings of clubs or organizations: Not on file    Relationship status: Not on file    Other Topics Concern  . Not on file  Social History Narrative  . Not on file     Family History:  The patient's ***family history includes Breast cancer in her maternal aunt; Cancer in her mother; Cancer - Colon in her mother.  ROS:   Please see the history of present illness. Otherwise, review of systems is positive for ***.  All other systems are reviewed and otherwise negative.    PHYSICAL EXAM:   VS:  There were no vitals taken for this visit.  BMI: There is no height or weight on file to calculate BMI. GEN: Well nourished, well developed, in no acute distress HEENT: normocephalic, atraumatic Neck: no JVD, carotid bruits, or masses Cardiac: ***RRR; no murmurs, rubs, or gallops, no edema  Respiratory:  clear to auscultation bilaterally, normal work of breathing GI: soft, nontender, nondistended, + BS MS: no deformity or atrophy Skin: warm and dry, no  rash Neuro:  Alert and Oriented x 3, Strength and sensation are intact, follows commands Psych: euthymic mood, full affect  Wt Readings from Last 3 Encounters:  12/21/18 186 lb 4.8 oz (84.5 kg)  11/22/18 173 lb (78.5 kg)  11/03/18 175 lb (79.4 kg)      Studies/Labs Reviewed:   EKG:  EKG was ordered today and personally reviewed by me and demonstrates *** EKG was not ordered today.***  Recent Labs: 12/09/2018: TSH 3.903 12/16/2018: Hemoglobin 11.7; Platelets 150 12/17/2018: ALT 183; Magnesium 1.8 12/18/2018: BUN 17; Creatinine, Ser 0.87; Potassium 3.6; Sodium 135   Lipid Panel No results found for: CHOL, TRIG, HDL, CHOLHDL, VLDL, LDLCALC, LDLDIRECT  Additional studies/ records that were reviewed today include: Summarized above.***    ASSESSMENT & PLAN:   1. ***  Disposition: F/u with ***   Medication Adjustments/Labs and Tests Ordered: Current medicines are reviewed at length with the patient today.  Concerns regarding medicines are outlined above. Medication changes, Labs and Tests ordered today are summarized  above and listed in the Patient Instructions accessible in Encounters.   Signed, Charlie Pitter, PA-C  12/29/2018 1:30 PM    [facility] [address], [place], [place]  [address] Phone: [phone]; Fax: [phone]

## 2018-12-31 ENCOUNTER — Ambulatory Visit: Payer: Medicare Other | Admitting: Physician Assistant

## 2018-12-31 NOTE — Progress Notes (Deleted)
[facility]  PATIENT: KANDICE SCHMELTER DOB: 06/05/1946   REASON FOR VISIT: Follow-up for obstructive sleep apnea here for  CPAP compliance HISTORY FROM: Patient   HISTORY OF PRESENT ILLNESS: Ms. Thau, 73 year old female returns for follow-up with history of obstructive sleep apnea.  She is here for her second compliance visit she had poor compliance on her last visit.  Patient states that air care cannot get a mask that fits correctly.  According to Lovena Le at [facility]  she had a mask refit in August.  Compliance data dated 07/27/2018 to 08/25/2018 shows compliance greater than 4 hours at 20%.  Average usage 4 hours 10 minutes.  Set pressure 13 cm leak 95th percentile 47.4.  AHI 19.1 ESS 12 she returns for reevaluation UPDATE 6/13/2019CM .  Ms. Rogelia Boga , 89-year-old female returns for follow-up with obstructive sleep apnea here for initial CPAP compliance.  She says she is having problems with her mask however she has not been to [facility].  CPAP data dated 03/29/2018-04/27/2018 shows compliance with 3%. Usage days less than 4 hours's at 40%. Total usage 13 out of 30 at 43%.     Average usage 1 hour 50 minutes.  Set pressure 13 EPR level 2 leak 95th percentile 54.1.  AHI 2.5 ESS 19.  She returns for reevaluation    01/27/18 CDSuzanne Darnell Level Stiger is a 73 y.o. female , today on 01-27-2018 seen in follow up. She has lost a lot of weight and looks well, reports she feels well. She proudly reports she now wears dress size 65 W for bottom and 16 for tops, down from 24. She is very pear shaped.  Mrs. Galeana is widowed, she has adult children.  Her daughter got divorced after more than 18 years of marriage and re-married recently.  In addition to her 2 grandchildren that live in [place] she also gained 2 step grandchildren. Her son also lives in [place] and has 2 additional children that are 18 and 38 years old.  She has kept active, she is well-groomed, she reports that her memory has been  stable. 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   She is not excessively daytime sleepy and only endorsed the Epworth sleepiness score at 2 points, fatigue severity, geriatric depression score, She is here mainly for CPAP compliance which has been higher in the past, this time she had only 18 days out of 30 days with over 4 hours of consecutive use ( 60% compliance).  Average use of time is 4 hours 28 minutes, set pressure of 6 cmH2O was 2 cm EPR and her residual apnea is 3.8/h.  She does seem to have higher than usual air leaks.  She has blisters on her nose form the nasal mask, and she feels she cannot make it tight enough. She has not been happy with nasal pillows in the past. I will first try another mask - may be the fit is not longer well because of weight loss, We can also order a repeat study to make sure she still has any OSA.      REVIEW OF SYSTEMS: Full 14 system review of systems performed and notable only for those listed, all others are neg:  Constitutional: neg  Cardiovascular: neg Ear/Nose/Throat: neg  Skin: neg Eyes: neg Respiratory: neg Gastroitestinal: neg  Hematology/Lymphatic: neg  Endocrine: neg Musculoskeletal:neg Allergy/Immunology: neg Neurological: neg Psychiatric: Anxiety Sleep : Obstructive sleep apnea with CPAP  ALLERGIES: Allergies  Allergen Reactions  . Aciphex [Rabeprazole Sodium] Hives  . Coumadin [Warfarin Sodium] Other (See Comments)    Bleeding   . Erythromycin Nausea And Vomiting  . Lactose Intolerance (Gi) Diarrhea    Stomach pain  . Macrodantin Nausea And Vomiting  . Nsaids     Bleeding in bladder  . Atorvastatin Other (See Comments)    Body aches  . Sulfa Antibiotics Nausea Only and Other (See Comments)    Feel terrible  . Tape Rash    HOME MEDICATIONS: Outpatient Medications Prior to Visit  Medication Sig Dispense Refill  . apixaban (ELIQUIS) 5 MG TABS tablet Take 1 tablet (5  mg total) by mouth 2 (two) times daily. 60 tablet 0  . ARTIFICIAL TEAR SOLUTION OP Place 1 drop into both eyes daily as needed (dry eyes).    Marland Kitchen buPROPion (WELLBUTRIN XL) 300 MG 24 hr tablet Take 1 tablet (300 mg total) by mouth daily. 90 tablet 3  . cetirizine (ZYRTEC) 10 MG tablet Take 10 mg by mouth daily as needed for allergies.    . clonazePAM (KLONOPIN) 0.5 MG tablet Take 1 tablet (0.5 mg total) by mouth 2 (two) times daily. 20 tablet 0  . diltiazem (CARDIZEM CD) 120 MG 24 hr capsule Take 1 capsule (120 mg total) by mouth daily.    . diphenhydrAMINE (BENADRYL) 25 mg capsule Take 1 capsule (25 mg total) by mouth every 4 (four) hours as needed for itching. 30 capsule 0  . fluticasone (FLONASE) 50 MCG/ACT nasal spray Place 1 spray into both nostrils daily as needed for allergies.     . furosemide (LASIX) 40 MG tablet Take 1 tablet (40 mg total) by mouth daily. 30 tablet   . hydrocortisone cream 1 % Apply topically 3 (three) times daily. Apply on the rashes 30 g 0  . levothyroxine (SYNTHROID, LEVOTHROID) 25 MCG tablet Take 25 mcg by mouth daily before breakfast.     . metFORMIN (GLUCOPHAGE-XR) 500 MG 24 hr tablet Take 500 mg by mouth 2 (two) times daily.    . metoprolol tartrate (LOPRESSOR) 50 MG tablet Take 1 tablet (50 mg total) by mouth 2 (two) times daily.    . montelukast (SINGULAIR) 10 MG tablet Take 10 mg by mouth daily as needed (allergies).   1  . omeprazole (PRILOSEC) 20 MG capsule Take 20 mg by mouth every evening.    Marland Kitchen oxybutynin (DITROPAN-XL) 10 MG 24 hr tablet Take 10 mg by mouth every morning.   11  . pravastatin (PRAVACHOL) 40 MG tablet Take 40 mg by mouth at bedtime.     . traZODone (DESYREL) 100 MG tablet Take 100 mg by mouth at bedtime.     Marland Kitchen venlafaxine (EFFEXOR) 50 MG tablet Take 2 tablets (100 mg total) by mouth daily. 90 tablet 3   No facility-administered medications prior to visit.     PAST MEDICAL HISTORY: Past Medical History:  Diagnosis Date  . Acute liver  failure 11/2018  . AKI (acute kidney injury) ([place])   . Anemia due to blood loss   . Anxiety   . Arthritis   . Asthma    seasonal or with bronchitis  . Complication of anesthesia    "forgets to breathe" when she wakes up  . Depression   . Diabetes type 2, controlled ([place])    fasting 100-120  . GERD (gastroesophageal reflux disease)   . Headache   . Hepatitis    A -- as teenager  . Hyperlipidemia   .  Hypertension   . Hypothyroidism   . Mitral valve prolapse   . OSA on CPAP   . Persistent atrial fibrillation   . Pneumonia    hx of  . Severe mitral regurgitation   . Vertigo     PAST SURGICAL HISTORY: Past Surgical History:  Procedure Laterality Date  . ABDOMINAL HYSTERECTOMY    . CARDIOVERSION N/A 10/11/2018   Procedure: CARDIOVERSION;  Surgeon: Dorothy Spark, MD;  Location: [facility] ENDOSCOPY;  Service: Cardiovascular;  Laterality: N/A;  . CHOLECYSTECTOMY    . FRACTURE SURGERY Right    leg  . GALLBLADDER SURGERY  1995  . JOINT REPLACEMENT Left    knee  . NASAL SINUS SURGERY  2005  . PARTIAL HYSTERECTOMY  1983  . TEE WITHOUT CARDIOVERSION N/A 10/11/2018   Procedure: TRANSESOPHAGEAL ECHOCARDIOGRAM (TEE);  Surgeon: Dorothy Spark, MD;  Location: [facility] ENDOSCOPY;  Service: Cardiovascular;  Laterality: N/A;  . THROAT SURGERY  2005  . TOTAL KNEE ARTHROPLASTY Right 09/12/2014   Procedure: TOTAL KNEE ARTHROPLASTY;  Surgeon: Meredith Pel, MD;  Location: [place];  Service: Orthopedics;  Laterality: Right;    FAMILY HISTORY: Family History  Problem Relation Age of Onset  . Cancer Mother        panceratic  . Cancer - Colon Mother   . Breast cancer Maternal Aunt     SOCIAL HISTORY: Social History   Socioeconomic History  . Marital status: Married    Spouse name: Not on file  . Number of children: Not on file  . Years of education: Not on file  . Highest education level: Not on file  Occupational History  . Occupation: retired    Comment: Secondary school teacher    Social Needs  . Financial resource strain: Not on file  . Food insecurity:    Worry: Not on file    Inability: Not on file  . Transportation needs:    Medical: Not on file    Non-medical: Not on file  Tobacco Use  . Smoking status: Never Smoker  . Smokeless tobacco: Never Used  Substance and Sexual Activity  . Alcohol use: No    Comment: socially  . Drug use: No  . Sexual activity: Not on file  Lifestyle  . Physical activity:    Days per week: Not on file    Minutes per session: Not on file  . Stress: Not on file  Relationships  . Social connections:    Talks on phone: Not on file    Gets together: Not on file    Attends religious service: Not on file    Active member of club or organization: Not on file    Attends meetings of clubs or organizations: Not on file    Relationship status: Not on file  . Intimate partner violence:    Fear of current or ex partner: Not on file    Emotionally abused: Not on file    Physically abused: Not on file    Forced sexual activity: Not on file  Other Topics Concern  . Not on file  Social History Narrative  . Not on file     PHYSICAL EXAM  There were no vitals filed for this visit. There is no height or weight on file to calculate BMI.  Generalized: Well developed, in no acute distress  Head: normocephalic and atraumatic,. Oropharynx benign mallopatti 3 Neck: Supple, circumference 15 Lungs: Clear Musculoskeletal: No deformity  Skin no edema  Neurological examination   Mentation: Alert  oriented to time, place, history taking. Attention span and concentration appropriate. Recent and remote memory intact.  Follows all commands speech and language fluent.   Cranial nerve II-XII: Pupils were equal round reactive to light extraocular movements were full, visual field were full on confrontational test. Facial sensation and strength were normal. hearing was intact to finger rubbing bilaterally. Uvula tongue midline. head turning and  shoulder shrug were normal and symmetric.Tongue protrusion into cheek strength was normal. Motor: normal bulk and tone, full strength in the BUE, BLE,  Sensory: normal and symmetric to light touch,   Coordination: finger-nose-finger, heel-to-shin bilaterally, no dysmetria Gait and Station: Rising up from seated position without assistance, normal stance,  moderate stride, good arm swing, smooth turning, able to perform tiptoe, and heel walking without difficulty. Tandem gait is steady.  No assistive device  DIAGNOSTIC DATA (LABS, IMAGING, TESTING) - I reviewed patient records, labs, notes, testing and imaging myself where available.  Lab Results  Component Value Date   WBC 11.6 (H) 12/16/2018   HGB 11.7 (L) 12/16/2018   HCT 38.0 12/16/2018   MCV 90.0 12/16/2018   PLT 150 12/16/2018      Component Value Date/Time   NA 135 12/18/2018 0625   K 3.6 12/18/2018 0625   CL 106 12/18/2018 0625   CO2 21 (L) 12/18/2018 0625   GLUCOSE 209 (H) 12/18/2018 0625   BUN 17 12/18/2018 0625   CREATININE 0.87 12/18/2018 0625   CALCIUM 8.7 (L) 12/18/2018 0625   PROT 4.8 (L) 12/17/2018 0613   ALBUMIN 2.5 (L) 12/17/2018 0613   AST 35 12/17/2018 0613   ALT 183 (H) 12/17/2018 0613   ALKPHOS 57 12/17/2018 0613   BILITOT 1.1 12/17/2018 0613   GFRNONAA >60 12/18/2018 0625   GFRAA >60 12/18/2018 7169    Lab Results  Component Value Date   HGBA1C 6.5 (H) 12/20/2018    ASSESSMENT AND PLAN  73 y.o. year old female  has a past medical history of OSA on CPAP, here for CPAP compliance.CPAP data dated 07/27/2018 to 08/25/2018 shows compliance greater than 4 hours at 20%.  Average usage 4 hours 10 minutes.  Set pressure 13 cm leak 95th percentile 47.4.  AHI 19.1 ESS 12   CPAP compliance 20% greater than 4 hours Needs mask refit spoke to [place] at [facility] same settings  Follow up in 4 months Dennie Bible, [facility], [facility], [facility] [address], [place],  [address] [phone]

## 2019-01-03 ENCOUNTER — Ambulatory Visit: Payer: Medicare Other | Admitting: Nurse Practitioner

## 2019-01-03 ENCOUNTER — Telehealth: Payer: Self-pay | Admitting: *Deleted

## 2019-01-03 ENCOUNTER — Encounter: Payer: Self-pay | Admitting: Physician Assistant

## 2019-01-03 NOTE — Telephone Encounter (Signed)
Pt no showed for appt today.  

## 2019-01-03 NOTE — Telephone Encounter (Signed)
No showed for appt today.

## 2019-01-03 NOTE — Telephone Encounter (Signed)
LMVM for pt to return call prior to coming to appt today.  Cpap compliance reports not using since end of November.

## 2019-01-04 ENCOUNTER — Encounter: Payer: Self-pay | Admitting: Nurse Practitioner

## 2019-02-02 ENCOUNTER — Encounter: Payer: Self-pay | Admitting: Cardiology

## 2019-02-03 ENCOUNTER — Telehealth: Payer: Self-pay | Admitting: Cardiology

## 2019-02-03 NOTE — Telephone Encounter (Signed)
Please bring her as scheduled for 02/11/2019 as she was just discharged from the hospital.   Ena Dawley, MD 02/03/2019

## 2019-02-04 NOTE — Telephone Encounter (Signed)
Left a detailed message on the pts VM to call the office back, for follow-up of upcoming appt with Dr Meda Coffee on next Friday 3/27. Pt will need to keep her appt per Dr Meda Coffee and [phone] screening questions will need to be asked, when pt returns a call back.

## 2019-02-07 NOTE — Telephone Encounter (Signed)
Left the pt a message to call the office back in regards to her upcoming appt with Dr Meda Coffee on this Friday 02/11/19 at 1020.  Left a detailed message for the pt to call back, so that we can discuss with her in more details, that Dr Meda Coffee would like for her to be a TELEVISIT vs coming into the office, on 02/11/19 at 1020, due to covid19 situation.

## 2019-02-10 NOTE — Telephone Encounter (Signed)
Left the pt several voicemail's to call the office back to discuss her appt scheduled for 3/27 with Dr Meda Coffee at 1020.  Pt has not returned a call back.  Advised the pt to call the office back tomorrow and request to speak with myself, so that we can arrange for her appt to be telephone visit or Webex visit with Dr Meda Coffee tomorrow.

## 2019-02-11 ENCOUNTER — Telehealth: Payer: Medicare Other | Admitting: Cardiology

## 2019-02-11 ENCOUNTER — Other Ambulatory Visit: Payer: Self-pay

## 2019-02-11 NOTE — Telephone Encounter (Addendum)
Pt never called back today for her scheduled telehealth appt with Dr Meda Coffee.  Multiple attempts were made at trying to make contact with the pt.

## 2020-11-21 DIAGNOSIS — M21961 Unspecified acquired deformity of right lower leg: Secondary | ICD-10-CM | POA: Diagnosis not present

## 2020-11-21 DIAGNOSIS — M79661 Pain in right lower leg: Secondary | ICD-10-CM | POA: Diagnosis not present

## 2020-11-21 DIAGNOSIS — M7741 Metatarsalgia, right foot: Secondary | ICD-10-CM | POA: Diagnosis not present

## 2020-11-27 DIAGNOSIS — K219 Gastro-esophageal reflux disease without esophagitis: Secondary | ICD-10-CM | POA: Diagnosis not present

## 2020-11-27 DIAGNOSIS — I5042 Chronic combined systolic (congestive) and diastolic (congestive) heart failure: Secondary | ICD-10-CM | POA: Diagnosis not present

## 2020-11-27 DIAGNOSIS — I10 Essential (primary) hypertension: Secondary | ICD-10-CM | POA: Diagnosis not present

## 2020-11-27 DIAGNOSIS — G4739 Other sleep apnea: Secondary | ICD-10-CM | POA: Diagnosis not present

## 2020-11-27 DIAGNOSIS — E559 Vitamin D deficiency, unspecified: Secondary | ICD-10-CM | POA: Diagnosis not present

## 2020-11-27 DIAGNOSIS — E038 Other specified hypothyroidism: Secondary | ICD-10-CM | POA: Diagnosis not present

## 2020-11-27 DIAGNOSIS — E7849 Other hyperlipidemia: Secondary | ICD-10-CM | POA: Diagnosis not present

## 2020-11-27 DIAGNOSIS — E119 Type 2 diabetes mellitus without complications: Secondary | ICD-10-CM | POA: Diagnosis not present

## 2020-11-27 DIAGNOSIS — G4733 Obstructive sleep apnea (adult) (pediatric): Secondary | ICD-10-CM | POA: Diagnosis not present

## 2020-11-27 DIAGNOSIS — R2689 Other abnormalities of gait and mobility: Secondary | ICD-10-CM | POA: Diagnosis not present

## 2020-11-27 DIAGNOSIS — I480 Paroxysmal atrial fibrillation: Secondary | ICD-10-CM | POA: Diagnosis not present

## 2020-11-27 DIAGNOSIS — Z79899 Other long term (current) drug therapy: Secondary | ICD-10-CM | POA: Diagnosis not present

## 2020-12-10 DIAGNOSIS — M79661 Pain in right lower leg: Secondary | ICD-10-CM | POA: Diagnosis not present

## 2020-12-10 DIAGNOSIS — M79662 Pain in left lower leg: Secondary | ICD-10-CM | POA: Diagnosis not present

## 2020-12-10 DIAGNOSIS — B351 Tinea unguium: Secondary | ICD-10-CM | POA: Diagnosis not present

## 2020-12-18 DIAGNOSIS — G4733 Obstructive sleep apnea (adult) (pediatric): Secondary | ICD-10-CM | POA: Diagnosis not present

## 2020-12-18 DIAGNOSIS — I10 Essential (primary) hypertension: Secondary | ICD-10-CM | POA: Diagnosis not present

## 2020-12-18 DIAGNOSIS — E7849 Other hyperlipidemia: Secondary | ICD-10-CM | POA: Diagnosis not present

## 2020-12-18 DIAGNOSIS — R2689 Other abnormalities of gait and mobility: Secondary | ICD-10-CM | POA: Diagnosis not present

## 2020-12-18 DIAGNOSIS — K219 Gastro-esophageal reflux disease without esophagitis: Secondary | ICD-10-CM | POA: Diagnosis not present

## 2020-12-18 DIAGNOSIS — G464 Cerebellar stroke syndrome: Secondary | ICD-10-CM | POA: Diagnosis not present

## 2020-12-18 DIAGNOSIS — I480 Paroxysmal atrial fibrillation: Secondary | ICD-10-CM | POA: Diagnosis not present

## 2020-12-18 DIAGNOSIS — E038 Other specified hypothyroidism: Secondary | ICD-10-CM | POA: Diagnosis not present

## 2020-12-18 DIAGNOSIS — E0789 Other specified disorders of thyroid: Secondary | ICD-10-CM | POA: Diagnosis not present

## 2020-12-18 DIAGNOSIS — M6281 Muscle weakness (generalized): Secondary | ICD-10-CM | POA: Diagnosis not present

## 2020-12-18 DIAGNOSIS — E119 Type 2 diabetes mellitus without complications: Secondary | ICD-10-CM | POA: Diagnosis not present

## 2020-12-19 DIAGNOSIS — Z86010 Personal history of colonic polyps: Secondary | ICD-10-CM | POA: Diagnosis not present

## 2020-12-19 DIAGNOSIS — K5900 Constipation, unspecified: Secondary | ICD-10-CM | POA: Diagnosis not present

## 2020-12-19 DIAGNOSIS — R140 Abdominal distension (gaseous): Secondary | ICD-10-CM | POA: Diagnosis not present

## 2021-01-10 DIAGNOSIS — I480 Paroxysmal atrial fibrillation: Secondary | ICD-10-CM | POA: Diagnosis not present

## 2021-01-10 DIAGNOSIS — E7849 Other hyperlipidemia: Secondary | ICD-10-CM | POA: Diagnosis not present

## 2021-01-10 DIAGNOSIS — I10 Essential (primary) hypertension: Secondary | ICD-10-CM | POA: Diagnosis not present

## 2021-01-10 DIAGNOSIS — K219 Gastro-esophageal reflux disease without esophagitis: Secondary | ICD-10-CM | POA: Diagnosis not present

## 2021-01-10 DIAGNOSIS — E119 Type 2 diabetes mellitus without complications: Secondary | ICD-10-CM | POA: Diagnosis not present

## 2021-01-10 DIAGNOSIS — G4739 Other sleep apnea: Secondary | ICD-10-CM | POA: Diagnosis not present

## 2021-01-10 DIAGNOSIS — R2689 Other abnormalities of gait and mobility: Secondary | ICD-10-CM | POA: Diagnosis not present

## 2021-01-10 DIAGNOSIS — E038 Other specified hypothyroidism: Secondary | ICD-10-CM | POA: Diagnosis not present

## 2021-01-28 DIAGNOSIS — M2041 Other hammer toe(s) (acquired), right foot: Secondary | ICD-10-CM | POA: Diagnosis not present

## 2021-01-28 DIAGNOSIS — M79661 Pain in right lower leg: Secondary | ICD-10-CM | POA: Diagnosis not present

## 2021-01-28 DIAGNOSIS — M2011 Hallux valgus (acquired), right foot: Secondary | ICD-10-CM | POA: Diagnosis not present

## 2021-01-30 DIAGNOSIS — H04123 Dry eye syndrome of bilateral lacrimal glands: Secondary | ICD-10-CM | POA: Diagnosis not present

## 2021-01-30 DIAGNOSIS — E7849 Other hyperlipidemia: Secondary | ICD-10-CM | POA: Diagnosis not present

## 2021-01-30 DIAGNOSIS — R2689 Other abnormalities of gait and mobility: Secondary | ICD-10-CM | POA: Diagnosis not present

## 2021-01-30 DIAGNOSIS — I10 Essential (primary) hypertension: Secondary | ICD-10-CM | POA: Diagnosis not present

## 2021-01-30 DIAGNOSIS — E119 Type 2 diabetes mellitus without complications: Secondary | ICD-10-CM | POA: Diagnosis not present

## 2021-01-30 DIAGNOSIS — H6123 Impacted cerumen, bilateral: Secondary | ICD-10-CM | POA: Diagnosis not present

## 2021-01-30 DIAGNOSIS — I480 Paroxysmal atrial fibrillation: Secondary | ICD-10-CM | POA: Diagnosis not present

## 2021-01-30 DIAGNOSIS — R42 Dizziness and giddiness: Secondary | ICD-10-CM | POA: Diagnosis not present

## 2021-01-30 DIAGNOSIS — I5042 Chronic combined systolic (congestive) and diastolic (congestive) heart failure: Secondary | ICD-10-CM | POA: Diagnosis not present

## 2021-01-30 DIAGNOSIS — E038 Other specified hypothyroidism: Secondary | ICD-10-CM | POA: Diagnosis not present

## 2021-02-04 DIAGNOSIS — Z1211 Encounter for screening for malignant neoplasm of colon: Secondary | ICD-10-CM | POA: Diagnosis not present

## 2021-02-04 DIAGNOSIS — Z86010 Personal history of colonic polyps: Secondary | ICD-10-CM | POA: Diagnosis not present

## 2021-03-06 DIAGNOSIS — R140 Abdominal distension (gaseous): Secondary | ICD-10-CM | POA: Diagnosis not present

## 2021-03-06 DIAGNOSIS — K219 Gastro-esophageal reflux disease without esophagitis: Secondary | ICD-10-CM | POA: Diagnosis not present

## 2021-03-06 DIAGNOSIS — K5900 Constipation, unspecified: Secondary | ICD-10-CM | POA: Diagnosis not present

## 2021-03-07 DIAGNOSIS — H903 Sensorineural hearing loss, bilateral: Secondary | ICD-10-CM | POA: Diagnosis not present

## 2021-03-07 DIAGNOSIS — H6122 Impacted cerumen, left ear: Secondary | ICD-10-CM | POA: Diagnosis not present

## 2021-03-15 DIAGNOSIS — M6281 Muscle weakness (generalized): Secondary | ICD-10-CM | POA: Diagnosis not present

## 2021-03-15 DIAGNOSIS — I480 Paroxysmal atrial fibrillation: Secondary | ICD-10-CM | POA: Diagnosis not present

## 2021-03-15 DIAGNOSIS — I5042 Chronic combined systolic (congestive) and diastolic (congestive) heart failure: Secondary | ICD-10-CM | POA: Diagnosis not present

## 2021-03-15 DIAGNOSIS — I10 Essential (primary) hypertension: Secondary | ICD-10-CM | POA: Diagnosis not present

## 2021-03-15 DIAGNOSIS — E7849 Other hyperlipidemia: Secondary | ICD-10-CM | POA: Diagnosis not present

## 2021-03-15 DIAGNOSIS — R2689 Other abnormalities of gait and mobility: Secondary | ICD-10-CM | POA: Diagnosis not present

## 2021-03-15 DIAGNOSIS — G4733 Obstructive sleep apnea (adult) (pediatric): Secondary | ICD-10-CM | POA: Diagnosis not present

## 2021-03-15 DIAGNOSIS — E038 Other specified hypothyroidism: Secondary | ICD-10-CM | POA: Diagnosis not present

## 2021-03-15 DIAGNOSIS — G4739 Other sleep apnea: Secondary | ICD-10-CM | POA: Diagnosis not present

## 2021-03-15 DIAGNOSIS — K219 Gastro-esophageal reflux disease without esophagitis: Secondary | ICD-10-CM | POA: Diagnosis not present

## 2021-03-20 DIAGNOSIS — Z79899 Other long term (current) drug therapy: Secondary | ICD-10-CM | POA: Diagnosis not present

## 2021-03-28 DIAGNOSIS — K5901 Slow transit constipation: Secondary | ICD-10-CM | POA: Diagnosis not present

## 2021-04-02 DIAGNOSIS — E038 Other specified hypothyroidism: Secondary | ICD-10-CM | POA: Diagnosis not present

## 2021-04-02 DIAGNOSIS — K219 Gastro-esophageal reflux disease without esophagitis: Secondary | ICD-10-CM | POA: Diagnosis not present

## 2021-04-02 DIAGNOSIS — I10 Essential (primary) hypertension: Secondary | ICD-10-CM | POA: Diagnosis not present

## 2021-04-02 DIAGNOSIS — I5042 Chronic combined systolic (congestive) and diastolic (congestive) heart failure: Secondary | ICD-10-CM | POA: Diagnosis not present

## 2021-04-02 DIAGNOSIS — R2689 Other abnormalities of gait and mobility: Secondary | ICD-10-CM | POA: Diagnosis not present

## 2021-04-02 DIAGNOSIS — G4733 Obstructive sleep apnea (adult) (pediatric): Secondary | ICD-10-CM | POA: Diagnosis not present

## 2021-04-02 DIAGNOSIS — G4739 Other sleep apnea: Secondary | ICD-10-CM | POA: Diagnosis not present

## 2021-04-02 DIAGNOSIS — E7849 Other hyperlipidemia: Secondary | ICD-10-CM | POA: Diagnosis not present

## 2021-04-02 DIAGNOSIS — I480 Paroxysmal atrial fibrillation: Secondary | ICD-10-CM | POA: Diagnosis not present

## 2021-04-02 DIAGNOSIS — E119 Type 2 diabetes mellitus without complications: Secondary | ICD-10-CM | POA: Diagnosis not present

## 2021-04-08 DIAGNOSIS — M79661 Pain in right lower leg: Secondary | ICD-10-CM | POA: Diagnosis not present

## 2021-04-08 DIAGNOSIS — M79662 Pain in left lower leg: Secondary | ICD-10-CM | POA: Diagnosis not present

## 2021-04-08 DIAGNOSIS — M2041 Other hammer toe(s) (acquired), right foot: Secondary | ICD-10-CM | POA: Diagnosis not present

## 2021-04-08 DIAGNOSIS — M2042 Other hammer toe(s) (acquired), left foot: Secondary | ICD-10-CM | POA: Diagnosis not present

## 2021-04-08 DIAGNOSIS — B351 Tinea unguium: Secondary | ICD-10-CM | POA: Diagnosis not present

## 2021-04-16 DIAGNOSIS — K219 Gastro-esophageal reflux disease without esophagitis: Secondary | ICD-10-CM | POA: Diagnosis not present

## 2021-04-16 DIAGNOSIS — I10 Essential (primary) hypertension: Secondary | ICD-10-CM | POA: Diagnosis not present

## 2021-04-16 DIAGNOSIS — E7849 Other hyperlipidemia: Secondary | ICD-10-CM | POA: Diagnosis not present

## 2021-04-16 DIAGNOSIS — E038 Other specified hypothyroidism: Secondary | ICD-10-CM | POA: Diagnosis not present

## 2021-04-16 DIAGNOSIS — R2689 Other abnormalities of gait and mobility: Secondary | ICD-10-CM | POA: Diagnosis not present

## 2021-04-16 DIAGNOSIS — I5042 Chronic combined systolic (congestive) and diastolic (congestive) heart failure: Secondary | ICD-10-CM | POA: Diagnosis not present

## 2021-04-16 DIAGNOSIS — E119 Type 2 diabetes mellitus without complications: Secondary | ICD-10-CM | POA: Diagnosis not present

## 2021-04-16 DIAGNOSIS — G4739 Other sleep apnea: Secondary | ICD-10-CM | POA: Diagnosis not present

## 2021-04-16 DIAGNOSIS — I480 Paroxysmal atrial fibrillation: Secondary | ICD-10-CM | POA: Diagnosis not present

## 2021-04-16 DIAGNOSIS — G4733 Obstructive sleep apnea (adult) (pediatric): Secondary | ICD-10-CM | POA: Diagnosis not present

## 2021-05-06 DIAGNOSIS — I10 Essential (primary) hypertension: Secondary | ICD-10-CM | POA: Diagnosis not present

## 2021-05-06 DIAGNOSIS — E119 Type 2 diabetes mellitus without complications: Secondary | ICD-10-CM | POA: Diagnosis not present

## 2021-05-06 DIAGNOSIS — I5042 Chronic combined systolic (congestive) and diastolic (congestive) heart failure: Secondary | ICD-10-CM | POA: Diagnosis not present

## 2021-05-06 DIAGNOSIS — R42 Dizziness and giddiness: Secondary | ICD-10-CM | POA: Diagnosis not present

## 2021-05-06 DIAGNOSIS — G4739 Other sleep apnea: Secondary | ICD-10-CM | POA: Diagnosis not present

## 2021-05-06 DIAGNOSIS — E7849 Other hyperlipidemia: Secondary | ICD-10-CM | POA: Diagnosis not present

## 2021-05-06 DIAGNOSIS — I480 Paroxysmal atrial fibrillation: Secondary | ICD-10-CM | POA: Diagnosis not present

## 2021-05-06 DIAGNOSIS — H04123 Dry eye syndrome of bilateral lacrimal glands: Secondary | ICD-10-CM | POA: Diagnosis not present

## 2021-05-06 DIAGNOSIS — K219 Gastro-esophageal reflux disease without esophagitis: Secondary | ICD-10-CM | POA: Diagnosis not present

## 2021-05-06 DIAGNOSIS — H6123 Impacted cerumen, bilateral: Secondary | ICD-10-CM | POA: Diagnosis not present

## 2021-05-06 DIAGNOSIS — Z0001 Encounter for general adult medical examination with abnormal findings: Secondary | ICD-10-CM | POA: Diagnosis not present

## 2021-05-07 DIAGNOSIS — R2689 Other abnormalities of gait and mobility: Secondary | ICD-10-CM | POA: Diagnosis not present

## 2021-05-07 DIAGNOSIS — G4739 Other sleep apnea: Secondary | ICD-10-CM | POA: Diagnosis not present

## 2021-05-07 DIAGNOSIS — I10 Essential (primary) hypertension: Secondary | ICD-10-CM | POA: Diagnosis not present

## 2021-05-07 DIAGNOSIS — E7849 Other hyperlipidemia: Secondary | ICD-10-CM | POA: Diagnosis not present

## 2021-05-07 DIAGNOSIS — E038 Other specified hypothyroidism: Secondary | ICD-10-CM | POA: Diagnosis not present

## 2021-05-07 DIAGNOSIS — G4733 Obstructive sleep apnea (adult) (pediatric): Secondary | ICD-10-CM | POA: Diagnosis not present

## 2021-05-07 DIAGNOSIS — I5042 Chronic combined systolic (congestive) and diastolic (congestive) heart failure: Secondary | ICD-10-CM | POA: Diagnosis not present

## 2021-05-07 DIAGNOSIS — K219 Gastro-esophageal reflux disease without esophagitis: Secondary | ICD-10-CM | POA: Diagnosis not present

## 2021-05-07 DIAGNOSIS — E119 Type 2 diabetes mellitus without complications: Secondary | ICD-10-CM | POA: Diagnosis not present

## 2021-05-07 DIAGNOSIS — I480 Paroxysmal atrial fibrillation: Secondary | ICD-10-CM | POA: Diagnosis not present

## 2021-05-22 DIAGNOSIS — E119 Type 2 diabetes mellitus without complications: Secondary | ICD-10-CM | POA: Diagnosis not present

## 2021-05-22 DIAGNOSIS — I6350 Cerebral infarction due to unspecified occlusion or stenosis of unspecified cerebral artery: Secondary | ICD-10-CM | POA: Diagnosis not present

## 2021-05-22 DIAGNOSIS — I10 Essential (primary) hypertension: Secondary | ICD-10-CM | POA: Diagnosis not present

## 2021-05-22 DIAGNOSIS — G4733 Obstructive sleep apnea (adult) (pediatric): Secondary | ICD-10-CM | POA: Diagnosis not present

## 2021-06-06 DIAGNOSIS — R69 Illness, unspecified: Secondary | ICD-10-CM | POA: Diagnosis not present

## 2021-06-06 DIAGNOSIS — K219 Gastro-esophageal reflux disease without esophagitis: Secondary | ICD-10-CM | POA: Diagnosis not present

## 2021-06-06 DIAGNOSIS — Z79899 Other long term (current) drug therapy: Secondary | ICD-10-CM | POA: Diagnosis not present

## 2021-06-06 DIAGNOSIS — E079 Disorder of thyroid, unspecified: Secondary | ICD-10-CM | POA: Diagnosis not present

## 2021-06-06 DIAGNOSIS — Z882 Allergy status to sulfonamides status: Secondary | ICD-10-CM | POA: Diagnosis not present

## 2021-06-06 DIAGNOSIS — Z7982 Long term (current) use of aspirin: Secondary | ICD-10-CM | POA: Diagnosis not present

## 2021-06-06 DIAGNOSIS — S50312A Abrasion of left elbow, initial encounter: Secondary | ICD-10-CM | POA: Diagnosis not present

## 2021-06-06 DIAGNOSIS — M25522 Pain in left elbow: Secondary | ICD-10-CM | POA: Diagnosis not present

## 2021-06-06 DIAGNOSIS — Z881 Allergy status to other antibiotic agents status: Secondary | ICD-10-CM | POA: Diagnosis not present

## 2021-06-06 DIAGNOSIS — Z888 Allergy status to other drugs, medicaments and biological substances status: Secondary | ICD-10-CM | POA: Diagnosis not present

## 2021-06-06 DIAGNOSIS — Z886 Allergy status to analgesic agent status: Secondary | ICD-10-CM | POA: Diagnosis not present

## 2021-06-06 DIAGNOSIS — S59902A Unspecified injury of left elbow, initial encounter: Secondary | ICD-10-CM | POA: Diagnosis not present

## 2021-06-06 DIAGNOSIS — I10 Essential (primary) hypertension: Secondary | ICD-10-CM | POA: Diagnosis not present

## 2021-06-06 DIAGNOSIS — S0990XA Unspecified injury of head, initial encounter: Secondary | ICD-10-CM | POA: Diagnosis not present

## 2021-06-06 DIAGNOSIS — E119 Type 2 diabetes mellitus without complications: Secondary | ICD-10-CM | POA: Diagnosis not present

## 2021-06-06 DIAGNOSIS — Z7901 Long term (current) use of anticoagulants: Secondary | ICD-10-CM | POA: Diagnosis not present

## 2021-06-06 DIAGNOSIS — I4891 Unspecified atrial fibrillation: Secondary | ICD-10-CM | POA: Diagnosis not present

## 2021-06-06 DIAGNOSIS — R519 Headache, unspecified: Secondary | ICD-10-CM | POA: Diagnosis not present

## 2021-06-06 DIAGNOSIS — E785 Hyperlipidemia, unspecified: Secondary | ICD-10-CM | POA: Diagnosis not present

## 2021-06-18 DIAGNOSIS — I10 Essential (primary) hypertension: Secondary | ICD-10-CM | POA: Diagnosis not present

## 2021-06-18 DIAGNOSIS — Z79899 Other long term (current) drug therapy: Secondary | ICD-10-CM | POA: Diagnosis not present

## 2021-06-18 DIAGNOSIS — E119 Type 2 diabetes mellitus without complications: Secondary | ICD-10-CM | POA: Diagnosis not present

## 2021-06-18 DIAGNOSIS — N3281 Overactive bladder: Secondary | ICD-10-CM | POA: Diagnosis not present

## 2021-06-18 DIAGNOSIS — I5042 Chronic combined systolic (congestive) and diastolic (congestive) heart failure: Secondary | ICD-10-CM | POA: Diagnosis not present

## 2021-07-05 DIAGNOSIS — K5901 Slow transit constipation: Secondary | ICD-10-CM | POA: Diagnosis not present

## 2021-07-05 DIAGNOSIS — R140 Abdominal distension (gaseous): Secondary | ICD-10-CM | POA: Diagnosis not present

## 2021-07-05 DIAGNOSIS — K219 Gastro-esophageal reflux disease without esophagitis: Secondary | ICD-10-CM | POA: Diagnosis not present

## 2021-07-08 DIAGNOSIS — M2042 Other hammer toe(s) (acquired), left foot: Secondary | ICD-10-CM | POA: Diagnosis not present

## 2021-07-08 DIAGNOSIS — M79662 Pain in left lower leg: Secondary | ICD-10-CM | POA: Diagnosis not present

## 2021-07-08 DIAGNOSIS — M2041 Other hammer toe(s) (acquired), right foot: Secondary | ICD-10-CM | POA: Diagnosis not present

## 2021-07-08 DIAGNOSIS — M79661 Pain in right lower leg: Secondary | ICD-10-CM | POA: Diagnosis not present

## 2021-07-10 DIAGNOSIS — R296 Repeated falls: Secondary | ICD-10-CM | POA: Diagnosis not present

## 2021-07-10 DIAGNOSIS — R2681 Unsteadiness on feet: Secondary | ICD-10-CM | POA: Diagnosis not present

## 2021-07-10 DIAGNOSIS — M6281 Muscle weakness (generalized): Secondary | ICD-10-CM | POA: Diagnosis not present

## 2021-07-15 DIAGNOSIS — R296 Repeated falls: Secondary | ICD-10-CM | POA: Diagnosis not present

## 2021-07-15 DIAGNOSIS — M6281 Muscle weakness (generalized): Secondary | ICD-10-CM | POA: Diagnosis not present

## 2021-07-15 DIAGNOSIS — R2681 Unsteadiness on feet: Secondary | ICD-10-CM | POA: Diagnosis not present

## 2021-07-19 DIAGNOSIS — M6281 Muscle weakness (generalized): Secondary | ICD-10-CM | POA: Diagnosis not present

## 2021-07-19 DIAGNOSIS — I517 Cardiomegaly: Secondary | ICD-10-CM | POA: Diagnosis not present

## 2021-07-19 DIAGNOSIS — Z7901 Long term (current) use of anticoagulants: Secondary | ICD-10-CM | POA: Diagnosis not present

## 2021-07-19 DIAGNOSIS — E782 Mixed hyperlipidemia: Secondary | ICD-10-CM | POA: Diagnosis not present

## 2021-07-19 DIAGNOSIS — R296 Repeated falls: Secondary | ICD-10-CM | POA: Diagnosis not present

## 2021-07-19 DIAGNOSIS — I5032 Chronic diastolic (congestive) heart failure: Secondary | ICD-10-CM | POA: Diagnosis not present

## 2021-07-19 DIAGNOSIS — N183 Chronic kidney disease, stage 3 unspecified: Secondary | ICD-10-CM | POA: Diagnosis not present

## 2021-07-19 DIAGNOSIS — R2681 Unsteadiness on feet: Secondary | ICD-10-CM | POA: Diagnosis not present

## 2021-07-19 DIAGNOSIS — I2510 Atherosclerotic heart disease of native coronary artery without angina pectoris: Secondary | ICD-10-CM | POA: Diagnosis not present

## 2021-07-19 DIAGNOSIS — I110 Hypertensive heart disease with heart failure: Secondary | ICD-10-CM | POA: Diagnosis not present

## 2021-07-19 DIAGNOSIS — I272 Pulmonary hypertension, unspecified: Secondary | ICD-10-CM | POA: Diagnosis not present

## 2021-07-19 DIAGNOSIS — I4821 Permanent atrial fibrillation: Secondary | ICD-10-CM | POA: Diagnosis not present

## 2021-07-19 DIAGNOSIS — E119 Type 2 diabetes mellitus without complications: Secondary | ICD-10-CM | POA: Diagnosis not present

## 2021-07-19 DIAGNOSIS — I10 Essential (primary) hypertension: Secondary | ICD-10-CM | POA: Diagnosis not present

## 2021-07-19 DIAGNOSIS — I358 Other nonrheumatic aortic valve disorders: Secondary | ICD-10-CM | POA: Diagnosis not present

## 2021-07-22 DIAGNOSIS — R296 Repeated falls: Secondary | ICD-10-CM | POA: Diagnosis not present

## 2021-07-22 DIAGNOSIS — M6281 Muscle weakness (generalized): Secondary | ICD-10-CM | POA: Diagnosis not present

## 2021-07-22 DIAGNOSIS — R2681 Unsteadiness on feet: Secondary | ICD-10-CM | POA: Diagnosis not present

## 2021-07-23 DIAGNOSIS — M170 Bilateral primary osteoarthritis of knee: Secondary | ICD-10-CM | POA: Diagnosis not present

## 2021-07-23 DIAGNOSIS — Z79899 Other long term (current) drug therapy: Secondary | ICD-10-CM | POA: Diagnosis not present

## 2021-07-23 DIAGNOSIS — Z9181 History of falling: Secondary | ICD-10-CM | POA: Diagnosis not present

## 2021-07-23 DIAGNOSIS — I5042 Chronic combined systolic (congestive) and diastolic (congestive) heart failure: Secondary | ICD-10-CM | POA: Diagnosis not present

## 2021-07-23 DIAGNOSIS — R296 Repeated falls: Secondary | ICD-10-CM | POA: Diagnosis not present

## 2021-07-23 DIAGNOSIS — R2689 Other abnormalities of gait and mobility: Secondary | ICD-10-CM | POA: Diagnosis not present

## 2021-07-23 DIAGNOSIS — I10 Essential (primary) hypertension: Secondary | ICD-10-CM | POA: Diagnosis not present

## 2021-07-23 DIAGNOSIS — N3281 Overactive bladder: Secondary | ICD-10-CM | POA: Diagnosis not present

## 2021-07-23 DIAGNOSIS — E119 Type 2 diabetes mellitus without complications: Secondary | ICD-10-CM | POA: Diagnosis not present

## 2021-07-25 DIAGNOSIS — M6281 Muscle weakness (generalized): Secondary | ICD-10-CM | POA: Diagnosis not present

## 2021-07-25 DIAGNOSIS — R2681 Unsteadiness on feet: Secondary | ICD-10-CM | POA: Diagnosis not present

## 2021-07-25 DIAGNOSIS — R296 Repeated falls: Secondary | ICD-10-CM | POA: Diagnosis not present

## 2021-07-29 DIAGNOSIS — R296 Repeated falls: Secondary | ICD-10-CM | POA: Diagnosis not present

## 2021-07-29 DIAGNOSIS — R2681 Unsteadiness on feet: Secondary | ICD-10-CM | POA: Diagnosis not present

## 2021-07-29 DIAGNOSIS — M6281 Muscle weakness (generalized): Secondary | ICD-10-CM | POA: Diagnosis not present

## 2021-07-31 DIAGNOSIS — R2681 Unsteadiness on feet: Secondary | ICD-10-CM | POA: Diagnosis not present

## 2021-07-31 DIAGNOSIS — M6281 Muscle weakness (generalized): Secondary | ICD-10-CM | POA: Diagnosis not present

## 2021-07-31 DIAGNOSIS — R296 Repeated falls: Secondary | ICD-10-CM | POA: Diagnosis not present

## 2021-08-05 DIAGNOSIS — R296 Repeated falls: Secondary | ICD-10-CM | POA: Diagnosis not present

## 2021-08-05 DIAGNOSIS — R2681 Unsteadiness on feet: Secondary | ICD-10-CM | POA: Diagnosis not present

## 2021-08-05 DIAGNOSIS — M6281 Muscle weakness (generalized): Secondary | ICD-10-CM | POA: Diagnosis not present

## 2021-08-07 DIAGNOSIS — R2681 Unsteadiness on feet: Secondary | ICD-10-CM | POA: Diagnosis not present

## 2021-08-07 DIAGNOSIS — R296 Repeated falls: Secondary | ICD-10-CM | POA: Diagnosis not present

## 2021-08-07 DIAGNOSIS — M6281 Muscle weakness (generalized): Secondary | ICD-10-CM | POA: Diagnosis not present

## 2021-08-12 DIAGNOSIS — R2681 Unsteadiness on feet: Secondary | ICD-10-CM | POA: Diagnosis not present

## 2021-08-12 DIAGNOSIS — Z79899 Other long term (current) drug therapy: Secondary | ICD-10-CM | POA: Diagnosis not present

## 2021-08-12 DIAGNOSIS — N3281 Overactive bladder: Secondary | ICD-10-CM | POA: Diagnosis not present

## 2021-08-12 DIAGNOSIS — I5042 Chronic combined systolic (congestive) and diastolic (congestive) heart failure: Secondary | ICD-10-CM | POA: Diagnosis not present

## 2021-08-12 DIAGNOSIS — M6281 Muscle weakness (generalized): Secondary | ICD-10-CM | POA: Diagnosis not present

## 2021-08-12 DIAGNOSIS — E119 Type 2 diabetes mellitus without complications: Secondary | ICD-10-CM | POA: Diagnosis not present

## 2021-08-12 DIAGNOSIS — I10 Essential (primary) hypertension: Secondary | ICD-10-CM | POA: Diagnosis not present

## 2021-08-12 DIAGNOSIS — R296 Repeated falls: Secondary | ICD-10-CM | POA: Diagnosis not present

## 2021-08-14 DIAGNOSIS — M6281 Muscle weakness (generalized): Secondary | ICD-10-CM | POA: Diagnosis not present

## 2021-08-14 DIAGNOSIS — R2681 Unsteadiness on feet: Secondary | ICD-10-CM | POA: Diagnosis not present

## 2021-08-14 DIAGNOSIS — R296 Repeated falls: Secondary | ICD-10-CM | POA: Diagnosis not present

## 2021-08-15 DIAGNOSIS — M6281 Muscle weakness (generalized): Secondary | ICD-10-CM | POA: Diagnosis not present

## 2021-08-15 DIAGNOSIS — R2681 Unsteadiness on feet: Secondary | ICD-10-CM | POA: Diagnosis not present

## 2021-08-15 DIAGNOSIS — R296 Repeated falls: Secondary | ICD-10-CM | POA: Diagnosis not present

## 2021-08-23 DIAGNOSIS — E559 Vitamin D deficiency, unspecified: Secondary | ICD-10-CM | POA: Diagnosis not present

## 2021-08-23 DIAGNOSIS — Z79899 Other long term (current) drug therapy: Secondary | ICD-10-CM | POA: Diagnosis not present

## 2021-08-23 DIAGNOSIS — E119 Type 2 diabetes mellitus without complications: Secondary | ICD-10-CM | POA: Diagnosis not present

## 2021-09-02 DIAGNOSIS — N3281 Overactive bladder: Secondary | ICD-10-CM | POA: Diagnosis not present

## 2021-09-02 DIAGNOSIS — I10 Essential (primary) hypertension: Secondary | ICD-10-CM | POA: Diagnosis not present

## 2021-09-02 DIAGNOSIS — M170 Bilateral primary osteoarthritis of knee: Secondary | ICD-10-CM | POA: Diagnosis not present

## 2021-09-02 DIAGNOSIS — I5042 Chronic combined systolic (congestive) and diastolic (congestive) heart failure: Secondary | ICD-10-CM | POA: Diagnosis not present

## 2021-09-18 DIAGNOSIS — M79671 Pain in right foot: Secondary | ICD-10-CM | POA: Diagnosis not present

## 2021-09-18 DIAGNOSIS — M2041 Other hammer toe(s) (acquired), right foot: Secondary | ICD-10-CM | POA: Diagnosis not present

## 2021-09-18 DIAGNOSIS — M79661 Pain in right lower leg: Secondary | ICD-10-CM | POA: Diagnosis not present

## 2021-09-18 DIAGNOSIS — M79662 Pain in left lower leg: Secondary | ICD-10-CM | POA: Diagnosis not present

## 2021-09-18 DIAGNOSIS — M79672 Pain in left foot: Secondary | ICD-10-CM | POA: Diagnosis not present

## 2021-09-23 DIAGNOSIS — Z79899 Other long term (current) drug therapy: Secondary | ICD-10-CM | POA: Diagnosis not present

## 2021-09-23 DIAGNOSIS — M170 Bilateral primary osteoarthritis of knee: Secondary | ICD-10-CM | POA: Diagnosis not present

## 2021-09-23 DIAGNOSIS — E119 Type 2 diabetes mellitus without complications: Secondary | ICD-10-CM | POA: Diagnosis not present

## 2021-09-29 DIAGNOSIS — N3001 Acute cystitis with hematuria: Secondary | ICD-10-CM | POA: Diagnosis not present

## 2021-09-29 DIAGNOSIS — E119 Type 2 diabetes mellitus without complications: Secondary | ICD-10-CM | POA: Diagnosis not present

## 2021-09-29 DIAGNOSIS — I482 Chronic atrial fibrillation, unspecified: Secondary | ICD-10-CM | POA: Diagnosis not present

## 2021-09-29 DIAGNOSIS — Z883 Allergy status to other anti-infective agents status: Secondary | ICD-10-CM | POA: Diagnosis not present

## 2021-09-29 DIAGNOSIS — R5381 Other malaise: Secondary | ICD-10-CM | POA: Diagnosis not present

## 2021-09-29 DIAGNOSIS — Z20822 Contact with and (suspected) exposure to covid-19: Secondary | ICD-10-CM | POA: Diagnosis not present

## 2021-09-29 DIAGNOSIS — K219 Gastro-esophageal reflux disease without esophagitis: Secondary | ICD-10-CM | POA: Diagnosis not present

## 2021-09-29 DIAGNOSIS — E785 Hyperlipidemia, unspecified: Secondary | ICD-10-CM | POA: Diagnosis not present

## 2021-09-29 DIAGNOSIS — Z888 Allergy status to other drugs, medicaments and biological substances status: Secondary | ICD-10-CM | POA: Diagnosis not present

## 2021-09-29 DIAGNOSIS — Z886 Allergy status to analgesic agent status: Secondary | ICD-10-CM | POA: Diagnosis not present

## 2021-09-29 DIAGNOSIS — E079 Disorder of thyroid, unspecified: Secondary | ICD-10-CM | POA: Diagnosis not present

## 2021-09-29 DIAGNOSIS — R42 Dizziness and giddiness: Secondary | ICD-10-CM | POA: Diagnosis not present

## 2021-09-29 DIAGNOSIS — R5383 Other fatigue: Secondary | ICD-10-CM | POA: Diagnosis not present

## 2021-09-29 DIAGNOSIS — R270 Ataxia, unspecified: Secondary | ICD-10-CM | POA: Diagnosis not present

## 2021-09-29 DIAGNOSIS — R531 Weakness: Secondary | ICD-10-CM | POA: Diagnosis not present

## 2021-09-29 DIAGNOSIS — Z7901 Long term (current) use of anticoagulants: Secondary | ICD-10-CM | POA: Diagnosis not present

## 2021-09-29 DIAGNOSIS — I10 Essential (primary) hypertension: Secondary | ICD-10-CM | POA: Diagnosis not present

## 2021-09-29 DIAGNOSIS — Z882 Allergy status to sulfonamides status: Secondary | ICD-10-CM | POA: Diagnosis not present

## 2021-09-29 DIAGNOSIS — G459 Transient cerebral ischemic attack, unspecified: Secondary | ICD-10-CM | POA: Diagnosis not present

## 2021-09-29 DIAGNOSIS — Z79899 Other long term (current) drug therapy: Secondary | ICD-10-CM | POA: Diagnosis not present

## 2021-10-05 DIAGNOSIS — I110 Hypertensive heart disease with heart failure: Secondary | ICD-10-CM | POA: Diagnosis not present

## 2021-10-05 DIAGNOSIS — R269 Unspecified abnormalities of gait and mobility: Secondary | ICD-10-CM | POA: Diagnosis not present

## 2021-10-05 DIAGNOSIS — M170 Bilateral primary osteoarthritis of knee: Secondary | ICD-10-CM | POA: Diagnosis not present

## 2021-10-05 DIAGNOSIS — E119 Type 2 diabetes mellitus without complications: Secondary | ICD-10-CM | POA: Diagnosis not present

## 2021-10-14 DIAGNOSIS — Z9181 History of falling: Secondary | ICD-10-CM | POA: Diagnosis not present

## 2021-10-14 DIAGNOSIS — E119 Type 2 diabetes mellitus without complications: Secondary | ICD-10-CM | POA: Diagnosis not present

## 2021-10-14 DIAGNOSIS — R296 Repeated falls: Secondary | ICD-10-CM | POA: Diagnosis not present

## 2021-10-14 DIAGNOSIS — R2689 Other abnormalities of gait and mobility: Secondary | ICD-10-CM | POA: Diagnosis not present

## 2021-10-17 DIAGNOSIS — R269 Unspecified abnormalities of gait and mobility: Secondary | ICD-10-CM | POA: Diagnosis not present

## 2021-10-17 DIAGNOSIS — M170 Bilateral primary osteoarthritis of knee: Secondary | ICD-10-CM | POA: Diagnosis not present

## 2021-10-17 DIAGNOSIS — E119 Type 2 diabetes mellitus without complications: Secondary | ICD-10-CM | POA: Diagnosis not present

## 2021-10-17 DIAGNOSIS — I110 Hypertensive heart disease with heart failure: Secondary | ICD-10-CM | POA: Diagnosis not present

## 2021-10-23 DIAGNOSIS — I509 Heart failure, unspecified: Secondary | ICD-10-CM | POA: Diagnosis not present

## 2021-10-23 DIAGNOSIS — Z7984 Long term (current) use of oral hypoglycemic drugs: Secondary | ICD-10-CM | POA: Diagnosis not present

## 2021-10-23 DIAGNOSIS — Z9181 History of falling: Secondary | ICD-10-CM | POA: Diagnosis not present

## 2021-10-23 DIAGNOSIS — I110 Hypertensive heart disease with heart failure: Secondary | ICD-10-CM | POA: Diagnosis not present

## 2021-10-23 DIAGNOSIS — Z7901 Long term (current) use of anticoagulants: Secondary | ICD-10-CM | POA: Diagnosis not present

## 2021-10-23 DIAGNOSIS — Z96653 Presence of artificial knee joint, bilateral: Secondary | ICD-10-CM | POA: Diagnosis not present

## 2021-10-23 DIAGNOSIS — I482 Chronic atrial fibrillation, unspecified: Secondary | ICD-10-CM | POA: Diagnosis not present

## 2021-10-23 DIAGNOSIS — N3281 Overactive bladder: Secondary | ICD-10-CM | POA: Diagnosis not present

## 2021-10-23 DIAGNOSIS — K219 Gastro-esophageal reflux disease without esophagitis: Secondary | ICD-10-CM | POA: Diagnosis not present

## 2021-10-23 DIAGNOSIS — E039 Hypothyroidism, unspecified: Secondary | ICD-10-CM | POA: Diagnosis not present

## 2021-10-23 DIAGNOSIS — E119 Type 2 diabetes mellitus without complications: Secondary | ICD-10-CM | POA: Diagnosis not present

## 2021-10-23 DIAGNOSIS — Z792 Long term (current) use of antibiotics: Secondary | ICD-10-CM | POA: Diagnosis not present

## 2021-10-24 DIAGNOSIS — Z7901 Long term (current) use of anticoagulants: Secondary | ICD-10-CM | POA: Diagnosis not present

## 2021-10-24 DIAGNOSIS — I110 Hypertensive heart disease with heart failure: Secondary | ICD-10-CM | POA: Diagnosis not present

## 2021-10-24 DIAGNOSIS — I509 Heart failure, unspecified: Secondary | ICD-10-CM | POA: Diagnosis not present

## 2021-10-24 DIAGNOSIS — E119 Type 2 diabetes mellitus without complications: Secondary | ICD-10-CM | POA: Diagnosis not present

## 2021-10-24 DIAGNOSIS — N3281 Overactive bladder: Secondary | ICD-10-CM | POA: Diagnosis not present

## 2021-10-24 DIAGNOSIS — K219 Gastro-esophageal reflux disease without esophagitis: Secondary | ICD-10-CM | POA: Diagnosis not present

## 2021-10-24 DIAGNOSIS — Z792 Long term (current) use of antibiotics: Secondary | ICD-10-CM | POA: Diagnosis not present

## 2021-10-24 DIAGNOSIS — Z9181 History of falling: Secondary | ICD-10-CM | POA: Diagnosis not present

## 2021-10-24 DIAGNOSIS — Z7984 Long term (current) use of oral hypoglycemic drugs: Secondary | ICD-10-CM | POA: Diagnosis not present

## 2021-10-24 DIAGNOSIS — Z96653 Presence of artificial knee joint, bilateral: Secondary | ICD-10-CM | POA: Diagnosis not present

## 2021-10-24 DIAGNOSIS — I482 Chronic atrial fibrillation, unspecified: Secondary | ICD-10-CM | POA: Diagnosis not present

## 2021-10-24 DIAGNOSIS — E039 Hypothyroidism, unspecified: Secondary | ICD-10-CM | POA: Diagnosis not present

## 2021-10-29 DIAGNOSIS — M170 Bilateral primary osteoarthritis of knee: Secondary | ICD-10-CM | POA: Diagnosis not present

## 2021-10-29 DIAGNOSIS — Z792 Long term (current) use of antibiotics: Secondary | ICD-10-CM | POA: Diagnosis not present

## 2021-10-29 DIAGNOSIS — Z9181 History of falling: Secondary | ICD-10-CM | POA: Diagnosis not present

## 2021-10-29 DIAGNOSIS — I482 Chronic atrial fibrillation, unspecified: Secondary | ICD-10-CM | POA: Diagnosis not present

## 2021-10-29 DIAGNOSIS — E039 Hypothyroidism, unspecified: Secondary | ICD-10-CM | POA: Diagnosis not present

## 2021-10-29 DIAGNOSIS — E119 Type 2 diabetes mellitus without complications: Secondary | ICD-10-CM | POA: Diagnosis not present

## 2021-10-29 DIAGNOSIS — Z7984 Long term (current) use of oral hypoglycemic drugs: Secondary | ICD-10-CM | POA: Diagnosis not present

## 2021-10-29 DIAGNOSIS — I110 Hypertensive heart disease with heart failure: Secondary | ICD-10-CM | POA: Diagnosis not present

## 2021-10-29 DIAGNOSIS — I509 Heart failure, unspecified: Secondary | ICD-10-CM | POA: Diagnosis not present

## 2021-10-29 DIAGNOSIS — N3281 Overactive bladder: Secondary | ICD-10-CM | POA: Diagnosis not present

## 2021-10-29 DIAGNOSIS — K219 Gastro-esophageal reflux disease without esophagitis: Secondary | ICD-10-CM | POA: Diagnosis not present

## 2021-10-29 DIAGNOSIS — Z7901 Long term (current) use of anticoagulants: Secondary | ICD-10-CM | POA: Diagnosis not present

## 2021-11-01 DIAGNOSIS — Z9181 History of falling: Secondary | ICD-10-CM | POA: Diagnosis not present

## 2021-11-01 DIAGNOSIS — I110 Hypertensive heart disease with heart failure: Secondary | ICD-10-CM | POA: Diagnosis not present

## 2021-11-01 DIAGNOSIS — M170 Bilateral primary osteoarthritis of knee: Secondary | ICD-10-CM | POA: Diagnosis not present

## 2021-11-01 DIAGNOSIS — I482 Chronic atrial fibrillation, unspecified: Secondary | ICD-10-CM | POA: Diagnosis not present

## 2021-11-01 DIAGNOSIS — E119 Type 2 diabetes mellitus without complications: Secondary | ICD-10-CM | POA: Diagnosis not present

## 2021-11-01 DIAGNOSIS — N3281 Overactive bladder: Secondary | ICD-10-CM | POA: Diagnosis not present

## 2021-11-01 DIAGNOSIS — Z792 Long term (current) use of antibiotics: Secondary | ICD-10-CM | POA: Diagnosis not present

## 2021-11-01 DIAGNOSIS — Z7901 Long term (current) use of anticoagulants: Secondary | ICD-10-CM | POA: Diagnosis not present

## 2021-11-01 DIAGNOSIS — I509 Heart failure, unspecified: Secondary | ICD-10-CM | POA: Diagnosis not present

## 2021-11-01 DIAGNOSIS — E039 Hypothyroidism, unspecified: Secondary | ICD-10-CM | POA: Diagnosis not present

## 2021-11-01 DIAGNOSIS — K219 Gastro-esophageal reflux disease without esophagitis: Secondary | ICD-10-CM | POA: Diagnosis not present

## 2021-11-01 DIAGNOSIS — Z7984 Long term (current) use of oral hypoglycemic drugs: Secondary | ICD-10-CM | POA: Diagnosis not present

## 2021-11-12 DIAGNOSIS — I5042 Chronic combined systolic (congestive) and diastolic (congestive) heart failure: Secondary | ICD-10-CM | POA: Diagnosis not present

## 2021-11-12 DIAGNOSIS — I10 Essential (primary) hypertension: Secondary | ICD-10-CM | POA: Diagnosis not present

## 2021-11-12 DIAGNOSIS — M170 Bilateral primary osteoarthritis of knee: Secondary | ICD-10-CM | POA: Diagnosis not present

## 2021-11-12 DIAGNOSIS — B372 Candidiasis of skin and nail: Secondary | ICD-10-CM | POA: Diagnosis not present

## 2021-11-12 DIAGNOSIS — E119 Type 2 diabetes mellitus without complications: Secondary | ICD-10-CM | POA: Diagnosis not present

## 2021-11-12 DIAGNOSIS — Z79899 Other long term (current) drug therapy: Secondary | ICD-10-CM | POA: Diagnosis not present

## 2021-11-14 DIAGNOSIS — Z7901 Long term (current) use of anticoagulants: Secondary | ICD-10-CM | POA: Diagnosis not present

## 2021-11-14 DIAGNOSIS — I110 Hypertensive heart disease with heart failure: Secondary | ICD-10-CM | POA: Diagnosis not present

## 2021-11-14 DIAGNOSIS — K219 Gastro-esophageal reflux disease without esophagitis: Secondary | ICD-10-CM | POA: Diagnosis not present

## 2021-11-14 DIAGNOSIS — N3281 Overactive bladder: Secondary | ICD-10-CM | POA: Diagnosis not present

## 2021-11-14 DIAGNOSIS — M170 Bilateral primary osteoarthritis of knee: Secondary | ICD-10-CM | POA: Diagnosis not present

## 2021-11-14 DIAGNOSIS — Z9181 History of falling: Secondary | ICD-10-CM | POA: Diagnosis not present

## 2021-11-14 DIAGNOSIS — Z7984 Long term (current) use of oral hypoglycemic drugs: Secondary | ICD-10-CM | POA: Diagnosis not present

## 2021-11-14 DIAGNOSIS — I482 Chronic atrial fibrillation, unspecified: Secondary | ICD-10-CM | POA: Diagnosis not present

## 2021-11-14 DIAGNOSIS — I509 Heart failure, unspecified: Secondary | ICD-10-CM | POA: Diagnosis not present

## 2021-11-14 DIAGNOSIS — E039 Hypothyroidism, unspecified: Secondary | ICD-10-CM | POA: Diagnosis not present

## 2021-11-14 DIAGNOSIS — Z792 Long term (current) use of antibiotics: Secondary | ICD-10-CM | POA: Diagnosis not present

## 2021-11-14 DIAGNOSIS — E119 Type 2 diabetes mellitus without complications: Secondary | ICD-10-CM | POA: Diagnosis not present

## 2021-11-15 DIAGNOSIS — Z792 Long term (current) use of antibiotics: Secondary | ICD-10-CM | POA: Diagnosis not present

## 2021-11-15 DIAGNOSIS — M170 Bilateral primary osteoarthritis of knee: Secondary | ICD-10-CM | POA: Diagnosis not present

## 2021-11-15 DIAGNOSIS — E039 Hypothyroidism, unspecified: Secondary | ICD-10-CM | POA: Diagnosis not present

## 2021-11-15 DIAGNOSIS — K219 Gastro-esophageal reflux disease without esophagitis: Secondary | ICD-10-CM | POA: Diagnosis not present

## 2021-11-15 DIAGNOSIS — Z7984 Long term (current) use of oral hypoglycemic drugs: Secondary | ICD-10-CM | POA: Diagnosis not present

## 2021-11-15 DIAGNOSIS — I509 Heart failure, unspecified: Secondary | ICD-10-CM | POA: Diagnosis not present

## 2021-11-15 DIAGNOSIS — I110 Hypertensive heart disease with heart failure: Secondary | ICD-10-CM | POA: Diagnosis not present

## 2021-11-15 DIAGNOSIS — I482 Chronic atrial fibrillation, unspecified: Secondary | ICD-10-CM | POA: Diagnosis not present

## 2021-11-15 DIAGNOSIS — N3281 Overactive bladder: Secondary | ICD-10-CM | POA: Diagnosis not present

## 2021-11-15 DIAGNOSIS — Z9181 History of falling: Secondary | ICD-10-CM | POA: Diagnosis not present

## 2021-11-15 DIAGNOSIS — Z7901 Long term (current) use of anticoagulants: Secondary | ICD-10-CM | POA: Diagnosis not present

## 2021-11-15 DIAGNOSIS — E119 Type 2 diabetes mellitus without complications: Secondary | ICD-10-CM | POA: Diagnosis not present

## 2021-11-19 DIAGNOSIS — I509 Heart failure, unspecified: Secondary | ICD-10-CM | POA: Diagnosis not present

## 2021-11-19 DIAGNOSIS — K219 Gastro-esophageal reflux disease without esophagitis: Secondary | ICD-10-CM | POA: Diagnosis not present

## 2021-11-19 DIAGNOSIS — E039 Hypothyroidism, unspecified: Secondary | ICD-10-CM | POA: Diagnosis not present

## 2021-11-19 DIAGNOSIS — I110 Hypertensive heart disease with heart failure: Secondary | ICD-10-CM | POA: Diagnosis not present

## 2021-11-19 DIAGNOSIS — Z9181 History of falling: Secondary | ICD-10-CM | POA: Diagnosis not present

## 2021-11-19 DIAGNOSIS — B372 Candidiasis of skin and nail: Secondary | ICD-10-CM | POA: Diagnosis not present

## 2021-11-19 DIAGNOSIS — M170 Bilateral primary osteoarthritis of knee: Secondary | ICD-10-CM | POA: Diagnosis not present

## 2021-11-19 DIAGNOSIS — I10 Essential (primary) hypertension: Secondary | ICD-10-CM | POA: Diagnosis not present

## 2021-11-19 DIAGNOSIS — Z7984 Long term (current) use of oral hypoglycemic drugs: Secondary | ICD-10-CM | POA: Diagnosis not present

## 2021-11-19 DIAGNOSIS — Z7901 Long term (current) use of anticoagulants: Secondary | ICD-10-CM | POA: Diagnosis not present

## 2021-11-19 DIAGNOSIS — Z792 Long term (current) use of antibiotics: Secondary | ICD-10-CM | POA: Diagnosis not present

## 2021-11-19 DIAGNOSIS — E119 Type 2 diabetes mellitus without complications: Secondary | ICD-10-CM | POA: Diagnosis not present

## 2021-11-19 DIAGNOSIS — R2689 Other abnormalities of gait and mobility: Secondary | ICD-10-CM | POA: Diagnosis not present

## 2021-11-19 DIAGNOSIS — N3281 Overactive bladder: Secondary | ICD-10-CM | POA: Diagnosis not present

## 2021-11-19 DIAGNOSIS — I482 Chronic atrial fibrillation, unspecified: Secondary | ICD-10-CM | POA: Diagnosis not present

## 2021-11-19 DIAGNOSIS — Z79899 Other long term (current) drug therapy: Secondary | ICD-10-CM | POA: Diagnosis not present

## 2021-11-26 DIAGNOSIS — I509 Heart failure, unspecified: Secondary | ICD-10-CM | POA: Diagnosis not present

## 2021-11-26 DIAGNOSIS — I110 Hypertensive heart disease with heart failure: Secondary | ICD-10-CM | POA: Diagnosis not present

## 2021-11-26 DIAGNOSIS — E119 Type 2 diabetes mellitus without complications: Secondary | ICD-10-CM | POA: Diagnosis not present

## 2021-11-26 DIAGNOSIS — N3281 Overactive bladder: Secondary | ICD-10-CM | POA: Diagnosis not present

## 2021-11-26 DIAGNOSIS — Z7901 Long term (current) use of anticoagulants: Secondary | ICD-10-CM | POA: Diagnosis not present

## 2021-11-26 DIAGNOSIS — E039 Hypothyroidism, unspecified: Secondary | ICD-10-CM | POA: Diagnosis not present

## 2021-11-26 DIAGNOSIS — Z9181 History of falling: Secondary | ICD-10-CM | POA: Diagnosis not present

## 2021-11-26 DIAGNOSIS — Z792 Long term (current) use of antibiotics: Secondary | ICD-10-CM | POA: Diagnosis not present

## 2021-11-26 DIAGNOSIS — I482 Chronic atrial fibrillation, unspecified: Secondary | ICD-10-CM | POA: Diagnosis not present

## 2021-11-26 DIAGNOSIS — K219 Gastro-esophageal reflux disease without esophagitis: Secondary | ICD-10-CM | POA: Diagnosis not present

## 2021-11-26 DIAGNOSIS — M170 Bilateral primary osteoarthritis of knee: Secondary | ICD-10-CM | POA: Diagnosis not present

## 2021-11-26 DIAGNOSIS — Z7984 Long term (current) use of oral hypoglycemic drugs: Secondary | ICD-10-CM | POA: Diagnosis not present

## 2021-12-03 DIAGNOSIS — R2689 Other abnormalities of gait and mobility: Secondary | ICD-10-CM | POA: Diagnosis not present

## 2021-12-03 DIAGNOSIS — E119 Type 2 diabetes mellitus without complications: Secondary | ICD-10-CM | POA: Diagnosis not present

## 2021-12-03 DIAGNOSIS — I509 Heart failure, unspecified: Secondary | ICD-10-CM | POA: Diagnosis not present

## 2021-12-03 DIAGNOSIS — Z9181 History of falling: Secondary | ICD-10-CM | POA: Diagnosis not present

## 2021-12-03 DIAGNOSIS — N3281 Overactive bladder: Secondary | ICD-10-CM | POA: Diagnosis not present

## 2021-12-03 DIAGNOSIS — M170 Bilateral primary osteoarthritis of knee: Secondary | ICD-10-CM | POA: Diagnosis not present

## 2021-12-03 DIAGNOSIS — I110 Hypertensive heart disease with heart failure: Secondary | ICD-10-CM | POA: Diagnosis not present

## 2021-12-03 DIAGNOSIS — K219 Gastro-esophageal reflux disease without esophagitis: Secondary | ICD-10-CM | POA: Diagnosis not present

## 2021-12-03 DIAGNOSIS — I482 Chronic atrial fibrillation, unspecified: Secondary | ICD-10-CM | POA: Diagnosis not present

## 2021-12-03 DIAGNOSIS — Z7901 Long term (current) use of anticoagulants: Secondary | ICD-10-CM | POA: Diagnosis not present

## 2021-12-03 DIAGNOSIS — Z792 Long term (current) use of antibiotics: Secondary | ICD-10-CM | POA: Diagnosis not present

## 2021-12-03 DIAGNOSIS — E039 Hypothyroidism, unspecified: Secondary | ICD-10-CM | POA: Diagnosis not present

## 2021-12-03 DIAGNOSIS — R296 Repeated falls: Secondary | ICD-10-CM | POA: Diagnosis not present

## 2021-12-03 DIAGNOSIS — B372 Candidiasis of skin and nail: Secondary | ICD-10-CM | POA: Diagnosis not present

## 2021-12-03 DIAGNOSIS — Z7984 Long term (current) use of oral hypoglycemic drugs: Secondary | ICD-10-CM | POA: Diagnosis not present

## 2021-12-13 DIAGNOSIS — M2031 Hallux varus (acquired), right foot: Secondary | ICD-10-CM | POA: Diagnosis not present

## 2021-12-13 DIAGNOSIS — M79661 Pain in right lower leg: Secondary | ICD-10-CM | POA: Diagnosis not present

## 2021-12-13 DIAGNOSIS — M21371 Foot drop, right foot: Secondary | ICD-10-CM | POA: Diagnosis not present

## 2021-12-13 DIAGNOSIS — M79662 Pain in left lower leg: Secondary | ICD-10-CM | POA: Diagnosis not present

## 2021-12-13 DIAGNOSIS — M21372 Foot drop, left foot: Secondary | ICD-10-CM | POA: Diagnosis not present

## 2021-12-13 DIAGNOSIS — R262 Difficulty in walking, not elsewhere classified: Secondary | ICD-10-CM | POA: Diagnosis not present

## 2021-12-13 DIAGNOSIS — M2040 Other hammer toe(s) (acquired), unspecified foot: Secondary | ICD-10-CM | POA: Diagnosis not present

## 2021-12-13 DIAGNOSIS — M2032 Hallux varus (acquired), left foot: Secondary | ICD-10-CM | POA: Diagnosis not present

## 2021-12-23 DIAGNOSIS — I5042 Chronic combined systolic (congestive) and diastolic (congestive) heart failure: Secondary | ICD-10-CM | POA: Diagnosis not present

## 2021-12-23 DIAGNOSIS — Z9181 History of falling: Secondary | ICD-10-CM | POA: Diagnosis not present

## 2021-12-23 DIAGNOSIS — M170 Bilateral primary osteoarthritis of knee: Secondary | ICD-10-CM | POA: Diagnosis not present

## 2021-12-23 DIAGNOSIS — E119 Type 2 diabetes mellitus without complications: Secondary | ICD-10-CM | POA: Diagnosis not present

## 2021-12-23 DIAGNOSIS — M5431 Sciatica, right side: Secondary | ICD-10-CM | POA: Diagnosis not present

## 2021-12-23 DIAGNOSIS — B372 Candidiasis of skin and nail: Secondary | ICD-10-CM | POA: Diagnosis not present

## 2021-12-23 DIAGNOSIS — I10 Essential (primary) hypertension: Secondary | ICD-10-CM | POA: Diagnosis not present

## 2022-01-03 DIAGNOSIS — R531 Weakness: Secondary | ICD-10-CM | POA: Diagnosis not present

## 2022-01-03 DIAGNOSIS — Z881 Allergy status to other antibiotic agents status: Secondary | ICD-10-CM | POA: Diagnosis not present

## 2022-01-03 DIAGNOSIS — M5136 Other intervertebral disc degeneration, lumbar region: Secondary | ICD-10-CM | POA: Diagnosis not present

## 2022-01-03 DIAGNOSIS — I10 Essential (primary) hypertension: Secondary | ICD-10-CM | POA: Diagnosis not present

## 2022-01-03 DIAGNOSIS — M48061 Spinal stenosis, lumbar region without neurogenic claudication: Secondary | ICD-10-CM | POA: Diagnosis not present

## 2022-01-03 DIAGNOSIS — I482 Chronic atrial fibrillation, unspecified: Secondary | ICD-10-CM | POA: Diagnosis not present

## 2022-01-03 DIAGNOSIS — Z886 Allergy status to analgesic agent status: Secondary | ICD-10-CM | POA: Diagnosis not present

## 2022-01-03 DIAGNOSIS — E785 Hyperlipidemia, unspecified: Secondary | ICD-10-CM | POA: Diagnosis not present

## 2022-01-03 DIAGNOSIS — Z79899 Other long term (current) drug therapy: Secondary | ICD-10-CM | POA: Diagnosis not present

## 2022-01-03 DIAGNOSIS — Z96651 Presence of right artificial knee joint: Secondary | ICD-10-CM | POA: Diagnosis not present

## 2022-01-03 DIAGNOSIS — K219 Gastro-esophageal reflux disease without esophagitis: Secondary | ICD-10-CM | POA: Diagnosis not present

## 2022-01-03 DIAGNOSIS — R937 Abnormal findings on diagnostic imaging of other parts of musculoskeletal system: Secondary | ICD-10-CM | POA: Diagnosis not present

## 2022-01-03 DIAGNOSIS — Z888 Allergy status to other drugs, medicaments and biological substances status: Secondary | ICD-10-CM | POA: Diagnosis not present

## 2022-01-03 DIAGNOSIS — Z7901 Long term (current) use of anticoagulants: Secondary | ICD-10-CM | POA: Diagnosis not present

## 2022-01-03 DIAGNOSIS — N3001 Acute cystitis with hematuria: Secondary | ICD-10-CM | POA: Diagnosis not present

## 2022-01-03 DIAGNOSIS — D320 Benign neoplasm of cerebral meninges: Secondary | ICD-10-CM | POA: Diagnosis not present

## 2022-01-03 DIAGNOSIS — R9389 Abnormal findings on diagnostic imaging of other specified body structures: Secondary | ICD-10-CM | POA: Diagnosis not present

## 2022-01-03 DIAGNOSIS — E079 Disorder of thyroid, unspecified: Secondary | ICD-10-CM | POA: Diagnosis not present

## 2022-01-03 DIAGNOSIS — Z882 Allergy status to sulfonamides status: Secondary | ICD-10-CM | POA: Diagnosis not present

## 2022-01-03 DIAGNOSIS — R29818 Other symptoms and signs involving the nervous system: Secondary | ICD-10-CM | POA: Diagnosis not present

## 2022-01-03 DIAGNOSIS — F32A Depression, unspecified: Secondary | ICD-10-CM | POA: Diagnosis not present

## 2022-01-07 DIAGNOSIS — I088 Other rheumatic multiple valve diseases: Secondary | ICD-10-CM | POA: Diagnosis not present

## 2022-01-07 DIAGNOSIS — I4891 Unspecified atrial fibrillation: Secondary | ICD-10-CM | POA: Diagnosis not present

## 2022-01-13 DIAGNOSIS — E1122 Type 2 diabetes mellitus with diabetic chronic kidney disease: Secondary | ICD-10-CM | POA: Diagnosis not present

## 2022-01-13 DIAGNOSIS — R2689 Other abnormalities of gait and mobility: Secondary | ICD-10-CM | POA: Diagnosis not present

## 2022-01-13 DIAGNOSIS — I5031 Acute diastolic (congestive) heart failure: Secondary | ICD-10-CM | POA: Diagnosis not present

## 2022-01-13 DIAGNOSIS — R278 Other lack of coordination: Secondary | ICD-10-CM | POA: Diagnosis not present

## 2022-01-13 DIAGNOSIS — I10 Essential (primary) hypertension: Secondary | ICD-10-CM | POA: Diagnosis not present

## 2022-01-13 DIAGNOSIS — M1990 Unspecified osteoarthritis, unspecified site: Secondary | ICD-10-CM | POA: Diagnosis not present

## 2022-01-13 DIAGNOSIS — M170 Bilateral primary osteoarthritis of knee: Secondary | ICD-10-CM | POA: Diagnosis not present

## 2022-01-13 DIAGNOSIS — I5042 Chronic combined systolic (congestive) and diastolic (congestive) heart failure: Secondary | ICD-10-CM | POA: Diagnosis not present

## 2022-01-13 DIAGNOSIS — I482 Chronic atrial fibrillation, unspecified: Secondary | ICD-10-CM | POA: Diagnosis not present

## 2022-01-13 DIAGNOSIS — M6281 Muscle weakness (generalized): Secondary | ICD-10-CM | POA: Diagnosis not present

## 2022-01-13 DIAGNOSIS — I130 Hypertensive heart and chronic kidney disease with heart failure and stage 1 through stage 4 chronic kidney disease, or unspecified chronic kidney disease: Secondary | ICD-10-CM | POA: Diagnosis not present

## 2022-01-13 DIAGNOSIS — R296 Repeated falls: Secondary | ICD-10-CM | POA: Diagnosis not present

## 2022-01-13 DIAGNOSIS — E119 Type 2 diabetes mellitus without complications: Secondary | ICD-10-CM | POA: Diagnosis not present

## 2022-01-13 DIAGNOSIS — N3281 Overactive bladder: Secondary | ICD-10-CM | POA: Diagnosis not present

## 2022-01-17 DIAGNOSIS — I4821 Permanent atrial fibrillation: Secondary | ICD-10-CM | POA: Diagnosis not present

## 2022-01-17 DIAGNOSIS — R296 Repeated falls: Secondary | ICD-10-CM | POA: Diagnosis not present

## 2022-01-17 DIAGNOSIS — Z8673 Personal history of transient ischemic attack (TIA), and cerebral infarction without residual deficits: Secondary | ICD-10-CM | POA: Diagnosis not present

## 2022-01-17 DIAGNOSIS — I428 Other cardiomyopathies: Secondary | ICD-10-CM | POA: Diagnosis not present

## 2022-01-17 DIAGNOSIS — I10 Essential (primary) hypertension: Secondary | ICD-10-CM | POA: Diagnosis not present

## 2022-01-17 DIAGNOSIS — I2510 Atherosclerotic heart disease of native coronary artery without angina pectoris: Secondary | ICD-10-CM | POA: Diagnosis not present

## 2022-01-17 DIAGNOSIS — I5032 Chronic diastolic (congestive) heart failure: Secondary | ICD-10-CM | POA: Diagnosis not present

## 2022-01-20 DIAGNOSIS — I482 Chronic atrial fibrillation, unspecified: Secondary | ICD-10-CM | POA: Diagnosis not present

## 2022-01-20 DIAGNOSIS — I5031 Acute diastolic (congestive) heart failure: Secondary | ICD-10-CM | POA: Diagnosis not present

## 2022-01-20 DIAGNOSIS — R296 Repeated falls: Secondary | ICD-10-CM | POA: Diagnosis not present

## 2022-01-20 DIAGNOSIS — I130 Hypertensive heart and chronic kidney disease with heart failure and stage 1 through stage 4 chronic kidney disease, or unspecified chronic kidney disease: Secondary | ICD-10-CM | POA: Diagnosis not present

## 2022-01-20 DIAGNOSIS — E1122 Type 2 diabetes mellitus with diabetic chronic kidney disease: Secondary | ICD-10-CM | POA: Diagnosis not present

## 2022-01-20 DIAGNOSIS — M6281 Muscle weakness (generalized): Secondary | ICD-10-CM | POA: Diagnosis not present

## 2022-01-20 DIAGNOSIS — M1990 Unspecified osteoarthritis, unspecified site: Secondary | ICD-10-CM | POA: Diagnosis not present

## 2022-01-20 DIAGNOSIS — R278 Other lack of coordination: Secondary | ICD-10-CM | POA: Diagnosis not present

## 2022-01-24 DIAGNOSIS — I5031 Acute diastolic (congestive) heart failure: Secondary | ICD-10-CM | POA: Diagnosis not present

## 2022-01-24 DIAGNOSIS — I482 Chronic atrial fibrillation, unspecified: Secondary | ICD-10-CM | POA: Diagnosis not present

## 2022-01-24 DIAGNOSIS — I130 Hypertensive heart and chronic kidney disease with heart failure and stage 1 through stage 4 chronic kidney disease, or unspecified chronic kidney disease: Secondary | ICD-10-CM | POA: Diagnosis not present

## 2022-01-24 DIAGNOSIS — E1122 Type 2 diabetes mellitus with diabetic chronic kidney disease: Secondary | ICD-10-CM | POA: Diagnosis not present

## 2022-01-24 DIAGNOSIS — M1990 Unspecified osteoarthritis, unspecified site: Secondary | ICD-10-CM | POA: Diagnosis not present

## 2022-01-27 DIAGNOSIS — E1122 Type 2 diabetes mellitus with diabetic chronic kidney disease: Secondary | ICD-10-CM | POA: Diagnosis not present

## 2022-01-27 DIAGNOSIS — I482 Chronic atrial fibrillation, unspecified: Secondary | ICD-10-CM | POA: Diagnosis not present

## 2022-01-27 DIAGNOSIS — I130 Hypertensive heart and chronic kidney disease with heart failure and stage 1 through stage 4 chronic kidney disease, or unspecified chronic kidney disease: Secondary | ICD-10-CM | POA: Diagnosis not present

## 2022-01-27 DIAGNOSIS — I5031 Acute diastolic (congestive) heart failure: Secondary | ICD-10-CM | POA: Diagnosis not present

## 2022-01-27 DIAGNOSIS — M1990 Unspecified osteoarthritis, unspecified site: Secondary | ICD-10-CM | POA: Diagnosis not present

## 2022-02-03 DIAGNOSIS — N390 Urinary tract infection, site not specified: Secondary | ICD-10-CM | POA: Diagnosis not present

## 2022-02-05 DIAGNOSIS — M1990 Unspecified osteoarthritis, unspecified site: Secondary | ICD-10-CM | POA: Diagnosis not present

## 2022-02-05 DIAGNOSIS — I482 Chronic atrial fibrillation, unspecified: Secondary | ICD-10-CM | POA: Diagnosis not present

## 2022-02-05 DIAGNOSIS — E1122 Type 2 diabetes mellitus with diabetic chronic kidney disease: Secondary | ICD-10-CM | POA: Diagnosis not present

## 2022-02-05 DIAGNOSIS — I130 Hypertensive heart and chronic kidney disease with heart failure and stage 1 through stage 4 chronic kidney disease, or unspecified chronic kidney disease: Secondary | ICD-10-CM | POA: Diagnosis not present

## 2022-02-05 DIAGNOSIS — I5031 Acute diastolic (congestive) heart failure: Secondary | ICD-10-CM | POA: Diagnosis not present

## 2022-02-07 DIAGNOSIS — I5031 Acute diastolic (congestive) heart failure: Secondary | ICD-10-CM | POA: Diagnosis not present

## 2022-02-07 DIAGNOSIS — I130 Hypertensive heart and chronic kidney disease with heart failure and stage 1 through stage 4 chronic kidney disease, or unspecified chronic kidney disease: Secondary | ICD-10-CM | POA: Diagnosis not present

## 2022-02-07 DIAGNOSIS — E1122 Type 2 diabetes mellitus with diabetic chronic kidney disease: Secondary | ICD-10-CM | POA: Diagnosis not present

## 2022-02-07 DIAGNOSIS — M1990 Unspecified osteoarthritis, unspecified site: Secondary | ICD-10-CM | POA: Diagnosis not present

## 2022-02-07 DIAGNOSIS — I482 Chronic atrial fibrillation, unspecified: Secondary | ICD-10-CM | POA: Diagnosis not present

## 2022-02-10 DIAGNOSIS — R296 Repeated falls: Secondary | ICD-10-CM | POA: Diagnosis not present

## 2022-02-10 DIAGNOSIS — M6281 Muscle weakness (generalized): Secondary | ICD-10-CM | POA: Diagnosis not present

## 2022-02-10 DIAGNOSIS — R278 Other lack of coordination: Secondary | ICD-10-CM | POA: Diagnosis not present

## 2022-02-10 DIAGNOSIS — I5042 Chronic combined systolic (congestive) and diastolic (congestive) heart failure: Secondary | ICD-10-CM | POA: Diagnosis not present

## 2022-02-10 DIAGNOSIS — M5431 Sciatica, right side: Secondary | ICD-10-CM | POA: Diagnosis not present

## 2022-02-10 DIAGNOSIS — I5031 Acute diastolic (congestive) heart failure: Secondary | ICD-10-CM | POA: Diagnosis not present

## 2022-02-10 DIAGNOSIS — I482 Chronic atrial fibrillation, unspecified: Secondary | ICD-10-CM | POA: Diagnosis not present

## 2022-02-10 DIAGNOSIS — E119 Type 2 diabetes mellitus without complications: Secondary | ICD-10-CM | POA: Diagnosis not present

## 2022-02-10 DIAGNOSIS — M1990 Unspecified osteoarthritis, unspecified site: Secondary | ICD-10-CM | POA: Diagnosis not present

## 2022-02-10 DIAGNOSIS — Z87440 Personal history of urinary (tract) infections: Secondary | ICD-10-CM | POA: Diagnosis not present

## 2022-02-10 DIAGNOSIS — E1122 Type 2 diabetes mellitus with diabetic chronic kidney disease: Secondary | ICD-10-CM | POA: Diagnosis not present

## 2022-02-10 DIAGNOSIS — I130 Hypertensive heart and chronic kidney disease with heart failure and stage 1 through stage 4 chronic kidney disease, or unspecified chronic kidney disease: Secondary | ICD-10-CM | POA: Diagnosis not present

## 2022-02-10 DIAGNOSIS — M170 Bilateral primary osteoarthritis of knee: Secondary | ICD-10-CM | POA: Diagnosis not present

## 2022-02-10 DIAGNOSIS — I10 Essential (primary) hypertension: Secondary | ICD-10-CM | POA: Diagnosis not present

## 2022-02-12 DIAGNOSIS — E1122 Type 2 diabetes mellitus with diabetic chronic kidney disease: Secondary | ICD-10-CM | POA: Diagnosis not present

## 2022-02-12 DIAGNOSIS — I482 Chronic atrial fibrillation, unspecified: Secondary | ICD-10-CM | POA: Diagnosis not present

## 2022-02-12 DIAGNOSIS — M1990 Unspecified osteoarthritis, unspecified site: Secondary | ICD-10-CM | POA: Diagnosis not present

## 2022-02-12 DIAGNOSIS — I5031 Acute diastolic (congestive) heart failure: Secondary | ICD-10-CM | POA: Diagnosis not present

## 2022-02-12 DIAGNOSIS — I130 Hypertensive heart and chronic kidney disease with heart failure and stage 1 through stage 4 chronic kidney disease, or unspecified chronic kidney disease: Secondary | ICD-10-CM | POA: Diagnosis not present

## 2022-02-14 DIAGNOSIS — I130 Hypertensive heart and chronic kidney disease with heart failure and stage 1 through stage 4 chronic kidney disease, or unspecified chronic kidney disease: Secondary | ICD-10-CM | POA: Diagnosis not present

## 2022-02-14 DIAGNOSIS — I5031 Acute diastolic (congestive) heart failure: Secondary | ICD-10-CM | POA: Diagnosis not present

## 2022-02-14 DIAGNOSIS — M1990 Unspecified osteoarthritis, unspecified site: Secondary | ICD-10-CM | POA: Diagnosis not present

## 2022-02-14 DIAGNOSIS — I482 Chronic atrial fibrillation, unspecified: Secondary | ICD-10-CM | POA: Diagnosis not present

## 2022-02-14 DIAGNOSIS — E1122 Type 2 diabetes mellitus with diabetic chronic kidney disease: Secondary | ICD-10-CM | POA: Diagnosis not present

## 2022-02-17 DIAGNOSIS — R278 Other lack of coordination: Secondary | ICD-10-CM | POA: Diagnosis not present

## 2022-02-17 DIAGNOSIS — E1122 Type 2 diabetes mellitus with diabetic chronic kidney disease: Secondary | ICD-10-CM | POA: Diagnosis not present

## 2022-02-17 DIAGNOSIS — M6281 Muscle weakness (generalized): Secondary | ICD-10-CM | POA: Diagnosis not present

## 2022-02-17 DIAGNOSIS — I482 Chronic atrial fibrillation, unspecified: Secondary | ICD-10-CM | POA: Diagnosis not present

## 2022-02-17 DIAGNOSIS — I130 Hypertensive heart and chronic kidney disease with heart failure and stage 1 through stage 4 chronic kidney disease, or unspecified chronic kidney disease: Secondary | ICD-10-CM | POA: Diagnosis not present

## 2022-02-17 DIAGNOSIS — M1990 Unspecified osteoarthritis, unspecified site: Secondary | ICD-10-CM | POA: Diagnosis not present

## 2022-02-17 DIAGNOSIS — I5031 Acute diastolic (congestive) heart failure: Secondary | ICD-10-CM | POA: Diagnosis not present

## 2022-02-17 DIAGNOSIS — R296 Repeated falls: Secondary | ICD-10-CM | POA: Diagnosis not present

## 2022-02-19 DIAGNOSIS — I130 Hypertensive heart and chronic kidney disease with heart failure and stage 1 through stage 4 chronic kidney disease, or unspecified chronic kidney disease: Secondary | ICD-10-CM | POA: Diagnosis not present

## 2022-02-19 DIAGNOSIS — I5031 Acute diastolic (congestive) heart failure: Secondary | ICD-10-CM | POA: Diagnosis not present

## 2022-02-19 DIAGNOSIS — M1990 Unspecified osteoarthritis, unspecified site: Secondary | ICD-10-CM | POA: Diagnosis not present

## 2022-02-19 DIAGNOSIS — E1122 Type 2 diabetes mellitus with diabetic chronic kidney disease: Secondary | ICD-10-CM | POA: Diagnosis not present

## 2022-02-19 DIAGNOSIS — I482 Chronic atrial fibrillation, unspecified: Secondary | ICD-10-CM | POA: Diagnosis not present

## 2022-02-21 DIAGNOSIS — I5031 Acute diastolic (congestive) heart failure: Secondary | ICD-10-CM | POA: Diagnosis not present

## 2022-02-21 DIAGNOSIS — E1122 Type 2 diabetes mellitus with diabetic chronic kidney disease: Secondary | ICD-10-CM | POA: Diagnosis not present

## 2022-02-21 DIAGNOSIS — I482 Chronic atrial fibrillation, unspecified: Secondary | ICD-10-CM | POA: Diagnosis not present

## 2022-02-21 DIAGNOSIS — M1990 Unspecified osteoarthritis, unspecified site: Secondary | ICD-10-CM | POA: Diagnosis not present

## 2022-02-21 DIAGNOSIS — I130 Hypertensive heart and chronic kidney disease with heart failure and stage 1 through stage 4 chronic kidney disease, or unspecified chronic kidney disease: Secondary | ICD-10-CM | POA: Diagnosis not present

## 2022-02-24 DIAGNOSIS — M2041 Other hammer toe(s) (acquired), right foot: Secondary | ICD-10-CM | POA: Diagnosis not present

## 2022-02-24 DIAGNOSIS — E1122 Type 2 diabetes mellitus with diabetic chronic kidney disease: Secondary | ICD-10-CM | POA: Diagnosis not present

## 2022-02-24 DIAGNOSIS — I5031 Acute diastolic (congestive) heart failure: Secondary | ICD-10-CM | POA: Diagnosis not present

## 2022-02-24 DIAGNOSIS — M62838 Other muscle spasm: Secondary | ICD-10-CM | POA: Diagnosis not present

## 2022-02-24 DIAGNOSIS — I130 Hypertensive heart and chronic kidney disease with heart failure and stage 1 through stage 4 chronic kidney disease, or unspecified chronic kidney disease: Secondary | ICD-10-CM | POA: Diagnosis not present

## 2022-02-24 DIAGNOSIS — Q667 Congenital pes cavus, unspecified foot: Secondary | ICD-10-CM | POA: Diagnosis not present

## 2022-02-24 DIAGNOSIS — M79662 Pain in left lower leg: Secondary | ICD-10-CM | POA: Diagnosis not present

## 2022-02-24 DIAGNOSIS — M1990 Unspecified osteoarthritis, unspecified site: Secondary | ICD-10-CM | POA: Diagnosis not present

## 2022-02-24 DIAGNOSIS — I482 Chronic atrial fibrillation, unspecified: Secondary | ICD-10-CM | POA: Diagnosis not present

## 2022-02-24 DIAGNOSIS — B351 Tinea unguium: Secondary | ICD-10-CM | POA: Diagnosis not present

## 2022-02-24 DIAGNOSIS — M2042 Other hammer toe(s) (acquired), left foot: Secondary | ICD-10-CM | POA: Diagnosis not present

## 2022-02-24 DIAGNOSIS — M79661 Pain in right lower leg: Secondary | ICD-10-CM | POA: Diagnosis not present

## 2022-02-26 DIAGNOSIS — N390 Urinary tract infection, site not specified: Secondary | ICD-10-CM | POA: Diagnosis not present

## 2022-02-27 DIAGNOSIS — E559 Vitamin D deficiency, unspecified: Secondary | ICD-10-CM | POA: Diagnosis not present

## 2022-02-27 DIAGNOSIS — Z79899 Other long term (current) drug therapy: Secondary | ICD-10-CM | POA: Diagnosis not present

## 2022-03-04 DIAGNOSIS — M1990 Unspecified osteoarthritis, unspecified site: Secondary | ICD-10-CM | POA: Diagnosis not present

## 2022-03-04 DIAGNOSIS — E1122 Type 2 diabetes mellitus with diabetic chronic kidney disease: Secondary | ICD-10-CM | POA: Diagnosis not present

## 2022-03-04 DIAGNOSIS — I5031 Acute diastolic (congestive) heart failure: Secondary | ICD-10-CM | POA: Diagnosis not present

## 2022-03-04 DIAGNOSIS — I482 Chronic atrial fibrillation, unspecified: Secondary | ICD-10-CM | POA: Diagnosis not present

## 2022-03-04 DIAGNOSIS — I130 Hypertensive heart and chronic kidney disease with heart failure and stage 1 through stage 4 chronic kidney disease, or unspecified chronic kidney disease: Secondary | ICD-10-CM | POA: Diagnosis not present

## 2022-03-05 DIAGNOSIS — M1990 Unspecified osteoarthritis, unspecified site: Secondary | ICD-10-CM | POA: Diagnosis not present

## 2022-03-05 DIAGNOSIS — I130 Hypertensive heart and chronic kidney disease with heart failure and stage 1 through stage 4 chronic kidney disease, or unspecified chronic kidney disease: Secondary | ICD-10-CM | POA: Diagnosis not present

## 2022-03-05 DIAGNOSIS — I5031 Acute diastolic (congestive) heart failure: Secondary | ICD-10-CM | POA: Diagnosis not present

## 2022-03-05 DIAGNOSIS — E1122 Type 2 diabetes mellitus with diabetic chronic kidney disease: Secondary | ICD-10-CM | POA: Diagnosis not present

## 2022-03-05 DIAGNOSIS — I482 Chronic atrial fibrillation, unspecified: Secondary | ICD-10-CM | POA: Diagnosis not present

## 2022-03-07 DIAGNOSIS — I482 Chronic atrial fibrillation, unspecified: Secondary | ICD-10-CM | POA: Diagnosis not present

## 2022-03-07 DIAGNOSIS — E1122 Type 2 diabetes mellitus with diabetic chronic kidney disease: Secondary | ICD-10-CM | POA: Diagnosis not present

## 2022-03-07 DIAGNOSIS — M1990 Unspecified osteoarthritis, unspecified site: Secondary | ICD-10-CM | POA: Diagnosis not present

## 2022-03-07 DIAGNOSIS — I5031 Acute diastolic (congestive) heart failure: Secondary | ICD-10-CM | POA: Diagnosis not present

## 2022-03-07 DIAGNOSIS — I130 Hypertensive heart and chronic kidney disease with heart failure and stage 1 through stage 4 chronic kidney disease, or unspecified chronic kidney disease: Secondary | ICD-10-CM | POA: Diagnosis not present

## 2022-03-10 DIAGNOSIS — I482 Chronic atrial fibrillation, unspecified: Secondary | ICD-10-CM | POA: Diagnosis not present

## 2022-03-10 DIAGNOSIS — E119 Type 2 diabetes mellitus without complications: Secondary | ICD-10-CM | POA: Diagnosis not present

## 2022-03-10 DIAGNOSIS — N390 Urinary tract infection, site not specified: Secondary | ICD-10-CM | POA: Diagnosis not present

## 2022-03-10 DIAGNOSIS — E1122 Type 2 diabetes mellitus with diabetic chronic kidney disease: Secondary | ICD-10-CM | POA: Diagnosis not present

## 2022-03-10 DIAGNOSIS — R2689 Other abnormalities of gait and mobility: Secondary | ICD-10-CM | POA: Diagnosis not present

## 2022-03-10 DIAGNOSIS — M1990 Unspecified osteoarthritis, unspecified site: Secondary | ICD-10-CM | POA: Diagnosis not present

## 2022-03-10 DIAGNOSIS — R296 Repeated falls: Secondary | ICD-10-CM | POA: Diagnosis not present

## 2022-03-10 DIAGNOSIS — I5031 Acute diastolic (congestive) heart failure: Secondary | ICD-10-CM | POA: Diagnosis not present

## 2022-03-10 DIAGNOSIS — N3281 Overactive bladder: Secondary | ICD-10-CM | POA: Diagnosis not present

## 2022-03-10 DIAGNOSIS — I130 Hypertensive heart and chronic kidney disease with heart failure and stage 1 through stage 4 chronic kidney disease, or unspecified chronic kidney disease: Secondary | ICD-10-CM | POA: Diagnosis not present

## 2022-03-17 DIAGNOSIS — H353131 Nonexudative age-related macular degeneration, bilateral, early dry stage: Secondary | ICD-10-CM | POA: Diagnosis not present

## 2022-03-17 DIAGNOSIS — H43813 Vitreous degeneration, bilateral: Secondary | ICD-10-CM | POA: Diagnosis not present

## 2022-04-03 DIAGNOSIS — G20 Parkinson's disease: Secondary | ICD-10-CM | POA: Diagnosis not present

## 2022-04-03 DIAGNOSIS — I10 Essential (primary) hypertension: Secondary | ICD-10-CM | POA: Diagnosis not present

## 2022-04-08 DIAGNOSIS — G20 Parkinson's disease: Secondary | ICD-10-CM | POA: Diagnosis not present

## 2022-04-08 DIAGNOSIS — M170 Bilateral primary osteoarthritis of knee: Secondary | ICD-10-CM | POA: Diagnosis not present

## 2022-04-08 DIAGNOSIS — I10 Essential (primary) hypertension: Secondary | ICD-10-CM | POA: Diagnosis not present

## 2022-04-08 DIAGNOSIS — I5042 Chronic combined systolic (congestive) and diastolic (congestive) heart failure: Secondary | ICD-10-CM | POA: Diagnosis not present

## 2022-04-29 DIAGNOSIS — N3281 Overactive bladder: Secondary | ICD-10-CM | POA: Diagnosis not present

## 2022-04-29 DIAGNOSIS — E119 Type 2 diabetes mellitus without complications: Secondary | ICD-10-CM | POA: Diagnosis not present

## 2022-04-29 DIAGNOSIS — R2689 Other abnormalities of gait and mobility: Secondary | ICD-10-CM | POA: Diagnosis not present

## 2022-05-05 DIAGNOSIS — M79662 Pain in left lower leg: Secondary | ICD-10-CM | POA: Diagnosis not present

## 2022-05-05 DIAGNOSIS — M2011 Hallux valgus (acquired), right foot: Secondary | ICD-10-CM | POA: Diagnosis not present

## 2022-05-05 DIAGNOSIS — M79661 Pain in right lower leg: Secondary | ICD-10-CM | POA: Diagnosis not present

## 2022-05-05 DIAGNOSIS — L602 Onychogryphosis: Secondary | ICD-10-CM | POA: Diagnosis not present

## 2022-05-05 DIAGNOSIS — B351 Tinea unguium: Secondary | ICD-10-CM | POA: Diagnosis not present

## 2022-05-05 DIAGNOSIS — M2041 Other hammer toe(s) (acquired), right foot: Secondary | ICD-10-CM | POA: Diagnosis not present

## 2022-05-09 DIAGNOSIS — G3184 Mild cognitive impairment, so stated: Secondary | ICD-10-CM | POA: Diagnosis not present

## 2022-05-09 DIAGNOSIS — G8929 Other chronic pain: Secondary | ICD-10-CM | POA: Diagnosis not present

## 2022-05-17 DIAGNOSIS — G8929 Other chronic pain: Secondary | ICD-10-CM | POA: Diagnosis not present

## 2022-05-17 DIAGNOSIS — G3184 Mild cognitive impairment, so stated: Secondary | ICD-10-CM | POA: Diagnosis not present

## 2022-05-24 DIAGNOSIS — G8929 Other chronic pain: Secondary | ICD-10-CM | POA: Diagnosis not present

## 2022-05-24 DIAGNOSIS — G3184 Mild cognitive impairment, so stated: Secondary | ICD-10-CM | POA: Diagnosis not present

## 2022-05-26 DIAGNOSIS — I10 Essential (primary) hypertension: Secondary | ICD-10-CM | POA: Diagnosis not present

## 2022-05-26 DIAGNOSIS — R413 Other amnesia: Secondary | ICD-10-CM | POA: Diagnosis not present

## 2022-05-26 DIAGNOSIS — I5042 Chronic combined systolic (congestive) and diastolic (congestive) heart failure: Secondary | ICD-10-CM | POA: Diagnosis not present

## 2022-05-26 DIAGNOSIS — M170 Bilateral primary osteoarthritis of knee: Secondary | ICD-10-CM | POA: Diagnosis not present

## 2022-05-30 DIAGNOSIS — G8929 Other chronic pain: Secondary | ICD-10-CM | POA: Diagnosis not present

## 2022-05-30 DIAGNOSIS — G3184 Mild cognitive impairment, so stated: Secondary | ICD-10-CM | POA: Diagnosis not present

## 2022-06-04 DIAGNOSIS — Z888 Allergy status to other drugs, medicaments and biological substances status: Secondary | ICD-10-CM | POA: Diagnosis not present

## 2022-06-04 DIAGNOSIS — R0602 Shortness of breath: Secondary | ICD-10-CM | POA: Diagnosis not present

## 2022-06-04 DIAGNOSIS — R079 Chest pain, unspecified: Secondary | ICD-10-CM | POA: Diagnosis not present

## 2022-06-04 DIAGNOSIS — R0789 Other chest pain: Secondary | ICD-10-CM | POA: Diagnosis not present

## 2022-06-04 DIAGNOSIS — J40 Bronchitis, not specified as acute or chronic: Secondary | ICD-10-CM | POA: Diagnosis not present

## 2022-06-04 DIAGNOSIS — Z886 Allergy status to analgesic agent status: Secondary | ICD-10-CM | POA: Diagnosis not present

## 2022-06-04 DIAGNOSIS — R059 Cough, unspecified: Secondary | ICD-10-CM | POA: Diagnosis not present

## 2022-06-04 DIAGNOSIS — Z881 Allergy status to other antibiotic agents status: Secondary | ICD-10-CM | POA: Diagnosis not present

## 2022-06-04 DIAGNOSIS — Z20822 Contact with and (suspected) exposure to covid-19: Secondary | ICD-10-CM | POA: Diagnosis not present

## 2022-06-04 DIAGNOSIS — Z882 Allergy status to sulfonamides status: Secondary | ICD-10-CM | POA: Diagnosis not present

## 2022-06-06 DIAGNOSIS — R059 Cough, unspecified: Secondary | ICD-10-CM | POA: Diagnosis not present

## 2022-06-10 DIAGNOSIS — G20 Parkinson's disease: Secondary | ICD-10-CM | POA: Diagnosis not present

## 2022-06-10 DIAGNOSIS — G3184 Mild cognitive impairment, so stated: Secondary | ICD-10-CM | POA: Diagnosis not present

## 2022-06-10 DIAGNOSIS — R059 Cough, unspecified: Secondary | ICD-10-CM | POA: Diagnosis not present

## 2022-06-10 DIAGNOSIS — R2689 Other abnormalities of gait and mobility: Secondary | ICD-10-CM | POA: Diagnosis not present

## 2022-06-13 DIAGNOSIS — Z79891 Long term (current) use of opiate analgesic: Secondary | ICD-10-CM | POA: Diagnosis not present

## 2022-06-13 DIAGNOSIS — K7290 Hepatic failure, unspecified without coma: Secondary | ICD-10-CM | POA: Diagnosis not present

## 2022-06-13 DIAGNOSIS — G809 Cerebral palsy, unspecified: Secondary | ICD-10-CM | POA: Diagnosis not present

## 2022-06-13 DIAGNOSIS — Z8673 Personal history of transient ischemic attack (TIA), and cerebral infarction without residual deficits: Secondary | ICD-10-CM | POA: Diagnosis not present

## 2022-06-13 DIAGNOSIS — G20 Parkinson's disease: Secondary | ICD-10-CM | POA: Diagnosis not present

## 2022-06-13 DIAGNOSIS — M5431 Sciatica, right side: Secondary | ICD-10-CM | POA: Diagnosis not present

## 2022-06-13 DIAGNOSIS — N3281 Overactive bladder: Secondary | ICD-10-CM | POA: Diagnosis not present

## 2022-06-13 DIAGNOSIS — B379 Candidiasis, unspecified: Secondary | ICD-10-CM | POA: Diagnosis not present

## 2022-06-13 DIAGNOSIS — E039 Hypothyroidism, unspecified: Secondary | ICD-10-CM | POA: Diagnosis not present

## 2022-06-13 DIAGNOSIS — I5030 Unspecified diastolic (congestive) heart failure: Secondary | ICD-10-CM | POA: Diagnosis not present

## 2022-06-13 DIAGNOSIS — N183 Chronic kidney disease, stage 3 unspecified: Secondary | ICD-10-CM | POA: Diagnosis not present

## 2022-06-13 DIAGNOSIS — Z7984 Long term (current) use of oral hypoglycemic drugs: Secondary | ICD-10-CM | POA: Diagnosis not present

## 2022-06-13 DIAGNOSIS — J40 Bronchitis, not specified as acute or chronic: Secondary | ICD-10-CM | POA: Diagnosis not present

## 2022-06-13 DIAGNOSIS — I480 Paroxysmal atrial fibrillation: Secondary | ICD-10-CM | POA: Diagnosis not present

## 2022-06-13 DIAGNOSIS — K219 Gastro-esophageal reflux disease without esophagitis: Secondary | ICD-10-CM | POA: Diagnosis not present

## 2022-06-13 DIAGNOSIS — G3184 Mild cognitive impairment, so stated: Secondary | ICD-10-CM | POA: Diagnosis not present

## 2022-06-13 DIAGNOSIS — E1122 Type 2 diabetes mellitus with diabetic chronic kidney disease: Secondary | ICD-10-CM | POA: Diagnosis not present

## 2022-06-13 DIAGNOSIS — E785 Hyperlipidemia, unspecified: Secondary | ICD-10-CM | POA: Diagnosis not present

## 2022-06-13 DIAGNOSIS — Z7901 Long term (current) use of anticoagulants: Secondary | ICD-10-CM | POA: Diagnosis not present

## 2022-06-13 DIAGNOSIS — I130 Hypertensive heart and chronic kidney disease with heart failure and stage 1 through stage 4 chronic kidney disease, or unspecified chronic kidney disease: Secondary | ICD-10-CM | POA: Diagnosis not present

## 2022-06-24 DIAGNOSIS — I110 Hypertensive heart disease with heart failure: Secondary | ICD-10-CM | POA: Diagnosis not present

## 2022-06-24 DIAGNOSIS — I2510 Atherosclerotic heart disease of native coronary artery without angina pectoris: Secondary | ICD-10-CM | POA: Diagnosis not present

## 2022-06-24 DIAGNOSIS — I5033 Acute on chronic diastolic (congestive) heart failure: Secondary | ICD-10-CM | POA: Diagnosis not present

## 2022-06-24 DIAGNOSIS — I4891 Unspecified atrial fibrillation: Secondary | ICD-10-CM | POA: Diagnosis not present

## 2022-06-24 DIAGNOSIS — Z79899 Other long term (current) drug therapy: Secondary | ICD-10-CM | POA: Diagnosis not present

## 2022-06-24 DIAGNOSIS — N179 Acute kidney failure, unspecified: Secondary | ICD-10-CM | POA: Diagnosis not present

## 2022-06-24 DIAGNOSIS — I517 Cardiomegaly: Secondary | ICD-10-CM | POA: Diagnosis not present

## 2022-06-24 DIAGNOSIS — R600 Localized edema: Secondary | ICD-10-CM | POA: Diagnosis not present

## 2022-06-24 DIAGNOSIS — E162 Hypoglycemia, unspecified: Secondary | ICD-10-CM | POA: Diagnosis not present

## 2022-06-24 DIAGNOSIS — M25562 Pain in left knee: Secondary | ICD-10-CM | POA: Diagnosis not present

## 2022-06-24 DIAGNOSIS — A419 Sepsis, unspecified organism: Secondary | ICD-10-CM | POA: Diagnosis not present

## 2022-06-24 DIAGNOSIS — N3090 Cystitis, unspecified without hematuria: Secondary | ICD-10-CM | POA: Diagnosis not present

## 2022-06-24 DIAGNOSIS — Z66 Do not resuscitate: Secondary | ICD-10-CM | POA: Diagnosis not present

## 2022-06-24 DIAGNOSIS — N390 Urinary tract infection, site not specified: Secondary | ICD-10-CM | POA: Diagnosis not present

## 2022-06-24 DIAGNOSIS — E782 Mixed hyperlipidemia: Secondary | ICD-10-CM | POA: Diagnosis not present

## 2022-06-24 DIAGNOSIS — I5041 Acute combined systolic (congestive) and diastolic (congestive) heart failure: Secondary | ICD-10-CM | POA: Diagnosis not present

## 2022-06-24 DIAGNOSIS — N3000 Acute cystitis without hematuria: Secondary | ICD-10-CM | POA: Diagnosis not present

## 2022-06-24 DIAGNOSIS — I10 Essential (primary) hypertension: Secondary | ICD-10-CM | POA: Diagnosis not present

## 2022-06-24 DIAGNOSIS — Z20822 Contact with and (suspected) exposure to covid-19: Secondary | ICD-10-CM | POA: Diagnosis not present

## 2022-06-24 DIAGNOSIS — R519 Headache, unspecified: Secondary | ICD-10-CM | POA: Diagnosis not present

## 2022-06-24 DIAGNOSIS — S8992XA Unspecified injury of left lower leg, initial encounter: Secondary | ICD-10-CM | POA: Diagnosis not present

## 2022-06-24 DIAGNOSIS — I083 Combined rheumatic disorders of mitral, aortic and tricuspid valves: Secondary | ICD-10-CM | POA: Diagnosis not present

## 2022-06-24 DIAGNOSIS — E11649 Type 2 diabetes mellitus with hypoglycemia without coma: Secondary | ICD-10-CM | POA: Diagnosis not present

## 2022-06-24 DIAGNOSIS — N183 Chronic kidney disease, stage 3 unspecified: Secondary | ICD-10-CM | POA: Diagnosis not present

## 2022-06-24 DIAGNOSIS — Z96653 Presence of artificial knee joint, bilateral: Secondary | ICD-10-CM | POA: Diagnosis not present

## 2022-06-24 DIAGNOSIS — Z8673 Personal history of transient ischemic attack (TIA), and cerebral infarction without residual deficits: Secondary | ICD-10-CM | POA: Diagnosis not present

## 2022-06-24 DIAGNOSIS — E1122 Type 2 diabetes mellitus with diabetic chronic kidney disease: Secondary | ICD-10-CM | POA: Diagnosis not present

## 2022-06-24 DIAGNOSIS — S0990XA Unspecified injury of head, initial encounter: Secondary | ICD-10-CM | POA: Diagnosis not present

## 2022-06-24 DIAGNOSIS — R918 Other nonspecific abnormal finding of lung field: Secondary | ICD-10-CM | POA: Diagnosis not present

## 2022-06-24 DIAGNOSIS — I482 Chronic atrial fibrillation, unspecified: Secondary | ICD-10-CM | POA: Diagnosis not present

## 2022-06-24 DIAGNOSIS — I130 Hypertensive heart and chronic kidney disease with heart failure and stage 1 through stage 4 chronic kidney disease, or unspecified chronic kidney disease: Secondary | ICD-10-CM | POA: Diagnosis not present

## 2022-06-24 DIAGNOSIS — R509 Fever, unspecified: Secondary | ICD-10-CM | POA: Diagnosis not present

## 2022-06-24 DIAGNOSIS — G20 Parkinson's disease: Secondary | ICD-10-CM | POA: Diagnosis not present

## 2022-06-24 DIAGNOSIS — E039 Hypothyroidism, unspecified: Secondary | ICD-10-CM | POA: Diagnosis not present

## 2022-06-24 DIAGNOSIS — Z7901 Long term (current) use of anticoagulants: Secondary | ICD-10-CM | POA: Diagnosis not present

## 2022-07-18 DEATH — deceased
